# Patient Record
Sex: Male | Born: 1990
Health system: Southern US, Community
[De-identification: ages and names within clinical notes are randomized; demographics above are authoritative.]

## PROBLEM LIST (undated history)

## (undated) DIAGNOSIS — I2510 Atherosclerotic heart disease of native coronary artery without angina pectoris: Secondary | ICD-10-CM

## (undated) DIAGNOSIS — L400 Psoriasis vulgaris: Secondary | ICD-10-CM

## (undated) DIAGNOSIS — I10 Essential (primary) hypertension: Secondary | ICD-10-CM

## (undated) DIAGNOSIS — Z952 Presence of prosthetic heart valve: Secondary | ICD-10-CM

## (undated) DIAGNOSIS — E785 Hyperlipidemia, unspecified: Secondary | ICD-10-CM

## (undated) DIAGNOSIS — T7840XA Allergy, unspecified, initial encounter: Secondary | ICD-10-CM

## (undated) DIAGNOSIS — I3139 Other pericardial effusion (noninflammatory): Secondary | ICD-10-CM

## (undated) DIAGNOSIS — F419 Anxiety disorder, unspecified: Secondary | ICD-10-CM

## (undated) DIAGNOSIS — I719 Aortic aneurysm of unspecified site, without rupture: Secondary | ICD-10-CM

## (undated) DIAGNOSIS — I313 Pericardial effusion (noninflammatory): Secondary | ICD-10-CM

## (undated) HISTORY — DX: Hyperlipidemia, unspecified: E78.5

## (undated) HISTORY — DX: Other pericardial effusion (noninflammatory): I31.39

## (undated) HISTORY — PX: WISDOM TOOTH EXTRACTION: SHX21

## (undated) HISTORY — DX: Atherosclerotic heart disease of native coronary artery without angina pectoris: I25.10

## (undated) HISTORY — DX: Essential (primary) hypertension: I10

## (undated) HISTORY — PX: CLEFT LIP REPAIR: SHX5225

## (undated) HISTORY — DX: Anxiety disorder, unspecified: F41.9

## (undated) HISTORY — DX: Presence of prosthetic heart valve: Z95.2

## (undated) HISTORY — DX: Allergy, unspecified, initial encounter: T78.40XA

## (undated) HISTORY — DX: Pericardial effusion (noninflammatory): I31.3

## (undated) HISTORY — PX: CARDIAC SURGERY: SHX584

---
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
## 2012-07-26 ENCOUNTER — Emergency Department: Payer: Self-pay | Admitting: Emergency Medicine

## 2012-12-05 ENCOUNTER — Ambulatory Visit: Payer: Self-pay | Admitting: Gastroenterology

## 2012-12-07 LAB — PATHOLOGY REPORT

## 2013-03-29 ENCOUNTER — Ambulatory Visit: Payer: Self-pay | Admitting: Gastroenterology

## 2013-05-11 ENCOUNTER — Ambulatory Visit: Payer: Self-pay | Admitting: Gastroenterology

## 2013-06-30 ENCOUNTER — Emergency Department: Payer: Self-pay | Admitting: Emergency Medicine

## 2013-06-30 LAB — BASIC METABOLIC PANEL
Anion Gap: 11 (ref 7–16)
BUN: 8 mg/dL (ref 7–18)
Calcium, Total: 8.3 mg/dL — ABNORMAL LOW (ref 8.5–10.1)
Chloride: 105 mmol/L (ref 98–107)
Co2: 24 mmol/L (ref 21–32)
Creatinine: 0.96 mg/dL (ref 0.60–1.30)
EGFR (African American): >60
EGFR (Non-African Amer.): >60
Glucose: 156 mg/dL — ABNORMAL HIGH (ref 65–99)
Osmolality: 281 (ref 275–301)
Potassium: 3.4 mmol/L — ABNORMAL LOW (ref 3.5–5.1)
Sodium: 140 mmol/L (ref 136–145)

## 2013-06-30 LAB — CBC WITH DIFFERENTIAL/PLATELET
Basophil #: 0.1 10*3/uL (ref 0.0–0.1)
Basophil %: 0.5 %
Eosinophil #: 0.2 10*3/uL (ref 0.0–0.7)
Eosinophil %: 1.2 %
HCT: 50 % (ref 40.0–52.0)
HGB: 16.9 g/dL (ref 13.0–18.0)
Lymphocyte #: 4.8 10*3/uL — ABNORMAL HIGH (ref 1.0–3.6)
Lymphocyte %: 34.2 %
MCH: 31.2 pg (ref 26.0–34.0)
MCHC: 33.9 g/dL (ref 32.0–36.0)
MCV: 92 fL (ref 80–100)
Monocyte #: 1.1 x10 3/mm — ABNORMAL HIGH (ref 0.2–1.0)
Monocyte %: 7.8 %
Neutrophil #: 7.9 10*3/uL — ABNORMAL HIGH (ref 1.4–6.5)
Neutrophil %: 56.3 %
Platelet: 263 10*3/uL (ref 150–440)
RBC: 5.42 10*6/uL (ref 4.40–5.90)
RDW: 14 % (ref 11.5–14.5)
WBC: 14 10*3/uL — ABNORMAL HIGH (ref 3.8–10.6)

## 2014-11-12 ENCOUNTER — Ambulatory Visit (INDEPENDENT_AMBULATORY_CARE_PROVIDER_SITE_OTHER): Payer: 59 | Admitting: Unknown Physician Specialty

## 2014-11-12 ENCOUNTER — Encounter: Payer: Self-pay | Admitting: Unknown Physician Specialty

## 2014-11-12 VITALS — BP 123/87 | HR 112 | Temp 97.8°F | Ht 64.7 in | Wt 172.2 lb

## 2014-11-12 DIAGNOSIS — H6121 Impacted cerumen, right ear: Secondary | ICD-10-CM | POA: Diagnosis not present

## 2014-11-12 NOTE — Patient Instructions (Signed)
Cerumen Impaction °A cerumen impaction is when the wax in your ear forms a plug. This plug usually causes reduced hearing. Sometimes it also causes an earache or dizziness. Removing a cerumen impaction can be difficult and painful. The wax sticks to the ear canal. The canal is sensitive and bleeds easily. If you try to remove a heavy wax buildup with a cotton tipped swab, you may push it in further. °Irrigation with water, suction, and small ear curettes may be used to clear out the wax. If the impaction is fixed to the skin in the ear canal, ear drops may be needed for a few days to loosen the wax. People who build up a lot of wax frequently can use ear wax removal products available in your local drugstore. °SEEK MEDICAL CARE IF:  °You develop an earache, increased hearing loss, or marked dizziness. °Document Released: 06/18/2004 Document Revised: 08/03/2011 Document Reviewed: 08/08/2009 °ExitCare® Patient Information ©2015 [employer]. This information is not intended to replace advice given to you by your health care provider. Make sure you discuss any questions you have with your health care provider. ° °

## 2014-11-12 NOTE — Progress Notes (Signed)
   BP 123/87 mmHg  Pulse 112  Temp(Src) 97.8 F (36.6 C)  Ht 5' 4.7" (1.643 m)  Wt 172 lb 3.2 oz (78.109 kg)  BMI 28.94 kg/m2  SpO2 100%   Subjective:    Patient ID: Henry Duncan, male    DOB: 1991/03/23, 24 y.o.   MRN: [id number]  HPI: Henry Duncan is a 24 y.o. male  Chief Complaint  Patient presents with  . Ear Pain    pt states pain is in right ear  . Insomnia   Otalgia  There is pain in the right ear. Chronicity: for onemonth. The problem occurs constantly. The problem has been gradually worsening. There has been no fever. Associated symptoms include rhinorrhea. Pertinent negatives include no coughing or sore throat. Treatments tried: q tips.     Relevant past medical, surgical, family and social history reviewed and updated as indicated. Interim medical history since our last visit reviewed. Allergies and medications reviewed and updated.  Review of Systems  HENT: Positive for ear pain and rhinorrhea. Negative for sore throat.   Respiratory: Negative for cough.     Per HPI unless specifically indicated above     Objective:    BP 123/87 mmHg  Pulse 112  Temp(Src) 97.8 F (36.6 C)  Ht 5' 4.7" (1.643 m)  Wt 172 lb 3.2 oz (78.109 kg)  BMI 28.94 kg/m2  SpO2 100%  Wt Readings from Last 3 Encounters:  11/12/14 172 lb 3.2 oz (78.109 kg)  12/18/13 166 lb (75.297 kg)    Physical Exam  Constitutional: He is oriented to person, place, and time. He appears well-developed and well-nourished. No distress.  HENT:  Head: Normocephalic and atraumatic.  significant ceruman right ear.  Unable to irrigate without a lot of pain  Eyes: Conjunctivae and lids are normal. Right eye exhibits no discharge. Left eye exhibits no discharge. No scleral icterus.  Cardiovascular: Normal rate and regular rhythm.   Pulmonary/Chest: Effort normal. No respiratory distress.  Abdominal: Normal appearance and bowel sounds are normal. He exhibits no distension. There is no  splenomegaly or hepatomegaly. There is no tenderness.  Musculoskeletal: Normal range of motion.  Neurological: He is alert and oriented to person, place, and time.  Skin: Skin is intact. No rash noted. No pallor.  Psychiatric: He has a normal mood and affect. His behavior is normal. Judgment and thought content normal.     Assessment & Plan:   Problem List Items Addressed This Visit    None    Visit Diagnoses    Cerumen impaction, right    -  Primary        Follow up plan: Return in about 4 days (around 11/16/2014) for irrigation. after using debrox or baby oil

## 2014-11-16 ENCOUNTER — Encounter: Payer: Self-pay | Admitting: Unknown Physician Specialty

## 2014-11-16 ENCOUNTER — Ambulatory Visit (INDEPENDENT_AMBULATORY_CARE_PROVIDER_SITE_OTHER): Payer: 59 | Admitting: Unknown Physician Specialty

## 2014-11-16 VITALS — BP 135/92 | HR 102 | Temp 98.1°F | Wt 177.6 lb

## 2014-11-16 DIAGNOSIS — H6123 Impacted cerumen, bilateral: Secondary | ICD-10-CM

## 2014-11-16 NOTE — Progress Notes (Signed)
   BP 135/92 mmHg  Pulse 102  Temp(Src) 98.1 F (36.7 C) (Oral)  Wt 177 lb 9.6 oz (80.559 kg)  SpO2 96%   Subjective:    Patient ID: Henry Duncan, male    DOB: November 29, 1990, 24 y.o.   MRN: [id number]  HPI: Henry Duncan is a 24 y.o. male  Chief Complaint  Patient presents with  . Follow-up    Ear cleaning    Relevant past medical, surgical, family and social history reviewed and updated as indicated. Interim medical history since our last visit reviewed. Allergies and medications reviewed and updated.  Otalgia  There is pain in the right ear. This is a new (Seen last week and unable to clean out.  Used OTC debrox) problem. The current episode started more than 1 month ago. The problem occurs constantly. The problem has been unchanged.     Review of Systems  HENT: Positive for ear pain.     Per HPI unless specifically indicated above     Objective:    BP 135/92 mmHg  Pulse 102  Temp(Src) 98.1 F (36.7 C) (Oral)  Wt 177 lb 9.6 oz (80.559 kg)  SpO2 96%  Wt Readings from Last 3 Encounters:  11/16/14 177 lb 9.6 oz (80.559 kg)  11/12/14 172 lb 3.2 oz (78.109 kg)  12/18/13 166 lb (75.297 kg)    Physical Exam  Constitutional: He is oriented to person, place, and time. He appears well-developed and well-nourished. No distress.  HENT:  Head: Normocephalic and atraumatic.  Bilateral ceruman irrigated.   Eyes: Conjunctivae and lids are normal. Right eye exhibits no discharge. Left eye exhibits no discharge. No scleral icterus.  Cardiovascular: Normal rate and regular rhythm.   Pulmonary/Chest: Effort normal. No respiratory distress.  Abdominal: Normal appearance. There is no splenomegaly or hepatomegaly.  Musculoskeletal: Normal range of motion.  Neurological: He is alert and oriented to person, place, and time.  Skin: Skin is intact. No rash noted. No pallor.  Psychiatric: He has a normal mood and affect. His behavior is normal. Judgment and thought  content normal.      Assessment & Plan:   Problem List Items Addressed This Visit    None    Visit Diagnoses    Cerumen impaction, bilateral    -  Primary        Follow up plan: No Follow-up on file.

## 2014-12-12 ENCOUNTER — Encounter: Payer: 59 | Admitting: Unknown Physician Specialty

## 2015-02-01 ENCOUNTER — Ambulatory Visit: Payer: 59 | Admitting: Unknown Physician Specialty

## 2015-02-11 ENCOUNTER — Encounter: Payer: Self-pay | Admitting: Unknown Physician Specialty

## 2015-02-11 ENCOUNTER — Ambulatory Visit (INDEPENDENT_AMBULATORY_CARE_PROVIDER_SITE_OTHER): Payer: 59 | Admitting: Unknown Physician Specialty

## 2015-02-11 VITALS — BP 119/85 | HR 93 | Temp 98.6°F | Ht 66.5 in | Wt 176.2 lb

## 2015-02-11 DIAGNOSIS — F419 Anxiety disorder, unspecified: Secondary | ICD-10-CM | POA: Diagnosis not present

## 2015-02-11 DIAGNOSIS — K219 Gastro-esophageal reflux disease without esophagitis: Secondary | ICD-10-CM | POA: Diagnosis not present

## 2015-02-11 MED ORDER — CITALOPRAM HYDROBROMIDE 20 MG PO TABS: 20.0000 mg | ORAL_TABLET | Freq: Every day | ORAL | Status: DC

## 2015-02-11 MED ORDER — OMEPRAZOLE 20 MG PO CPDR: 20.0000 mg | DELAYED_RELEASE_CAPSULE | Freq: Every day | ORAL | Status: DC

## 2015-02-11 NOTE — Assessment & Plan Note (Signed)
Prescribed omeprazole 20 mg po daily H pylori screening ordered results pending CBC ordered results pending Instructed to avoid fatty foods and spicy foods

## 2015-02-11 NOTE — Assessment & Plan Note (Signed)
Prescribed Citalopram 20 mg daily PHQ-9 Depression Screening score today 6 Informed pt it may take up to 2 weeks for medication to improve symptoms

## 2015-02-11 NOTE — Progress Notes (Signed)
BP 119/85 mmHg  Pulse 93  Temp(Src) 98.6 F (37 C)  Ht 5' 6.5" (1.689 m)  Wt 176 lb 3.2 oz (79.924 kg)  BMI 28.02 kg/m2  SpO2 97%   Subjective:    Patient ID: Henry Duncan, male    DOB: 1991-01-26, 24 y.o.   MRN: [id number]  HPI: Henry Duncan is a 24 y.o. male  Chief Complaint  Patient presents with  . Abdominal Pain    pt states he used to have 4 stomach ulcers and has been throwing up for a while now  . Insomnia    pt states he can fall asleep but has trouble staying asleep. States sleep apnea runs in the family.   Abdominal Pain Pt presents to the office today with c/o intermittent 3-4 times a week of vomiting with occasional black/tarry particles in emesis and epigastric pain.  Quality of pain is aching onset 6 months ago. Aggravating factors include eating fatty foods and spicy food.  He is currently applying essential oils to stomach prn with relief of symptoms.  Pertinent negatives denies diarrhea, constipation, black/tarry stools, signs/symptoms of active bleeding.  He saw Gastrointestinal Nurse Practitioner 2 years ago, an endoscopy was performed he was told he had 4 stomach ulcers and was treated medically with omeprazole with minimal relief of symptoms.  He states his current symptoms are similar to the symptoms he had 2 years ago when he was diagnosed with 4 stomach ulcers.  He did not follow-up with his gastroenterologist due to his work schedule.  PHQ-9 score today 6  Insomnia Pt states he is able to fall asleep however has problems with staying asleep.  He wakes up several times during the night every night, and wakes up fatigued the next day.  He says he worries about everything and has been having increased anxiety. He has periods of decreased appetite secondary to anxiety.  He states his grandmother, dad, and brother have anxiety.  He has never taken anything for anxiety. Pertinent negatives denies depression, suicidal ideation, or  stressors  Relevant past medical, surgical, family and social history reviewed and updated as indicated. Interim medical history since our last visit reviewed. Allergies and medications reviewed and updated.   Review of Systems  Constitutional: Positive for appetite change and fatigue. Negative for fever, chills, diaphoresis and unexpected weight change.  Respiratory: Negative for cough, shortness of breath and wheezing.   Cardiovascular: Negative for chest pain and palpitations.  Gastrointestinal: Positive for abdominal pain. Negative for nausea, vomiting, diarrhea, constipation, blood in stool, anal bleeding and rectal pain.  Skin: Negative for color change, pallor and rash.  Neurological: Negative for dizziness, tremors, syncope, weakness, light-headedness, numbness and headaches.  Psychiatric/Behavioral: Positive for sleep disturbance. Negative for suicidal ideas, hallucinations, self-injury and decreased concentration. The patient is nervous/anxious. The patient is not hyperactive.     Per HPI unless specifically indicated above     Objective:    BP 119/85 mmHg  Pulse 93  Temp(Src) 98.6 F (37 C)  Ht 5' 6.5" (1.689 m)  Wt 176 lb 3.2 oz (79.924 kg)  BMI 28.02 kg/m2  SpO2 97%  Wt Readings from Last 3 Encounters:  02/11/15 176 lb 3.2 oz (79.924 kg)  11/16/14 177 lb 9.6 oz (80.559 kg)  11/12/14 172 lb 3.2 oz (78.109 kg)    Physical Exam  Constitutional: He is oriented to person, place, and time. He appears well-developed and well-nourished. No distress.  HENT:  Head: Normocephalic and atraumatic.  Right  Ear: External ear normal.  Left Ear: External ear normal.  Nose: Nose normal.  Neck: Normal range of motion. Neck supple.  Cardiovascular: Normal rate, regular rhythm and normal heart sounds.   Pulmonary/Chest: Effort normal and breath sounds normal. No respiratory distress. He has no wheezes. He exhibits no tenderness.  Abdominal: Soft. Bowel sounds are normal. He exhibits  no distension and no mass. There is no tenderness. There is no rebound and no guarding.  Musculoskeletal: Normal range of motion. He exhibits no edema or tenderness.  Lymphadenopathy:    He has no cervical adenopathy.  Neurological: He is alert and oriented to person, place, and time.  Skin: Skin is warm and dry. He is not diaphoretic.  Psychiatric: He has a normal mood and affect. His behavior is normal. Judgment and thought content normal.    Results for orders placed or performed in visit on 06/30/13  CBC with Differential/Platelet  Result Value Ref Range   WBC 14.0 (H) 3.8-10.6 x10 3/mm 3   RBC 5.42 4.40-5.90 x10 6/mm 3   HGB 16.9 13.0-18.0 g/dL   HCT 50.0 40.0-52.0 %   MCV 92 80-100 fL   MCH 31.2 26.0-34.0 pg   MCHC 33.9 32.0-36.0 g/dL   RDW 14.0 11.5-14.5 %   Platelet 263 150-440 x10 3/mm 3   Neutrophil % 56.3 %   Lymphocyte % 34.2 %   Monocyte % 7.8 %   Eosinophil % 1.2 %   Basophil % 0.5 %   Neutrophil # 7.9 (H) 1.4-6.5 x10 3/mm 3   Lymphocyte # 4.8 (H) 1.0-3.6 x10 3/mm 3   Monocyte # 1.1 (H) 0.2-1.0 x10 3/mm    Eosinophil # 0.2 0.0-0.7 x10 3/mm 3   Basophil # 0.1 0.0-0.1 x10 3/mm 3  Basic metabolic panel  Result Value Ref Range   Glucose 156 (H) 65-99 mg/dL   BUN 8 7-18 mg/dL   Creatinine 0.96 0.60-1.30 mg/dL   Sodium 140 136-145 mmol/L   Potassium 3.4 (L) 3.5-5.1 mmol/L   Chloride 105 98-107 mmol/L   Co2 24 21-32 mmol/L   Calcium, Total 8.3 (L) 8.5-10.1 mg/dL   Osmolality 281 275-301   Anion Gap 11 7-16   EGFR (African American) >60    EGFR (Non-African Amer.) >60       Assessment & Plan:   Problem List Items Addressed This Visit      Unprioritized   GERD (gastroesophageal reflux disease) - Primary    Prescribed omeprazole 20 mg po daily H pylori screening ordered results pending CBC ordered results pending Instructed to avoid fatty foods and spicy foods      Relevant Medications   omeprazole (PRILOSEC) 20 MG capsule   Other Relevant Orders    H.pylori screen, POC   CBC with Differential/Platelet   Helicobacter pylori abs-IgG+IgA, bld   HELICOBACTER PYLORI  ANTIBODY, IGM   Acute anxiety    Prescribed Citalopram 20 mg daily PHQ-9 Depression Screening score today 6 Informed pt it may take up to 2 weeks for medication to improve symptoms        Relevant Medications   citalopram (CELEXA) 20 MG tablet       Follow up plan: Return in about 4 weeks (around 03/11/2015).

## 2015-02-12 ENCOUNTER — Encounter: Payer: Self-pay | Admitting: Unknown Physician Specialty

## 2015-02-12 LAB — CBC WITH DIFFERENTIAL/PLATELET
Basophils Absolute: 0 10*3/uL (ref 0.0–0.2)
Basos: 0 %
EOS (ABSOLUTE): 0.2 10*3/uL (ref 0.0–0.4)
Eos: 3 %
Hematocrit: 46.7 % (ref 37.5–51.0)
Hemoglobin: 16.2 g/dL (ref 12.6–17.7)
Immature Grans (Abs): 0 10*3/uL (ref 0.0–0.1)
Immature Granulocytes: 0 %
Lymphocytes Absolute: 2 10*3/uL (ref 0.7–3.1)
Lymphs: 28 %
MCH: 31.9 pg (ref 26.6–33.0)
MCHC: 34.7 g/dL (ref 31.5–35.7)
MCV: 92 fL (ref 79–97)
Monocytes Absolute: 0.6 10*3/uL (ref 0.1–0.9)
Monocytes: 9 %
Neutrophils Absolute: 4.2 10*3/uL (ref 1.4–7.0)
Neutrophils: 60 %
Platelets: 230 10*3/uL (ref 150–379)
RBC: 5.08 x10E6/uL (ref 4.14–5.80)
RDW: 13.7 % (ref 12.3–15.4)
WBC: 7 10*3/uL (ref 3.4–10.8)

## 2015-02-12 LAB — HELICOBACTER PYLORI  ANTIBODY, IGM: H pylori, IgM Abs: <9 units (ref 0.0–8.9)

## 2015-02-13 LAB — HELICOBACTER PYLORI ABS-IGG+IGA, BLD: H. pylori, IgA Abs: <9 units (ref 0.0–8.9)

## 2015-03-13 ENCOUNTER — Encounter: Payer: Self-pay | Admitting: Unknown Physician Specialty

## 2015-03-13 ENCOUNTER — Ambulatory Visit (INDEPENDENT_AMBULATORY_CARE_PROVIDER_SITE_OTHER): Payer: 59 | Admitting: Unknown Physician Specialty

## 2015-03-13 ENCOUNTER — Ambulatory Visit: Payer: 59 | Admitting: Unknown Physician Specialty

## 2015-03-13 VITALS — BP 108/75 | HR 87 | Temp 98.3°F | Ht 64.8 in | Wt 175.2 lb

## 2015-03-13 DIAGNOSIS — R1110 Vomiting, unspecified: Secondary | ICD-10-CM

## 2015-03-13 DIAGNOSIS — K219 Gastro-esophageal reflux disease without esophagitis: Secondary | ICD-10-CM

## 2015-03-13 DIAGNOSIS — H9203 Otalgia, bilateral: Secondary | ICD-10-CM | POA: Diagnosis not present

## 2015-03-13 DIAGNOSIS — R112 Nausea with vomiting, unspecified: Secondary | ICD-10-CM | POA: Diagnosis not present

## 2015-03-13 DIAGNOSIS — F419 Anxiety disorder, unspecified: Secondary | ICD-10-CM | POA: Diagnosis not present

## 2015-03-13 NOTE — Assessment & Plan Note (Signed)
Will stop Citalopram instructed pt to continue Remeron Placed order for Ambulatory Referral to Psychiatry

## 2015-03-13 NOTE — Progress Notes (Signed)
BP 108/75 mmHg  Pulse 87  Temp(Src) 98.3 F (36.8 C)  Ht 5' 4.8" (1.646 m)  Wt 175 lb 3.2 oz (79.47 kg)  BMI 29.33 kg/m2  SpO2 97%   Subjective:    Patient ID: Henry Duncan, male    DOB: 16-Sep-1990, 24 y.o.   MRN: [id number]  HPI: Henry Duncan is a 24 y.o. male  Chief Complaint  Patient presents with  . Gastroesophageal Reflux  . Anxiety  . Ear Fullness    pt states his ears hurt and they fill full. States they started feeling this way a few days ago.   GERD The pt presents to the office today for follow-up of GERD he states his symptoms are about the same if not worse.  He is vomiting every other day at times every day with occasional black/tarry particles in the emesis, intermittent diarrhea and epigastric pain.  Quality of pain is aching.  Aggravating factors eating fatty foods and spicy foods he has been trying to avoid these foods.  He is still applying essential oils to his stomach with slight relief of symptoms.  Pertinent negatives denies black/tarry stools, constipation, or s/s of active bleeding.   Anxiety/Insominia The pt presents to the office today with c/o constant worry resulting in insomnia and increased irritability.  He states "I worry about small and big things I already have gray hairs and that's not good." This has worsened in the last year or two.  He does not think the medication is helping.  On average he gets 4 to 5 hrs of sleep at night and is fatigued during the day.  Alleviating factors include work it helps him focus on other things.  Generalized Anxiety Disorder 7-item scale 15 it is somewhat difficult to do his work or take care of things at home.  Pertinent negatives denies stressors, depression, suicidal ideation, or confusion   Ear Fullness The pt presents with c/o bilateral ear pain, ear fullness, muffled hearing and tinnitus onset 6 days ago.  He has attempted to clean his ears with water and washcloth without relief of symptoms.   He was sick last week with a cold.  Pertinent negatives syncope, nasal congestion, chest congestion, sinus pain or pressure, headaches, or dizziness/lightheadedness   Relevant past medical, surgical, family and social history reviewed and updated as indicated. Interim medical history since our last visit reviewed. Allergies and medications reviewed and updated.  Review of Systems  Constitutional: Positive for appetite change and fatigue. Negative for fever, chills, diaphoresis, activity change and unexpected weight change.  HENT: Positive for ear pain. Negative for congestion, dental problem, ear discharge, postnasal drip, rhinorrhea, sinus pressure, sneezing, sore throat, tinnitus and trouble swallowing.   Eyes: Negative.   Respiratory: Negative.   Cardiovascular: Negative.   Gastrointestinal: Positive for nausea, vomiting, abdominal pain and diarrhea. Negative for constipation, blood in stool, abdominal distention, anal bleeding and rectal pain.  Endocrine: Negative.   Genitourinary: Negative.   Musculoskeletal: Negative.   Skin: Negative.   Allergic/Immunologic: Negative.   Neurological: Negative.   Hematological: Negative.   Psychiatric/Behavioral: Positive for sleep disturbance. Negative for suicidal ideas, hallucinations, behavioral problems, confusion, self-injury, dysphoric mood, decreased concentration and agitation. The patient is nervous/anxious. The patient is not hyperactive.     Per HPI unless specifically indicated above     Objective:    BP 108/75 mmHg  Pulse 87  Temp(Src) 98.3 F (36.8 C)  Ht 5' 4.8" (1.646 m)  Wt 175 lb 3.2  oz (79.47 kg)  BMI 29.33 kg/m2  SpO2 97%  Wt Readings from Last 3 Encounters:  03/13/15 175 lb 3.2 oz (79.47 kg)  02/11/15 176 lb 3.2 oz (79.924 kg)  11/16/14 177 lb 9.6 oz (80.559 kg)    Physical Exam  Constitutional: He is oriented to person, place, and time. He appears well-developed and well-nourished. No distress.  HENT:  Head:  Normocephalic and atraumatic.  Right Ear: External ear and ear canal normal. No drainage, swelling or tenderness. No middle ear effusion.  Left Ear: Tympanic membrane, external ear and ear canal normal. No swelling or tenderness.  No middle ear effusion.  Nose: Nose normal.  Mouth/Throat: Oropharynx is clear and moist. No oropharyngeal exudate.  Neck: Normal range of motion. Neck supple.  Cardiovascular: Normal rate, regular rhythm, normal heart sounds and intact distal pulses.   Pulmonary/Chest: Effort normal and breath sounds normal.  Abdominal: Soft. Bowel sounds are normal. He exhibits no distension and no mass. There is no rebound and no guarding.  Left lower quadrant abdominal tenderness   Musculoskeletal: Normal range of motion. He exhibits no edema or tenderness.  Lymphadenopathy:    He has no cervical adenopathy.  Neurological: He is alert and oriented to person, place, and time.  Skin: Skin is warm and dry. No rash noted. He is not diaphoretic. No erythema. No pallor.  Psychiatric: He has a normal mood and affect. His behavior is normal. Judgment and thought content normal.        Assessment & Plan:   Problem List Items Addressed This Visit      Unprioritized   GERD (gastroesophageal reflux disease)    Omeprazole is not working placed order for Ambulatory Referral to Gastroenterology      Acute anxiety    Will stop Citalopram instructed pt to continue Remeron Placed order for Ambulatory Referral to Psychiatry       Other Visit Diagnoses    Chronic anxiety    -  Primary    Relevant Orders    Ambulatory referral to Psychiatry    Chronic vomiting        Relevant Orders    Ambulatory referral to Gastroenterology    Ear discomfort, bilateral        Had upper respiratory infection 1 week ago instructed pt to take otc Flonase 2 sprays each nostril         Follow up plan: Return if symptoms worsen or fail to improve.

## 2015-03-13 NOTE — Assessment & Plan Note (Signed)
Omeprazole is not working placed order for Ambulatory Referral to Gastroenterology

## 2015-04-11 ENCOUNTER — Ambulatory Visit: Payer: 59 | Admitting: Psychiatry

## 2015-05-03 ENCOUNTER — Other Ambulatory Visit: Payer: Self-pay

## 2015-05-06 ENCOUNTER — Encounter: Payer: Self-pay | Admitting: Gastroenterology

## 2015-05-06 ENCOUNTER — Encounter (INDEPENDENT_AMBULATORY_CARE_PROVIDER_SITE_OTHER): Payer: Self-pay

## 2015-05-06 ENCOUNTER — Ambulatory Visit (INDEPENDENT_AMBULATORY_CARE_PROVIDER_SITE_OTHER): Payer: 59 | Admitting: Gastroenterology

## 2015-05-06 ENCOUNTER — Encounter: Payer: Self-pay | Admitting: Psychiatry

## 2015-05-06 ENCOUNTER — Ambulatory Visit (INDEPENDENT_AMBULATORY_CARE_PROVIDER_SITE_OTHER): Payer: 59 | Admitting: Psychiatry

## 2015-05-06 VITALS — BP 112/71 | HR 100 | Temp 98.3°F | Ht 64.0 in | Wt 172.0 lb

## 2015-05-06 VITALS — BP 110/80 | HR 117 | Temp 97.8°F | Ht 66.0 in | Wt 174.8 lb

## 2015-05-06 DIAGNOSIS — F411 Generalized anxiety disorder: Secondary | ICD-10-CM | POA: Diagnosis not present

## 2015-05-06 DIAGNOSIS — R109 Unspecified abdominal pain: Secondary | ICD-10-CM | POA: Diagnosis not present

## 2015-05-06 DIAGNOSIS — R1115 Cyclical vomiting syndrome unrelated to migraine: Secondary | ICD-10-CM

## 2015-05-06 DIAGNOSIS — G43A0 Cyclical vomiting, not intractable: Secondary | ICD-10-CM

## 2015-05-06 MED ORDER — SERTRALINE HCL 50 MG PO TABS: ORAL_TABLET | ORAL | Status: DC

## 2015-05-06 MED ORDER — TRAZODONE HCL 50 MG PO TABS: ORAL_TABLET | ORAL | Status: DC

## 2015-05-06 NOTE — Progress Notes (Signed)
Psychiatric Initial Adult Assessment   Patient Identification: Henry Duncan MRN:  [id number] Date of Evaluation:  05/06/2015 Referral Source: Primary care physician Chief Complaint:   "I am a little nervous"" Visit Diagnosis:    ICD-9-CM ICD-10-CM   1. GAD (generalized anxiety disorder) 300.02 F41.1    Diagnosis:   Patient Active Problem List   Diagnosis Date Noted  . GERD (gastroesophageal reflux disease) [K21.9] 02/11/2015  . Acute anxiety [F41.9] 02/11/2015   History of Present Illness:  Patient indicates for the past 2 years he's been having issues over anxiety. He states "things get to me." He states that his anxiety causes him to have difficulty sleeping. He states that things run through his mind at night which keep him up. He states that if one thing happens he worries that it's going to cause something bad happened. For example he states that he is a minute late for work he is very worried about the consequences of it. He states that he has nausea secondary to his anxiety. He also has been diagnosed with ulcers but states that his nausea comes with his anxiety. He states he feels his heart beating and has an application on his phone where he checks his heart rate frequently. He states he'll wake up to his nausea. He states then he is sometimes badgered by his father about not being school and this makes him worried and nervous.  His primary care physician has prescribed him some mirtazapine at night which he states did not help his insomnia. Also in the records it appears his primary care prescribe citalopram however patient does not recall taking this medication.  Asian denies any symptoms consistent with a major depressive episode. He denies any symptoms consistent with mania. He denies any symptoms consistent with a psychotic illness. Elements:  Duration:  As discussed above. Associated Signs/Symptoms: Depression Symptoms:  anxiety, (Hypo) Manic Symptoms:  None Anxiety  Symptoms:  Excessive Worry, Psychotic Symptoms:  None PTSD Symptoms: Negative  Past Medical History: No past medical history on file.  Past Surgical History  Procedure Laterality Date  . Cleft lip repair     Family History:  Family History  Problem Relation Age of Onset  . Heart attack Mother    Social History:   Social History   Social History  . Marital Status: Single    Spouse Name: N/A  . Number of Children: N/A  . Years of Education: N/A   Social History Main Topics  . Smoking status: Never Smoker   . Smokeless tobacco: Never Used  . Alcohol Use: 0.0 oz/week    0 Standard drinks or equivalent per week     Comment: pt states on occasion  . Drug Use: No  . Sexual Activity: Yes    Birth Control/ Protection: Condom   Other Topics Concern  . Not on file   Social History Narrative   Additional Social History:   Patient states he had a very sheltered and good childhood. He states his mother and father within the home. He states he is the youngest and has a brother and a sister. He states he had a "great childhood." He states he was close to his sister and then when his sister left the home he became close to his brother. He states that he had a average between a B in [employer]. He states he never repeated any grades have any special education. He states he currently lives at home with his parents. He has been working  at a grocery store for the past 2 years. He states he does have plans to go to community college in January.    Musculoskeletal: Strength & Muscle Tone: within normal limits Gait & Station: normal Patient leans: N/A  Psychiatric Specialty Exam: HPI  Review of Systems  Psychiatric/Behavioral: Negative for depression, suicidal ideas, hallucinations, memory loss and substance abuse. The patient is nervous/anxious and has insomnia.   All other systems reviewed and are negative.   There were no vitals taken for this visit.There is no weight on file to calculate  BMI.  General Appearance: Well Groomed  Eye Contact:  Good  Speech:  Normal Rate  Volume:  Normal  Mood:  Nervous  Affect:  mood congruent, able to smile  Thought Process:  Negative  Orientation:  Full (Time, Place, and Person)  Thought Content:  Negative  Suicidal Thoughts:  No  Homicidal Thoughts:  No  Memory:  Immediate;   Good Recent;   Good Remote;   Good  Judgement:  Good  Insight:  Good  Psychomotor Activity:  Normal  Concentration:  Good  Recall:  Good  Fund of Knowledge:Good  Language: Good  Akathisia:  Negative  Handed:    AIMS (if indicated):    Assets:  Communication Skills Desire for Improvement Housing Social Support Vocational/Educational  ADL's:  Intact  Cognition: WNL  Sleep:  poor   Is the patient at risk to self?  No. Has the patient been a risk to self in the past 6 months?  No. Has the patient been a risk to self within the distant past?  No. Is the patient a risk to others?  No. Has the patient been a risk to others in the past 6 months?  No. Has the patient been a risk to others within the distant past?  No.  Allergies:   Allergies  Allergen Reactions  . Eggs Or Egg-Derived Products   . Flu Virus Vaccine    Current Medications: Current Outpatient Prescriptions  Medication Sig Dispense Refill  . cetirizine-pseudoephedrine (ZYRTEC-D) 5-120 MG per tablet Take 1 tablet by mouth 2 (two) times daily.    Marland Kitchen omeprazole (PRILOSEC) 20 MG capsule Take 1 capsule (20 mg total) by mouth daily. 30 capsule 3  . sertraline (ZOLOFT) 50 MG tablet Take one half a tablet in  The morning  For seven days then increase to one whole tablet in the morning. 30 tablet 1  . traZODone (DESYREL) 50 MG tablet Take one to two tablets as needed at bedtime for sleep. 60 tablet 1   No current facility-administered medications for this visit.    Previous Psychotropic Medications: Yes   Substance Abuse History in the last 12 months:  No. Patient states that from age 56 up  until last year he might drink twice a week. He states that at these times he might share a 24 pack. He relates over the past year he really has refrained from drinking because of his ulcers. He states occasional marijuana once or twice a month. Denies use of any other drugs. Consequences of Substance Abuse: Negative  Medical Decision Making:  New Problem, with no additional work-up planned (3), Review of Medication Regimen & Side Effects (2) and Review of New Medication or Change in Dosage (2)  Treatment Plan Summary: Medication management and Plan   Generalized anxiety disorder-we will start some sertraline 25 mg in the morning for 7 days and then will increase to 50 mg in the morning. Risk and benefits  of been discussed and patient is able to consent.   Insomnia-this appears to be secondary to his anxiety. We will start some trazodone 50-100 mg at bedtime as needed for insomnia. Risk and benefits including priapism have been discussed with patient.   Patient will follow up in 1 month.   He's been encouraged colony questions concerns prior to his next point. In regards to risk assessment the patient has risk factors of gender, race, anxiety and age. He has protective factors of no past suicide attempts, no depression, forward thinking, good social supports, no past suicide attempts and engaged in treatment. At this time low risk of imminent harm to himself or others.    Faith Rogue 12/12/20169:52 AM

## 2015-05-06 NOTE — Progress Notes (Signed)
Gastroenterology Consultation  Referring Provider:     Kathrine Haddock, NP Primary Care Physician:  Kathrine Haddock, NP Primary Gastroenterologist:  Dr. Allen Norris     Reason for Consultation:     Nausea and vomiting        HPI:   Henry Duncan is a 24 y.o. y/o male referred for consultation & management of  Nausea and vomiting by Dr. Kathrine Haddock, NP.  This patient comes today with a long history of nausea and vomiting. The patient reports that he has had this in the past and had an upper endoscopy that showed mild gastritis. The patient reports that his nausea and vomiting may come once a week or a few times a week. There is no report of any black stools or bloody stools. He also states that he is not helped by his omeprazole. When he does vomiting he reports that he has a burning of acid in his mouth after vomiting. There is no report of any weight loss, fevers or chills. The patient suffers from anxiety and takes medication for this. There is no food that he reports makes his symptoms any better or worse. He has not had his gallbladder removed in the past.  Past Medical History  Diagnosis Date  . Anxiety     Past Surgical History  Procedure Laterality Date  . Cleft lip repair      Prior to Admission medications   Medication Sig Start Date End Date Taking? Authorizing Provider  cetirizine-pseudoephedrine (ZYRTEC-D) 5-120 MG per tablet Take 1 tablet by mouth 2 (two) times daily.   Yes Historical Provider, MD  omeprazole (PRILOSEC) 20 MG capsule Take 1 capsule (20 mg total) by mouth daily. 02/11/15  Yes Kathrine Haddock, NP  sertraline (ZOLOFT) 50 MG tablet Take one half a tablet in  The morning  For seven days then increase to one whole tablet in the morning. 05/06/15  Yes Marjie Skiff, MD  traZODone (DESYREL) 50 MG tablet Take one to two tablets as needed at bedtime for sleep. 05/06/15  Yes Marjie Skiff, MD    Family History  Problem Relation Age of Onset  . Heart attack  Mother   . Anxiety disorder Father   . Diabetes Father   . Depression Brother   . Anxiety disorder Brother      Social History  Substance Use Topics  . Smoking status: Never Smoker   . Smokeless tobacco: Never Used  . Alcohol Use: 0.0 oz/week    0 Standard drinks or equivalent per week     Comment: pt states on occasion    Allergies as of 05/06/2015 - Review Complete 05/06/2015  Allergen Reaction Noted  . Eggs or egg-derived products  11/12/2014  . Flu virus vaccine  11/12/2014    Review of Systems:    All systems reviewed and negative except where noted in HPI.   Physical Exam:  BP 112/71 mmHg  Pulse 100  Temp(Src) 98.3 F (36.8 C) (Oral)  Ht 5\' 4"  (1.626 m)  Wt 172 lb (78.019 kg)  BMI 29.51 kg/m2 No LMP for male patient. Psych:  Alert and cooperative. Normal mood and affect. General:   Alert,  Well-developed, well-nourished, pleasant and cooperative in NAD Head:  Normocephalic and atraumatic. Eyes:  Sclera clear, no icterus.   Conjunctiva pink. Ears:  Normal auditory acuity. Nose:  No deformity, discharge, or lesions. Mouth:  No deformity or lesions,oropharynx pink & moist. Neck:  Supple; no masses or thyromegaly. Lungs:  Respirations even and unlabored.  Clear throughout to auscultation.   No wheezes, crackles, or rhonchi. No acute distress. Heart:  Regular rate and rhythm; no murmurs, clicks, rubs, or gallops. Abdomen:  Normal bowel sounds.  No bruits.  Soft, non-tender and non-distended without masses, hepatosplenomegaly or hernias noted.  No guarding or rebound tenderness.  Negative Carnett sign.   Rectal:  Deferred.  Msk:  Symmetrical without gross deformities.  Good, equal movement & strength bilaterally. Pulses:  Normal pulses noted. Extremities:  No clubbing or edema.  No cyanosis. Neurologic:  Alert and oriented x3;  grossly normal neurologically. Skin:  Intact without significant lesions or rashes.  No jaundice. Lymph Nodes:  No significant cervical  adenopathy. Psych:  Alert and cooperative. Normal mood and affect.  Imaging Studies: No results found.  Assessment and Plan:   Henry Duncan is a 24 y.o. y/o male Who has recovered nausea and vomiting.   The patient has been explained that there are many causes of nausea and vomiting that are not related to the G.I. tract. If it is from the G.I. Tract is likely from gallbladder disease versus reflux versus peptic ulcer disease.The patient will be started on a trial  of Dexilant. He will also be set up for a right upper quadrant ultrasound. The patient has been explained the plan and agrees with it.   Note: This dictation was prepared with Dragon dictation along with smaller phrase technology. Any transcriptional errors that result from this process are unintentional.

## 2015-06-04 ENCOUNTER — Ambulatory Visit: Payer: 59 | Admitting: Psychiatry

## 2015-06-10 ENCOUNTER — Ambulatory Visit (INDEPENDENT_AMBULATORY_CARE_PROVIDER_SITE_OTHER): Payer: 59 | Admitting: Psychiatry

## 2015-06-10 ENCOUNTER — Encounter: Payer: Self-pay | Admitting: Psychiatry

## 2015-06-10 VITALS — BP 118/78 | HR 71 | Temp 98.3°F | Ht 64.0 in | Wt 178.4 lb

## 2015-06-10 DIAGNOSIS — F411 Generalized anxiety disorder: Secondary | ICD-10-CM | POA: Diagnosis not present

## 2015-06-10 MED ORDER — CLONAZEPAM 0.5 MG PO TABS: 0.5000 mg | ORAL_TABLET | Freq: Two times a day (BID) | ORAL | Status: DC

## 2015-06-10 MED ORDER — VENLAFAXINE HCL ER 37.5 MG PO CP24: ORAL_CAPSULE | ORAL | Status: DC

## 2015-06-10 MED ORDER — ZOLPIDEM TARTRATE 5 MG PO TABS: 5.0000 mg | ORAL_TABLET | Freq: Every evening | ORAL | Status: DC | PRN

## 2015-06-10 NOTE — Progress Notes (Signed)
BH MD/PA/NP OP Progress Note  06/10/2015 3:53 PM Henry Duncan  MRN:  [id number]  Subjective:  Patient returns for follow-up of his generalized anxiety disorder. He states he's feeling worse than he was when he came to see me last month. He states the trazodone did not help him sleep. He states he's never had trouble falling asleep but that the issue was waking up anxious. He states even the 100 mg did not him. He indicates that he has not noticed any difference with the sertraline. I discussed that he is at a low dose of the sertraline and that the option would be to increase his dose however he was pretty frustrated with sertraline.  He has been given a previous trial of citalopram per the notes. He is also recently tried some sertraline with no benefit. Given his frustration with the medicine and being on 2 medicines in his class were going to try Effexor.  He indicates he's not having any uses with depression and is just his anxiety. He states that he can feel his heart racing. He states it can be prompted by being in a conversation with someone. He states that he wakes up worried about things such as did he put things in the dryer, did he set his alarm. He states it's largely with him all day. Chief Complaint: Worse Chief Complaint    Panic Attack; Anxiety; Follow-up; Medication Refill; Stress     Visit Diagnosis:     ICD-9-CM ICD-10-CM   1. GAD (generalized anxiety disorder) 300.02 F41.1     Past Medical History:  Past Medical History  Diagnosis Date  . Anxiety     Past Surgical History  Procedure Laterality Date  . Cleft lip repair     Family History:  Family History  Problem Relation Age of Onset  . Heart attack Mother   . Anxiety disorder Father   . Diabetes Father   . Depression Brother   . Anxiety disorder Brother    Social History:  Social History   Social History  . Marital Status: Single    Spouse Name: N/A  . Number of Children: N/A  . Years of  Education: N/A   Social History Main Topics  . Smoking status: Never Smoker   . Smokeless tobacco: Never Used  . Alcohol Use: No     Comment: pt states on occasion  . Drug Use: No  . Sexual Activity: Yes    Birth Control/ Protection: Condom   Other Topics Concern  . None   Social History Narrative   Additional History:   Assessment:   Musculoskeletal: Strength & Muscle Tone: within normal limits Gait & Station: normal Patient leans: N/A  Psychiatric Specialty Exam: HPI  Review of Systems  Psychiatric/Behavioral: Negative for depression, suicidal ideas, hallucinations, memory loss and substance abuse. The patient is nervous/anxious and has insomnia.   All other systems reviewed and are negative.   Blood pressure 118/78, pulse 71, temperature 98.3 F (36.8 C), temperature source Tympanic, height 5\' 4"  (1.626 m), weight 178 lb 6.4 oz (80.922 kg), SpO2 98 %.Body mass index is 30.61 kg/(m^2).  General Appearance: Well Groomed  Eye Contact:  Good  Speech:  Normal Rate  Volume:  Normal  Mood:  Worse  Affect:  anxious and tearful  Thought Process:  Linear and Logical  Orientation:  Full (Time, Place, and Person)  Thought Content:  Negative  Suicidal Thoughts:  No  Homicidal Thoughts:  No  Memory:  Immediate;  Good Recent;   Good Remote;   Good  Judgement:  Good  Insight:  Good  Psychomotor Activity:  Negative  Concentration:  Good  Recall:  Good  Fund of Knowledge: Good  Language: Good  Akathisia:  Negative  Handed:   AIMS (if indicated):    Assets:  Communication Skills Desire for Improvement Social Support  ADL's:  Intact  Cognition: WNL  Sleep:  Poor, early wakening wakes up in the middle of the night    Is the patient at risk to self?  No. Has the patient been a risk to self in the past 6 months?  No. Has the patient been a risk to self within the distant past?  No. Is the patient a risk to others?  No. Has the patient been a risk to others in the past  6 months?  No. Has the patient been a risk to others within the distant past?  No.  Current Medications: Current Outpatient Prescriptions  Medication Sig Dispense Refill  . cetirizine-pseudoephedrine (ZYRTEC-D) 5-120 MG per tablet Take 1 tablet by mouth 2 (two) times daily.    . clonazePAM (KLONOPIN) 0.5 MG tablet Take 1 tablet (0.5 mg total) by mouth 2 (two) times daily. 60 tablet 1  . omeprazole (PRILOSEC) 20 MG capsule Take 1 capsule (20 mg total) by mouth daily. (Patient not taking: Reported on 06/10/2015) 30 capsule 3  . venlafaxine XR (EFFEXOR-XR) 37.5 MG 24 hr capsule Take one tablet in the morning for seven days then increase to two tablets in the morning. 60 capsule 1  . zolpidem (AMBIEN) 5 MG tablet Take 1 tablet (5 mg total) by mouth at bedtime as needed for sleep. 30 tablet 1   No current facility-administered medications for this visit.    Medical Decision Making:  Established Problem, Worsening (2), Review of Medication Regimen & Side Effects (2) and Review of New Medication or Change in Dosage (2)  Treatment Plan Summary:Medication management and Plan Plan Generalized anxiety disorder-patient is on a low dose of sertraline which is not providing him any benefit, however he does not want to sit and wait if to see if increased dose will help. Given that he's had a trial of sertraline and per history been on some citalopram we are going to try Effexor XR 37.5 mg for 7 days and then go to 75 mg in the morning.  Insomnia-did not benefit from trazodone at all. We are going to try some Ambien 5 mg at bedtime as needed. Risk and benefits have been discussed and patient is able to consent.  Made patient aware of my departure in February and that he will be able to transition to another provider within this practice. He will see me again prior to my departure in February in follow-up.   Faith Rogue 06/10/2015, 3:53 PM

## 2015-06-21 ENCOUNTER — Ambulatory Visit: Payer: Self-pay | Admitting: Unknown Physician Specialty

## 2015-06-28 ENCOUNTER — Ambulatory Visit: Payer: Self-pay | Admitting: Unknown Physician Specialty

## 2015-07-01 ENCOUNTER — Ambulatory Visit: Payer: 59 | Admitting: Psychiatry

## 2015-10-25 ENCOUNTER — Encounter: Payer: Self-pay | Admitting: Sports Medicine

## 2015-10-25 ENCOUNTER — Ambulatory Visit (INDEPENDENT_AMBULATORY_CARE_PROVIDER_SITE_OTHER): Payer: 59 | Admitting: Sports Medicine

## 2015-10-25 DIAGNOSIS — M79675 Pain in left toe(s): Secondary | ICD-10-CM

## 2015-10-25 DIAGNOSIS — L600 Ingrowing nail: Secondary | ICD-10-CM | POA: Diagnosis not present

## 2015-10-25 NOTE — Progress Notes (Signed)
Patient ID: Henry Duncan, male   DOB: February 08, 1991, 25 y.o.   MRN: [id number] Subjective: Henry Duncan is a 25 y.o. male patient presents to office today complaining of a painful incurvated, red, hot, swollen medial nail border of the 1st toe on the left foot. This has been present for >2 weeks. Patient has treated this by trimming and soaking. Patient denies fever/chills/nausea/vomitting/any other related constitutional symptoms at this time.  Patient Active Problem List   Diagnosis Date Noted  . GERD (gastroesophageal reflux disease) 02/11/2015  . Acute anxiety 02/11/2015  . TENDINITIS TIBIALIS 10/24/2007    Current Outpatient Prescriptions on File Prior to Visit  Medication Sig Dispense Refill  . cetirizine-pseudoephedrine (ZYRTEC-D) 5-120 MG per tablet Take 1 tablet by mouth 2 (two) times daily.    . clonazePAM (KLONOPIN) 0.5 MG tablet Take 1 tablet (0.5 mg total) by mouth 2 (two) times daily. 60 tablet 1  . omeprazole (PRILOSEC) 20 MG capsule Take 1 capsule (20 mg total) by mouth daily. (Patient not taking: Reported on 06/10/2015) 30 capsule 3  . venlafaxine XR (EFFEXOR-XR) 37.5 MG 24 hr capsule Take one tablet in the morning for seven days then increase to two tablets in the morning. 60 capsule 1  . zolpidem (AMBIEN) 5 MG tablet Take 1 tablet (5 mg total) by mouth at bedtime as needed for sleep. 30 tablet 1   No current facility-administered medications on file prior to visit.    Allergies  Allergen Reactions  . Eggs Or Egg-Derived Products   . Flu Virus Vaccine     Objective:  There were no vitals filed for this visit.  General: Well developed, nourished, in no acute distress, alert and oriented x3   Dermatology: Skin is warm, dry and supple bilateral. Left hallux nail appears to be  severely incurvated with hyperkeratosis formation at the distal aspects of  the medial nail border. (+) Erythema. (+) Edema. (-) serosanguous  drainage present. The remaining nails  appear unremarkable at this time. There are no open sores, lesions or other signs of infection present.  Vascular: Dorsalis Pedis artery and Posterior Tibial artery pedal pulses are 2/4 bilateral with immedate capillary fill time. Pedal hair growth present. No lower extremity edema.   Neruologic: Grossly intact via light touch bilateral.  Musculoskeletal: Tenderness to palpation of the Left medial hallux nail fold. Muscular strength within normal limits in all groups bilateral.   Assesement and Plan: Problem List Items Addressed This Visit    None    Visit Diagnoses    Ingrown toenail    -  Primary    Left hallux medial margin     Pain in toe of left foot           -Discussed treatment alternatives and plan of care; Explained permanent/temporary nail avulsion and post procedure course to patient. Patient opt for PNA left hallux medial margin - After a verbal consent, injected 3 ml of a 50:50 mixture of 2% plain  lidocaine and 0.5% plain marcaine in a normal hallux block fashion. Next, a  betadine prep was performed. Anesthesia was tested and found to be appropriate.  The offending Left hallux medial nail border was then incised from the hyponychium to the epinychium. The offending nail border was removed and cleared from the field. The area was curretted for any remaining nail or spicules. Phenol application performed and the area was then flushed with alcohol and dressed with antibiotic cream and a dry sterile dressing. -Patient was instructed  to leave the dressing intact for today and begin soaking  in a weak solution of betadine and water tomorrow. Patient was instructed to  soak for 15 minutes each day and apply neosporin and a gauze or bandaid dressing each day. -Patient was instructed to monitor the toe for signs of infection and return to office if toe becomes red, hot or swollen. -Advised ice, elevation, and tylenol or motrin if needed for pain.  -Work excuse given for today   -Patient is to return in 1-2 weeks for follow up care or sooner if problems arise.  Landis Martins, DPM

## 2015-10-25 NOTE — Patient Instructions (Signed)

## 2015-10-29 ENCOUNTER — Encounter: Payer: Self-pay | Admitting: Unknown Physician Specialty

## 2015-10-29 ENCOUNTER — Encounter: Payer: Self-pay | Admitting: Sports Medicine

## 2015-10-29 NOTE — Telephone Encounter (Signed)
Pt left message in Pt Advice Request about having a return to work note after stomach virus.  I was unable to contact pt through My Chart, I left a message instructing pt to get work note from his PCP.

## 2015-11-08 ENCOUNTER — Ambulatory Visit: Payer: 59 | Admitting: Sports Medicine

## 2015-11-29 ENCOUNTER — Ambulatory Visit (INDEPENDENT_AMBULATORY_CARE_PROVIDER_SITE_OTHER): Payer: 59 | Admitting: Unknown Physician Specialty

## 2015-11-29 ENCOUNTER — Encounter: Payer: Self-pay | Admitting: Unknown Physician Specialty

## 2015-11-29 VITALS — BP 124/87 | HR 83 | Temp 98.4°F | Ht 65.2 in | Wt 174.6 lb

## 2015-11-29 DIAGNOSIS — G4700 Insomnia, unspecified: Secondary | ICD-10-CM | POA: Diagnosis not present

## 2015-11-29 DIAGNOSIS — G4730 Sleep apnea, unspecified: Secondary | ICD-10-CM

## 2015-11-29 DIAGNOSIS — G43009 Migraine without aura, not intractable, without status migrainosus: Secondary | ICD-10-CM

## 2015-11-29 DIAGNOSIS — G43909 Migraine, unspecified, not intractable, without status migrainosus: Secondary | ICD-10-CM | POA: Insufficient documentation

## 2015-11-29 MED ORDER — AMITRIPTYLINE HCL 10 MG PO TABS: 20.0000 mg | ORAL_TABLET | Freq: Every day | ORAL | Status: DC

## 2015-11-29 MED ORDER — SUMATRIPTAN SUCCINATE 100 MG PO TABS: 100.0000 mg | ORAL_TABLET | ORAL | Status: DC | PRN

## 2015-11-29 NOTE — Progress Notes (Signed)
BP 124/87 mmHg  Pulse 83  Temp(Src) 98.4 F (36.9 C)  Ht 5' 5.2" (1.656 m)  Wt 174 lb 9.6 oz (79.198 kg)  BMI 28.88 kg/m2  SpO2 98%   Subjective:    Patient ID: Henry Duncan, male    DOB: 05-25-1991, 25 y.o.   MRN: [id number]  HPI: Henry Duncan is a 25 y.o. male  Chief Complaint  Patient presents with  . Headache    pt states he has been getting heacahes on right side of his head for about 5 weeks now  . Insomnia    pt states he has been having trouble staying asleep, states he stopped [place] because he felt like it wasn't helping  I reviewed notes through psychiatry  Insomnia Initially was prescribed Ambien.  Prescribed Ambien through psychiatry.  States he stopped it as it wasn't helping.  He tosses and turns all the time and unable to stay asleep.  He states he snores at night and many members of his family have sleep apnea. Sleep hygiene seems OK but does have a TV in his room.    Anxiety is better and able to wean off Clonazepam  Headaches States 4-5 weeks his right side of his head, between ear and temple "locks up." and is painful.  Called out of work last time.  Ibuprofen and Tylenol don't help.   Describes it as a pulsating pain and makes him nauseated.  Lasts 1-3 days.  Mother's migraine medication helps    Relevant past medical, surgical, family and social history reviewed and updated as indicated. Interim medical history since our last visit reviewed. Allergies and medications reviewed and updated.  Review of Systems  Per HPI unless specifically indicated above     Objective:    BP 124/87 mmHg  Pulse 83  Temp(Src) 98.4 F (36.9 C)  Ht 5' 5.2" (1.656 m)  Wt 174 lb 9.6 oz (79.198 kg)  BMI 28.88 kg/m2  SpO2 98%  Wt Readings from Last 3 Encounters:  11/29/15 174 lb 9.6 oz (79.198 kg)  06/10/15 178 lb 6.4 oz (80.922 kg)  05/06/15 172 lb (78.019 kg)    Physical Exam  Results for orders placed or performed in visit on 02/11/15  CBC  with Differential/Platelet  Result Value Ref Range   WBC 7.0 3.4 - 10.8 x10E3/uL   RBC 5.08 4.14 - 5.80 x10E6/uL   Hemoglobin 16.2 12.6 - 17.7 g/dL   Hematocrit 46.7 37.5 - 51.0 %   MCV 92 79 - 97 fL   MCH 31.9 26.6 - 33.0 pg   MCHC 34.7 31.5 - 35.7 g/dL   RDW 13.7 12.3 - 15.4 %   Platelets 230 150 - 379 x10E3/uL   Neutrophils 60 %   Lymphs 28 %   Monocytes 9 %   Eos 3 %   Basos 0 %   Neutrophils Absolute 4.2 1.4 - 7.0 x10E3/uL   Lymphocytes Absolute 2.0 0.7 - 3.1 x10E3/uL   Monocytes Absolute 0.6 0.1 - 0.9 x10E3/uL   EOS (ABSOLUTE) 0.2 0.0 - 0.4 x10E3/uL   Basophils Absolute 0.0 0.0 - 0.2 x10E3/uL   Immature Granulocytes 0 %   Immature Grans (Abs) 0.0 0.0 - 0.1 [id number]  Helicobacter pylori abs-IgG+IgA, bld  Result Value Ref Range   H. pylori, IgA Abs <9.0 0.0 - 8.9 units  HELICOBACTER PYLORI  ANTIBODY, IGM  Result Value Ref Range   H pylori, IgM Abs <9.0 0.0 - 8.9 units  Assessment & Plan:   Problem List Items Addressed This Visit      Unprioritized   Insomnia - Primary    Rx for Amitrptyline for both sleep and migraine prevention.  Send for sleep study      Migraine    Amitriptyline for prevention   Rx for Imitrex to take prn      Relevant Medications   amitriptyline (ELAVIL) 10 MG tablet   SUMAtriptan (IMITREX) 100 MG tablet       Follow up plan: Return in about 4 weeks (around 12/27/2015).

## 2015-11-29 NOTE — Assessment & Plan Note (Signed)
Rx for Amitrptyline for both sleep and migraine prevention.  Send for sleep study

## 2015-11-29 NOTE — Assessment & Plan Note (Signed)
Amitriptyline for prevention   Rx for Imitrex to take prn

## 2015-12-13 ENCOUNTER — Ambulatory Visit: Payer: Self-pay | Admitting: Unknown Physician Specialty

## 2015-12-25 ENCOUNTER — Encounter: Payer: Self-pay | Admitting: Unknown Physician Specialty

## 2015-12-27 ENCOUNTER — Ambulatory Visit: Payer: 59 | Admitting: Unknown Physician Specialty

## 2016-03-16 NOTE — Progress Notes (Signed)
Message left on doctors line that pt has stopped medication and to remove any remaining refills from list.

## 2016-10-28 ENCOUNTER — Encounter: Payer: Self-pay | Admitting: Unknown Physician Specialty

## 2016-10-28 ENCOUNTER — Ambulatory Visit (INDEPENDENT_AMBULATORY_CARE_PROVIDER_SITE_OTHER): Payer: 59 | Admitting: Unknown Physician Specialty

## 2016-10-28 VITALS — BP 131/89 | HR 84 | Temp 98.6°F | Wt 192.6 lb

## 2016-10-28 DIAGNOSIS — R160 Hepatomegaly, not elsewhere classified: Secondary | ICD-10-CM | POA: Diagnosis not present

## 2016-10-28 DIAGNOSIS — R1011 Right upper quadrant pain: Secondary | ICD-10-CM | POA: Diagnosis not present

## 2016-10-28 DIAGNOSIS — R0789 Other chest pain: Secondary | ICD-10-CM

## 2016-10-28 LAB — UA/M W/RFLX CULTURE, ROUTINE
Bilirubin, UA: NEGATIVE
Glucose, UA: NEGATIVE
Ketones, UA: NEGATIVE
Leukocytes, UA: NEGATIVE
Nitrite, UA: NEGATIVE
RBC, UA: NEGATIVE
Specific Gravity, UA: 1.02 (ref 1.005–1.030)
Urobilinogen, Ur: 1 mg/dL (ref 0.2–1.0)
pH, UA: 7 (ref 5.0–7.5)

## 2016-10-28 LAB — MICROSCOPIC EXAMINATION
Bacteria, UA: NONE SEEN
WBC, UA: NONE SEEN /hpf (ref 0–?)

## 2016-10-28 NOTE — Progress Notes (Signed)
BP 131/89   Pulse 84   Temp 98.6 F (37 C)   Wt 192 lb 9.6 oz (87.4 kg) Comment: pt had one steel toe boots  SpO2 97%   BMI 31.85 kg/m    Subjective:    Patient ID: Henry Duncan, male    DOB: [birth year], 26 y.o.   MRN: [id number]  HPI: Henry Duncan is a 26 y.o. male  Chief Complaint  Patient presents with  . Pain    pt states he has been having pain on his right side for a week now. Patient states he cannot tell if the pain is coming from his back or ribs and states the pain come and goes    Pt states he is having pain in his right lower rib cage for about a week that is both in the front and in the back while pointing to RUQ of abdomen and chest wall.  Pain is a pressure type pain that comes and goes.   No nausea or fever.  Does not remember lifting anything unusual but lifts a lot at work.  Does not remember hitting it.  No SOB besides rib cage pain.  Sitting makes the pain worse at times.  Pt drinks occasional alcohol.  No recent illnesses or feelings of fatigue.  No sore throat  Past Medical History:  Diagnosis Date  . Anxiety    Past Surgical History:  Procedure Laterality Date  . CLEFT LIP REPAIR     Family History  Problem Relation Age of Onset  . Heart attack Mother   . Anxiety disorder Father   . Diabetes Father   . Depression Brother   . Anxiety disorder Brother       Relevant past medical, surgical, family and social history reviewed and updated as indicated. Interim medical history since our last visit reviewed. Allergies and medications reviewed and updated.  Review of Systems  Per HPI unless specifically indicated above     Objective:    BP 131/89   Pulse 84   Temp 98.6 F (37 C)   Wt 192 lb 9.6 oz (87.4 kg) Comment: pt had one steel toe boots  SpO2 97%   BMI 31.85 kg/m   Wt Readings from Last 3 Encounters:  10/28/16 192 lb 9.6 oz (87.4 kg)  11/29/15 174 lb 9.6 oz (79.2 kg)  05/06/15 172 lb (78 kg)    Physical Exam    Constitutional: He is oriented to person, place, and time. He appears well-developed and well-nourished. No distress.  HENT:  Head: Normocephalic and atraumatic.  Eyes: Conjunctivae and lids are normal. Right eye exhibits no discharge. Left eye exhibits no discharge. No scleral icterus.  Neck: Normal range of motion. Neck supple. No JVD present. Carotid bruit is not present.  Cardiovascular: Normal rate, regular rhythm and normal heart sounds.   Pulmonary/Chest: Effort normal and breath sounds normal. No respiratory distress.  Abdominal: Normal appearance. There is no splenomegaly. There is tenderness in the right upper quadrant. There is no rebound, no guarding and no CVA tenderness.  Liver palpated 2 cm below ICS  Musculoskeletal: Normal range of motion.  Neurological: He is alert and oriented to person, place, and time.  Skin: Skin is warm, dry and intact. No rash noted. No pallor.  Psychiatric: He has a normal mood and affect. His behavior is normal. Judgment and thought content normal.     Assessment & Plan:   Problem List Items Addressed This Visit  None    Visit Diagnoses    RUQ pain    -  Primary   Relevant Orders   CBC with Differential/Platelet   Comprehensive metabolic panel   Amylase   Lipase   UA/M w/rflx Culture, Routine   Chest wall pain       Relevant Orders   DG Chest 2 View   Hepatomegaly       Relevant Orders   Mononucleosis screen      Pt with RUQ and possible chest wall pain.  Check lass as above.  X-ray.  Consider [place] after evaluating labs.     Follow up plan: Return f/u with results.

## 2016-10-29 ENCOUNTER — Telehealth: Payer: Self-pay | Admitting: Unknown Physician Specialty

## 2016-10-29 ENCOUNTER — Other Ambulatory Visit: Payer: Self-pay | Admitting: Unknown Physician Specialty

## 2016-10-29 DIAGNOSIS — R740 Nonspecific elevation of levels of transaminase and lactic acid dehydrogenase [LDH]: Principal | ICD-10-CM

## 2016-10-29 DIAGNOSIS — R7401 Elevation of levels of liver transaminase levels: Secondary | ICD-10-CM

## 2016-10-29 LAB — COMPREHENSIVE METABOLIC PANEL
ALT: 73 IU/L — ABNORMAL HIGH (ref 0–44)
AST: 35 IU/L (ref 0–40)
Albumin/Globulin Ratio: 1.7 (ref 1.2–2.2)
Albumin: 4.5 g/dL (ref 3.5–5.5)
Alkaline Phosphatase: 103 IU/L (ref 39–117)
BUN/Creatinine Ratio: 22 — ABNORMAL HIGH (ref 9–20)
BUN: 15 mg/dL (ref 6–20)
Bilirubin Total: 0.3 mg/dL (ref 0.0–1.2)
CO2: 21 mmol/L (ref 18–29)
Calcium: 9.5 mg/dL (ref 8.7–10.2)
Chloride: 100 mmol/L (ref 96–106)
Creatinine, Ser: 0.69 mg/dL — ABNORMAL LOW (ref 0.76–1.27)
GFR calc Af Amer: 153 mL/min/{1.73_m2} (ref >59)
GFR calc non Af Amer: 132 mL/min/{1.73_m2} (ref >59)
Globulin, Total: 2.6 g/dL (ref 1.5–4.5)
Glucose: 124 mg/dL — ABNORMAL HIGH (ref 65–99)
Potassium: 4 mmol/L (ref 3.5–5.2)
Sodium: 142 mmol/L (ref 134–144)
Total Protein: 7.1 g/dL (ref 6.0–8.5)

## 2016-10-29 LAB — CBC WITH DIFFERENTIAL/PLATELET
Basophils Absolute: 0 10*3/uL (ref 0.0–0.2)
Basos: 0 %
EOS (ABSOLUTE): 0.2 10*3/uL (ref 0.0–0.4)
Eos: 2 %
Hematocrit: 44.6 % (ref 37.5–51.0)
Hemoglobin: 15.1 g/dL (ref 13.0–17.7)
Immature Grans (Abs): 0.1 10*3/uL (ref 0.0–0.1)
Immature Granulocytes: 1 %
Lymphocytes Absolute: 2.4 10*3/uL (ref 0.7–3.1)
Lymphs: 23 %
MCH: 32 pg (ref 26.6–33.0)
MCHC: 33.9 g/dL (ref 31.5–35.7)
MCV: 95 fL (ref 79–97)
Monocytes Absolute: 0.6 10*3/uL (ref 0.1–0.9)
Monocytes: 6 %
Neutrophils Absolute: 7.1 10*3/uL — ABNORMAL HIGH (ref 1.4–7.0)
Neutrophils: 68 %
Platelets: 226 10*3/uL (ref 150–379)
RBC: 4.72 x10E6/uL (ref 4.14–5.80)
RDW: 14.1 % (ref 12.3–15.4)
WBC: 10.4 10*3/uL (ref 3.4–10.8)

## 2016-10-29 LAB — AMYLASE: Amylase: 89 U/L (ref 31–124)

## 2016-10-29 LAB — MONONUCLEOSIS SCREEN: Mono Screen: NEGATIVE

## 2016-10-29 LAB — LIPASE: Lipase: 41 U/L (ref 13–78)

## 2016-10-29 NOTE — Telephone Encounter (Signed)
Looked in patient's chart and see where Malachy Mood sent patient a mychart message about patient's lab work. I called and read the message to the patient and I let him know that the hepatitis panel has been added on. I told the patient that we would let him know that result as soon as we get it back. Patient verbalized understanding.

## 2016-10-29 NOTE — Progress Notes (Signed)
Normal labs.  Pt notified through mychart

## 2016-10-29 NOTE — Progress Notes (Signed)
Elevated AST.  Add hepatitis panel

## 2016-10-29 NOTE — Progress Notes (Signed)
Hepatitis panel added on with lab.

## 2016-10-29 NOTE — Telephone Encounter (Signed)
Patient calling to see if results are in from last visit (10/28/2016).  Please Advise.  Thank you

## 2016-10-30 ENCOUNTER — Other Ambulatory Visit: Payer: Self-pay | Admitting: Unknown Physician Specialty

## 2016-10-30 DIAGNOSIS — R7401 Elevation of levels of liver transaminase levels: Secondary | ICD-10-CM

## 2016-10-30 DIAGNOSIS — K760 Fatty (change of) liver, not elsewhere classified: Secondary | ICD-10-CM | POA: Insufficient documentation

## 2016-10-30 DIAGNOSIS — R1011 Right upper quadrant pain: Secondary | ICD-10-CM

## 2016-10-30 DIAGNOSIS — R740 Nonspecific elevation of levels of transaminase and lactic acid dehydrogenase [LDH]: Secondary | ICD-10-CM

## 2016-10-30 DIAGNOSIS — R109 Unspecified abdominal pain: Secondary | ICD-10-CM | POA: Insufficient documentation

## 2016-10-30 NOTE — Progress Notes (Signed)
Appointment scheduled for Friday 11/06/2016 at 8:00 (arrive at 7:45) at the outpatient imaging center. No food or drink after midnight.  LVM for patient to return my call.

## 2016-11-02 LAB — HEPATITIS PANEL, ACUTE
Hep A IgM: NEGATIVE
Hep B C IgM: NEGATIVE
Hep C Virus Ab: <0.1 s/co ratio (ref 0.0–0.9)
Hepatitis B Surface Ag: NEGATIVE

## 2016-11-02 LAB — SPECIMEN STATUS REPORT

## 2016-11-02 NOTE — Progress Notes (Signed)
Tried calling patient to remind him of his U/S appointment.  No answer. LVM for patient to return my call.

## 2016-11-03 NOTE — Progress Notes (Signed)
Called and left patient a VM asking for him to please return my call.  

## 2016-11-03 NOTE — Progress Notes (Signed)
I believe he also does mychart

## 2016-11-03 NOTE — Progress Notes (Signed)
FYI. I have tried contacting this patient multiple times and left voicemails to notify of him U/S appointment. No call back and unable to contact again 11/03/2016.

## 2016-11-03 NOTE — Progress Notes (Signed)
Tried calling patient again. No answer and I did not leave another VM. Will try to call again this afternoon.

## 2016-11-06 ENCOUNTER — Ambulatory Visit
Admission: RE | Admit: 2016-11-06 | Discharge: 2016-11-06 | Disposition: A | Discharge status: Home / Self Care | Payer: 59 | Source: Ambulatory Visit | Attending: Unknown Physician Specialty | Admitting: Unknown Physician Specialty

## 2016-11-06 DIAGNOSIS — R7401 Elevation of levels of liver transaminase levels: Secondary | ICD-10-CM

## 2016-11-06 DIAGNOSIS — R740 Nonspecific elevation of levels of transaminase and lactic acid dehydrogenase [LDH]: Secondary | ICD-10-CM | POA: Diagnosis not present

## 2016-11-06 DIAGNOSIS — R932 Abnormal findings on diagnostic imaging of liver and biliary tract: Secondary | ICD-10-CM | POA: Diagnosis not present

## 2016-11-06 DIAGNOSIS — R1011 Right upper quadrant pain: Secondary | ICD-10-CM | POA: Insufficient documentation

## 2017-11-03 NOTE — Progress Notes (Signed)
Subjective:    Patient ID: Henry Duncan, male    DOB: 1991/04/05, 27 y.o.   MRN: [id number]  TRAY KLAYMAN is a 27 y.o. male presenting on 11/04/2017 for Migraine   HPI  Henry Duncan is a 27 y/o man presenting today with history of headaches. Has had some certain headaches in the past, but not this severe. Describes one sided headache, worsened by light and noise. Lasts several hours. Nauseated but not vomiting. No aura. No weakness or vision changes. Previously on amitriptyline for headaches, not sure why he discontinued the medications. No prior head injuries or surgeries. Family history of migraines - positive in dad and brother. Previously prescribed Elavil, but hasn't taken in years. He didn't think he was taking it every day at the time. He is not sure why he stopped it. Imitrex not working very well as abortive therapy.   Social History   Tobacco Use  . Smoking status: Never Smoker  . Smokeless tobacco: Never Used  Substance Use Topics  . Alcohol use: No    Alcohol/week: 0.0 oz    Comment: pt states on occasion  . Drug use: No    Review of Systems Per HPI unless specifically indicated above     Objective:    BP (!) 137/93 (BP Location: Right Arm, Patient Position: Sitting, Cuff Size: Normal)   Pulse (!) 111   Temp 98.8 F (37.1 C) (Oral)   Ht 5' 6.5" (1.689 m)   Wt 190 lb (86.2 kg)   SpO2 97%   BMI 30.21 kg/m   Wt Readings from Last 3 Encounters:  11/04/17 190 lb (86.2 kg)  10/28/16 192 lb 9.6 oz (87.4 kg)  11/29/15 174 lb 9.6 oz (79.2 kg)    Physical Exam  Constitutional: He is oriented to person, place, and time. He appears well-developed.  Eyes: Pupils are equal, round, and reactive to light.  Cardiovascular: Normal rate and regular rhythm.  Pulmonary/Chest: Effort normal and breath sounds normal.  Neurological: He is alert and oriented to person, place, and time.  Skin: Skin is warm and dry.  Psychiatric: He has a normal mood  and affect. His behavior is normal.   Results for orders placed or performed in visit on 10/28/16  Microscopic Examination  Result Value Ref Range   WBC, UA None seen 0 - 5 /hpf   RBC, UA 0-2 0 - 2 /hpf   Epithelial Cells (non renal) CANCELED    Mucus, UA Present (A) Not Estab.   Bacteria, UA None seen None seen/Few  CBC with Differential/Platelet  Result Value Ref Range   WBC 10.4 3.4 - 10.8 x10E3/uL   RBC 4.72 4.14 - 5.80 x10E6/uL   Hemoglobin 15.1 13.0 - 17.7 g/dL   Hematocrit 44.6 37.5 - 51.0 %   MCV 95 79 - 97 fL   MCH 32.0 26.6 - 33.0 pg   MCHC 33.9 31.5 - 35.7 g/dL   RDW 14.1 12.3 - 15.4 %   Platelets 226 150 - 379 x10E3/uL   Neutrophils 68 Not Estab. %   Lymphs 23 Not Estab. %   Monocytes 6 Not Estab. %   Eos 2 Not Estab. %   Basos 0 Not Estab. %   Neutrophils Absolute 7.1 (H) 1.4 - 7.0 x10E3/uL   Lymphocytes Absolute 2.4 0.7 - 3.1 x10E3/uL   Monocytes Absolute 0.6 0.1 - 0.9 x10E3/uL   EOS (ABSOLUTE) 0.2 0.0 - 0.4 x10E3/uL   Basophils Absolute 0.0 0.0 - 0.2  x10E3/uL   Immature Granulocytes 1 Not Estab. %   Immature Grans (Abs) 0.1 0.0 - 0.1 x10E3/uL  Comprehensive metabolic panel  Result Value Ref Range   Glucose 124 (H) 65 - 99 mg/dL   BUN 15 6 - 20 mg/dL   Creatinine, Ser 0.69 (L) 0.76 - 1.27 mg/dL   GFR calc non Af Amer 132 >59 mL/min/1.73   GFR calc Af Amer 153 >59 mL/min/1.73   BUN/Creatinine Ratio 22 (H) 9 - 20   Sodium 142 134 - 144 mmol/L   Potassium 4.0 3.5 - 5.2 mmol/L   Chloride 100 96 - 106 mmol/L   CO2 21 18 - 29 mmol/L   Calcium 9.5 8.7 - 10.2 mg/dL   Total Protein 7.1 6.0 - 8.5 g/dL   Albumin 4.5 3.5 - 5.5 g/dL   Globulin, Total 2.6 1.5 - 4.5 g/dL   Albumin/Globulin Ratio 1.7 1.2 - 2.2   Bilirubin Total 0.3 0.0 - 1.2 mg/dL   Alkaline Phosphatase 103 39 - 117 IU/L   AST 35 0 - 40 IU/L   ALT 73 (H) 0 - 44 IU/L  Amylase  Result Value Ref Range   Amylase 89 31 - 124 U/L  Lipase  Result Value Ref Range   Lipase 41 13 - 78 U/L  UA/M w/rflx  Culture, Routine  Result Value Ref Range   Specific Gravity, UA 1.020 1.005 - 1.030   pH, UA 7.0 5.0 - 7.5   Color, UA Yellow Yellow   Appearance Ur Cloudy (A) Clear   Leukocytes, UA Negative Negative   Protein, UA Trace (A) Negative/Trace   Glucose, UA Negative Negative   Ketones, UA Negative Negative   RBC, UA Negative Negative   Bilirubin, UA Negative Negative   Urobilinogen, Ur 1.0 0.2 - 1.0 mg/dL   Nitrite, UA Negative Negative   Microscopic Examination See below:   Mononucleosis screen  Result Value Ref Range   Mono Screen Negative Negative  Hepatitis panel, acute  Result Value Ref Range   Hep A IgM Negative Negative   Hepatitis B Surface Ag Negative Negative   Hep B C IgM Negative Negative   Hep C Virus Ab <0.1 0.0 - 0.9 s/co ratio  Specimen status report  Result Value Ref Range   specimen status report Comment       Assessment & Plan:   1. Other migraine without status migrainosus, not intractable  Longstanding history of migraines with recent worsening and increase in frequency to three times per week. Needs prophylaxis medication, will restart Elavil as below. Can also take b2 200 mg daily and magnesium oxide 400 mg daily. May continue to use imitrex as abortive therapy. Please limit any caffeine, decongestant use to avoid rebound headaches.  - amitriptyline (ELAVIL) 10 MG tablet; Take one tablet before bed x 2 weeks. May increase to two tablets before bed x 2 weeks.  Dispense: 90 tablet; Refill: 0    Follow up plan: Return in about 1 month (around 12/04/2017) for migraines.  Carles Collet, PA-C [facility] 11/04/2017, 11:05 AM

## 2017-11-04 ENCOUNTER — Ambulatory Visit (INDEPENDENT_AMBULATORY_CARE_PROVIDER_SITE_OTHER): Payer: Managed Care, Other (non HMO) | Admitting: Physician Assistant

## 2017-11-04 ENCOUNTER — Encounter: Payer: Self-pay | Admitting: Physician Assistant

## 2017-11-04 VITALS — BP 137/93 | HR 111 | Temp 98.8°F | Ht 66.5 in | Wt 190.0 lb

## 2017-11-04 DIAGNOSIS — G43809 Other migraine, not intractable, without status migrainosus: Secondary | ICD-10-CM

## 2017-11-04 MED ORDER — AMITRIPTYLINE HCL 10 MG PO TABS
ORAL_TABLET | ORAL | 0 refills | Status: DC
Start: 2017-11-04 — End: 2018-09-08

## 2017-11-04 NOTE — Patient Instructions (Addendum)
B2 200 mg daily Magnesium oxide 400 mg daily (these can cause diarrhea)    Migraine Headache A migraine headache is a very strong throbbing pain on one side or both sides of your head. Migraines can also cause other symptoms. Talk with your doctor about what things may bring on (trigger) your migraine headaches. Follow these instructions at home: Medicines  Take over-the-counter and prescription medicines only as told by your doctor.  Do not drive or use heavy machinery while taking prescription pain medicine.  To prevent or treat constipation while you are taking prescription pain medicine, your doctor may recommend that you: ? Drink enough fluid to keep your pee (urine) clear or pale yellow. ? Take over-the-counter or prescription medicines. ? Eat foods that are high in fiber. These include fresh fruits and vegetables, whole grains, and beans. ? Limit foods that are high in fat and processed sugars. These include fried and sweet foods. Lifestyle  Avoid alcohol.  Do not use any products that contain nicotine or tobacco, such as cigarettes and e-cigarettes. If you need help quitting, ask your doctor.  Get at least 8 hours of sleep every night.  Limit your stress. General instructions   Keep a journal to find out what may bring on your migraines. For example, write down: ? What you eat and drink. ? How much sleep you get. ? Any change in what you eat or drink. ? Any change in your medicines.  If you have a migraine: ? Avoid things that make your symptoms worse, such as bright lights. ? It may help to lie down in a dark, quiet room. ? Do not drive or use heavy machinery. ? Ask your doctor what activities are safe for you.  Keep all follow-up visits as told by your doctor. This is important. Contact a doctor if:  You get a migraine that is different or worse than your usual migraines. Get help right away if:  Your migraine gets very bad.  You have a fever.  You have  a stiff neck.  You have trouble seeing.  Your muscles feel weak or like you cannot control them.  You start to lose your balance a lot.  You start to have trouble walking.  You pass out (faint). This information is not intended to replace advice given to you by your health care provider. Make sure you discuss any questions you have with your health care provider. Document Released: 02/18/2008 Document Revised: 11/29/2015 Document Reviewed: 10/28/2015 Elsevier Interactive Patient Education  2018 [employer].

## 2017-12-03 ENCOUNTER — Ambulatory Visit: Payer: Managed Care, Other (non HMO) | Admitting: Unknown Physician Specialty

## 2018-09-05 ENCOUNTER — Encounter: Payer: Self-pay | Admitting: Unknown Physician Specialty

## 2018-09-06 ENCOUNTER — Encounter: Payer: Self-pay | Admitting: Family Medicine

## 2018-09-06 ENCOUNTER — Ambulatory Visit (INDEPENDENT_AMBULATORY_CARE_PROVIDER_SITE_OTHER): Payer: BLUE CROSS/BLUE SHIELD | Admitting: Family Medicine

## 2018-09-06 ENCOUNTER — Other Ambulatory Visit: Payer: Self-pay

## 2018-09-06 VITALS — Ht 66.4 in | Wt 191.0 lb

## 2018-09-06 DIAGNOSIS — F419 Anxiety disorder, unspecified: Secondary | ICD-10-CM

## 2018-09-06 DIAGNOSIS — J3089 Other allergic rhinitis: Secondary | ICD-10-CM | POA: Diagnosis not present

## 2018-09-06 DIAGNOSIS — R0683 Snoring: Secondary | ICD-10-CM | POA: Diagnosis not present

## 2018-09-06 DIAGNOSIS — J309 Allergic rhinitis, unspecified: Secondary | ICD-10-CM | POA: Insufficient documentation

## 2018-09-06 NOTE — Progress Notes (Signed)
Ht 5' 6.4" (1.687 m)   Wt 191 lb (86.6 kg)   BMI 30.46 kg/m    Subjective:    Patient ID: Henry Duncan, male    DOB: 14-Oct-1990, 28 y.o.   MRN: [id number]  HPI: Henry Duncan is a 28 y.o. male  Chief Complaint  Patient presents with  . Sleep Apnea    Problems x 10 years. worsening. Never had sleep study.     . This visit was completed via WebEx due to the restrictions of the COVID-19 pandemic. All issues as above were discussed and addressed. Physical exam was done as above through visual confirmation on WebEx. If it was felt that the patient should be evaluated in the office, they were directed there. The patient verbally consented to this visit. . Location of the patient: home . Location of the provider: home . Those involved with this call:  . Provider: Merrie Roof, PA-C . CMA: Yvonna Alanis, [place] . Front Desk/Registration: Jill Side  . Time spent on call: 25 minutes with patient face to face via video conference. More than 50% of this time was spent in counseling and coordination of care. 5 minutes total spent in review of patient's record and preparation of their chart.  Pt following up today for several years of difficulty sleeping. States he typically can fall asleep fairly well but wakes up 5-6 times nightly and has trouble falling back asleep. Significant other recently recorded him sleeping and noted several times where he seemed to be gasping for air in the night, and he does snore loudly. States his dad and brother both have OSA, concerned he may as well. Was tried on elavil last year which he states didn't improve his anxiety or sleep habits and seemed to make him groggy into the morning. Unisom helped for a time but seems to grow a tolerance to it. Melatonin and other OTC meds don't help him. Of note, does have a hx of anxiety and allergies, for which he takes zyrtec daily but still has congestion during allergy seasons.   Relevant past medical,  surgical, family and social history reviewed and updated as indicated. Interim medical history since our last visit reviewed. Allergies and medications reviewed and updated.  Review of Systems  Per HPI unless specifically indicated above     Objective:    Ht 5' 6.4" (1.687 m)   Wt 191 lb (86.6 kg)   BMI 30.46 kg/m   Wt Readings from Last 3 Encounters:  09/06/18 191 lb (86.6 kg)  11/04/17 190 lb (86.2 kg)  10/28/16 192 lb 9.6 oz (87.4 kg)    Physical Exam Vitals signs and nursing note reviewed.  Constitutional:      General: He is not in acute distress.    Appearance: Normal appearance.     Comments: overweight  HENT:     Head: Atraumatic.     Right Ear: External ear normal.     Left Ear: External ear normal.     Nose:     Comments: Nasal mucosa erythematous and edematous    Mouth/Throat:     Mouth: Mucous membranes are moist.     Pharynx: Oropharynx is clear. Posterior oropharyngeal erythema present.  Eyes:     Extraocular Movements: Extraocular movements intact.     Conjunctiva/sclera: Conjunctivae normal.  Neck:     Musculoskeletal: Normal range of motion.  Pulmonary:     Effort: Pulmonary effort is normal. No respiratory distress.  Musculoskeletal: Normal range of motion.  Skin:    General: Skin is dry.     Findings: No erythema or rash.  Neurological:     Mental Status: He is oriented to person, place, and time.  Psychiatric:        Mood and Affect: Mood normal.        Thought Content: Thought content normal.        Judgment: Judgment normal.     Results for orders placed or performed in visit on 10/28/16  Microscopic Examination  Result Value Ref Range   WBC, UA None seen 0 - 5 /hpf   RBC, UA 0-2 0 - 2 /hpf   Epithelial Cells (non renal) CANCELED    Mucus, UA Present (A) Not Estab.   Bacteria, UA None seen None seen/Few  CBC with Differential/Platelet  Result Value Ref Range   WBC 10.4 3.4 - 10.8 x10E3/uL   RBC 4.72 4.14 - 5.80 x10E6/uL    Hemoglobin 15.1 13.0 - 17.7 g/dL   Hematocrit 44.6 37.5 - 51.0 %   MCV 95 79 - 97 fL   MCH 32.0 26.6 - 33.0 pg   MCHC 33.9 31.5 - 35.7 g/dL   RDW 14.1 12.3 - 15.4 %   Platelets 226 150 - 379 x10E3/uL   Neutrophils 68 Not Estab. %   Lymphs 23 Not Estab. %   Monocytes 6 Not Estab. %   Eos 2 Not Estab. %   Basos 0 Not Estab. %   Neutrophils Absolute 7.1 (H) 1.4 - 7.0 x10E3/uL   Lymphocytes Absolute 2.4 0.7 - 3.1 x10E3/uL   Monocytes Absolute 0.6 0.1 - 0.9 x10E3/uL   EOS (ABSOLUTE) 0.2 0.0 - 0.4 x10E3/uL   Basophils Absolute 0.0 0.0 - 0.2 x10E3/uL   Immature Granulocytes 1 Not Estab. %   Immature Grans (Abs) 0.1 0.0 - 0.1 x10E3/uL  Comprehensive metabolic panel  Result Value Ref Range   Glucose 124 (H) 65 - 99 mg/dL   BUN 15 6 - 20 mg/dL   Creatinine, Ser 0.69 (L) 0.76 - 1.27 mg/dL   GFR calc non Af Amer 132 >59 mL/min/1.73   GFR calc Af Amer 153 >59 mL/min/1.73   BUN/Creatinine Ratio 22 (H) 9 - 20   Sodium 142 134 - 144 mmol/L   Potassium 4.0 3.5 - 5.2 mmol/L   Chloride 100 96 - 106 mmol/L   CO2 21 18 - 29 mmol/L   Calcium 9.5 8.7 - 10.2 mg/dL   Total Protein 7.1 6.0 - 8.5 g/dL   Albumin 4.5 3.5 - 5.5 g/dL   Globulin, Total 2.6 1.5 - 4.5 g/dL   Albumin/Globulin Ratio 1.7 1.2 - 2.2   Bilirubin Total 0.3 0.0 - 1.2 mg/dL   Alkaline Phosphatase 103 39 - 117 IU/L   AST 35 0 - 40 IU/L   ALT 73 (H) 0 - 44 IU/L  Amylase  Result Value Ref Range   Amylase 89 31 - 124 U/L  Lipase  Result Value Ref Range   Lipase 41 13 - 78 U/L  UA/M w/rflx Culture, Routine  Result Value Ref Range   Specific Gravity, UA 1.020 1.005 - 1.030   pH, UA 7.0 5.0 - 7.5   Color, UA Yellow Yellow   Appearance Ur Cloudy (A) Clear   Leukocytes, UA Negative Negative   Protein, UA Trace (A) Negative/Trace   Glucose, UA Negative Negative   Ketones, UA Negative Negative   RBC, UA Negative Negative   Bilirubin, UA Negative Negative   Urobilinogen, Ur 1.0  0.2 - 1.0 mg/dL   Nitrite, UA Negative Negative    Microscopic Examination See below:   Mononucleosis screen  Result Value Ref Range   Mono Screen Negative Negative  Hepatitis panel, acute  Result Value Ref Range   Hep A IgM Negative Negative   Hepatitis B Surface Ag Negative Negative   Hep B C IgM Negative Negative   Hep C Virus Ab <0.1 0.0 - 0.9 s/co ratio  Specimen status report  Result Value Ref Range   specimen status report Comment       Assessment & Plan:   Problem List Items Addressed This Visit      Respiratory   Allergic rhinitis    Add flonase BID to zyrtec regimen in case this will help with his nighttime breathing quality in any way        Other   Acute anxiety    No improvement on elavil, and feels things are stable without medicines right now. Continue supportive care for control with breathing techniques and thought redirection       Other Visit Diagnoses    Snoring    -  Primary   Referral placed to sleep med for sleep study for likely OSA dx. Discussed weight loss, allergy control, sleep hygiene, etc. Declines new meds at this time   Relevant Orders   Ambulatory referral to Sleep Studies       Follow up plan: Return for CPE.

## 2018-09-08 NOTE — Assessment & Plan Note (Signed)
Add flonase BID to zyrtec regimen in case this will help with his nighttime breathing quality in any way

## 2018-09-08 NOTE — Assessment & Plan Note (Signed)
No improvement on elavil, and feels things are stable without medicines right now. Continue supportive care for control with breathing techniques and thought redirection

## 2018-09-13 ENCOUNTER — Emergency Department (HOSPITAL_COMMUNITY): Payer: BLUE CROSS/BLUE SHIELD

## 2018-09-13 ENCOUNTER — Emergency Department (HOSPITAL_COMMUNITY)
Admission: EM | Admit: 2018-09-13 | Discharge: 2018-09-13 | Disposition: A | Discharge status: Home / Self Care | Payer: BLUE CROSS/BLUE SHIELD | Attending: Emergency Medicine | Admitting: Emergency Medicine

## 2018-09-13 ENCOUNTER — Other Ambulatory Visit: Payer: Self-pay

## 2018-09-13 ENCOUNTER — Encounter (HOSPITAL_COMMUNITY): Payer: Self-pay

## 2018-09-13 DIAGNOSIS — F419 Anxiety disorder, unspecified: Secondary | ICD-10-CM | POA: Diagnosis not present

## 2018-09-13 DIAGNOSIS — R000 Tachycardia, unspecified: Secondary | ICD-10-CM | POA: Diagnosis not present

## 2018-09-13 DIAGNOSIS — I712 Thoracic aortic aneurysm, without rupture, unspecified: Secondary | ICD-10-CM

## 2018-09-13 DIAGNOSIS — Z79899 Other long term (current) drug therapy: Secondary | ICD-10-CM | POA: Insufficient documentation

## 2018-09-13 DIAGNOSIS — R0789 Other chest pain: Secondary | ICD-10-CM | POA: Diagnosis present

## 2018-09-13 DIAGNOSIS — R0602 Shortness of breath: Secondary | ICD-10-CM | POA: Diagnosis not present

## 2018-09-13 DIAGNOSIS — R7989 Other specified abnormal findings of blood chemistry: Secondary | ICD-10-CM

## 2018-09-13 DIAGNOSIS — I10 Essential (primary) hypertension: Secondary | ICD-10-CM | POA: Diagnosis not present

## 2018-09-13 DIAGNOSIS — I160 Hypertensive urgency: Secondary | ICD-10-CM

## 2018-09-13 DIAGNOSIS — R079 Chest pain, unspecified: Secondary | ICD-10-CM | POA: Diagnosis not present

## 2018-09-13 LAB — CBC WITH DIFFERENTIAL/PLATELET
Abs Immature Granulocytes: 0.14 10*3/uL — ABNORMAL HIGH (ref 0.00–0.07)
Basophils Absolute: 0.1 10*3/uL (ref 0.0–0.1)
Basophils Relative: 1 %
Eosinophils Absolute: 0.2 10*3/uL (ref 0.0–0.5)
Eosinophils Relative: 2 %
HCT: 50.4 % (ref 39.0–52.0)
Hemoglobin: 17 g/dL (ref 13.0–17.0)
Immature Granulocytes: 1 %
Lymphocytes Relative: 36 %
Lymphs Abs: 4.3 10*3/uL — ABNORMAL HIGH (ref 0.7–4.0)
MCH: 33.7 pg (ref 26.0–34.0)
MCHC: 33.7 g/dL (ref 30.0–36.0)
MCV: 99.8 fL (ref 80.0–100.0)
Monocytes Absolute: 1.1 10*3/uL — ABNORMAL HIGH (ref 0.1–1.0)
Monocytes Relative: 9 %
Neutro Abs: 6.1 10*3/uL (ref 1.7–7.7)
Neutrophils Relative %: 51 %
Platelets: 293 10*3/uL (ref 150–400)
RBC: 5.05 MIL/uL (ref 4.22–5.81)
RDW: 13.2 % (ref 11.5–15.5)
WBC: 11.9 10*3/uL — ABNORMAL HIGH (ref 4.0–10.5)
nRBC: 0 % (ref 0.0–0.2)

## 2018-09-13 LAB — BASIC METABOLIC PANEL
Anion gap: 19 — ABNORMAL HIGH (ref 5–15)
BUN: 10 mg/dL (ref 6–20)
CO2: 16 mmol/L — ABNORMAL LOW (ref 22–32)
Calcium: 8.8 mg/dL — ABNORMAL LOW (ref 8.9–10.3)
Chloride: 102 mmol/L (ref 98–111)
Creatinine, Ser: 0.7 mg/dL (ref 0.61–1.24)
GFR calc Af Amer: >60 mL/min (ref >60)
GFR calc non Af Amer: >60 mL/min (ref >60)
Glucose, Bld: 166 mg/dL — ABNORMAL HIGH (ref 70–99)
Potassium: 3.2 mmol/L — ABNORMAL LOW (ref 3.5–5.1)
Sodium: 137 mmol/L (ref 135–145)

## 2018-09-13 LAB — TROPONIN I
Troponin I: <0.03 ng/mL (ref <0.03)
Troponin I: 0.06 ng/mL (ref <0.03)

## 2018-09-13 LAB — D-DIMER, QUANTITATIVE: D-Dimer, Quant: <0.27 ug/mL-FEU (ref 0.00–0.50)

## 2018-09-13 MED ORDER — ASPIRIN 81 MG PO CHEW
324.0000 mg | CHEWABLE_TABLET | Freq: Once | ORAL | Status: AC
  Administered 2018-09-13: 08:00:00 324 mg via ORAL
  Filled 2018-09-13: qty 4

## 2018-09-13 MED ORDER — LABETALOL HCL 5 MG/ML IV SOLN
10.0000 mg | Freq: Once | INTRAVENOUS | Status: AC
  Administered 2018-09-13: 12:00:00 10 mg via INTRAVENOUS
  Filled 2018-09-13: qty 4

## 2018-09-13 MED ORDER — SODIUM CHLORIDE 0.9 % IV BOLUS
1000.0000 mL | Freq: Once | INTRAVENOUS | Status: AC
  Administered 2018-09-13: 1000 mL via INTRAVENOUS

## 2018-09-13 MED ORDER — IOHEXOL 350 MG/ML SOLN
100.0000 mL | Freq: Once | INTRAVENOUS | Status: AC | PRN
  Administered 2018-09-13: 100 mL via INTRAVENOUS

## 2018-09-13 MED ORDER — KETOROLAC TROMETHAMINE 30 MG/ML IJ SOLN
30.0000 mg | Freq: Once | INTRAMUSCULAR | Status: AC
  Administered 2018-09-13: 09:00:00 30 mg via INTRAVENOUS
  Filled 2018-09-13: qty 1

## 2018-09-13 MED ORDER — LABETALOL HCL 100 MG PO TABS: 100.0000 mg | ORAL_TABLET | Freq: Two times a day (BID) | ORAL | 1 refills | Status: DC

## 2018-09-13 MED ORDER — LABETALOL HCL 5 MG/ML IV SOLN
10.0000 mg | Freq: Once | INTRAVENOUS | Status: AC
  Administered 2018-09-13: 12:00:00 10 mg via INTRAVENOUS

## 2018-09-13 MED ORDER — LORAZEPAM 2 MG/ML IJ SOLN
0.5000 mg | Freq: Once | INTRAMUSCULAR | Status: AC
  Administered 2018-09-13: 08:00:00 0.5 mg via INTRAVENOUS
  Filled 2018-09-13: qty 1

## 2018-09-13 NOTE — ED Notes (Signed)
Patient transported to CT 

## 2018-09-13 NOTE — ED Notes (Signed)
Pt lsighlty pale. Pt has been on phone. Nad. Pt diaphoretic. Pt states has been intermittent

## 2018-09-13 NOTE — ED Notes (Signed)
Pt has been seen by Dr Bronson Ing. Pt resting.

## 2018-09-13 NOTE — ED Provider Notes (Signed)
[facility] EMERGENCY DEPARTMENT Provider Note   CSN: [id number] Arrival date & time: 09/13/18  0710    History   Chief Complaint Chief Complaint  Patient presents with   Chest Pain    HPI Henry Duncan is a 28 y.o. male.  He has a history of anxiety.  He was awoken with left-sided chest pain about an hour prior to arrival.  There is no radiation.  He says it 7 out of 10.  Associated with diaphoresis.He is not sure if he it is making him feel short of breath.  No lightheadedness.  Is never had this before.  Non-smoker no drugs.  No recent change in activities or trauma.     The history is provided by the patient.  Chest Pain  Pain location:  Substernal area and L chest Pain quality: pressure   Pain radiates to:  Does not radiate Pain severity:  Moderate Onset quality:  Sudden Duration:  1 hour Timing:  Constant Progression:  Unchanged Chronicity:  New Context: at rest   Relieved by:  None tried Worsened by:  Nothing Ineffective treatments:  None tried Associated symptoms: anxiety and diaphoresis   Associated symptoms: no abdominal pain, no back pain, no cough, no fever, no headache, no heartburn, no lower extremity edema, no nausea, no numbness, no shortness of breath, no syncope and no vomiting   Risk factors: male sex     Past Medical History:  Diagnosis Date   Anxiety     Patient Active Problem List   Diagnosis Date Noted   Allergic rhinitis 09/06/2018   Abdominal pain 10/30/2016   Elevated ALT measurement 10/30/2016   Insomnia 11/29/2015   Migraine 11/29/2015   GERD (gastroesophageal reflux disease) 02/11/2015   Acute anxiety 02/11/2015   TENDINITIS TIBIALIS 10/24/2007    Past Surgical History:  Procedure Laterality Date   CLEFT LIP REPAIR          Home Medications    Prior to Admission medications   Medication Sig Start Date End Date Taking? Authorizing Provider  cetirizine-pseudoephedrine (ZYRTEC-D) 5-120 MG per tablet Take  1 tablet by mouth 2 (two) times daily.    [provider]    Family History Family History  Problem Relation Age of Onset   Heart attack Mother    Anxiety disorder Father    Diabetes Father    Depression Brother    Anxiety disorder Brother     Social History Social History   Tobacco Use   Smoking status: Never Smoker   Smokeless tobacco: Never Used  Substance Use Topics   Alcohol use: No    Alcohol/week: 0.0 standard drinks    Comment: pt states on occasion   Drug use: No     Allergies   Patient has no known allergies.   Review of Systems Review of Systems  Constitutional: Positive for diaphoresis. Negative for fever.  HENT: Negative for sore throat.   Eyes: Negative for visual disturbance.  Respiratory: Negative for cough and shortness of breath.   Cardiovascular: Positive for chest pain. Negative for syncope.  Gastrointestinal: Negative for abdominal pain, heartburn, nausea and vomiting.  Genitourinary: Negative for dysuria.  Musculoskeletal: Negative for back pain.  Skin: Negative for rash.  Neurological: Negative for numbness and headaches.     Physical Exam Updated Vital Signs BP (!) 155/114 (BP Location: Left Arm)    Pulse (!) 128    Temp 97.9 F (36.6 C) (Oral)    Resp (!) 30  SpO2 98%   Physical Exam Vitals signs and nursing note reviewed.  Constitutional:      Appearance: He is well-developed.  HENT:     Head: Normocephalic and atraumatic.  Eyes:     Conjunctiva/sclera: Conjunctivae normal.  Neck:     Musculoskeletal: Neck supple.  Cardiovascular:     Rate and Rhythm: Regular rhythm. Tachycardia present.     Heart sounds: Normal heart sounds. No murmur.  Pulmonary:     Effort: Pulmonary effort is normal. Tachypnea present. No respiratory distress.     Breath sounds: Normal breath sounds.  Abdominal:     Palpations: Abdomen is soft.     Tenderness: There is no abdominal tenderness.  Musculoskeletal:     Right lower  leg: He exhibits no tenderness. No edema.     Left lower leg: He exhibits no tenderness. No edema.  Skin:    General: Skin is warm and dry.     Capillary Refill: Capillary refill takes less than 2 seconds.  Neurological:     General: No focal deficit present.     Mental Status: He is alert and oriented to person, place, and time.      ED Treatments / Results  Labs (all labs ordered are listed, but only abnormal results are displayed) Labs Reviewed  BASIC METABOLIC PANEL - Abnormal; Notable for the following components:      Result Value   Potassium 3.2 (*)    CO2 16 (*)    Glucose, Bld 166 (*)    Calcium 8.8 (*)    Anion gap 19 (*)    All other components within normal limits  CBC WITH DIFFERENTIAL/PLATELET - Abnormal; Notable for the following components:   WBC 11.9 (*)    Lymphs Abs 4.3 (*)    Monocytes Absolute 1.1 (*)    Abs Immature Granulocytes 0.14 (*)    All other components within normal limits  TROPONIN I - Abnormal; Notable for the following components:   Troponin I 0.06 (*)    All other components within normal limits  TROPONIN I  D-DIMER, QUANTITATIVE (NOT AT [facility])    EKG None  Radiology Ct Angio Chest Pe W/cm &/or Wo Cm  Result Date: 09/13/2018 CLINICAL DATA:  Shortness of breath and chest pain EXAM: CT ANGIOGRAPHY CHEST WITH CONTRAST TECHNIQUE: Multidetector CT imaging of the chest was performed using the standard protocol during bolus administration of intravenous contrast. Multiplanar CT image reconstructions and MIPs were obtained to evaluate the vascular anatomy. CONTRAST:  184mL OMNIPAQUE IOHEXOL 350 MG/ML SOLN COMPARISON:  Chest radiograph September 13, 2018 FINDINGS: Cardiovascular: There is no demonstrable pulmonary embolus. There is dilatation of the ascending thoracic aorta. The maximum transverse diameter of the ascending aorta measures 5.2 x 5.1 cm. The aorta at the sinotubular junction measures 4.7 cm in diameter. There is no dilatation of the  descending thoracic aorta. There is no evident dissection of the thoracic aorta. The visualized great vessels appear unremarkable. There are foci of coronary artery calcification. There is a small pericardial effusion. No pericardial thickening evident. No evident contrast extravasation in the mediastinum or pericardial region. Mediastinum/Nodes: Thyroid appears unremarkable. There is no appreciable thoracic adenopathy. There is a fairly small hiatal hernia. Lungs/Pleura: No blastic or lytic bone lesions are identified. There is slight left base atelectasis. No pleural effusion or pleural thickening evident. Upper Abdomen: There is hepatic steatosis. Visualized upper abdominal structures otherwise appear unremarkable. Musculoskeletal: No blastic or lytic bone lesions are evident. There are no  appreciable chest wall lesions. Review of the MIP images confirms the above findings. IMPRESSION: 1. Ascending thoracic aortic aneurysm with a measured transverse diameter of the ascending aorta of 5.2 x 5.1 cm. Dilatation at the sinotubular junction with a measured diameter of 4.7 cm. Arch and descending aortic regions appear unremarkable. No dissection. No demonstrable pulmonary embolus. Ascending thoracic aortic aneurysm. Recommend semi-annual imaging followup by CTA or MRA and referral to cardiothoracic surgery if not already obtained. This recommendation follows 2010 ACCF/AHA/AATS/ACR/ASA/SCA/SCAI/SIR/STS/SVM Guidelines for the Diagnosis and Management of Patients With Thoracic Aortic Disease. Circulation. 2010; 121: J884-Z660. Aortic aneurysm NOS (ICD10-I71.9). 2.  Small pericardial effusion. 3.   Foci of coronary artery calcification noted. 4. No edema or consolidation. Slight left base atelectasis. No pleural effusion. 5.  No adenopathy evident. 6.  Hepatic steatosis. Electronically Signed   By: Lowella Grip III M.D.   On: 09/13/2018 11:21   Dg Chest Port 1 View  Result Date: 09/13/2018 CLINICAL DATA:  Chest  pain EXAM: PORTABLE CHEST 1 VIEW COMPARISON:  September 21, 2008 FINDINGS: The lungs are clear. Heart is upper normal in size with pulmonary vascularity normal. No adenopathy. No pneumothorax. No bone lesions. IMPRESSION: No edema or consolidation. Electronically Signed   By: Lowella Grip III M.D.   On: 09/13/2018 07:49    Procedures .Critical Care Performed by: Hayden Rasmussen, MD Authorized by: Hayden Rasmussen, MD   Critical care provider statement:    Critical care time (minutes):  45   Critical care time was exclusive of:  Separately billable procedures and treating other patients   Critical care was necessary to treat or prevent imminent or life-threatening deterioration of the following conditions:  Circulatory failure   Critical care was time spent personally by me on the following activities:  Discussions with consultants, evaluation of patient's response to treatment, examination of patient, ordering and performing treatments and interventions, ordering and review of laboratory studies, ordering and review of radiographic studies, pulse oximetry, re-evaluation of patient's condition, obtaining history from patient or surrogate, review of old charts and development of treatment plan with patient or surrogate   I assumed direction of critical care for this patient from another provider in my specialty: no     (including critical care time)  Medications Ordered in ED Medications  LORazepam (ATIVAN) injection 0.5 mg (has no administration in time range)  aspirin chewable tablet 324 mg (has no administration in time range)     Initial Impression / Assessment and Plan / ED Course  I have reviewed the triage vital signs and the nursing notes.  Pertinent labs & imaging results that were available during my care of the patient were reviewed by me and considered in my medical decision making (see chart for details).    Clinical Course as of Sep 12 1408  Tue Sep 13, 2018  6301  Differential diagnosis includes ACS, pneumothorax, pneumonia, GERD, musculoskeletal, PE.  EKG is sinus tachycardia with a rate of 119 normal intervals nonspecific ST-T changes.   [MB]  [id number] Patient's initial troponin and d-dimer are negative.  The pain hass only been going on for an hour or so he will at least need a delta Troponin.  He still is tachycardic and hypertensive although it seems to be trending down.  His mediastinum looked a little generous on the chest x-ray although radiology read it is negative.  Bilateral blood pressure is symmetric and d-dimer negative making dissection less likely.   [MB]  6010 Patient states  the pain is a little bit better after the Toradol.  He still remains tachycardic and tachypneic and looks uncomfortable.  Have put him in for CT PE although low likelihood with a negative d-dimer.  Delta troponin also ordered   [MB]  1135 Patient CT PE shows a 5 cm ascending aortic aneurysm with no evidence of dissection or leak.  Placed a call into's cardiothoracic surgery at [facility].  Waiting for callback.  Remains tachycardic and hypertensive and so ordered IV labetalol.   [MB]  1211 Discussed with the PA on Dr. Roger Shelter service.  She reviewed the case with him in the operating room and it was not felt that he needed acute intervention.  She is going to touch base with cardiology regarding further recommendations.   [MB]  6546 Patient states his pain is down about a 5 out of 10.  His heart rate and blood pressure improved.  I received a call back from the cardiology master who will have somebody from cardiology team evaluate the patient in the ED.   [MB]  1323 Discussed with Dr. Bronson Ing from cardiology who will evaluate the patient in the ED.   [MB]  5035 Patient was seen by Dr. Bronson Ing.  He felt the patient was able to be discharged if we can get him close follow-up tomorrow with cardiothoracic surgery.  He is recommending the patient go on labetalol 100 mg twice daily.   He does not feel that the rising troponin is significant.   [MB]  4656 Discussed with Thurmond Butts from Dr. Kathrin Greathouse office who will arrange to get him an appointment tomorrow afternoon at 2:45 PM.  I will review this with the patient   [MB]    Clinical Course User Index [MB] Hayden Rasmussen, MD   [name] was evaluated in Emergency Department on 09/13/2018 for the symptoms described in the history of present illness. He was evaluated in the context of the global COVID-19 pandemic, which necessitated consideration that the patient might be at risk for infection with the SARS-CoV-2 virus that causes COVID-19. Institutional protocols and algorithms that pertain to the evaluation of patients at risk for COVID-19 are in a state of rapid change based on information released by regulatory bodies including the CDC and federal and state organizations. These policies and algorithms were followed during the patient's care in the ED.      Final Clinical Impressions(s) / ED Diagnoses   Final diagnoses:  Nonspecific chest pain  Hypertension, unspecified type  Thoracic aortic aneurysm without rupture [facility])    ED Discharge Orders         Ordered    labetalol (NORMODYNE) 100 MG tablet  2 times daily     09/13/18 1403           Hayden Rasmussen, MD 09/13/18 1510

## 2018-09-13 NOTE — ED Notes (Signed)
CRITICAL VALUE ALERT  Critical Value:  Troponin 0.06  Date & Time Notied:  09/13/2018, 1159  Provider Notified: Dr. Melina Copa  Orders Received/Actions taken: see chart

## 2018-09-13 NOTE — Discharge Instructions (Addendum)
You were seen in the emergency department for chest pain.  You had blood work EKG chest x-ray and a CAT scan of your chest.  You were found to have a thoracic aneurysm and elevated blood pressure.  These will need very close follow-up and you have an appointment with cardiothoracic surgery Dr. Caffie Pinto tomorrow.  You were seen by cardiology here and they recommend that you start on some blood pressure medicine which we have sent to the pharmacy.  Please return if you have any worsening symptoms.

## 2018-09-13 NOTE — Consult Note (Signed)
Cardiology Consultation:   Patient ID: [name] MRN: [id number]; DOB: November 12, 1990  Admit date: 09/13/2018 Date of Consult: 09/13/2018  Primary Care Provider: Volney American, PA-C Primary Cardiologist: No primary care provider on file.  Primary Electrophysiologist:  None    Patient Profile:   Henry Duncan is a 28 y.o. male with a history of anxiety who is being seen today for the evaluation of chest pain, hypertension, and ascending thoracic aortic aneurysm at the request of Dr. Melina Copa.  History of Present Illness:   [name] is a 28 year old male with a history of anxiety who was woken by chest pain earlier this morning.  It was located on the left side of his chest and accompanied by diaphoresis.  BP was 155/114 upon arrival with a heart rate of 128 bpm.  He has no history of tobacco, alcohol, or drug use. Chest x-ray showed no edema or consolidation.  CT angiography of the chest showed an ascending thoracic aortic aneurysm with a transverse diameter of 5.2 x 5.1 cm.  There was dilatation at the sinotubular junction with a measured diameter of 4.7 cm.  There was no evidence of dissection.  There was no evidence of pulmonary embolism.  There was a small pericardial effusion and foci of coronary artery calcification.  There was no adenopathy.  There was hepatic steatosis.  Troponins were less than 0.03 and 0.06.  D-dimer was normal.  White blood cells were mildly elevated at 11.9.  Potassium mildly low at 3.2.  He was given aspirin, Toradol, Ativan, and to 10 mg doses of IV labetalol.  Most recent blood pressure is 146/116 with a heart rate of 94.  Upon speaking with him further, he told me he was awoken at approximately 5:45 AM.  Several years ago he had had intermittent chest pains and underwent an unremarkable evaluation as per the patient.  When he woke this morning he felt lightheaded and dizzy but denies syncope.  He said he is feeling much  better and now rates the chest pain 3/10.  He denies orthopnea and paroxysmal nocturnal dyspnea.  He denies exertional fatigue.  Social history: He works in a [employer], [employer] in [place].  He drinks alcohol on occasion.  He does not smoke cigarettes.  He does not use drugs.  Family history: Both his 26 year old brother and his father both have hypertension.  There is no known history of connective tissue disease or sudden cardiac death.  His father does have some form of heart disease.    Past Medical History:  Diagnosis Date   Anxiety     Past Surgical History:  Procedure Laterality Date   CLEFT LIP REPAIR       Home Medications:  Prior to Admission medications   Medication Sig Start Date End Date Taking? Authorizing Provider  aspirin EC 81 MG tablet Take 81 mg by mouth daily.   Yes [provider]  cetirizine-pseudoephedrine (ZYRTEC-D) 5-120 MG per tablet Take 1 tablet by mouth 2 (two) times daily.   Yes [provider]    Inpatient Medications: Scheduled Meds:  Continuous Infusions:  PRN Meds:   Allergies:   No Known Allergies  Social History:   Social History   Socioeconomic History   Marital status: Single    Spouse name: Not on file   Number of children: Not on file   Years of education: Not on file   Highest education level: Not on file  Occupational History   Not on file  Social Designer, fashion/clothing strain: Not on file   Food insecurity:    Worry: Not on file    Inability: Not on file   Transportation needs:    Medical: Not on file    Non-medical: Not on file  Tobacco Use   Smoking status: Never Smoker   Smokeless tobacco: Never Used  Substance and Sexual Activity   Alcohol use: No    Alcohol/week: 0.0 standard drinks    Comment: pt states on occasion   Drug use: No   Sexual activity: Yes    Birth control/protection: Condom  Lifestyle   Physical activity:    Days per week: Not on file    Minutes per session:  Not on file   Stress: Not on file  Relationships   Social connections:    Talks on phone: Not on file    Gets together: Not on file    Attends religious service: Not on file    Active member of club or organization: Not on file    Attends meetings of clubs or organizations: Not on file    Relationship status: Not on file   Intimate partner violence:    Fear of current or ex partner: Not on file    Emotionally abused: Not on file    Physically abused: Not on file    Forced sexual activity: Not on file  Other Topics Concern   Not on file  Social History Narrative   Not on file    Family History:    Family History  Problem Relation Age of Onset   Heart attack Mother    Anxiety disorder Father    Diabetes Father    Depression Brother    Anxiety disorder Brother      ROS:  Please see the history of present illness.   All other ROS reviewed and negative.     Physical Exam/Data:   Vitals:   09/13/18 1155 09/13/18 1200 09/13/18 1205 09/13/18 1220  BP: (!) 148/113 (!) 140/116 (!) 140/114 (!) 146/116  Pulse: 96 94 98 94  Resp: 18 13 (!) 25   Temp:    97.9 F (36.6 C)  TempSrc:    Oral  SpO2: 95% 96% 96%     Intake/Output Summary (Last 24 hours) at 09/13/2018 1309 Last data filed at 09/13/2018 1022 Gross per 24 hour  Intake 1000 ml  Output --  Net 1000 ml   Last 3 Weights 09/06/2018 11/04/2017 10/28/2016  Weight (lbs) 191 lb 190 lb 192 lb 9.6 oz  Weight (kg) 86.637 kg 86.183 kg 87.363 kg  Some encounter information is confidential and restricted. Go to Review Flowsheets activity to see all data.     There is no height or weight on file to calculate BMI.  General:  Well nourished, well developed, in no acute distress HEENT: normal Lymph: no adenopathy Neck: no JVD Endocrine:  No thryomegaly Cardiac:  normal S1, S2; RRR; no murmur  Lungs:  clear to auscultation bilaterally, no wheezing, rhonchi or rales  Abd: soft, nontender, no hepatomegaly  Ext: no  edema Musculoskeletal:  No deformities, BUE and BLE strength normal and equal Skin: warm and dry  Neuro:  CNs 2-12 intact, no focal abnormalities noted Psych:  Normal affect   EKG:  The EKG was personally reviewed and demonstrates: Sinus tachycardia, 119 bpm, nonspecific ST segment abnormalities Telemetry:  Telemetry was personally reviewed and demonstrates: Sinus rhythm  Relevant CV Studies: CT chest angiogram reviewed below  Laboratory  Data:  Chemistry Recent Labs  Lab 09/13/18 0724  NA 137  K 3.2*  CL 102  CO2 16*  GLUCOSE 166*  BUN 10  CREATININE 0.70  CALCIUM 8.8*  GFRNONAA >60  GFRAA >60  ANIONGAP 19*    No results for input(s): PROT, ALBUMIN, AST, ALT, ALKPHOS, BILITOT in the last 168 hours. Hematology Recent Labs  Lab 09/13/18 0724  WBC 11.9*  RBC 5.05  HGB 17.0  HCT 50.4  MCV 99.8  MCH 33.7  MCHC 33.7  RDW 13.2  PLT 293   Cardiac Enzymes Recent Labs  Lab 09/13/18 0724 09/13/18 1119  TROPONINI <0.03 0.06*   No results for input(s): TROPIPOC in the last 168 hours.  BNPNo results for input(s): BNP, PROBNP in the last 168 hours.  DDimer  Recent Labs  Lab 09/13/18 0724  DDIMER <0.27    Radiology/Studies:  Ct Angio Chest Pe W/cm &/or Wo Cm  Result Date: 09/13/2018 CLINICAL DATA:  Shortness of breath and chest pain EXAM: CT ANGIOGRAPHY CHEST WITH CONTRAST TECHNIQUE: Multidetector CT imaging of the chest was performed using the standard protocol during bolus administration of intravenous contrast. Multiplanar CT image reconstructions and MIPs were obtained to evaluate the vascular anatomy. CONTRAST:  197mL OMNIPAQUE IOHEXOL 350 MG/ML SOLN COMPARISON:  Chest radiograph September 13, 2018 FINDINGS: Cardiovascular: There is no demonstrable pulmonary embolus. There is dilatation of the ascending thoracic aorta. The maximum transverse diameter of the ascending aorta measures 5.2 x 5.1 cm. The aorta at the sinotubular junction measures 4.7 cm in diameter. There  is no dilatation of the descending thoracic aorta. There is no evident dissection of the thoracic aorta. The visualized great vessels appear unremarkable. There are foci of coronary artery calcification. There is a small pericardial effusion. No pericardial thickening evident. No evident contrast extravasation in the mediastinum or pericardial region. Mediastinum/Nodes: Thyroid appears unremarkable. There is no appreciable thoracic adenopathy. There is a fairly small hiatal hernia. Lungs/Pleura: No blastic or lytic bone lesions are identified. There is slight left base atelectasis. No pleural effusion or pleural thickening evident. Upper Abdomen: There is hepatic steatosis. Visualized upper abdominal structures otherwise appear unremarkable. Musculoskeletal: No blastic or lytic bone lesions are evident. There are no appreciable chest wall lesions. Review of the MIP images confirms the above findings. IMPRESSION: 1. Ascending thoracic aortic aneurysm with a measured transverse diameter of the ascending aorta of 5.2 x 5.1 cm. Dilatation at the sinotubular junction with a measured diameter of 4.7 cm. Arch and descending aortic regions appear unremarkable. No dissection. No demonstrable pulmonary embolus. Ascending thoracic aortic aneurysm. Recommend semi-annual imaging followup by CTA or MRA and referral to cardiothoracic surgery if not already obtained. This recommendation follows 2010 ACCF/AHA/AATS/ACR/ASA/SCA/SCAI/SIR/STS/SVM Guidelines for the Diagnosis and Management of Patients With Thoracic Aortic Disease. Circulation. 2010; 121: Z366-Y403. Aortic aneurysm NOS (ICD10-I71.9). 2.  Small pericardial effusion. 3.   Foci of coronary artery calcification noted. 4. No edema or consolidation. Slight left base atelectasis. No pleural effusion. 5.  No adenopathy evident. 6.  Hepatic steatosis. Electronically Signed   By: Lowella Grip III M.D.   On: 09/13/2018 11:21   Dg Chest Port 1 View  Result Date:  09/13/2018 CLINICAL DATA:  Chest pain EXAM: PORTABLE CHEST 1 VIEW COMPARISON:  September 21, 2008 FINDINGS: The lungs are clear. Heart is upper normal in size with pulmonary vascularity normal. No adenopathy. No pneumothorax. No bone lesions. IMPRESSION: No edema or consolidation. Electronically Signed   By: Lowella Grip III M.D.  On: 09/13/2018 07:49    Assessment and Plan:   1.  Ascending thoracic aortic aneurysm: Given evidence of concomitant dilatation of the sinotubular junction, I suspect he has an underlying connective tissue disease. There is no cardiac murmur on exam to suggest aortic stenosis or regurgitation or bicuspid aortic valve. For blood pressure control, I recommend he be given a prescription for labetalol 100 mg twice daily.  I discussed this with Dr. Melina Copa. He will follow-up with CT surgery in the outpatient setting tomorrow.  2.  Hypertensive urgency: For blood pressure control, I recommend he be given a prescription for labetalol 100 mg twice daily.  I discussed this with Dr. Melina Copa.  3.  Troponin elevation: This is likely due to demand ischemia in the setting of hypertensive urgency.  No further troponins need to be checked.  I discussed this with Dr. Melina Copa.   For questions or updates, please contact [place] Please consult [email or web address] for contact info under     Signed, Kate Sable, MD  09/13/2018 1:09 PM

## 2018-09-13 NOTE — ED Triage Notes (Signed)
Pt reports woke up 1 hour ago with left sided chest pain.  Denies cough, fever or sob.  Pt diaphoretic.  Reports pain is worse with movement.

## 2018-09-13 NOTE — ED Notes (Signed)
ED Provider at bedside. 

## 2018-09-13 NOTE — ED Notes (Signed)
Pt returned from ct. Nad. C/o tightness to mid chest rating 7. Nad. Aware awaiting results. Of ct

## 2018-09-14 ENCOUNTER — Institutional Professional Consult (permissible substitution) (INDEPENDENT_AMBULATORY_CARE_PROVIDER_SITE_OTHER): Payer: BLUE CROSS/BLUE SHIELD | Admitting: Surgery

## 2018-09-14 ENCOUNTER — Encounter: Payer: Self-pay | Admitting: Surgery

## 2018-09-14 ENCOUNTER — Other Ambulatory Visit: Payer: Self-pay | Admitting: *Deleted

## 2018-09-14 VITALS — BP 111/85 | HR 108 | Temp 98.4°F | Resp 16 | Ht 67.0 in | Wt 191.0 lb

## 2018-09-14 DIAGNOSIS — I712 Thoracic aortic aneurysm, without rupture, unspecified: Secondary | ICD-10-CM

## 2018-09-14 DIAGNOSIS — R079 Chest pain, unspecified: Secondary | ICD-10-CM

## 2018-09-14 NOTE — Progress Notes (Signed)
Cardiothoracic Surgery Consultation   PCP is Crissman, Jeannette How, MD Referring Provider is Harriet Butte, MD  Chief Complaint  Patient presents with  . Thoracic Aortic Aneurysm    Surgical eval, CTA Chest 09/13/18    HPI:  The patient is a 28 year old gentleman with a history of anxiety who was woken up by chest pain yesterday morning.  He described this as left-sided, fairly severe, and accompanied by diaphoresis.  He had had occasional episodes of chest discomfort in the past.  He presented to the [facility] emergency room where his blood pressure was 155/114 and his heart rate was 128.  Chest x-ray was unremarkable.  He had a CTA of the chest which showed an ascending aortic aneurysm with a diameter of 4.7 cm at the sinotubular junction and 5.2 x 5.1 cm in the mid a sending aorta.  There is no evidence of aortic dissection.  There is a small pericardial effusion.  There is no pulmonary embolism.  His troponin was less than 0.03.  His hypertension was treated in the emergency room with IV labetalol.  He was seen by Dr. Bronson Ing in consultation and he was started on labetalol 100 mg twice daily.  He said that he has taken 2 doses of this so far and has had no further chest discomfort.   Past Medical History:  Diagnosis Date  . Anxiety     Past Surgical History:  Procedure Laterality Date  . CLEFT LIP REPAIR at age 42      Family History  Problem Relation Age of Onset  . Heart attack Mother   . Anxiety disorder Father   . Diabetes Father   . Depression Brother   . Anxiety disorder Brother   There is no family history of connective tissue disorder or aortic aneurysm.  There is no family history of valvular heart disease.  He said that his father does have some form of heart disease.  His 83 year old brother and his father both have hypertension.  Social History Social History   Tobacco Use  . Smoking status: Never Smoker  . Smokeless tobacco: Never Used  Substance Use  Topics  . Alcohol use: No    Alcohol/week: 0.0 standard drinks    Comment: pt states on occasion  . Drug use: No  He works at [employer] in [place].  He was recently laid off due to the coronavirus pandemic.   Current Outpatient Medications  Medication Sig Dispense Refill  . aspirin EC 81 MG tablet Take 81 mg by mouth daily.    . cetirizine-pseudoephedrine (ZYRTEC-D) 5-120 MG per tablet Take 1 tablet by mouth 2 (two) times daily.    Marland Kitchen labetalol (NORMODYNE) 100 MG tablet Take 1 tablet (100 mg total) by mouth 2 (two) times daily. 60 tablet 1   No current facility-administered medications for this visit.     No Known Allergies  Review of Systems  Constitutional: Negative for activity change, appetite change, diaphoresis, fatigue, fever and unexpected weight change.  HENT: Negative.   Eyes: Negative.   Respiratory: Negative.  Negative for shortness of breath.   Cardiovascular: Positive for chest pain and palpitations. Negative for leg swelling.  Gastrointestinal: Negative.   Endocrine: Negative.   Genitourinary: Negative.   Musculoskeletal: Negative.   Allergic/Immunologic: Negative.   Neurological: Positive for dizziness. Negative for syncope, weakness and numbness.  Hematological: Negative.   Psychiatric/Behavioral: Negative.     BP 111/85 (BP Location: Left Arm, Patient Position: Sitting, Cuff Size: Large)  Pulse (!) 108   Temp 98.4 F (36.9 C) (Temporal)   Resp 16   Ht 5\' 7"  (1.702 m)   Wt 191 lb (86.6 kg)   SpO2 97% Comment: ON RA  BMI 29.91 kg/m  Physical Exam Constitutional:      Appearance: Normal appearance. He is normal weight.  HENT:     Head: Normocephalic and atraumatic.     Mouth/Throat:     Mouth: Mucous membranes are moist.     Pharynx: Oropharynx is clear.     Comments: Teeth in good condition Eyes:     Extraocular Movements: Extraocular movements intact.     Conjunctiva/sclera: Conjunctivae normal.     Pupils: Pupils are equal, round, and  reactive to light.  Neck:     Musculoskeletal: Normal range of motion and neck supple.  Cardiovascular:     Rate and Rhythm: Normal rate and regular rhythm.     Pulses: Normal pulses.     Heart sounds: Normal heart sounds. No murmur.  Pulmonary:     Breath sounds: Normal breath sounds.  Abdominal:     General: Abdomen is flat.     Palpations: Abdomen is soft.  Musculoskeletal: Normal range of motion.        General: No swelling.  Skin:    General: Skin is warm.     Coloration: Skin is not jaundiced.  Neurological:     General: No focal deficit present.     Mental Status: He is alert and oriented to person, place, and time.  Psychiatric:        Mood and Affect: Mood normal.        Behavior: Behavior normal.        Thought Content: Thought content normal.        Judgment: Judgment normal.      Diagnostic Tests:  CLINICAL DATA:  Shortness of breath and chest pain  EXAM: CT ANGIOGRAPHY CHEST WITH CONTRAST  TECHNIQUE: Multidetector CT imaging of the chest was performed using the standard protocol during bolus administration of intravenous contrast. Multiplanar CT image reconstructions and MIPs were obtained to evaluate the vascular anatomy.  CONTRAST:  116mL OMNIPAQUE IOHEXOL 350 MG/ML SOLN  COMPARISON:  Chest radiograph September 13, 2018  FINDINGS: Cardiovascular: There is no demonstrable pulmonary embolus. There is dilatation of the ascending thoracic aorta. The maximum transverse diameter of the ascending aorta measures 5.2 x 5.1 cm. The aorta at the sinotubular junction measures 4.7 cm in diameter. There is no dilatation of the descending thoracic aorta. There is no evident dissection of the thoracic aorta. The visualized great vessels appear unremarkable. There are foci of coronary artery calcification. There is a small pericardial effusion. No pericardial thickening evident. No evident contrast extravasation in the mediastinum or pericardial region.   Mediastinum/Nodes: Thyroid appears unremarkable. There is no appreciable thoracic adenopathy. There is a fairly small hiatal hernia.  Lungs/Pleura: No blastic or lytic bone lesions are identified. There is slight left base atelectasis. No pleural effusion or pleural thickening evident.  Upper Abdomen: There is hepatic steatosis. Visualized upper abdominal structures otherwise appear unremarkable.  Musculoskeletal: No blastic or lytic bone lesions are evident. There are no appreciable chest wall lesions.  Review of the MIP images confirms the above findings.  IMPRESSION: 1. Ascending thoracic aortic aneurysm with a measured transverse diameter of the ascending aorta of 5.2 x 5.1 cm. Dilatation at the sinotubular junction with a measured diameter of 4.7 cm. Arch and descending aortic regions appear unremarkable. No  dissection. No demonstrable pulmonary embolus. Ascending thoracic aortic aneurysm. Recommend semi-annual imaging followup by CTA or MRA and referral to cardiothoracic surgery if not already obtained. This recommendation follows 2010 ACCF/AHA/AATS/ACR/ASA/SCA/SCAI/SIR/STS/SVM Guidelines for the Diagnosis and Management of Patients With Thoracic Aortic Disease. Circulation. 2010; 121: M638-T771. Aortic aneurysm NOS (ICD10-I71.9).  2.  Small pericardial effusion.  3.   Foci of coronary artery calcification noted.  4. No edema or consolidation. Slight left base atelectasis. No pleural effusion.  5.  No adenopathy evident.  6.  Hepatic steatosis.   Electronically Signed   By: Lowella Grip III M.D.   On: 09/13/2018 11:21   Impression:  This 28 year old gentleman presents with hypertensive emergency and substernal chest discomfort and has an ascending aortic aneurysm.  I have personally reviewed his CT scan and by my measurement the maximum diameter is about 5.5 cm in the mid ascending aorta.  There is no evidence of aortic dissection.  The aorta is  4.7 cm at the sinotubular junction.  He has no murmur on exam to suggest aortic insufficiency and there is no calcification in the aortic valve.  The diameter of the distal descending aorta is 2.3 cm.  I suspect that he may have some connective tissue disorder although there is no family history of connective tissue disorder or aortic aneurysm.  He did have a cleft lip and palate which has been associated with connective tissue disorder and aortic aneurysm disease at a young age in some cases.  This is certainly something that should be repaired in the near future given his young age and presentation with hypertensive emergency.  I reviewed the CTA images with him and answered his questions.  I stressed the importance of taking his labetalol and maintaining good blood pressure control to prevent aortic dissection.  He will require a 2D echocardiogram to evaluate the aortic valve and LV function.  He will also require a gated cardiac CTA to evaluate his coronary arteries.  I suspect that this will be amenable to a valve sparing aortic root and ascending aortic replacement.   Plan:  We will schedule a 2D echocardiogram and gated cardiac CTA.  I will see him back as soon as those are completed to review the results with him and plan surgery as soon as possible.   I spent 60 minutes performing this consultation and > 50% of this time was spent face to face counseling and coordinating the care of this patient's ascending aortic aneurysm.   Gaye Pollack, MD [facility] [phone]

## 2018-09-15 ENCOUNTER — Telehealth (HOSPITAL_COMMUNITY): Payer: Self-pay | Admitting: Emergency Medicine

## 2018-09-15 NOTE — Telephone Encounter (Signed)
Instructions sent via mychart message.

## 2018-09-16 ENCOUNTER — Ambulatory Visit (HOSPITAL_COMMUNITY)
Admission: RE | Admit: 2018-09-16 | Discharge: 2018-09-16 | Disposition: A | Discharge status: Home / Self Care | Payer: BLUE CROSS/BLUE SHIELD | Source: Ambulatory Visit | Attending: Surgery | Admitting: Surgery

## 2018-09-16 ENCOUNTER — Encounter (HOSPITAL_COMMUNITY): Payer: Self-pay | Admitting: Physician Assistant

## 2018-09-16 ENCOUNTER — Other Ambulatory Visit: Payer: Self-pay | Admitting: Physician Assistant

## 2018-09-16 ENCOUNTER — Other Ambulatory Visit: Payer: Self-pay

## 2018-09-16 ENCOUNTER — Inpatient Hospital Stay (HOSPITAL_COMMUNITY)
Admission: AD | Admit: 2018-09-16 | Discharge: 2018-09-27 | DRG: 219 | Disposition: A | Discharge status: Home / Self Care | Payer: BLUE CROSS/BLUE SHIELD | Source: Ambulatory Visit | Attending: Surgery | Admitting: Surgery

## 2018-09-16 ENCOUNTER — Ambulatory Visit (HOSPITAL_BASED_OUTPATIENT_CLINIC_OR_DEPARTMENT_OTHER)
Admission: RE | Admit: 2018-09-16 | Discharge: 2018-09-16 | Disposition: A | Discharge status: Home / Self Care | Payer: BLUE CROSS/BLUE SHIELD | Source: Ambulatory Visit | Attending: Surgery | Admitting: Surgery

## 2018-09-16 DIAGNOSIS — I313 Pericardial effusion (noninflammatory): Secondary | ICD-10-CM | POA: Diagnosis not present

## 2018-09-16 DIAGNOSIS — R079 Chest pain, unspecified: Secondary | ICD-10-CM

## 2018-09-16 DIAGNOSIS — Z1159 Encounter for screening for other viral diseases: Secondary | ICD-10-CM | POA: Diagnosis not present

## 2018-09-16 DIAGNOSIS — Q231 Congenital insufficiency of aortic valve: Principal | ICD-10-CM

## 2018-09-16 DIAGNOSIS — I358 Other nonrheumatic aortic valve disorders: Secondary | ICD-10-CM | POA: Diagnosis not present

## 2018-09-16 DIAGNOSIS — J181 Lobar pneumonia, unspecified organism: Secondary | ICD-10-CM

## 2018-09-16 DIAGNOSIS — Z952 Presence of prosthetic heart valve: Secondary | ICD-10-CM

## 2018-09-16 DIAGNOSIS — R0602 Shortness of breath: Secondary | ICD-10-CM | POA: Diagnosis not present

## 2018-09-16 DIAGNOSIS — G4700 Insomnia, unspecified: Secondary | ICD-10-CM | POA: Diagnosis present

## 2018-09-16 DIAGNOSIS — I7101 Dissection of thoracic aorta: Secondary | ICD-10-CM | POA: Diagnosis not present

## 2018-09-16 DIAGNOSIS — I712 Thoracic aortic aneurysm, without rupture, unspecified: Secondary | ICD-10-CM

## 2018-09-16 DIAGNOSIS — Z48812 Encounter for surgical aftercare following surgery on the circulatory system: Secondary | ICD-10-CM | POA: Diagnosis not present

## 2018-09-16 DIAGNOSIS — I719 Aortic aneurysm of unspecified site, without rupture: Secondary | ICD-10-CM | POA: Diagnosis present

## 2018-09-16 DIAGNOSIS — J309 Allergic rhinitis, unspecified: Secondary | ICD-10-CM | POA: Diagnosis not present

## 2018-09-16 DIAGNOSIS — Z8249 Family history of ischemic heart disease and other diseases of the circulatory system: Secondary | ICD-10-CM

## 2018-09-16 DIAGNOSIS — D62 Acute posthemorrhagic anemia: Secondary | ICD-10-CM | POA: Diagnosis not present

## 2018-09-16 DIAGNOSIS — Z9889 Other specified postprocedural states: Secondary | ICD-10-CM

## 2018-09-16 DIAGNOSIS — J984 Other disorders of lung: Secondary | ICD-10-CM | POA: Diagnosis not present

## 2018-09-16 DIAGNOSIS — Z01810 Encounter for preprocedural cardiovascular examination: Secondary | ICD-10-CM

## 2018-09-16 DIAGNOSIS — Z8679 Personal history of other diseases of the circulatory system: Secondary | ICD-10-CM

## 2018-09-16 DIAGNOSIS — R9389 Abnormal findings on diagnostic imaging of other specified body structures: Secondary | ICD-10-CM

## 2018-09-16 DIAGNOSIS — F419 Anxiety disorder, unspecified: Secondary | ICD-10-CM | POA: Diagnosis not present

## 2018-09-16 DIAGNOSIS — I10 Essential (primary) hypertension: Secondary | ICD-10-CM | POA: Diagnosis present

## 2018-09-16 DIAGNOSIS — I711 Thoracic aortic aneurysm, ruptured: Secondary | ICD-10-CM | POA: Diagnosis not present

## 2018-09-16 DIAGNOSIS — Z09 Encounter for follow-up examination after completed treatment for conditions other than malignant neoplasm: Secondary | ICD-10-CM

## 2018-09-16 DIAGNOSIS — I517 Cardiomegaly: Secondary | ICD-10-CM | POA: Diagnosis not present

## 2018-09-16 DIAGNOSIS — R918 Other nonspecific abnormal finding of lung field: Secondary | ICD-10-CM | POA: Diagnosis not present

## 2018-09-16 DIAGNOSIS — Z833 Family history of diabetes mellitus: Secondary | ICD-10-CM

## 2018-09-16 DIAGNOSIS — E876 Hypokalemia: Secondary | ICD-10-CM | POA: Diagnosis present

## 2018-09-16 DIAGNOSIS — G43909 Migraine, unspecified, not intractable, without status migrainosus: Secondary | ICD-10-CM | POA: Diagnosis not present

## 2018-09-16 DIAGNOSIS — Z95828 Presence of other vascular implants and grafts: Secondary | ICD-10-CM

## 2018-09-16 HISTORY — DX: Aortic aneurysm of unspecified site, without rupture: I71.9

## 2018-09-16 LAB — COMPREHENSIVE METABOLIC PANEL
ALT: 69 U/L — ABNORMAL HIGH (ref 0–44)
AST: 51 U/L — ABNORMAL HIGH (ref 15–41)
Albumin: 3.7 g/dL (ref 3.5–5.0)
Alkaline Phosphatase: 83 U/L (ref 38–126)
Anion gap: 12 (ref 5–15)
BUN: 8 mg/dL (ref 6–20)
CO2: 21 mmol/L — ABNORMAL LOW (ref 22–32)
Calcium: 9.2 mg/dL (ref 8.9–10.3)
Chloride: 104 mmol/L (ref 98–111)
Creatinine, Ser: 0.57 mg/dL — ABNORMAL LOW (ref 0.61–1.24)
GFR calc Af Amer: >60 mL/min (ref >60)
GFR calc non Af Amer: >60 mL/min (ref >60)
Glucose, Bld: 103 mg/dL — ABNORMAL HIGH (ref 70–99)
Potassium: 3.2 mmol/L — ABNORMAL LOW (ref 3.5–5.1)
Sodium: 137 mmol/L (ref 135–145)
Total Bilirubin: 0.8 mg/dL (ref 0.3–1.2)
Total Protein: 7.1 g/dL (ref 6.5–8.1)

## 2018-09-16 LAB — TYPE AND SCREEN
ABO/RH(D): O NEG
Antibody Screen: NEGATIVE

## 2018-09-16 LAB — CBC
HCT: 42 % (ref 39.0–52.0)
Hemoglobin: 14.7 g/dL (ref 13.0–17.0)
MCH: 34.4 pg — ABNORMAL HIGH (ref 26.0–34.0)
MCHC: 35 g/dL (ref 30.0–36.0)
MCV: 98.4 fL (ref 80.0–100.0)
Platelets: 214 10*3/uL (ref 150–400)
RBC: 4.27 MIL/uL (ref 4.22–5.81)
RDW: 12.9 % (ref 11.5–15.5)
WBC: 10.1 10*3/uL (ref 4.0–10.5)
nRBC: 0 % (ref 0.0–0.2)

## 2018-09-16 LAB — PROTIME-INR
INR: 1 (ref 0.8–1.2)
Prothrombin Time: 13.3 seconds (ref 11.4–15.2)

## 2018-09-16 LAB — HEMOGLOBIN A1C
Hgb A1c MFr Bld: 5.2 % (ref 4.8–5.6)
Mean Plasma Glucose: 102.54 mg/dL

## 2018-09-16 LAB — SURGICAL PCR SCREEN
MRSA, PCR: NEGATIVE
Staphylococcus aureus: POSITIVE — AB

## 2018-09-16 LAB — ABO/RH: ABO/RH(D): O NEG

## 2018-09-16 IMAGING — CT CT HEAR MORPH WITH CTA COR WITH SCORE WITH CA WITH CONTRAST AND
4 of 7 series · 8 of 20 positions shown, 9 images · IV contrast (APPLIED)
Comparison: None.
COMPARISON: None.
COMPARISON: None.

Addendum:
EXAM:
OVER-READ INTERPRETATION  CT CHEST

The following report is an over-read performed by radiologist Dr.
Darinel Vm [REDACTED] on 09/16/2018. This
over-read does not include interpretation of cardiac or coronary
anatomy or pathology. The coronary CTA interpretation by the
cardiologist is attached.
CLINICAL DATA: Chest pain
Cardiac CTA
MEDICATIONS:
Sub lingual nitro. 4mg x 2
TECHNIQUE: The patient was scanned on a Siemens [REDACTED]ice scanner. Gantry
rotation speed was 250 msecs. Collimation was 0.6 mm. A 100 kV
prospective scan was triggered in the ascending thoracic aorta at
35-75% of the R-R interval. Average HR during the scan was 60 bpm.
The 3D data set was interpreted on a dedicated work station using
MPR, MIP and VRT modes. A total of 80cc of contrast was used.

[Series 6: best diast 76 % · axial · 0.42mm/px · z∈[+1115,+1183]mm · 2 of 511 slices shown]
[im 171/511  vessel]
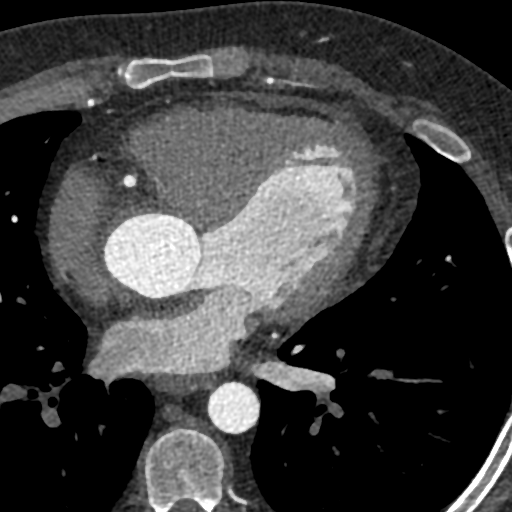
[im 341/511  vessel]
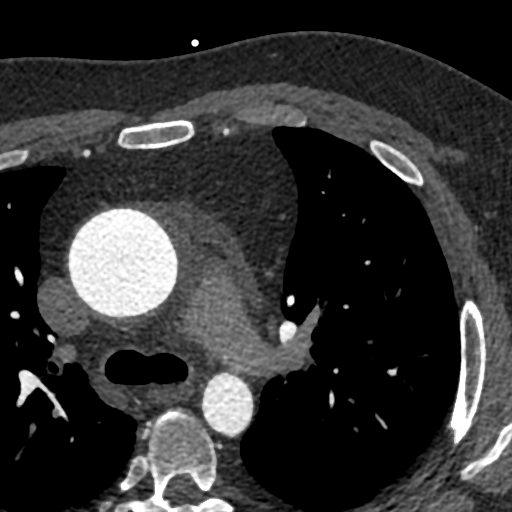

[Series 7: best syst · axial · 0.42mm/px · z∈[+1115,+1183]mm · 2 of 511 slices shown, 3 images]
[im 171/511  vessel]
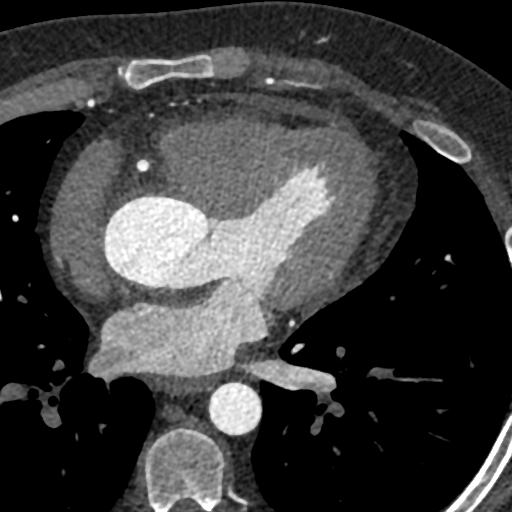
[im 171/511  lung]
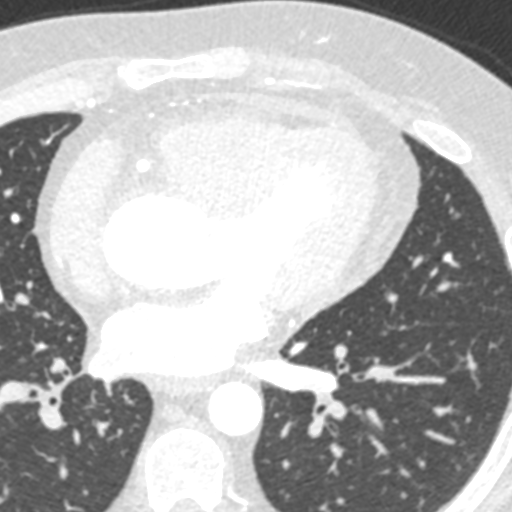
[im 341/511  vessel]
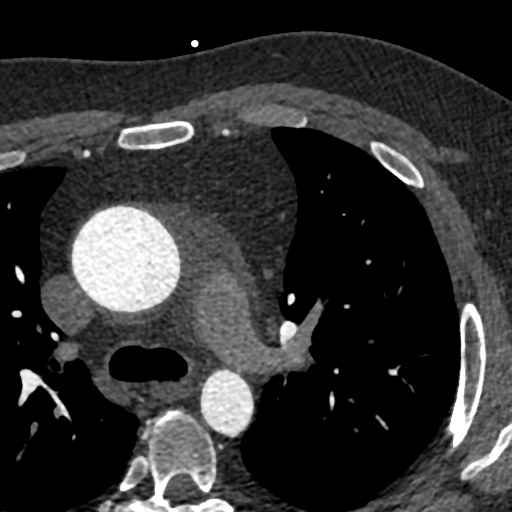

[Series 8: ts diast sharp 76 % · axial · 0.42mm/px · z∈[+1115,+1183]mm · 2 of 511 slices shown]
[im 171/511  lung]
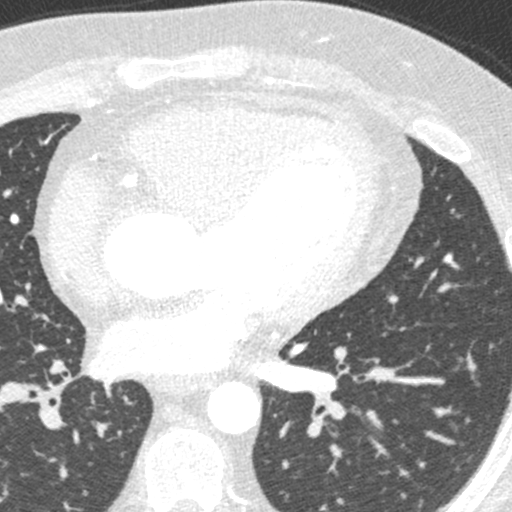
[im 341/511  lung]
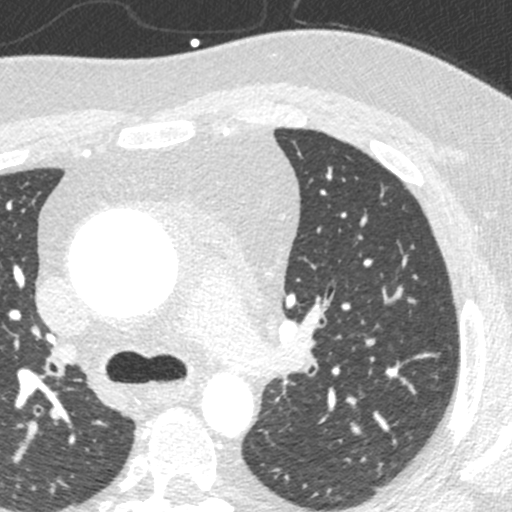

[Series 9: ts syst sharp · axial · 0.42mm/px · z∈[+1115,+1183]mm · 2 of 511 slices shown]
[im 171/511  lung]
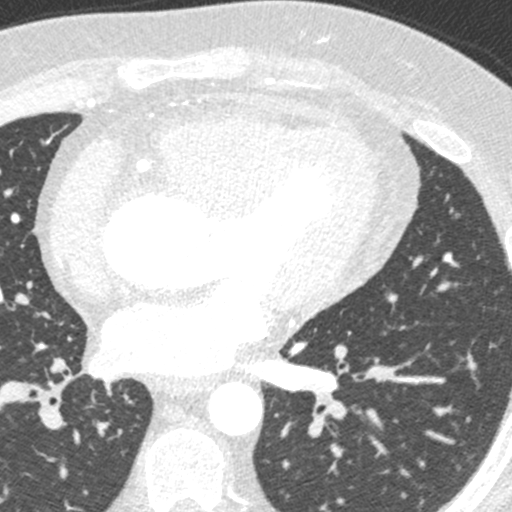
[im 341/511  lung]
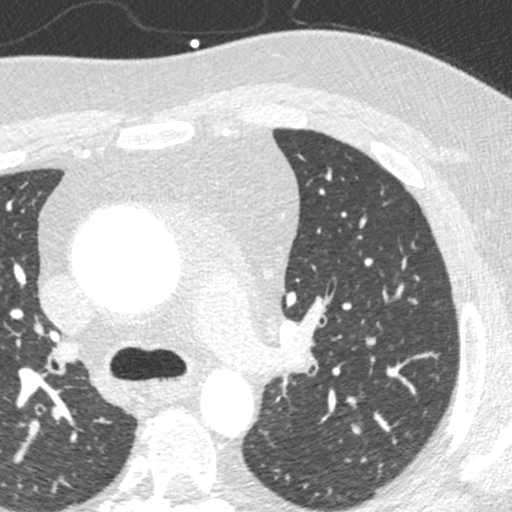

[8 of 20 positions shown; findings below may reference images not displayed]

FINDINGS: Vascular: Significant ascending thoracic aortic aneurysm again noted
measuring up to 5.7-5.8 cm and associated with partial dissection
flap along the anteromedial aspect of the ascending thoracic aorta
distal to the aortic root. The aortic root measures approximately 5
cm. There remains some stranding around the ascending thoracic aorta
which appears fairly stable compared to the CTA on 09/13/2018. Some
contrast lateral to the ascending aorta may be some residual
contrast in the right atrial appendage.

Mediastinum/Nodes: Visualized mediastinum and hilar regions
demonstrate some tiny subcarinal lymph nodes and no enlarged lymph
nodes.

Lungs/Pleura: Visualized lungs show no evidence of pulmonary edema,
consolidation, pneumothorax, nodule or pleural fluid.

Upper Abdomen: Small hiatal hernia.

Musculoskeletal: No chest wall mass or suspicious bone lesions
identified.
IMPRESSION: Aneurysmal disease of the aortic root and ascending thoracic aorta
with the ascending thoracic aorta measuring up to 5.7-5.8 cm.
Localized partial dissection of the anteromedial ascending thoracic
aorta noted. Findings were discussed with Dr. Melesio with Cardiology
and Jhimmi Payano with Cardiothoracic Surgery.
FINDINGS: Non-cardiac: See separate report from [REDACTED].

Pulmonary veins drain normally to the left atrium. The aortic valve
appears bicuspid. Aneurysmal aortic root/ascending aorta with
dissection flap anteromedial ascending aorta as described in
radiology over-read. There appeared to be residual contrast in the
right atrial appendage tip.

Calcium Score: 476 Agatston units.

Coronary Arteries: Right dominant with no anomalies. No involvement
of the coronaries by the dissection.

LM: Short, no plaque or stenosis.

LAD system: Mixed plaque proximal LAD, mild (<50%) stenosis. Heavy
calcification in the mid LAD with an area just proximal to the
take-off of D1 that is concerning for moderate-severe stenosis.
However, cannot rule out artifact due to shadowing from adjacent
calcification. D1 is moderate and branching with calcified plaque
proximally, suspect mild (<50%) stenosis.

Circumflex system: Mixed plaque ostial LCx, mild (<50%) stenosis.

RCA system: Calcified plaque distal RCA/ostial PDA and mid PDA with
mild (<50%) stenosis.
IMPRESSION: 1. Ascending aorta/root aneurysm with dissection flap as described
in radiology report.

2.  Suspect bicuspid aortic valve.

3. Coronary artery calcium score 476 Agatston units, placing the
patient in the 99th percentile for age and gender, suggesting high
risk for future cardiac events. Age advanced coronary disease.

4.  Nonobstructive coronary disease in RCA, LCx, and left main.

5. Extensive calcified plaque in the proximal to mid LAD. There is
concern for moderate-severe mid LAD stenosis but may be artifact
from shadowing from adjacent calcification. Will send for FFR to
confirm.

Angi Billiot

ADDENDUM:
CT FFR was done.

FFR 0.83 mid LAD

FFR 0.84 mid D1

This would suggest that the LAD and D1 disease is not
hemodynamically significant.

*** End of Addendum ***
Addendum:
EXAM:
OVER-READ INTERPRETATION  CT CHEST

The following report is an over-read performed by radiologist Dr.
Darinel Vm [REDACTED] on 09/16/2018. This
over-read does not include interpretation of cardiac or coronary
anatomy or pathology. The coronary CTA interpretation by the
cardiologist is attached.
FINDINGS: Vascular: Significant ascending thoracic aortic aneurysm again noted
measuring up to 5.7-5.8 cm and associated with partial dissection
flap along the anteromedial aspect of the ascending thoracic aorta
distal to the aortic root. The aortic root measures approximately 5
cm. There remains some stranding around the ascending thoracic aorta
which appears fairly stable compared to the CTA on 09/13/2018. Some
contrast lateral to the ascending aorta may be some residual
contrast in the right atrial appendage.

Mediastinum/Nodes: Visualized mediastinum and hilar regions
demonstrate some tiny subcarinal lymph nodes and no enlarged lymph
nodes.

Lungs/Pleura: Visualized lungs show no evidence of pulmonary edema,
consolidation, pneumothorax, nodule or pleural fluid.

Upper Abdomen: Small hiatal hernia.

Musculoskeletal: No chest wall mass or suspicious bone lesions
identified.
IMPRESSION: Aneurysmal disease of the aortic root and ascending thoracic aorta
with the ascending thoracic aorta measuring up to 5.7-5.8 cm.
Localized partial dissection of the anteromedial ascending thoracic
aorta noted. Findings were discussed with Dr. Melesio with Cardiology
and Jhimmi Payano with Cardiothoracic Surgery.
FINDINGS: Non-cardiac: See separate report from [REDACTED].

Pulmonary veins drain normally to the left atrium. The aortic valve
appears bicuspid. Aneurysmal aortic root/ascending aorta with
dissection flap anteromedial ascending aorta as described in
radiology over-read. There appeared to be residual contrast in the
right atrial appendage tip.

Calcium Score: 476 Agatston units.

Coronary Arteries: Right dominant with no anomalies. No involvement
of the coronaries by the dissection.

LM: Short, no plaque or stenosis.

LAD system: Mixed plaque proximal LAD, mild (<50%) stenosis. Heavy
calcification in the mid LAD with an area just proximal to the
take-off of D1 that is concerning for moderate-severe stenosis.
However, cannot rule out artifact due to shadowing from adjacent
calcification. D1 is moderate and branching with calcified plaque
proximally, suspect mild (<50%) stenosis.

Circumflex system: Mixed plaque ostial LCx, mild (<50%) stenosis.

RCA system: Calcified plaque distal RCA/ostial PDA and mid PDA with
mild (<50%) stenosis.
IMPRESSION: 1. Ascending aorta/root aneurysm with dissection flap as described
in radiology report.

2.  Suspect bicuspid aortic valve.

3. Coronary artery calcium score 476 Agatston units, placing the
patient in the 99th percentile for age and gender, suggesting high
risk for future cardiac events. Age advanced coronary disease.

4.  Nonobstructive coronary disease in RCA, LCx, and left main.

5. Extensive calcified plaque in the proximal to mid LAD. There is
concern for moderate-severe mid LAD stenosis but may be artifact
from shadowing from adjacent calcification. Will send for FFR to
confirm.

Angi Billiot

*** End of Addendum ***
EXAM:
OVER-READ INTERPRETATION  CT CHEST

The following report is an over-read performed by radiologist Dr.
Darinel Vm [REDACTED] on 09/16/2018. This
over-read does not include interpretation of cardiac or coronary
anatomy or pathology. The coronary CTA interpretation by the
cardiologist is attached.
FINDINGS: Vascular: Significant ascending thoracic aortic aneurysm again noted
measuring up to 5.7-5.8 cm and associated with partial dissection
flap along the anteromedial aspect of the ascending thoracic aorta
distal to the aortic root. The aortic root measures approximately 5
cm. There remains some stranding around the ascending thoracic aorta
which appears fairly stable compared to the CTA on 09/13/2018. Some
contrast lateral to the ascending aorta may be some residual
contrast in the right atrial appendage.

Mediastinum/Nodes: Visualized mediastinum and hilar regions
demonstrate some tiny subcarinal lymph nodes and no enlarged lymph
nodes.

Lungs/Pleura: Visualized lungs show no evidence of pulmonary edema,
consolidation, pneumothorax, nodule or pleural fluid.

Upper Abdomen: Small hiatal hernia.

Musculoskeletal: No chest wall mass or suspicious bone lesions
identified.
IMPRESSION: Aneurysmal disease of the aortic root and ascending thoracic aorta
with the ascending thoracic aorta measuring up to 5.7-5.8 cm.
Localized partial dissection of the anteromedial ascending thoracic
aorta noted. Findings were discussed with Dr. Melesio with Cardiology
and Jhimmi Payano with Cardiothoracic Surgery.

## 2018-09-16 MED ORDER — MUPIROCIN 2 % EX OINT
1.0000 "application " | TOPICAL_OINTMENT | Freq: Two times a day (BID) | CUTANEOUS | Status: AC
  Administered 2018-09-16 – 2018-09-20 (×8): 1 via NASAL
  Filled 2018-09-16 (×2): qty 22

## 2018-09-16 MED ORDER — ACETAMINOPHEN 325 MG PO TABS: 650.0000 mg | ORAL_TABLET | Freq: Four times a day (QID) | ORAL | Status: DC | PRN

## 2018-09-16 MED ORDER — METOPROLOL TARTRATE 5 MG/5ML IV SOLN
5.0000 mg | INTRAVENOUS | Status: DC | PRN
  Administered 2018-09-16 (×4): 5 mg via INTRAVENOUS

## 2018-09-16 MED ORDER — DIPHENHYDRAMINE HCL 25 MG PO CAPS: 25.0000 mg | ORAL_CAPSULE | Freq: Every evening | ORAL | Status: DC | PRN

## 2018-09-16 MED ORDER — LABETALOL HCL 100 MG PO TABS
100.0000 mg | ORAL_TABLET | Freq: Two times a day (BID) | ORAL | Status: DC
  Administered 2018-09-16 – 2018-09-18 (×5): 100 mg via ORAL
  Filled 2018-09-16 (×5): qty 1

## 2018-09-16 MED ORDER — ENOXAPARIN SODIUM 30 MG/0.3ML ~~LOC~~ SOLN
30.0000 mg | SUBCUTANEOUS | Status: DC
  Administered 2018-09-16 – 2018-09-18 (×3): 30 mg via SUBCUTANEOUS
  Filled 2018-09-16 (×3): qty 0.3

## 2018-09-16 MED ORDER — METOPROLOL TARTRATE 5 MG/5ML IV SOLN
INTRAVENOUS | Status: AC
  Filled 2018-09-16: qty 5

## 2018-09-16 MED ORDER — DILTIAZEM HCL 25 MG/5ML IV SOLN
INTRAVENOUS | Status: AC
  Filled 2018-09-16: qty 5

## 2018-09-16 MED ORDER — NITROGLYCERIN 0.4 MG SL SUBL
SUBLINGUAL_TABLET | SUBLINGUAL | Status: AC
  Filled 2018-09-16: qty 1

## 2018-09-16 MED ORDER — POTASSIUM CHLORIDE CRYS ER 20 MEQ PO TBCR
40.0000 meq | EXTENDED_RELEASE_TABLET | ORAL | Status: AC
  Administered 2018-09-16 (×2): 40 meq via ORAL
  Filled 2018-09-16 (×2): qty 2

## 2018-09-16 MED ORDER — LORATADINE 10 MG PO TABS
10.0000 mg | ORAL_TABLET | Freq: Every day | ORAL | Status: DC
  Administered 2018-09-17 – 2018-09-18 (×2): 10 mg via ORAL
  Filled 2018-09-16 (×2): qty 1

## 2018-09-16 MED ORDER — TEMAZEPAM 15 MG PO CAPS
15.0000 mg | ORAL_CAPSULE | Freq: Every evening | ORAL | Status: DC | PRN
  Administered 2018-09-16 – 2018-09-18 (×3): 15 mg via ORAL
  Filled 2018-09-16 (×3): qty 1

## 2018-09-16 MED ORDER — NITROGLYCERIN 0.4 MG SL SUBL
0.8000 mg | SUBLINGUAL_TABLET | Freq: Once | SUBLINGUAL | Status: AC
  Administered 2018-09-16: 0.4 mg via SUBLINGUAL

## 2018-09-16 MED ORDER — METOPROLOL TARTRATE 5 MG/5ML IV SOLN
5.0000 mg | INTRAVENOUS | Status: DC | PRN
  Administered 2018-09-16: 5 mg via INTRAVENOUS

## 2018-09-16 MED ORDER — TRAMADOL HCL 50 MG PO TABS: 50.0000 mg | ORAL_TABLET | Freq: Four times a day (QID) | ORAL | Status: DC | PRN

## 2018-09-16 MED ORDER — METOPROLOL TARTRATE 5 MG/5ML IV SOLN
INTRAVENOUS | Status: AC
  Filled 2018-09-16: qty 20

## 2018-09-16 MED ORDER — IOHEXOL 350 MG/ML SOLN
80.0000 mL | Freq: Once | INTRAVENOUS | Status: AC | PRN
  Administered 2018-09-16: 80 mL via INTRAVENOUS

## 2018-09-16 MED ORDER — DILTIAZEM HCL 25 MG/5ML IV SOLN
10.0000 mg | Freq: Once | INTRAVENOUS | Status: AC
  Administered 2018-09-16: 10 mg via INTRAVENOUS
  Filled 2018-09-16: qty 5

## 2018-09-16 MED ORDER — ALPRAZOLAM 0.25 MG PO TABS
0.2500 mg | ORAL_TABLET | Freq: Three times a day (TID) | ORAL | Status: DC | PRN
  Administered 2018-09-16 – 2018-09-18 (×5): 0.25 mg via ORAL
  Filled 2018-09-16 (×5): qty 1

## 2018-09-16 NOTE — Progress Notes (Signed)
  Refer to H and P

## 2018-09-16 NOTE — H&P (Signed)
[address]       [place] [address]             [phone]        Fred R Frieden Delavan Medical Record #[id number] Date of Birth: 03-21-1991  Referring: No ref. provider found Primary Care: Guadalupe Maple, MD Primary Cardiologist:No primary care provider on file.  Chief Complaint:   Chest pain   History of Present Illness:      The patient is a 28 year old gentleman with a history of anxiety who was woken up by chest pain Tuesday morning.  He described this as left-sided, fairly severe, and accompanied by diaphoresis.  He had had occasional episodes of chest discomfort in the past.  He presented to the [facility] emergency room where his blood pressure was 155/114 and his heart rate was 128.  Chest x-ray was unremarkable.  He had a CTA of the chest which showed an ascending aortic aneurysm with a diameter of 4.7 cm at the sinotubular junction and 5.2 x 5.1 cm in the mid a sending aorta.  There is no evidence of aortic dissection.  There is a small pericardial effusion.  There is no pulmonary embolism.  His troponin was less than 0.03.  His hypertension was treated in the emergency room with IV labetalol.  He was seen by Dr. Bronson Ing in consultation and he was started on labetalol 100 mg twice daily.  He said that he has taken 2 doses of this so far and has had no further chest discomfort. He was seen in Dr. Vivi Martens office on Wed 4/22 and he ordered a 2D echocardiogram and gated cardiac CTA. He underwent a CTA on 4/22 which showed that his pulmonary veins drain normally to the left atrium. The aortic valve appears bicuspid. Aneurysmal aortic root/ascending aorta with dissection flap anteromedial ascending aorta as described in radiology over-read. There appeared to be residual contrast in the right atrial appendage tip. We are admitting the patient for tight blood pressure control. He has been scheduled for a Aortic root replacement, +/- aortic valve  replacement, +/- LIMA to LAD bypass.  The patient works at [employer] in [place] preserving [place] for dissecting. He has been working there 5 years and has temporarily been laid off.  He does have a family history of "heart disease" in his great grandfather and grandfather. His father has valvular disease and has been to [place] for treatment.    Current Activity/ Functional Status: Patient was independent with mobility/ambulation, transfers, ADL's, IADL's.   Zubrod Score: At the time of surgery this patients most appropriate activity status/level should be described as: [x]     0    Normal activity, no symptoms []     1    Restricted in physical strenuous activity but ambulatory, able to do out light work []     2    Ambulatory and capable of self care, unable to do work activities, up and about                 more than 50%  Of the time                            []     3    Only limited self care, in bed greater than 50% of waking hours []     4    Completely disabled, no self care, confined to bed  or chair []     5    Moribund  Past Medical History:  Diagnosis Date   Anxiety     Past Surgical History:  Procedure Laterality Date   CLEFT LIP REPAIR      Social History   Tobacco Use  Smoking Status Never Smoker  Smokeless Tobacco Never Used    Social History   Substance and Sexual Activity  Alcohol Use No   Alcohol/week: 0.0 standard drinks   Comment: pt states on occasion     No Known Allergies  Current Facility-Administered Medications  Medication Dose Route Frequency Provider Last Rate Last Dose   enoxaparin (LOVENOX) injection 30 mg  30 mg Subcutaneous Q24H Kapena Hamme N, PA-C       Facility-Administered Medications Ordered in Other Encounters  Medication Dose Route Frequency Provider Last Rate Last Dose   diltiazem (CARDIZEM) 25 MG/5ML injection            metoprolol tartrate (LOPRESSOR) 5 MG/5ML injection            metoprolol  tartrate (LOPRESSOR) 5 MG/5ML injection            metoprolol tartrate (LOPRESSOR) injection 5 mg  5 mg Intravenous Q5 min PRN Dorothy Spark, MD   5 mg at 09/16/18 1029   metoprolol tartrate (LOPRESSOR) injection 5 mg  5 mg Intravenous Q5 min PRN Dorothy Spark, MD   5 mg at 09/16/18 1029   nitroGLYCERIN (NITROSTAT) 0.4 MG SL tablet             Medications Prior to Admission  Medication Sig Dispense Refill Last Dose   acetaminophen (TYLENOL) 325 MG tablet Take 650 mg by mouth every 6 (six) hours as needed (for pain.).      cetirizine (ZYRTEC) 10 MG tablet Take 10 mg by mouth daily as needed for allergies.      labetalol (NORMODYNE) 100 MG tablet Take 1 tablet (100 mg total) by mouth 2 (two) times daily. 60 tablet 1 Taking    Family History  Problem Relation Age of Onset   Heart attack Mother    Anxiety disorder Father    Diabetes Father    Depression Brother    Anxiety disorder Brother      Review of Systems:   Review of Systems  Constitutional: Negative for chills, fever and malaise/fatigue.  HENT: Positive for hearing loss (when he initally had his chest pain).   Respiratory: Positive for cough. Negative for sputum production, shortness of breath and wheezing.   Cardiovascular: Positive for chest pain. Negative for orthopnea, leg swelling and PND.  Gastrointestinal: Negative for heartburn, nausea and vomiting.  Musculoskeletal: Negative for myalgias.  Psychiatric/Behavioral: Negative for depression. The patient is nervous/anxious.    Pertinent items are noted in HPI.       Physical Exam: BP (!) 128/96 (BP Location: Left Arm)    Pulse 93    Resp (!) 25    SpO2 97%    General appearance: alert, cooperative and no distress Resp: clear to auscultation bilaterally Cardio: regular rate and rhythm, S1, S2 normal, no murmur, click, rub or gallop GI: soft, non-tender; bowel sounds normal; no masses,  no organomegaly Extremities: extremities normal,  atraumatic, no cyanosis or edema Neurologic: Grossly normal  Diagnostic Studies & Laboratory data:     Recent Radiology Findings:   Ct Coronary Morph W/cta Cor W/score W/ca W/cm &/or Wo/cm  Addendum Date: 09/16/2018   ADDENDUM REPORT: 09/16/2018 12:22 CLINICAL DATA:  Chest pain  EXAM: Cardiac CTA MEDICATIONS: Sub lingual nitro. 4mg  x 2 TECHNIQUE: The patient was scanned on a Siemens 628 slice scanner. Gantry rotation speed was 250 msecs. Collimation was 0.6 mm. A 100 kV prospective scan was triggered in the ascending thoracic aorta at 35-75% of the R-R interval. Average HR during the scan was 60 bpm. The 3D data set was interpreted on a dedicated work station using MPR, MIP and VRT modes. A total of 80cc of contrast was used. FINDINGS: Non-cardiac: See separate report from [facility]. Pulmonary veins drain normally to the left atrium. The aortic valve appears bicuspid. Aneurysmal aortic root/ascending aorta with dissection flap anteromedial ascending aorta as described in radiology over-read. There appeared to be residual contrast in the right atrial appendage tip. Calcium Score: 476 Agatston units. Coronary Arteries: Right dominant with no anomalies. No involvement of the coronaries by the dissection. LM: Short, no plaque or stenosis. LAD system: Mixed plaque proximal LAD, mild (<50%) stenosis. Heavy calcification in the mid LAD with an area just proximal to the take-off of D1 that is concerning for moderate-severe stenosis. However, cannot rule out artifact due to shadowing from adjacent calcification. D1 is moderate and branching with calcified plaque proximally, suspect mild (<50%) stenosis. Circumflex system: Mixed plaque ostial LCx, mild (<50%) stenosis. RCA system: Calcified plaque distal RCA/ostial PDA and mid PDA with mild (<50%) stenosis. IMPRESSION: 1. Ascending aorta/root aneurysm with dissection flap as described in radiology report. 2.  Suspect bicuspid aortic valve. 3. Coronary artery  calcium score 476 Agatston units, placing the patient in the 99th percentile for age and gender, suggesting high risk for future cardiac events. Age advanced coronary disease. 4.  Nonobstructive coronary disease in RCA, LCx, and left main. 5. Extensive calcified plaque in the proximal to mid LAD. There is concern for moderate-severe mid LAD stenosis but may be artifact from shadowing from adjacent calcification. Will send for FFR to confirm. Dalton Mclean Electronically Signed   By: Loralie Champagne M.D.   On: 09/16/2018 12:22   Result Date: 09/16/2018 EXAM: OVER-READ INTERPRETATION  CT CHEST The following report is an over-read performed by radiologist Dr. Aletta Edouard of [facility], [place] on 09/16/2018. This over-read does not include interpretation of cardiac or coronary anatomy or pathology. The coronary CTA interpretation by the cardiologist is attached. COMPARISON:  None. FINDINGS: Vascular: Significant ascending thoracic aortic aneurysm again noted measuring up to 5.7-5.8 cm and associated with partial dissection flap along the anteromedial aspect of the ascending thoracic aorta distal to the aortic root. The aortic root measures approximately 5 cm. There remains some stranding around the ascending thoracic aorta which appears fairly stable compared to the CTA on 09/13/2018. Some contrast lateral to the ascending aorta may be some residual contrast in the right atrial appendage. Mediastinum/Nodes: Visualized mediastinum and hilar regions demonstrate some tiny subcarinal lymph nodes and no enlarged lymph nodes. Lungs/Pleura: Visualized lungs show no evidence of pulmonary edema, consolidation, pneumothorax, nodule or pleural fluid. Upper Abdomen: Small hiatal hernia. Musculoskeletal: No chest wall mass or suspicious bone lesions identified. IMPRESSION: Aneurysmal disease of the aortic root and ascending thoracic aorta with the ascending thoracic aorta measuring up to 5.7-5.8 cm. Localized partial  dissection of the anteromedial ascending thoracic aorta noted. Findings were discussed with Dr. Meda Coffee with Cardiology and Nicholes Rough PA-C with Cardiothoracic Surgery. Electronically Signed: By: Aletta Edouard M.D. On: 09/16/2018 11:37     I have independently reviewed the above radiologic studies and discussed with the patient   Recent Lab  Findings: Lab Results  Component Value Date   WBC 11.9 (H) 09/13/2018   HGB 17.0 09/13/2018   HCT 50.4 09/13/2018   PLT 293 09/13/2018   GLUCOSE 166 (H) 09/13/2018   ALT 73 (H) 10/28/2016   AST 35 10/28/2016   NA 137 09/13/2018   K 3.2 (L) 09/13/2018   CL 102 09/13/2018   CREATININE 0.70 09/13/2018   BUN 10 09/13/2018   CO2 16 (L) 09/13/2018      Assessment / Plan:      1. Ascending Aortic Aneurysm with dissection flap near the anteromedial ascending aorta- stable, no current chest pain. BP currently well controlled 108/84, 115/90, and 128/96 were the last three readings. Continue Labetalol 100mg  BID.  2. Anxiety- Not on any current medication 3. Insomnia-PO benadryl ordered for tonight PRN.  4. Possible LAD disease-further workup is indicated. FFR being sent by Dr. Aundra Dubin.   Plan: Discussed Aortic root replacement, +/- Aortic valve replacement, +/- LIMA to LAD bypass graft with the patient. All questions were answered to the patient's satisfaction. Plan for surgery with Dr. Cyndia Bent Monday, April 27th.   I  spent 40 minutes counseling the patient face to face.    Nicholes Rough, PA-C  09/16/2018 2:13 PM

## 2018-09-16 NOTE — Progress Notes (Signed)
CT scan completed. Tolerated well. D/C home walking, awake and alert. In no distress. 

## 2018-09-16 NOTE — Progress Notes (Signed)
  Echocardiogram 2D Echocardiogram has been performed.  Henry Duncan 09/16/2018, 9:07 AM

## 2018-09-16 NOTE — Progress Notes (Signed)
Pt's blood pressure throughout current shift has ranged from 762-263 systolic to 335-456 diastolic. Pt resting comfortably with no complaints of pain. MD notified. No new orders at this time. No changes to current blood pressure parameters.  RN will continue to monitor pt closely.

## 2018-09-17 ENCOUNTER — Inpatient Hospital Stay (HOSPITAL_COMMUNITY): Payer: BLUE CROSS/BLUE SHIELD

## 2018-09-17 DIAGNOSIS — Z01810 Encounter for preprocedural cardiovascular examination: Secondary | ICD-10-CM

## 2018-09-17 LAB — CBC
HCT: 43.4 % (ref 39.0–52.0)
Hemoglobin: 15.2 g/dL (ref 13.0–17.0)
MCH: 35 pg — ABNORMAL HIGH (ref 26.0–34.0)
MCHC: 35 g/dL (ref 30.0–36.0)
MCV: 100 fL (ref 80.0–100.0)
Platelets: 202 10*3/uL (ref 150–400)
RBC: 4.34 MIL/uL (ref 4.22–5.81)
RDW: 12.8 % (ref 11.5–15.5)
WBC: 10.6 10*3/uL — ABNORMAL HIGH (ref 4.0–10.5)
nRBC: 0 % (ref 0.0–0.2)

## 2018-09-17 LAB — BASIC METABOLIC PANEL
Anion gap: 13 (ref 5–15)
BUN: 8 mg/dL (ref 6–20)
CO2: 23 mmol/L (ref 22–32)
Calcium: 9.2 mg/dL (ref 8.9–10.3)
Chloride: 100 mmol/L (ref 98–111)
Creatinine, Ser: 0.74 mg/dL (ref 0.61–1.24)
GFR calc Af Amer: >60 mL/min (ref >60)
GFR calc non Af Amer: >60 mL/min (ref >60)
Glucose, Bld: 94 mg/dL (ref 70–99)
Potassium: 3.8 mmol/L (ref 3.5–5.1)
Sodium: 136 mmol/L (ref 135–145)

## 2018-09-17 LAB — SARS CORONAVIRUS 2 BY RT PCR (HOSPITAL ORDER, PERFORMED IN ~~LOC~~ HOSPITAL LAB): SARS Coronavirus 2: NEGATIVE

## 2018-09-17 MED ORDER — LABETALOL HCL 5 MG/ML IV SOLN
10.0000 mg | INTRAVENOUS | Status: DC | PRN
  Administered 2018-09-17 – 2018-09-18 (×2): 10 mg via INTRAVENOUS
  Filled 2018-09-17 (×2): qty 4

## 2018-09-17 NOTE — Progress Notes (Signed)
VASCULAR LAB PRELIMINARY  PRELIMINARY  PRELIMINARY  PRELIMINARY  Pre CABG Doppler completed.    Preliminary report:  See CV Proc for preliminary results.   Vayla Wilhelmi, RVT 09/17/2018, 4:15 PM

## 2018-09-17 NOTE — Progress Notes (Signed)
Cardiac Rehab Advisory Cardiac Rehab Phase I is not seeing pts face to face at this time due to Covid 19 restrictions. Ambulation is occurring through nursing, PT, and mobility teams. We will help facilitate that process as needed. We are calling pts in their rooms and discussing education. We will then deliver education materials to pts RN for delivery to pt.   Spoke with pt by phone. Discussed sternal precautions, IS, mobility post op, and d/c planning. Good reception. Delivered his OHS book and careguide and IS to RN who will give it to him later. Also gave him preop video to watch. He lives with his family and will have care at d/c.  [id number] [name] CES, ACSM 10:40 AM 09/17/2018

## 2018-09-17 NOTE — Progress Notes (Signed)
      [address]       [place] [address]             [phone]      No complaints  BP (!) 118/101 (BP Location: Left Arm)   Pulse 95   Temp 98.2 F (36.8 C) (Oral)   Resp (!) 23   Ht 5\' 7"  (1.702 m)   Wt 82.9 kg   SpO2 95%   BMI 28.62 kg/m   Carotids Ok  Henry Duncan C. Roxan Hockey, MD [facility] [phone]

## 2018-09-17 NOTE — Progress Notes (Signed)
Procedure(s) (LRB): BENTALL PROCEDURE OR VALVE SPARING ROOT REPLACEMENT (N/A) TRANSESOPHAGEAL ECHOCARDIOGRAM (TEE) (N/A) Subjective: No complaints this AM  Objective: Vital signs in last 24 hours: Temp:  [97.8 F (36.6 C)-98.6 F (37 C)] 98.2 F (36.8 C) (04/25 0700) Pulse Rate:  [73-95] 95 (04/24 1700) Cardiac Rhythm: Normal sinus rhythm (04/25 0400) Resp:  [16-28] 23 (04/25 0700) BP: (107-143)/(84-111) 125/101 (04/25 0700) SpO2:  [88 %-99 %] 95 % (04/25 0700) Weight:  [82.5 kg-82.9 kg] 82.9 kg (04/25 0500)  Hemodynamic parameters for last 24 hours:    Intake/Output from previous day: 04/24 0701 - 04/25 0700 In: 720 [P.O.:720] Out: 550 [Urine:550] Intake/Output this shift: No intake/output data recorded.  General appearance: alert, cooperative and no distress Neurologic: intact Heart: regular rate and rhythm Lungs: clear to auscultation bilaterally  Lab Results: Recent Labs    09/16/18 1526 09/17/18 0224  WBC 10.1 10.6*  HGB 14.7 15.2  HCT 42.0 43.4  PLT 214 202   BMET:  Recent Labs    09/16/18 1526 09/17/18 0224  NA 137 136  K 3.2* 3.8  CL 104 100  CO2 21* 23  GLUCOSE 103* 94  BUN 8 8  CREATININE 0.57* 0.74  CALCIUM 9.2 9.2    PT/INR:  Recent Labs    09/16/18 1526  LABPROT 13.3  INR 1.0   ABG No results found for: PHART, HCO3, TCO2, ACIDBASEDEF, O2SAT CBG (last 3)  No results for input(s): GLUCAP in the last 72 hours.  Assessment/Plan: S/P Procedure(s) (LRB): BENTALL PROCEDURE OR VALVE SPARING ROOT REPLACEMENT (N/A) TRANSESOPHAGEAL ECHOCARDIOGRAM (TEE) (N/A) -Overall doing well BP mildly elevated with narrow pulses pressure Will continue labetalol PO with PRN IV labetalol to keep SBP< 140 Small infiltrate LUL on CXR, cough for last 2 days, no fever  Will repeat CXR tomorrow  Check COVID Plan for repair on Monday   LOS: 1 day    Melrose Nakayama 09/17/2018

## 2018-09-17 NOTE — Progress Notes (Signed)
Paged Infection Prevention nurse.  Spoke with Barbera Setters and told her that pt's covid test was negative.  Sheri confirmed that it is ok to d/c airborne precautions at this time.

## 2018-09-18 ENCOUNTER — Inpatient Hospital Stay (HOSPITAL_COMMUNITY): Payer: BLUE CROSS/BLUE SHIELD

## 2018-09-18 MED ORDER — CHLORHEXIDINE GLUCONATE CLOTH 2 % EX PADS
6.0000 | MEDICATED_PAD | Freq: Once | CUTANEOUS | Status: AC
  Administered 2018-09-18: 21:00:00 6 via TOPICAL

## 2018-09-18 MED ORDER — METOPROLOL TARTRATE 12.5 MG HALF TABLET
12.5000 mg | ORAL_TABLET | Freq: Once | ORAL | Status: AC
  Administered 2018-09-19: 12.5 mg via ORAL
  Filled 2018-09-18: qty 1

## 2018-09-18 MED ORDER — DIAZEPAM 5 MG PO TABS
5.0000 mg | ORAL_TABLET | Freq: Once | ORAL | Status: AC
  Administered 2018-09-19: 5 mg via ORAL
  Filled 2018-09-18: qty 1

## 2018-09-18 MED ORDER — CHLORHEXIDINE GLUCONATE 0.12 % MT SOLN
15.0000 mL | Freq: Once | OROMUCOSAL | Status: AC
  Administered 2018-09-19: 15 mL via OROMUCOSAL
  Filled 2018-09-18: qty 15

## 2018-09-18 MED ORDER — TEMAZEPAM 15 MG PO CAPS: 15.0000 mg | ORAL_CAPSULE | Freq: Once | ORAL | Status: DC | PRN

## 2018-09-18 MED ORDER — BISACODYL 5 MG PO TBEC
5.0000 mg | DELAYED_RELEASE_TABLET | Freq: Once | ORAL | Status: AC
  Administered 2018-09-18: 5 mg via ORAL
  Filled 2018-09-18: qty 1

## 2018-09-18 MED ORDER — CHLORHEXIDINE GLUCONATE CLOTH 2 % EX PADS
6.0000 | MEDICATED_PAD | Freq: Once | CUTANEOUS | Status: AC
  Administered 2018-09-19: 06:00:00 6 via TOPICAL

## 2018-09-18 NOTE — Progress Notes (Signed)
Procedure(s) (LRB): BENTALL PROCEDURE OR VALVE SPARING ROOT REPLACEMENT (N/A) TRANSESOPHAGEAL ECHOCARDIOGRAM (TEE) (N/A) Subjective: No complaints, slept well  Objective: Vital signs in last 24 hours: Temp:  [97.8 F (36.6 C)-98.4 F (36.9 C)] 97.8 F (36.6 C) (04/26 0733) Pulse Rate:  [110] 110 (04/26 0833) Cardiac Rhythm: Normal sinus rhythm;Sinus tachycardia (04/26 0400) Resp:  [15-33] 20 (04/26 0800) BP: (103-146)/(90-115) 141/110 (04/26 0833) SpO2:  [92 %-99 %] 94 % (04/26 0800) Weight:  [82.1 kg] 82.1 kg (04/26 0500)  Hemodynamic parameters for last 24 hours:    Intake/Output from previous day: 04/25 0701 - 04/26 0700 In: 840 [P.O.:840] Out: 601 [Urine:600; Stool:1] Intake/Output this shift: No intake/output data recorded.  General appearance: alert, cooperative and no distress Neurologic: intact Heart: tachy, regular Lungs: clear to auscultation bilaterally  Lab Results: Recent Labs    09/16/18 1526 09/17/18 0224  WBC 10.1 10.6*  HGB 14.7 15.2  HCT 42.0 43.4  PLT 214 202   BMET:  Recent Labs    09/16/18 1526 09/17/18 0224  NA 137 136  K 3.2* 3.8  CL 104 100  CO2 21* 23  GLUCOSE 103* 94  BUN 8 8  CREATININE 0.57* 0.74  CALCIUM 9.2 9.2    PT/INR:  Recent Labs    09/16/18 1526  LABPROT 13.3  INR 1.0   ABG No results found for: PHART, HCO3, TCO2, ACIDBASEDEF, O2SAT CBG (last 3)  No results for input(s): GLUCAP in the last 72 hours.  Assessment/Plan: S/P Procedure(s) (LRB): BENTALL PROCEDURE OR VALVE SPARING ROOT REPLACEMENT (N/A) TRANSESOPHAGEAL ECHOCARDIOGRAM (TEE) (N/A) -Ascending aneurysm with focal dissection COVID test negative CXR left upper lobe opacity less apparent on today's film No fevers For repair ascending aneurysm tomorrow AM   LOS: 2 days    Melrose Nakayama 09/18/2018

## 2018-09-19 ENCOUNTER — Encounter: Payer: Self-pay | Admitting: Surgery

## 2018-09-19 ENCOUNTER — Inpatient Hospital Stay (HOSPITAL_COMMUNITY)
Admission: AD | Disposition: A | Discharge status: Home / Self Care | Payer: Self-pay | Source: Ambulatory Visit | Attending: Surgery

## 2018-09-19 ENCOUNTER — Inpatient Hospital Stay (HOSPITAL_COMMUNITY): Payer: BLUE CROSS/BLUE SHIELD | Admitting: Certified Registered Nurse Anesthetist

## 2018-09-19 ENCOUNTER — Inpatient Hospital Stay (HOSPITAL_COMMUNITY): Payer: BLUE CROSS/BLUE SHIELD

## 2018-09-19 DIAGNOSIS — Z9889 Other specified postprocedural states: Secondary | ICD-10-CM

## 2018-09-19 DIAGNOSIS — I711 Thoracic aortic aneurysm, ruptured: Secondary | ICD-10-CM

## 2018-09-19 DIAGNOSIS — Z8679 Personal history of other diseases of the circulatory system: Secondary | ICD-10-CM

## 2018-09-19 HISTORY — PX: TEE WITHOUT CARDIOVERSION: SHX5443

## 2018-09-19 HISTORY — PX: BENTALL PROCEDURE: SHX5058

## 2018-09-19 LAB — POCT I-STAT 4, (NA,K, GLUC, HGB,HCT)
Glucose, Bld: 113 mg/dL — ABNORMAL HIGH (ref 70–99)
Glucose, Bld: 97 mg/dL (ref 70–99)
Glucose, Bld: 90 mg/dL (ref 70–99)
Glucose, Bld: 121 mg/dL — ABNORMAL HIGH (ref 70–99)
Glucose, Bld: 155 mg/dL — ABNORMAL HIGH (ref 70–99)
Glucose, Bld: 171 mg/dL — ABNORMAL HIGH (ref 70–99)
Glucose, Bld: 161 mg/dL — ABNORMAL HIGH (ref 70–99)
HCT: 43 % (ref 39.0–52.0)
HCT: 37 % — ABNORMAL LOW (ref 39.0–52.0)
HCT: 28 % — ABNORMAL LOW (ref 39.0–52.0)
HCT: 26 % — ABNORMAL LOW (ref 39.0–52.0)
HCT: 28 % — ABNORMAL LOW (ref 39.0–52.0)
HCT: 29 % — ABNORMAL LOW (ref 39.0–52.0)
HCT: 28 % — ABNORMAL LOW (ref 39.0–52.0)
Hemoglobin: 14.6 g/dL (ref 13.0–17.0)
Hemoglobin: 12.6 g/dL — ABNORMAL LOW (ref 13.0–17.0)
Hemoglobin: 9.5 g/dL — ABNORMAL LOW (ref 13.0–17.0)
Hemoglobin: 8.8 g/dL — ABNORMAL LOW (ref 13.0–17.0)
Hemoglobin: 9.5 g/dL — ABNORMAL LOW (ref 13.0–17.0)
Hemoglobin: 9.9 g/dL — ABNORMAL LOW (ref 13.0–17.0)
Hemoglobin: 9.5 g/dL — ABNORMAL LOW (ref 13.0–17.0)
Potassium: 3.9 mmol/L (ref 3.5–5.1)
Potassium: 3.7 mmol/L (ref 3.5–5.1)
Potassium: 4 mmol/L (ref 3.5–5.1)
Potassium: 5.6 mmol/L — ABNORMAL HIGH (ref 3.5–5.1)
Potassium: 4.9 mmol/L (ref 3.5–5.1)
Potassium: 6.3 mmol/L (ref 3.5–5.1)
Potassium: 4.6 mmol/L (ref 3.5–5.1)
Sodium: 138 mmol/L (ref 135–145)
Sodium: 138 mmol/L (ref 135–145)
Sodium: 141 mmol/L (ref 135–145)
Sodium: 135 mmol/L (ref 135–145)
Sodium: 135 mmol/L (ref 135–145)
Sodium: 136 mmol/L (ref 135–145)
Sodium: 138 mmol/L (ref 135–145)

## 2018-09-19 LAB — POCT I-STAT 7, (LYTES, BLD GAS, ICA,H+H)
Acid-Base Excess: 2 mmol/L (ref 0.0–2.0)
Acid-base deficit: 1 mmol/L (ref 0.0–2.0)
Acid-base deficit: 4 mmol/L — ABNORMAL HIGH (ref 0.0–2.0)
Acid-base deficit: 6 mmol/L — ABNORMAL HIGH (ref 0.0–2.0)
Acid-base deficit: 4 mmol/L — ABNORMAL HIGH (ref 0.0–2.0)
Bicarbonate: 25.9 mmol/L (ref 20.0–28.0)
Bicarbonate: 26.2 mmol/L (ref 20.0–28.0)
Bicarbonate: 22.3 mmol/L (ref 20.0–28.0)
Bicarbonate: 18.7 mmol/L — ABNORMAL LOW (ref 20.0–28.0)
Bicarbonate: 20.9 mmol/L (ref 20.0–28.0)
Calcium, Ion: 0.85 mmol/L — CL (ref 1.15–1.40)
Calcium, Ion: 1.07 mmol/L — ABNORMAL LOW (ref 1.15–1.40)
Calcium, Ion: 1.1 mmol/L — ABNORMAL LOW (ref 1.15–1.40)
Calcium, Ion: 1.11 mmol/L — ABNORMAL LOW (ref 1.15–1.40)
Calcium, Ion: 1.09 mmol/L — ABNORMAL LOW (ref 1.15–1.40)
HCT: 27 % — ABNORMAL LOW (ref 39.0–52.0)
HCT: 28 % — ABNORMAL LOW (ref 39.0–52.0)
HCT: 27 % — ABNORMAL LOW (ref 39.0–52.0)
HCT: 23 % — ABNORMAL LOW (ref 39.0–52.0)
HCT: 25 % — ABNORMAL LOW (ref 39.0–52.0)
Hemoglobin: 9.2 g/dL — ABNORMAL LOW (ref 13.0–17.0)
Hemoglobin: 9.5 g/dL — ABNORMAL LOW (ref 13.0–17.0)
Hemoglobin: 9.2 g/dL — ABNORMAL LOW (ref 13.0–17.0)
Hemoglobin: 7.8 g/dL — ABNORMAL LOW (ref 13.0–17.0)
Hemoglobin: 8.5 g/dL — ABNORMAL LOW (ref 13.0–17.0)
O2 Saturation: 100 %
O2 Saturation: 100 %
O2 Saturation: 92 %
O2 Saturation: 96 %
O2 Saturation: 99 %
Patient temperature: 96.9
Patient temperature: 36.9
Patient temperature: 36.7
Potassium: 4 mmol/L (ref 3.5–5.1)
Potassium: 5.4 mmol/L — ABNORMAL HIGH (ref 3.5–5.1)
Potassium: 4.1 mmol/L (ref 3.5–5.1)
Potassium: 4.8 mmol/L (ref 3.5–5.1)
Potassium: 5.1 mmol/L (ref 3.5–5.1)
Sodium: 140 mmol/L (ref 135–145)
Sodium: 136 mmol/L (ref 135–145)
Sodium: 141 mmol/L (ref 135–145)
Sodium: 139 mmol/L (ref 135–145)
Sodium: 138 mmol/L (ref 135–145)
TCO2: 27 mmol/L (ref 22–32)
TCO2: 28 mmol/L (ref 22–32)
TCO2: 24 mmol/L (ref 22–32)
TCO2: 20 mmol/L — ABNORMAL LOW (ref 22–32)
TCO2: 22 mmol/L (ref 22–32)
pCO2 arterial: 34.9 mmHg (ref 32.0–48.0)
pCO2 arterial: 56.5 mmHg — ABNORMAL HIGH (ref 32.0–48.0)
pCO2 arterial: 42.3 mmHg (ref 32.0–48.0)
pCO2 arterial: 31.2 mmHg — ABNORMAL LOW (ref 32.0–48.0)
pCO2 arterial: 36 mmHg (ref 32.0–48.0)
pH, Arterial: 7.479 — ABNORMAL HIGH (ref 7.350–7.450)
pH, Arterial: 7.273 — ABNORMAL LOW (ref 7.350–7.450)
pH, Arterial: 7.325 — ABNORMAL LOW (ref 7.350–7.450)
pH, Arterial: 7.385 (ref 7.350–7.450)
pH, Arterial: 7.369 (ref 7.350–7.450)
pO2, Arterial: 345 mmHg — ABNORMAL HIGH (ref 83.0–108.0)
pO2, Arterial: 221 mmHg — ABNORMAL HIGH (ref 83.0–108.0)
pO2, Arterial: 65 mmHg — ABNORMAL LOW (ref 83.0–108.0)
pO2, Arterial: 82 mmHg — ABNORMAL LOW (ref 83.0–108.0)
pO2, Arterial: 119 mmHg — ABNORMAL HIGH (ref 83.0–108.0)

## 2018-09-19 LAB — BASIC METABOLIC PANEL
Anion gap: 14 (ref 5–15)
Anion gap: 9 (ref 5–15)
BUN: 9 mg/dL (ref 6–20)
BUN: 8 mg/dL (ref 6–20)
CO2: 26 mmol/L (ref 22–32)
CO2: 20 mmol/L — ABNORMAL LOW (ref 22–32)
Calcium: 9.6 mg/dL (ref 8.9–10.3)
Calcium: 7.9 mg/dL — ABNORMAL LOW (ref 8.9–10.3)
Chloride: 100 mmol/L (ref 98–111)
Chloride: 110 mmol/L (ref 98–111)
Creatinine, Ser: 0.73 mg/dL (ref 0.61–1.24)
Creatinine, Ser: 0.74 mg/dL (ref 0.61–1.24)
GFR calc Af Amer: >60 mL/min (ref >60)
GFR calc Af Amer: >60 mL/min (ref >60)
GFR calc non Af Amer: >60 mL/min (ref >60)
GFR calc non Af Amer: >60 mL/min (ref >60)
Glucose, Bld: 92 mg/dL (ref 70–99)
Glucose, Bld: 137 mg/dL — ABNORMAL HIGH (ref 70–99)
Potassium: 3.5 mmol/L (ref 3.5–5.1)
Potassium: 4.8 mmol/L (ref 3.5–5.1)
Sodium: 140 mmol/L (ref 135–145)
Sodium: 139 mmol/L (ref 135–145)

## 2018-09-19 LAB — CBC
HCT: 45.1 % (ref 39.0–52.0)
HCT: 28.5 % — ABNORMAL LOW (ref 39.0–52.0)
HCT: 28.3 % — ABNORMAL LOW (ref 39.0–52.0)
Hemoglobin: 15.6 g/dL (ref 13.0–17.0)
Hemoglobin: 9.6 g/dL — ABNORMAL LOW (ref 13.0–17.0)
Hemoglobin: 9.7 g/dL — ABNORMAL LOW (ref 13.0–17.0)
MCH: 34.7 pg — ABNORMAL HIGH (ref 26.0–34.0)
MCH: 34.4 pg — ABNORMAL HIGH (ref 26.0–34.0)
MCH: 34.8 pg — ABNORMAL HIGH (ref 26.0–34.0)
MCHC: 34.6 g/dL (ref 30.0–36.0)
MCHC: 33.7 g/dL (ref 30.0–36.0)
MCHC: 34.3 g/dL (ref 30.0–36.0)
MCV: 100.4 fL — ABNORMAL HIGH (ref 80.0–100.0)
MCV: 102.2 fL — ABNORMAL HIGH (ref 80.0–100.0)
MCV: 101.4 fL — ABNORMAL HIGH (ref 80.0–100.0)
Platelets: 236 10*3/uL (ref 150–400)
Platelets: 146 10*3/uL — ABNORMAL LOW (ref 150–400)
Platelets: 168 10*3/uL (ref 150–400)
RBC: 4.49 MIL/uL (ref 4.22–5.81)
RBC: 2.79 MIL/uL — ABNORMAL LOW (ref 4.22–5.81)
RBC: 2.79 MIL/uL — ABNORMAL LOW (ref 4.22–5.81)
RDW: 12.9 % (ref 11.5–15.5)
RDW: 12.8 % (ref 11.5–15.5)
RDW: 12.8 % (ref 11.5–15.5)
WBC: 12 10*3/uL — ABNORMAL HIGH (ref 4.0–10.5)
WBC: 14.5 10*3/uL — ABNORMAL HIGH (ref 4.0–10.5)
WBC: 16.8 10*3/uL — ABNORMAL HIGH (ref 4.0–10.5)
nRBC: 0 % (ref 0.0–0.2)
nRBC: 0 % (ref 0.0–0.2)
nRBC: 0 % (ref 0.0–0.2)

## 2018-09-19 LAB — POCT I-STAT EG7
Acid-base deficit: 1 mmol/L (ref 0.0–2.0)
Bicarbonate: 23.4 mmol/L (ref 20.0–28.0)
Calcium, Ion: 0.99 mmol/L — ABNORMAL LOW (ref 1.15–1.40)
HCT: 27 % — ABNORMAL LOW (ref 39.0–52.0)
Hemoglobin: 9.2 g/dL — ABNORMAL LOW (ref 13.0–17.0)
O2 Saturation: 72 %
Potassium: 6 mmol/L — ABNORMAL HIGH (ref 3.5–5.1)
Sodium: 136 mmol/L (ref 135–145)
TCO2: 25 mmol/L (ref 22–32)
pCO2, Ven: 37.6 mmHg — ABNORMAL LOW (ref 44.0–60.0)
pH, Ven: 7.401 (ref 7.250–7.430)
pO2, Ven: 38 mmHg (ref 32.0–45.0)

## 2018-09-19 LAB — GLUCOSE, CAPILLARY
Glucose-Capillary: 115 mg/dL — ABNORMAL HIGH (ref 70–99)
Glucose-Capillary: 70 mg/dL (ref 70–99)
Glucose-Capillary: 110 mg/dL — ABNORMAL HIGH (ref 70–99)
Glucose-Capillary: 107 mg/dL — ABNORMAL HIGH (ref 70–99)
Glucose-Capillary: 98 mg/dL (ref 70–99)
Glucose-Capillary: 103 mg/dL — ABNORMAL HIGH (ref 70–99)
Glucose-Capillary: 105 mg/dL — ABNORMAL HIGH (ref 70–99)

## 2018-09-19 LAB — MAGNESIUM: Magnesium: 3.5 mg/dL — ABNORMAL HIGH (ref 1.7–2.4)

## 2018-09-19 LAB — ECHO INTRAOPERATIVE TEE
Height: 67 in
Weight: 2868.8 oz

## 2018-09-19 LAB — PROTIME-INR
INR: 1.5 — ABNORMAL HIGH (ref 0.8–1.2)
Prothrombin Time: 17.5 seconds — ABNORMAL HIGH (ref 11.4–15.2)

## 2018-09-19 LAB — HEMOGLOBIN AND HEMATOCRIT, BLOOD
HCT: 30.2 % — ABNORMAL LOW (ref 39.0–52.0)
Hemoglobin: 10.2 g/dL — ABNORMAL LOW (ref 13.0–17.0)

## 2018-09-19 LAB — PREPARE RBC (CROSSMATCH)

## 2018-09-19 LAB — APTT: aPTT: 31 seconds (ref 24–36)

## 2018-09-19 LAB — FIBRINOGEN: Fibrinogen: 369 mg/dL (ref 210–475)

## 2018-09-19 LAB — PLATELET COUNT: Platelets: 190 10*3/uL (ref 150–400)

## 2018-09-19 SURGERY — BENTALL PROCEDURE
Anesthesia: General | Site: Chest

## 2018-09-19 MED ORDER — SODIUM CHLORIDE 0.9% FLUSH: 3.0000 mL | INTRAVENOUS | Status: DC | PRN

## 2018-09-19 MED ORDER — METOPROLOL TARTRATE 5 MG/5ML IV SOLN
2.5000 mg | INTRAVENOUS | Status: DC | PRN
  Administered 2018-09-21: 5 mg via INTRAVENOUS
  Administered 2018-09-21: 2.5 mg via INTRAVENOUS
  Filled 2018-09-19 (×2): qty 5

## 2018-09-19 MED ORDER — INSULIN REGULAR(HUMAN) IN NACL 100-0.9 UT/100ML-% IV SOLN
INTRAVENOUS | Status: AC
  Administered 2018-09-19: 1.1 [IU]/h via INTRAVENOUS
  Filled 2018-09-19 (×2): qty 100

## 2018-09-19 MED ORDER — PROTAMINE SULFATE 10 MG/ML IV SOLN
INTRAVENOUS | Status: DC | PRN
  Administered 2018-09-19: 280 mg via INTRAVENOUS

## 2018-09-19 MED ORDER — SODIUM CHLORIDE 0.9 % IV SOLN
750.0000 mg | INTRAVENOUS | Status: DC
  Filled 2018-09-19: qty 750

## 2018-09-19 MED ORDER — THROMBIN (RECOMBINANT) 20000 UNITS EX SOLR
CUTANEOUS | Status: AC
  Filled 2018-09-19: qty 20000

## 2018-09-19 MED ORDER — FENTANYL CITRATE (PF) 250 MCG/5ML IJ SOLN
INTRAMUSCULAR | Status: AC
  Filled 2018-09-19: qty 25

## 2018-09-19 MED ORDER — SODIUM CHLORIDE 0.9 % IV SOLN
INTRAVENOUS | Status: DC | PRN
  Administered 2018-09-19: 750 mg via INTRAVENOUS

## 2018-09-19 MED ORDER — POTASSIUM CHLORIDE 10 MEQ/50ML IV SOLN: 10.0000 meq | INTRAVENOUS | Status: AC

## 2018-09-19 MED ORDER — MIDAZOLAM HCL 5 MG/5ML IJ SOLN
INTRAMUSCULAR | Status: DC | PRN
  Administered 2018-09-19 (×4): 2 mg via INTRAVENOUS
  Administered 2018-09-19 (×2): 1 mg via INTRAVENOUS

## 2018-09-19 MED ORDER — FENTANYL CITRATE (PF) 250 MCG/5ML IJ SOLN
INTRAMUSCULAR | Status: DC | PRN
  Administered 2018-09-19: 100 ug via INTRAVENOUS
  Administered 2018-09-19: 50 ug via INTRAVENOUS
  Administered 2018-09-19: 100 ug via INTRAVENOUS
  Administered 2018-09-19: 150 ug via INTRAVENOUS
  Administered 2018-09-19 (×2): 100 ug via INTRAVENOUS
  Administered 2018-09-19: 200 ug via INTRAVENOUS
  Administered 2018-09-19: 250 ug via INTRAVENOUS

## 2018-09-19 MED ORDER — ALBUMIN HUMAN 5 % IV SOLN
250.0000 mL | INTRAVENOUS | Status: DC | PRN
  Administered 2018-09-19 (×2): 12.5 g via INTRAVENOUS

## 2018-09-19 MED ORDER — MILRINONE LACTATE IN DEXTROSE 20-5 MG/100ML-% IV SOLN
0.3000 ug/kg/min | INTRAVENOUS | Status: DC
  Filled 2018-09-19: qty 100

## 2018-09-19 MED ORDER — SODIUM CHLORIDE 0.9 % IV SOLN: 250.0000 mL | INTRAVENOUS | Status: DC

## 2018-09-19 MED ORDER — MIDAZOLAM HCL (PF) 10 MG/2ML IJ SOLN
INTRAMUSCULAR | Status: AC
  Filled 2018-09-19: qty 2

## 2018-09-19 MED ORDER — HEPARIN SODIUM (PORCINE) 1000 UNIT/ML IJ SOLN
INTRAMUSCULAR | Status: AC
  Filled 2018-09-19: qty 1

## 2018-09-19 MED ORDER — HEPARIN SODIUM (PORCINE) 1000 UNIT/ML IJ SOLN
INTRAMUSCULAR | Status: DC | PRN
  Administered 2018-09-19: 28000 [IU] via INTRAVENOUS

## 2018-09-19 MED ORDER — LACTATED RINGERS IV SOLN
INTRAVENOUS | Status: DC
  Administered 2018-09-19: via INTRAVENOUS

## 2018-09-19 MED ORDER — PROTAMINE SULFATE 10 MG/ML IV SOLN
INTRAVENOUS | Status: AC
  Filled 2018-09-19: qty 25

## 2018-09-19 MED ORDER — IPRATROPIUM-ALBUTEROL 0.5-2.5 (3) MG/3ML IN SOLN
3.0000 mL | RESPIRATORY_TRACT | Status: DC | PRN
  Administered 2018-09-19: 3 mL via RESPIRATORY_TRACT
  Filled 2018-09-19: qty 3

## 2018-09-19 MED ORDER — INSULIN ASPART 100 UNIT/ML ~~LOC~~ SOLN
0.0000 [IU] | SUBCUTANEOUS | Status: DC
  Administered 2018-09-20: 4 [IU] via SUBCUTANEOUS
  Administered 2018-09-20: 05:00:00 2 [IU] via SUBCUTANEOUS

## 2018-09-19 MED ORDER — PROPOFOL 10 MG/ML IV BOLUS
INTRAVENOUS | Status: DC | PRN
  Administered 2018-09-19: 80 mg via INTRAVENOUS

## 2018-09-19 MED ORDER — PHENYLEPHRINE HCL-NACL 20-0.9 MG/250ML-% IV SOLN
30.0000 ug/min | INTRAVENOUS | Status: AC
  Administered 2018-09-19: 10 ug/min via INTRAVENOUS
  Filled 2018-09-19: qty 250

## 2018-09-19 MED ORDER — EPHEDRINE 5 MG/ML INJ
INTRAVENOUS | Status: AC
  Filled 2018-09-19: qty 10

## 2018-09-19 MED ORDER — METOPROLOL TARTRATE 12.5 MG HALF TABLET
12.5000 mg | ORAL_TABLET | Freq: Two times a day (BID) | ORAL | Status: DC
  Administered 2018-09-20 (×2): 12.5 mg via ORAL
  Filled 2018-09-19 (×2): qty 1

## 2018-09-19 MED ORDER — ROCURONIUM BROMIDE 50 MG/5ML IV SOSY
PREFILLED_SYRINGE | INTRAVENOUS | Status: AC
  Filled 2018-09-19: qty 25

## 2018-09-19 MED ORDER — ETOMIDATE 2 MG/ML IV SOLN
INTRAVENOUS | Status: DC | PRN
  Administered 2018-09-19: 14 mg via INTRAVENOUS

## 2018-09-19 MED ORDER — SUCCINYLCHOLINE CHLORIDE 20 MG/ML IJ SOLN
INTRAMUSCULAR | Status: DC | PRN
  Administered 2018-09-19: 150 mg via INTRAVENOUS

## 2018-09-19 MED ORDER — ONDANSETRON HCL 4 MG/2ML IJ SOLN
4.0000 mg | Freq: Four times a day (QID) | INTRAMUSCULAR | Status: DC | PRN
  Administered 2018-09-20 – 2018-09-21 (×2): 4 mg via INTRAVENOUS
  Filled 2018-09-19 (×2): qty 2

## 2018-09-19 MED ORDER — ALBUMIN HUMAN 5 % IV SOLN
INTRAVENOUS | Status: DC | PRN
  Administered 2018-09-19: 15:00:00 via INTRAVENOUS

## 2018-09-19 MED ORDER — HEMOSTATIC AGENTS (NO CHARGE) OPTIME
TOPICAL | Status: DC | PRN
  Administered 2018-09-19: 1 via TOPICAL

## 2018-09-19 MED ORDER — CHLORHEXIDINE GLUCONATE CLOTH 2 % EX PADS
6.0000 | MEDICATED_PAD | Freq: Every day | CUTANEOUS | Status: DC
  Administered 2018-09-19 – 2018-09-22 (×4): 6 via TOPICAL

## 2018-09-19 MED ORDER — OXYCODONE HCL 5 MG PO TABS
5.0000 mg | ORAL_TABLET | ORAL | Status: DC | PRN
  Administered 2018-09-20 (×4): 10 mg via ORAL
  Administered 2018-09-20: 5 mg via ORAL
  Administered 2018-09-21 (×3): 10 mg via ORAL
  Administered 2018-09-21: 5 mg via ORAL
  Administered 2018-09-22: 10 mg via ORAL
  Administered 2018-09-22: 5 mg via ORAL
  Administered 2018-09-22 (×2): 10 mg via ORAL
  Filled 2018-09-19 (×2): qty 2
  Filled 2018-09-19: qty 1
  Filled 2018-09-19 (×3): qty 2
  Filled 2018-09-19: qty 1
  Filled 2018-09-19 (×5): qty 2

## 2018-09-19 MED ORDER — SODIUM CHLORIDE 0.9% FLUSH: 10.0000 mL | INTRAVENOUS | Status: DC | PRN

## 2018-09-19 MED ORDER — BISACODYL 10 MG RE SUPP: 10.0000 mg | Freq: Every day | RECTAL | Status: DC

## 2018-09-19 MED ORDER — ACETAMINOPHEN 160 MG/5ML PO SOLN
650.0000 mg | Freq: Once | ORAL | Status: AC
  Administered 2018-09-19: 650 mg
  Filled 2018-09-19: qty 20.3

## 2018-09-19 MED ORDER — ORAL CARE MOUTH RINSE: 15.0000 mL | OROMUCOSAL | Status: DC

## 2018-09-19 MED ORDER — TRANEXAMIC ACID (OHS) BOLUS VIA INFUSION
15.0000 mg/kg | INTRAVENOUS | Status: AC
  Administered 2018-09-19: 1219.5 mg via INTRAVENOUS
  Filled 2018-09-19: qty 1220

## 2018-09-19 MED ORDER — ASPIRIN EC 325 MG PO TBEC
325.0000 mg | DELAYED_RELEASE_TABLET | Freq: Every day | ORAL | Status: DC
  Administered 2018-09-20: 325 mg via ORAL
  Filled 2018-09-19: qty 1

## 2018-09-19 MED ORDER — BISACODYL 5 MG PO TBEC
10.0000 mg | DELAYED_RELEASE_TABLET | Freq: Every day | ORAL | Status: DC
  Administered 2018-09-20 – 2018-09-22 (×3): 10 mg via ORAL
  Filled 2018-09-19 (×3): qty 2

## 2018-09-19 MED ORDER — NOREPINEPHRINE 4 MG/250ML-% IV SOLN
0.0000 ug/min | INTRAVENOUS | Status: DC
  Filled 2018-09-19: qty 250

## 2018-09-19 MED ORDER — ACETAMINOPHEN 160 MG/5ML PO SOLN: 1000.0000 mg | Freq: Four times a day (QID) | ORAL | Status: DC

## 2018-09-19 MED ORDER — DOCUSATE SODIUM 100 MG PO CAPS
200.0000 mg | ORAL_CAPSULE | Freq: Every day | ORAL | Status: DC
  Administered 2018-09-20 – 2018-09-22 (×3): 200 mg via ORAL
  Filled 2018-09-19 (×3): qty 2

## 2018-09-19 MED ORDER — TRAMADOL HCL 50 MG PO TABS
50.0000 mg | ORAL_TABLET | ORAL | Status: DC | PRN
  Administered 2018-09-21 – 2018-09-22 (×2): 100 mg via ORAL
  Filled 2018-09-19 (×2): qty 2
  Filled 2018-09-19: qty 1

## 2018-09-19 MED ORDER — VANCOMYCIN HCL IN DEXTROSE 1-5 GM/200ML-% IV SOLN
1000.0000 mg | Freq: Once | INTRAVENOUS | Status: AC
  Administered 2018-09-19: 1000 mg via INTRAVENOUS
  Filled 2018-09-19: qty 200

## 2018-09-19 MED ORDER — ACETAMINOPHEN 500 MG PO TABS
1000.0000 mg | ORAL_TABLET | Freq: Four times a day (QID) | ORAL | Status: DC
  Administered 2018-09-20 – 2018-09-22 (×11): 1000 mg via ORAL
  Filled 2018-09-19 (×11): qty 2

## 2018-09-19 MED ORDER — MIDAZOLAM HCL 2 MG/2ML IJ SOLN: 2.0000 mg | INTRAMUSCULAR | Status: DC | PRN

## 2018-09-19 MED ORDER — PLASMA-LYTE 148 IV SOLN
INTRAVENOUS | Status: DC
  Filled 2018-09-19: qty 2.5

## 2018-09-19 MED ORDER — SODIUM CHLORIDE 0.9 % IV SOLN
1.5000 g | Freq: Two times a day (BID) | INTRAVENOUS | Status: AC
  Administered 2018-09-19 – 2018-09-21 (×4): 1.5 g via INTRAVENOUS
  Filled 2018-09-19 (×4): qty 1.5

## 2018-09-19 MED ORDER — DEXMEDETOMIDINE HCL IN NACL 200 MCG/50ML IV SOLN
0.0000 ug/kg/h | INTRAVENOUS | Status: DC
  Administered 2018-09-19: 0.1 ug/kg/h via INTRAVENOUS
  Filled 2018-09-19: qty 50

## 2018-09-19 MED ORDER — PROPOFOL 10 MG/ML IV BOLUS
INTRAVENOUS | Status: AC
  Filled 2018-09-19: qty 20

## 2018-09-19 MED ORDER — 0.9 % SODIUM CHLORIDE (POUR BTL) OPTIME
TOPICAL | Status: DC | PRN
  Administered 2018-09-19: 6000 mL

## 2018-09-19 MED ORDER — TRANEXAMIC ACID (OHS) PUMP PRIME SOLUTION
2.0000 mg/kg | INTRAVENOUS | Status: DC
  Filled 2018-09-19: qty 1.63

## 2018-09-19 MED ORDER — CHLORHEXIDINE GLUCONATE 0.12% ORAL RINSE (MEDLINE KIT)
15.0000 mL | Freq: Two times a day (BID) | OROMUCOSAL | Status: DC
  Administered 2018-09-19: 15 mL via OROMUCOSAL

## 2018-09-19 MED ORDER — PHENYLEPHRINE 40 MCG/ML (10ML) SYRINGE FOR IV PUSH (FOR BLOOD PRESSURE SUPPORT)
PREFILLED_SYRINGE | INTRAVENOUS | Status: AC
  Filled 2018-09-19: qty 10

## 2018-09-19 MED ORDER — INSULIN REGULAR(HUMAN) IN NACL 100-0.9 UT/100ML-% IV SOLN: INTRAVENOUS | Status: DC

## 2018-09-19 MED ORDER — ACETAMINOPHEN 650 MG RE SUPP: 650.0000 mg | Freq: Once | RECTAL | Status: AC

## 2018-09-19 MED ORDER — NITROGLYCERIN IN D5W 200-5 MCG/ML-% IV SOLN: 0.0000 ug/min | INTRAVENOUS | Status: DC

## 2018-09-19 MED ORDER — METOPROLOL TARTRATE 25 MG/10 ML ORAL SUSPENSION: 12.5000 mg | Freq: Two times a day (BID) | ORAL | Status: DC

## 2018-09-19 MED ORDER — PHENYLEPHRINE HCL-NACL 20-0.9 MG/250ML-% IV SOLN
0.0000 ug/min | INTRAVENOUS | Status: DC
  Administered 2018-09-19: 55 ug/min via INTRAVENOUS
  Filled 2018-09-19 (×2): qty 250

## 2018-09-19 MED ORDER — METHYLPREDNISOLONE SODIUM SUCC 125 MG IJ SOLR
INTRAMUSCULAR | Status: DC | PRN
  Administered 2018-09-19: 125 mg via INTRAVENOUS

## 2018-09-19 MED ORDER — LACTATED RINGERS IV SOLN
INTRAVENOUS | Status: DC | PRN
  Administered 2018-09-19: 07:00:00 via INTRAVENOUS

## 2018-09-19 MED ORDER — SODIUM CHLORIDE 0.9% FLUSH
10.0000 mL | Freq: Two times a day (BID) | INTRAVENOUS | Status: DC
  Administered 2018-09-19 – 2018-09-21 (×5): 10 mL
  Administered 2018-09-22: 20 mL
  Administered 2018-09-23: 10 mL

## 2018-09-19 MED ORDER — INSULIN REGULAR BOLUS VIA INFUSION
0.0000 [IU] | Freq: Three times a day (TID) | INTRAVENOUS | Status: DC
  Filled 2018-09-19: qty 10

## 2018-09-19 MED ORDER — ROCURONIUM BROMIDE 10 MG/ML (PF) SYRINGE
PREFILLED_SYRINGE | INTRAVENOUS | Status: DC | PRN
  Administered 2018-09-19 (×5): 50 mg via INTRAVENOUS

## 2018-09-19 MED ORDER — MORPHINE SULFATE (PF) 2 MG/ML IV SOLN
1.0000 mg | INTRAVENOUS | Status: DC | PRN
  Administered 2018-09-19: 1 mg via INTRAVENOUS
  Administered 2018-09-20: 4 mg via INTRAVENOUS
  Administered 2018-09-20 (×7): 2 mg via INTRAVENOUS
  Administered 2018-09-21: 4 mg via INTRAVENOUS
  Administered 2018-09-21 (×6): 2 mg via INTRAVENOUS
  Administered 2018-09-21: 4 mg via INTRAVENOUS
  Administered 2018-09-22: 2 mg via INTRAVENOUS
  Administered 2018-09-22 (×3): 4 mg via INTRAVENOUS
  Filled 2018-09-19 (×2): qty 2
  Filled 2018-09-19: qty 1
  Filled 2018-09-19: qty 2
  Filled 2018-09-19 (×5): qty 1
  Filled 2018-09-19: qty 2
  Filled 2018-09-19 (×4): qty 1
  Filled 2018-09-19: qty 2
  Filled 2018-09-19 (×5): qty 1
  Filled 2018-09-19: qty 2

## 2018-09-19 MED ORDER — THROMBIN 20000 UNITS EX SOLR
CUTANEOUS | Status: DC | PRN
  Administered 2018-09-19: 20000 [IU] via TOPICAL

## 2018-09-19 MED ORDER — VANCOMYCIN HCL 10 G IV SOLR
1250.0000 mg | INTRAVENOUS | Status: AC
  Administered 2018-09-19: 1250 mg via INTRAVENOUS
  Filled 2018-09-19: qty 1250

## 2018-09-19 MED ORDER — DOPAMINE-DEXTROSE 3.2-5 MG/ML-% IV SOLN
0.0000 ug/kg/min | INTRAVENOUS | Status: DC
  Filled 2018-09-19: qty 250

## 2018-09-19 MED ORDER — SODIUM CHLORIDE 0.9 % IV SOLN
INTRAVENOUS | Status: DC | PRN
  Administered 2018-09-19: 50 ug/min via INTRAVENOUS

## 2018-09-19 MED ORDER — FAMOTIDINE IN NACL 20-0.9 MG/50ML-% IV SOLN
20.0000 mg | Freq: Two times a day (BID) | INTRAVENOUS | Status: DC
  Administered 2018-09-19: 20 mg via INTRAVENOUS
  Filled 2018-09-19 (×2): qty 50

## 2018-09-19 MED ORDER — ASPIRIN 81 MG PO CHEW: 324.0000 mg | CHEWABLE_TABLET | Freq: Every day | ORAL | Status: DC

## 2018-09-19 MED ORDER — SODIUM CHLORIDE 0.9 % IV SOLN
INTRAVENOUS | Status: DC
  Filled 2018-09-19: qty 30

## 2018-09-19 MED ORDER — THROMBIN 20000 UNITS EX SOLR
CUTANEOUS | Status: DC | PRN
  Administered 2018-09-19 (×3): via TOPICAL

## 2018-09-19 MED ORDER — DEXMEDETOMIDINE HCL IN NACL 400 MCG/100ML IV SOLN
0.1000 ug/kg/h | INTRAVENOUS | Status: AC
  Administered 2018-09-19: 0.7 ug/kg/h via INTRAVENOUS
  Filled 2018-09-19: qty 100

## 2018-09-19 MED ORDER — TRANEXAMIC ACID 1000 MG/10ML IV SOLN
1.5000 mg/kg/h | INTRAVENOUS | Status: AC
  Administered 2018-09-19: 1.5 mg/kg/h via INTRAVENOUS
  Filled 2018-09-19: qty 25

## 2018-09-19 MED ORDER — SODIUM CHLORIDE 0.9% FLUSH
3.0000 mL | Freq: Two times a day (BID) | INTRAVENOUS | Status: DC
  Administered 2018-09-20 – 2018-09-21 (×4): 3 mL via INTRAVENOUS

## 2018-09-19 MED ORDER — NITROGLYCERIN IN D5W 200-5 MCG/ML-% IV SOLN
2.0000 ug/min | INTRAVENOUS | Status: DC
  Filled 2018-09-19: qty 250

## 2018-09-19 MED ORDER — LACTATED RINGERS IV SOLN: 500.0000 mL | Freq: Once | INTRAVENOUS | Status: DC | PRN

## 2018-09-19 MED ORDER — LACTATED RINGERS IV SOLN: INTRAVENOUS | Status: DC

## 2018-09-19 MED ORDER — MAGNESIUM SULFATE 4 GM/100ML IV SOLN
4.0000 g | Freq: Once | INTRAVENOUS | Status: AC
  Administered 2018-09-19: 4 g via INTRAVENOUS
  Filled 2018-09-19: qty 100

## 2018-09-19 MED ORDER — SODIUM CHLORIDE 0.45 % IV SOLN: INTRAVENOUS | Status: DC | PRN

## 2018-09-19 MED ORDER — POTASSIUM CHLORIDE 2 MEQ/ML IV SOLN
80.0000 meq | INTRAVENOUS | Status: DC
  Filled 2018-09-19: qty 40

## 2018-09-19 MED ORDER — SODIUM CHLORIDE 0.9 % IV SOLN
1.5000 g | INTRAVENOUS | Status: AC
  Administered 2018-09-19: 1.5 g via INTRAVENOUS
  Filled 2018-09-19: qty 1.5

## 2018-09-19 MED ORDER — MAGNESIUM SULFATE 50 % IJ SOLN
40.0000 meq | INTRAMUSCULAR | Status: DC
  Filled 2018-09-19: qty 9.85

## 2018-09-19 MED ORDER — EPINEPHRINE PF 1 MG/ML IJ SOLN
0.0000 ug/min | INTRAVENOUS | Status: DC
  Filled 2018-09-19: qty 4

## 2018-09-19 MED ORDER — LACTATED RINGERS IV SOLN
INTRAVENOUS | Status: DC | PRN
  Administered 2018-09-19 (×2): via INTRAVENOUS

## 2018-09-19 MED ORDER — ROCURONIUM BROMIDE 100 MG/10ML IV SOLN
INTRAVENOUS | Status: DC | PRN
  Administered 2018-09-19: 80 mg via INTRAVENOUS

## 2018-09-19 MED ORDER — CHLORHEXIDINE GLUCONATE 0.12 % MT SOLN
15.0000 mL | OROMUCOSAL | Status: AC
  Administered 2018-09-19: 15 mL via OROMUCOSAL

## 2018-09-19 MED ORDER — SODIUM CHLORIDE 0.9 % IV SOLN
INTRAVENOUS | Status: DC
  Administered 2018-09-20: 17:00:00 via INTRAVENOUS

## 2018-09-19 MED ORDER — PANTOPRAZOLE SODIUM 40 MG PO TBEC
40.0000 mg | DELAYED_RELEASE_TABLET | Freq: Every day | ORAL | Status: DC
  Administered 2018-09-21 – 2018-09-22 (×2): 40 mg via ORAL
  Filled 2018-09-19 (×2): qty 1

## 2018-09-19 MED ORDER — PROTAMINE SULFATE 10 MG/ML IV SOLN
INTRAVENOUS | Status: AC
  Filled 2018-09-19: qty 5

## 2018-09-19 MED ORDER — PHENYLEPHRINE 40 MCG/ML (10ML) SYRINGE FOR IV PUSH (FOR BLOOD PRESSURE SUPPORT)
PREFILLED_SYRINGE | INTRAVENOUS | Status: AC
  Filled 2018-09-19: qty 30

## 2018-09-19 SURGICAL SUPPLY — 102 items
ADAPTER CARDIO PERF ANTE/RETRO (ADAPTER) ×3 IMPLANT
ADAPTER DLP PERFUSION .25INX2I (MISCELLANEOUS) ×3 IMPLANT
ADPR CRDPLG .25X.64 STRL (MISCELLANEOUS) ×2
ADPR PRFSN 84XANTGRD RTRGD (ADAPTER) ×2
APL SRG 7X2 LUM MLBL SLNT (VASCULAR PRODUCTS) ×6
APL SWBSTK 6 STRL LF DISP (MISCELLANEOUS) ×2
APPLICATOR COTTON TIP 6 STRL (MISCELLANEOUS) IMPLANT
APPLICATOR COTTON TIP 6IN STRL (MISCELLANEOUS) ×3
APPLICATOR TIP COSEAL (VASCULAR PRODUCTS) ×3 IMPLANT
ATTRACTOMAT 16X20 MAGNETIC DRP (DRAPES) ×2 IMPLANT
BAG DECANTER FOR FLEXI CONT (MISCELLANEOUS) IMPLANT
BLADE STERNUM SYSTEM 6 (BLADE) ×3 IMPLANT
BLADE SURG 15 STRL LF DISP TIS (BLADE) ×2 IMPLANT
BLADE SURG 15 STRL SS (BLADE) ×3
CANISTER SUCT 3000ML PPV (MISCELLANEOUS) ×3 IMPLANT
CANNULA GUNDRY RCSP 15FR (MISCELLANEOUS) ×3 IMPLANT
CANNULA SUMP PERICARDIAL (CANNULA) ×1 IMPLANT
CATH HEART VENT LEFT (CATHETERS) ×2 IMPLANT
CATH ROBINSON RED A/P 18FR (CATHETERS) ×9 IMPLANT
CATH THORACIC 36FR (CATHETERS) ×3 IMPLANT
CATH THORACIC 36FR RT ANG (CATHETERS) ×3 IMPLANT
CAUTERY HIGH TEMP VAS (MISCELLANEOUS) ×3 IMPLANT
CLIP VESOCCLUDE MED 24/CT (CLIP) ×1 IMPLANT
CLIP VESOCCLUDE SM WIDE 24/CT (CLIP) ×1 IMPLANT
CONT SPEC 4OZ CLIKSEAL STRL BL (MISCELLANEOUS) ×6 IMPLANT
COVER SURGICAL LIGHT HANDLE (MISCELLANEOUS) ×4 IMPLANT
COVER WAND RF STERILE (DRAPES) ×2 IMPLANT
CRADLE DONUT ADULT HEAD (MISCELLANEOUS) ×3 IMPLANT
DRAPE SLUSH/WARMER DISC (DRAPES) ×2 IMPLANT
DRAPE SURG 17X23 STRL (DRAPES) ×2 IMPLANT
DRSG COVADERM 4X14 (GAUZE/BANDAGES/DRESSINGS) ×3 IMPLANT
ELECT CAUTERY BLADE 6.4 (BLADE) ×3 IMPLANT
ELECT REM PT RETURN 9FT ADLT (ELECTROSURGICAL) ×6
ELECTRODE REM PT RTRN 9FT ADLT (ELECTROSURGICAL) ×4 IMPLANT
FELT TEFLON 1X6 (MISCELLANEOUS) ×4 IMPLANT
GAUZE SPONGE 4X4 12PLY STRL (GAUZE/BANDAGES/DRESSINGS) ×3 IMPLANT
GAUZE SPONGE 4X4 12PLY STRL LF (GAUZE/BANDAGES/DRESSINGS) ×1 IMPLANT
GLOVE BIO SURGEON STRL SZ 6 (GLOVE) ×2 IMPLANT
GLOVE BIO SURGEON STRL SZ 6.5 (GLOVE) ×6 IMPLANT
GLOVE BIO SURGEON STRL SZ7 (GLOVE) IMPLANT
GLOVE BIO SURGEON STRL SZ7.5 (GLOVE) ×1 IMPLANT
GLOVE EUDERMIC 7 POWDERFREE (GLOVE) ×6 IMPLANT
GOWN STRL REUS W/ TWL LRG LVL3 (GOWN DISPOSABLE) ×8 IMPLANT
GOWN STRL REUS W/ TWL XL LVL3 (GOWN DISPOSABLE) ×2 IMPLANT
GOWN STRL REUS W/TWL LRG LVL3 (GOWN DISPOSABLE) ×15
GOWN STRL REUS W/TWL XL LVL3 (GOWN DISPOSABLE) ×3
GRAFT CV 30X8WVN NDL (Graft) IMPLANT
GRAFT HEMASHIELD 8MM (Graft) ×3 IMPLANT
GRAFT WOVEN D/V 26DX30L (Vascular Products) ×1 IMPLANT
HEMOSTAT POWDER SURGIFOAM 1G (HEMOSTASIS) ×9 IMPLANT
HEMOSTAT SURGICEL 2X14 (HEMOSTASIS) ×3 IMPLANT
KIT BASIN OR (CUSTOM PROCEDURE TRAY) ×3 IMPLANT
KIT CATH CPB BARTLE (MISCELLANEOUS) ×3 IMPLANT
KIT SUCTION CATH 14FR (SUCTIONS) ×3 IMPLANT
KIT TURNOVER KIT B (KITS) ×3 IMPLANT
LINE VENT (MISCELLANEOUS) ×1 IMPLANT
LOOP VESSEL SUPERMAXI WHITE (MISCELLANEOUS) ×2 IMPLANT
NS IRRIG 1000ML POUR BTL (IV SOLUTION) ×16 IMPLANT
PACK E OPEN HEART (SUTURE) ×3 IMPLANT
PACK OPEN HEART (CUSTOM PROCEDURE TRAY) ×3 IMPLANT
PAD ARMBOARD 7.5X6 YLW CONV (MISCELLANEOUS) ×6 IMPLANT
SEALANT SURG COSEAL 8ML (VASCULAR PRODUCTS) ×1 IMPLANT
SET CARDIOPLEGIA MPS 5001102 (MISCELLANEOUS) ×2 IMPLANT
SET VEIN GRAFT PERF (SET/KITS/TRAYS/PACK) ×2 IMPLANT
SPONGE LAP 4X18 RFD (DISPOSABLE) ×2 IMPLANT
SUT BONE WAX W31G (SUTURE) ×3 IMPLANT
SUT ETHIBON 2 0 V 52N 30 (SUTURE) ×6 IMPLANT
SUT ETHIBON EXCEL 2-0 V-5 (SUTURE) ×2 IMPLANT
SUT ETHIBOND V-5 VALVE (SUTURE) IMPLANT
SUT PROLENE 3 0 SH 1 (SUTURE) ×3 IMPLANT
SUT PROLENE 3 0 SH 48 (SUTURE) ×6 IMPLANT
SUT PROLENE 3 0 SH DA (SUTURE) ×2 IMPLANT
SUT PROLENE 3 0 SH1 36 (SUTURE) ×1 IMPLANT
SUT PROLENE 4 0 RB 1 (SUTURE) ×9
SUT PROLENE 4-0 RB1 .5 CRCL 36 (SUTURE) ×8 IMPLANT
SUT PROLENE 5 0 C 1 36 (SUTURE) ×3 IMPLANT
SUT PROLENE 5 0 RB 2 (SUTURE) ×4 IMPLANT
SUT SILK 2 0 SH CR/8 (SUTURE) ×1 IMPLANT
SUT SILK 3 0 (SUTURE) ×3
SUT SILK 3-0 18XBRD TIE 12 (SUTURE) IMPLANT
SUT STEEL 6MS V (SUTURE) IMPLANT
SUT STEEL STERNAL CCS#1 18IN (SUTURE) IMPLANT
SUT STEEL SZ 6 DBL 3X14 BALL (SUTURE) ×3 IMPLANT
SUT VIC AB 1 CTX 36 (SUTURE) ×12
SUT VIC AB 1 CTX36XBRD ANBCTR (SUTURE) ×4 IMPLANT
SUT VIC AB 2-0 CT1 27 (SUTURE) ×3
SUT VIC AB 2-0 CT1 TAPERPNT 27 (SUTURE) IMPLANT
SUT VIC AB 3-0 SH 27 (SUTURE) ×6
SUT VIC AB 3-0 SH 27X BRD (SUTURE) ×4 IMPLANT
SUT VIC AB 3-0 X1 27 (SUTURE) ×1 IMPLANT
SYSTEM SAHARA CHEST DRAIN ATS (WOUND CARE) ×3 IMPLANT
TAPE CLOTH SURG 4X10 WHT LF (GAUZE/BANDAGES/DRESSINGS) ×2 IMPLANT
TAPE PAPER 2X10 WHT MICROPORE (GAUZE/BANDAGES/DRESSINGS) ×2 IMPLANT
TOWEL GREEN STERILE (TOWEL DISPOSABLE) ×3 IMPLANT
TOWEL GREEN STERILE FF (TOWEL DISPOSABLE) ×3 IMPLANT
TRAY FOLEY SLVR 14FR TEMP STAT (SET/KITS/TRAYS/PACK) ×2 IMPLANT
TRAY FOLEY SLVR 16FR TEMP STAT (SET/KITS/TRAYS/PACK) ×3 IMPLANT
UNDERPAD 30X30 (UNDERPADS AND DIAPERS) ×3 IMPLANT
VALVE AOR 12X23MECH LO POR (Prosthesis & Implant Heart) IMPLANT
VALVE AORTIC COND (Prosthesis & Implant Heart) ×3 IMPLANT
VENT LEFT HEART 12002 (CATHETERS) ×3
WATER STERILE IRR 1000ML POUR (IV SOLUTION) ×6 IMPLANT

## 2018-09-19 NOTE — Transfer of Care (Signed)
Immediate Anesthesia Transfer of Care Note  Patient: Henry Duncan  Procedure(s) Performed: BENTALL PROCEDURE WITH 23 [place]. (N/A Chest) TRANSESOPHAGEAL ECHOCARDIOGRAM (TEE) (N/A )  Patient Location: ICU  Anesthesia Type:General  Level of Consciousness: sedated and Patient remains intubated per anesthesia plan  Airway & Oxygen Therapy: Patient remains intubated per anesthesia plan and Patient placed on Ventilator (see vital sign flow sheet for setting)  Post-op Assessment: Report given to RN  Post vital signs: Reviewed and stable  - VSS during transport.  On arrival to ICU pt placed on vent 50%fio2 spo2 90%.  fio2 increased to 70%.  Pt coughing and HR 110 SBP 130.  38mcg fentanyl given. BP then dropped to 65/40; dr Linna Caprice at bs.  Neo gtt up to 50; dr Linna Caprice given 233ml albumin.  When anesthesia left at 1555 BP 88/65. Spo2 97% Last Vitals:  Vitals Value Taken Time  BP 61/45 09/19/2018  3:43 PM  Temp    Pulse    Resp 18 09/19/2018  3:56 PM  SpO2 94 % 09/19/2018  3:56 PM  Vitals shown include unvalidated device data.  Last Pain:  Vitals:   09/19/18 0400  TempSrc: Oral  PainSc: 0-No pain      Patients Stated Pain Goal: 0 (86/76/19 5093)  Complications: No apparent anesthesia complications

## 2018-09-19 NOTE — Anesthesia Procedure Notes (Signed)
Arterial Line Insertion Start/End4/27/2020 6:45 AM, 09/19/2018 7:00 AM Performed by: Shirlyn Goltz, CRNA, CRNA  Patient location: Pre-op. Preanesthetic checklist: patient identified, IV checked, site marked, risks and benefits discussed, surgical consent, monitors and equipment checked, pre-op evaluation and anesthesia consent Patient sedated Right, radial was placed Catheter size: 20 G Hand hygiene performed , maximum sterile barriers used  and Seldinger technique used Allen's test indicative of satisfactory collateral circulation Attempts: 1 Procedure performed without using ultrasound guided technique. Following insertion, dressing applied and Biopatch. Post procedure assessment: normal

## 2018-09-19 NOTE — Anesthesia Procedure Notes (Signed)
Procedure Name: Intubation Date/Time: 09/19/2018 7:46 AM Performed by: Shirlyn Goltz, CRNA Pre-anesthesia Checklist: Patient identified, Emergency Drugs available, Suction available and Patient being monitored Patient Re-evaluated:Patient Re-evaluated prior to induction Oxygen Delivery Method: Circle system utilized Preoxygenation: Pre-oxygenation with 100% oxygen Induction Type: Rapid sequence Laryngoscope Size: Mac and 4 Grade View: Grade I Tube type: Oral Tube size: 7.5 mm Number of attempts: 1 Airway Equipment and Method: Stylet Placement Confirmation: ETT inserted through vocal cords under direct vision,  positive ETCO2 and breath sounds checked- equal and bilateral Secured at: 21 cm Tube secured with: Tape Dental Injury: Teeth and Oropharynx as per pre-operative assessment

## 2018-09-19 NOTE — Anesthesia Procedure Notes (Signed)
Central Venous Catheter Insertion Performed by: Roberts Gaudy, MD, anesthesiologist Start/End4/27/2020 7:40 AM, 09/19/2018 8:00 AM Patient location: Pre-op. Preanesthetic checklist: patient identified, IV checked, site marked, risks and benefits discussed, surgical consent, monitors and equipment checked, pre-op evaluation, timeout performed and anesthesia consent Position: Trendelenburg Hand hygiene performed  and maximum sterile barriers used  PA cath was placed.Swan type:thermodilution Procedure performed without using ultrasound guided technique. Attempts: 1 Following insertion, line sutured, dressing applied and Biopatch. Post procedure assessment: blood return through all ports, free fluid flow and no air  Patient tolerated the procedure well with no immediate complications.

## 2018-09-19 NOTE — Op Note (Signed)
CARDIOVASCULAR SURGERY OPERATIVE NOTE  09/19/2018  Surgeon:  Henry Pollack, MD  First Assistant: Henry Pierini,  PA-C   Preoperative Diagnosis:  Bicuspid aortic valve with 5.7 cm ascending aortic aneurysm and acute focal type A aortic dissection   Postoperative Diagnosis:  Same   Procedure:  1. Median Sternotomy 2. Right axillary artery cannulation 3. Extracorporeal circulation via the right axillary artery and right atrium. 3.   Replacement of ascending aortic aneurysm using a 26 mm Hemashield graft ( Hemi-arch) under deep hypothermic circulatory arrest with antegrade cerebral perfusion. 4.   Bentall procedure using a 23 mm St. Jude mechanical valved conduit with reimplantation of both coronary arteries.  Anesthesia:  General Endotracheal   Clinical History/Surgical Indication:  This 28 year old gentleman presented to [facility] emergency room on Tuesday with severe left-sided anterior chest pain that woke him from sleep accompanied by diaphoresis.  He was diagnosed with hypertensive emergency.  He had a CTA of the chest at that time which showed a fusiform aortic root and a sending aortic aneurysm with a maximum diameter in the mid ascending aorta 5.2 x 5.1 cm.  There is no evidence of pulmonary embolism and no aortic dissection.  His blood pressure was treated acutely with resolution of his chest pain.  He was sent home from the emergency room on labetalol 100 mg twice daily.  I saw him in the office on Wednesday and his blood pressure at that time was under good control with a systolic blood pressure in the 120s.  He had no further chest pain.  I reviewed the CTA of the chest and felt that he would require replacement of his aortic root and ascending aorta in the near future given its size and his age.  There is no family history of aortic dissection or aortic aneurysm or connective tissue disorder but there was a strong family history of coronary artery disease.  He was scheduled for a  gated cardiac CTA today to evaluate his coronary arteries and a 2D echocardiogram to evaluate the aortic valve and left ventricular function in anticipation of proceeding with aortic aneurysm repair in the next 1 to 2 weeks.  His gated cardiac CTA today showed a 5.7 to 5.8 cm fusiform ascending aortic aneurysm with a localized focal dissection along the anterior medial ascending aorta just above the sinotubular junction.  Evaluation of his coronary arteries showed heavy calcification in the mid LAD concerning for possible significant stenosis.  There is no significant disease in the left main, left circumflex, or right coronary systems although there was some plaque present.  His coronary artery calcium score was in the 99th percentile for his age and gender.  It was felt that he probably has a bicuspid aortic valve.  FFR in the mid LAD was 0.83 and in the mid D1 FFR was 0.84 indicating no hemodynamically significant stenosis. The patient was sent home from the radiology department and after we were notified we called him and had him admitted to the cardiovascular ICU at [facility] for observation and preparation for surgical repair.  He has had no further chest pain since his initial presenting episode on Tuesday.  His blood pressure remains under good control.  He has a focal aortic dissection and as long as he remained stable without chest pain I think it is reasonable to proceed with surgical repair on Monday morning.  I reviewed the studies with him.  It is not completely clear whether this will be suitable for a  valve sparing aortic root and ascending aortic replacement or will require a Bentall procedure with a mechanical valve conduit.  I discussed these possibilities with him.  He has no absolute contraindication to anticoagulation and if I have to replace his aortic valve given his young age I would recommend a mechanical valve.  He is in agreement with that. I discussed the operative procedure with the  patient including alternatives, benefits and risks; including but not limited to bleeding, blood transfusion, infection, stroke, myocardial infarction, graft failure, heart block requiring a permanent pacemaker, organ dysfunction, and death.  Henry Duncan understands and agrees to proceed.   Preparation:  The patient was seen in the preoperative holding area and the correct patient, correct operation were confirmed with the patient after reviewing the medical record and catheterization. The consent was signed by me. Preoperative antibiotics were given. A pulmonary arterial line and radial arterial line were placed by the anesthesia team. The patient was taken back to the operating room and positioned supine on the operating room table. After being placed under general endotracheal anesthesia by the anesthesia team a foley catheter was placed. The neck, chest, abdomen, and both legs were prepped with betadine soap and solution and draped in the usual sterile manner. A surgical time-out was taken and the correct patient and operative procedure were confirmed with the nursing and anesthesia staff.  TEE: performed by Dr. Roberts Duncan  This showed a bicuspid aortic valve with no aortic stenosis or insufficiency.  There is no significant mitral regurgitation.  Left ventricular systolic function was normal.  There was a focal aortic dissection along the anterior medial aortic wall just above the sinotubular junction.  Cardiopulmonary Bypass:  A median sternotomy was performed. The pericardium was opened in the midline. There was a moderate sized bloody pericardial effusion. Right ventricular function appeared normal. The ascending aorta was aneurysmal and ecchymotic from the dissection.  Venous cannulation was performed via the right atrial appendage using a two-staged venous cannula. A temperature probe was inserted into the interventricular septum and an insulating pad was placed in the pericardium.  CO2 was insufflated into the pericardium throughout the case to minimize intracardiac air.   Right axillary artery exposure and cannulation:  A transverse incision was made below the right clavicle. The pectoralis major muscle was split along its fibers and the pectoralis minor muscle was retracted laterally. The brachial plexus was identified and gently retracted laterally to expose the axillary artery. It was a small vessel but felt to be adequate for CPB. The artery was controlled proximally and distally with vessel loops. The patient was fully heparinized and ACT maintained greater than 400. The axillary artery was clamped proximally and distally with peripheral Debakey clamps. It was opened longitudinally. There was no sign of dissection here. An 8 mm Hemashield dacron graft was anastomosed in an end to side manner using continuous 5-0 prolene suture. CoSeal was applied for hemostasis and the clamp removed. The graft was connected to the arterial end of the bypass circuit.   Resection and grafting of ascending aortic aneurysm:  The patient was placed on cardiopulmonary bypass and a left ventricular vent was placed via the right superior pulmonary vein. Systemic cooling was begun with a goal temperature of 18 degrees centigrade by bladder and rectal temperature probes. A retrograde cardioplegia cannula was placed through the right atrium into the coronary sinus without difficulty. After 20 minutes of cooling the target temperature of 18 degrees centigrade was reached. Cerebral oximetry was  70% bilaterally. BIS was 30. The patient was given Etomidate and 125 mg of Solumedrol. BIS dropped to zero. The head was packed in ice. The bed was placed in steep trendelenburg. Circulatory arrest was begun and the blood volume emptied into the venous reservoir. The innominate artery was clamped and antegrade cerebral perfusion was begun.  Cold blood retrograde cardioplegia was given and myocardial temperature  dropped to 10 degrees centigrade. Additional doses were given at approximately 20 minute intervals throughout the period of circulatory arrest and cross-clamping. Complete diastolic arrest was maintained. The aortic cannula was removed. The aorta was transected just proximal to the innominate artery beveling the resection out along the undersurface of the aortic arch (Hemiarch replacement). The aortic diameter was measured at 26 mm here. A 26 mm Hemasheild Platinum vascular graft was prepared. ( Catalog # [id number], Lot # [id number], SN [id number]). It was anastomosed to the aortic arch in an end to end manner using 3-0 prolene continuous suture with a felt strip to reinforce the anastomisis. A light coating of CoSeal was applied to seal needle holes.  Then antegrade cerebral perfusion was stopped.  The clamp was removed from the innominate artery.  Cardiopulmonary bypass was then slowly started and gradually increased. The aortic graft was cross-clamped and full CPB support was resumed. Circulatory arrest time was 41min.  Antegrade cerebral perfusion time was 26 minutes.   Bentall Procedure:   The ascending aorta was mobilized from the right pulmonary artery and main PA.  There was hematoma over the anterior medial aspect of the aorta extending down between the aorta and the PA and extending distally to the aortic arch.  There is also hematoma proximally around the aortic root.  The aorta was opened longitudinally and there was a 3 cm tear anterior medially just above the sinotubular junction.  The aortic valve was examined.  It was a true bicuspid valve with 2 leaflets.  There were a few small calcifications along the annulus.  The dissection had extended down behind the anterior medial commissure of the valve and the tissue in this area was friable.  There is also significant hematoma extending down between the aortic root and the right ventricular outflow tract as well as medially between the aortic root  and pulmonary artery.  I think there is probably a contained rupture of the aorta anterior medially over the area of the dissection tear.  I was concerned about the stability of the anterior medial commissure of the aortic valve and the extensive amount of hematoma around the aortic root along the tissue planes that would have to be developed to allow a valve sparing root replacement.  Since the conditions were questionable I felt it would be best to not try a valve sparing aortic root replacement and perform a Bentall procedure.  The right and left coronary arteries were removed from the aortic root with a button of aortic wall around the ostia. They were retracted carefully out of the way with stay sutures to prevent rotation. The native valve was excised taking care to remove all particulate debri. The annulus was sized and a 23 mm St. Jude Mechanical Valved Graft was chosen. ( Ref # [id number], Serial # [id number]) A series of pledgetted 2-0 Ethibond horizontal mattress sutures were placed around the annulus with the pledgets in a sub-annular position. The sutures were placed through a strip of autologous pericardium to reinforce the annulus and then the valve sewing ring. The valve was lowered into place and the sutures  at the hinge posts tied first followed by the remaining sutures. The valve seated nicely. The discs moved normally. Small openings were made in the graft for the coronary anastomoses using a thermal cautery. Then the left and right coronary buttons were anastomosed to the graft in an end to side manner using continuous 5-0 prolene suture. A light coating of CoSeal was applied to each anastomosis for hemostasis. The two grafts were then cut to the appropriate length and anastomosed end to end using continuous 3-0 prolene suture. CoSeal was applied to seal the needle holes in the grafts. A vent cannula was placed into the graft to remove any air. Deairing maneuvers were performed and the bed placed  in trendelenburg position.   Completion:  The patient was rewarmed to 37 degrees Centigrade.  The crossclamp was removed with a time of 180 minutes. There was spontaneous return of sinus rhythm. The position of the grafts was satisfactory. The vascular anastomoses all appeared hemostatic. Two temporary epicardial pacing wires were placed on the right atrium and two on the right ventricle. The patient was weaned from CPB without difficulty on no inotropes. CPB time was 233 minutes. Cardiac output was 5 LPM.  TEE showed a normal functioning aortic valve prosthesis with tiny washing jets around the pivot points of the leaflets.  LV function was normal.  RV function appeared normal.  There was no MR.  Heparin was fully reversed with protamine and the venous cannula removed.  The right axillary artery graft was ligated with a #1 silk tie leaving a short stump.  It was transected and then the stump was suture ligated with 3-0 Prolene pledgeted horizontal mattress suture.  Hemostasis was achieved. Mediastinal drainage tubes were placed. The sternum was closed with double #6 stainless steel wires. The fascia was closed with continuous # 1 vicryl suture. The subcutaneous tissue was closed with 2-0 vicryl continuous suture. The skin was closed with 3-0 vicryl subcuticular suture. All sponge, needle, and instrument counts were reported correct at the end of the case. Dry sterile dressings were placed over the incisions and around the chest tubes which were connected to pleurevac suction. The patient was then transported to the surgical intensive care unit in stable condition.

## 2018-09-19 NOTE — Anesthesia Postprocedure Evaluation (Signed)
Anesthesia Post Note  Patient: Henry Duncan  Procedure(s) Performed: BENTALL PROCEDURE WITH 23 [place]. (N/A Chest) TRANSESOPHAGEAL ECHOCARDIOGRAM (TEE) (N/A )     Patient location during evaluation: SICU Anesthesia Type: General Level of consciousness: sedated and patient remains intubated per anesthesia plan Pain management: pain level controlled Vital Signs Assessment: post-procedure vital signs reviewed and stable Respiratory status: patient remains intubated per anesthesia plan and patient on ventilator - see flowsheet for VS Cardiovascular status: stable Postop Assessment: no apparent nausea or vomiting Anesthetic complications: no    Last Vitals:  Vitals:   09/19/18 2110 09/19/18 2113  BP:    Pulse:    Resp:  (!) 24  Temp:  36.8 C  SpO2: 99% 99%    Last Pain:  Vitals:   09/19/18 1800  TempSrc:   PainSc: 0-No pain                 Johann Santone COKER

## 2018-09-19 NOTE — Progress Notes (Signed)
  Echocardiogram Echocardiogram Transesophageal has been performed.  Henry Duncan 09/19/2018, 8:52 AM

## 2018-09-19 NOTE — Procedures (Signed)
Extubation Procedure Note  Patient Details:   Name: Henry Duncan DOB: [birth year] MRN: [id number]   Airway Documentation:    Vent end date: 09/19/18 Vent end time: 2153   Evaluation  O2 sats: stable throughout Complications: No apparent complications Patient did tolerate procedure well. Bilateral Breath Sounds: Diminished   Yes  Graciella Freer 09/19/2018, 9:57 PM  Pt extubated per rapid wean protocol. Pt performed NIF of -30 and VC of 630 mL. Pt had postiive cuff leak. Pt placed on 5L [place] at this time. RT will continue to monitor as needed.

## 2018-09-19 NOTE — Anesthesia Procedure Notes (Signed)
Central Venous Catheter Insertion Performed by: Roberts Gaudy, MD, anesthesiologist Start/End4/27/2020 6:40 AM, 09/19/2018 7:00 AM Patient location: Pre-op. Preanesthetic checklist: patient identified, IV checked, site marked, risks and benefits discussed, surgical consent, monitors and equipment checked, pre-op evaluation, timeout performed and anesthesia consent Lidocaine 1% used for infiltration and patient sedated Hand hygiene performed  and maximum sterile barriers used  Catheter size: 8 Fr Total catheter length 16. Central line was placed.Double lumen Procedure performed using ultrasound guided technique. Ultrasound Notes:image(s) printed for medical record Attempts: 1 Following insertion, dressing applied and line sutured. Post procedure assessment: blood return through all ports  Patient tolerated the procedure well with no immediate complications.

## 2018-09-19 NOTE — Brief Op Note (Signed)
09/16/2018 - 09/19/2018  12:38 PM  PATIENT:  Henry Duncan  28 y.o. male  PRE-OPERATIVE DIAGNOSIS:  TAA  POST-OPERATIVE DIAGNOSIS:  TAA  PROCEDURE:  Procedure(s): BENTALL PROCEDURE WITH 23 ST. JUDE GRAFT CONDUIT. (N/A) TRANSESOPHAGEAL ECHOCARDIOGRAM (TEE) (N/A)  SURGEON:  Surgeon(s) and Role:    * Bartle, Fernande Boyden, MD - Primary  PHYSICIAN ASSISTANT: Teauna Dubach PA-C  ANESTHESIA:   general  EBL:  800 mL   BLOOD ADMINISTERED:see anesthesia and perfusion records for details  DRAINS: PLEURAL AND PERICARDIAL CHEST DRAINS   LOCAL MEDICATIONS USED:  NONE  SPECIMEN:  Source of Specimen:  AORTIC ANEYRYSM AND AORTIC VALVE LEAFLETS  DISPOSITION OF SPECIMEN:  PATHOLOGY  COUNTS:  YES  TOURNIQUET:  * No tourniquets in log *  DICTATION: .Dragon Dictation  PLAN OF CARE: Admit to inpatient   PATIENT DISPOSITION:  ICU - intubated and hemodynamically stable.   Delay start of Pharmacological VTE agent (>24hrs) due to surgical blood loss or risk of bleeding: yes  COMPLICATIONS: NO KNOWN

## 2018-09-19 NOTE — Anesthesia Preprocedure Evaluation (Signed)
Anesthesia Evaluation  Patient identified by MRN, date of birth, ID band Patient awake    Reviewed: Allergy & Precautions, NPO status , Patient's Chart, lab work & pertinent test results  Airway Mallampati: III  TM Distance: >3 FB Neck ROM: Full    Dental  (+) Teeth Intact, Dental Advisory Given   Pulmonary    breath sounds clear to auscultation       Cardiovascular  Rhythm:Regular Rate:Normal     Neuro/Psych    GI/Hepatic   Endo/Other    Renal/GU      Musculoskeletal   Abdominal   Peds  Hematology   Anesthesia Other Findings   Reproductive/Obstetrics                             Anesthesia Physical Anesthesia Plan  ASA: IV  Anesthesia Plan: General   Post-op Pain Management:    Induction: Intravenous  PONV Risk Score and Plan: Ondansetron and Dexamethasone  Airway Management Planned: Oral ETT and Natural Airway  Additional Equipment: Arterial line, CVP, PA Cath, 3D TEE and Ultrasound Guidance Line Placement  Intra-op Plan:   Post-operative Plan: Post-operative intubation/ventilation  Informed Consent: I have reviewed the patients History and Physical, chart, labs and discussed the procedure including the risks, benefits and alternatives for the proposed anesthesia with the patient or authorized representative who has indicated his/her understanding and acceptance.     Dental advisory given  Plan Discussed with: CRNA and Anesthesiologist  Anesthesia Plan Comments:         Anesthesia Quick Evaluation

## 2018-09-19 NOTE — Progress Notes (Signed)
  Echocardiogram Echocardiogram Transesophageal has been performed.  Henry Duncan 09/19/2018, 8:59 AM

## 2018-09-19 NOTE — Progress Notes (Signed)
Rapid wean in progress at this time.

## 2018-09-19 NOTE — Anesthesia Procedure Notes (Signed)
Central Venous Catheter Insertion Performed by: Roberts Gaudy, MD, anesthesiologist Start/End4/27/2020 7:40 AM, 09/19/2018 8:00 AM Patient location: Pre-op. Preanesthetic checklist: patient identified, IV checked, site marked, risks and benefits discussed, surgical consent, monitors and equipment checked, pre-op evaluation, timeout performed and anesthesia consent Lidocaine 1% used for infiltration and patient sedated Hand hygiene performed  and maximum sterile barriers used  Catheter size: 8.5 Fr Sheath introducer Procedure performed using ultrasound guided technique. Ultrasound Notes:anatomy identified, needle tip was noted to be adjacent to the nerve/plexus identified, no ultrasound evidence of intravascular and/or intraneural injection and image(s) printed for medical record Attempts: 1 Following insertion, line sutured and dressing applied. Post procedure assessment: blood return through all ports, free fluid flow and no air  Patient tolerated the procedure well with no immediate complications.

## 2018-09-19 NOTE — Progress Notes (Signed)
TCTS:  He has been stable over the weekend with no chest pain. Plan valve sparing aortic replacement vs Bentall depending on what the valve looks like. He has no further questions.

## 2018-09-20 ENCOUNTER — Inpatient Hospital Stay (HOSPITAL_COMMUNITY): Payer: BLUE CROSS/BLUE SHIELD

## 2018-09-20 LAB — BASIC METABOLIC PANEL
Anion gap: 8 (ref 5–15)
Anion gap: 10 (ref 5–15)
BUN: 8 mg/dL (ref 6–20)
BUN: 7 mg/dL (ref 6–20)
CO2: 21 mmol/L — ABNORMAL LOW (ref 22–32)
CO2: 24 mmol/L (ref 22–32)
Calcium: 7.9 mg/dL — ABNORMAL LOW (ref 8.9–10.3)
Calcium: 8.5 mg/dL — ABNORMAL LOW (ref 8.9–10.3)
Chloride: 105 mmol/L (ref 98–111)
Chloride: 98 mmol/L (ref 98–111)
Creatinine, Ser: 0.61 mg/dL (ref 0.61–1.24)
Creatinine, Ser: 0.59 mg/dL — ABNORMAL LOW (ref 0.61–1.24)
GFR calc Af Amer: >60 mL/min (ref >60)
GFR calc Af Amer: >60 mL/min (ref >60)
GFR calc non Af Amer: >60 mL/min (ref >60)
GFR calc non Af Amer: >60 mL/min (ref >60)
Glucose, Bld: 138 mg/dL — ABNORMAL HIGH (ref 70–99)
Glucose, Bld: 126 mg/dL — ABNORMAL HIGH (ref 70–99)
Potassium: 4.4 mmol/L (ref 3.5–5.1)
Potassium: 3.6 mmol/L (ref 3.5–5.1)
Sodium: 134 mmol/L — ABNORMAL LOW (ref 135–145)
Sodium: 132 mmol/L — ABNORMAL LOW (ref 135–145)

## 2018-09-20 LAB — GLUCOSE, CAPILLARY
Glucose-Capillary: 104 mg/dL — ABNORMAL HIGH (ref 70–99)
Glucose-Capillary: 107 mg/dL — ABNORMAL HIGH (ref 70–99)
Glucose-Capillary: 115 mg/dL — ABNORMAL HIGH (ref 70–99)
Glucose-Capillary: 121 mg/dL — ABNORMAL HIGH (ref 70–99)
Glucose-Capillary: 124 mg/dL — ABNORMAL HIGH (ref 70–99)
Glucose-Capillary: 125 mg/dL — ABNORMAL HIGH (ref 70–99)
Glucose-Capillary: 136 mg/dL — ABNORMAL HIGH (ref 70–99)
Glucose-Capillary: 174 mg/dL — ABNORMAL HIGH (ref 70–99)

## 2018-09-20 LAB — CBC
HCT: 25.5 % — ABNORMAL LOW (ref 39.0–52.0)
HCT: 25.7 % — ABNORMAL LOW (ref 39.0–52.0)
Hemoglobin: 8.7 g/dL — ABNORMAL LOW (ref 13.0–17.0)
Hemoglobin: 8.7 g/dL — ABNORMAL LOW (ref 13.0–17.0)
MCH: 34.5 pg — ABNORMAL HIGH (ref 26.0–34.0)
MCH: 34.3 pg — ABNORMAL HIGH (ref 26.0–34.0)
MCHC: 34.1 g/dL (ref 30.0–36.0)
MCHC: 33.9 g/dL (ref 30.0–36.0)
MCV: 101.2 fL — ABNORMAL HIGH (ref 80.0–100.0)
MCV: 101.2 fL — ABNORMAL HIGH (ref 80.0–100.0)
Platelets: 144 10*3/uL — ABNORMAL LOW (ref 150–400)
Platelets: 172 10*3/uL (ref 150–400)
RBC: 2.52 MIL/uL — ABNORMAL LOW (ref 4.22–5.81)
RBC: 2.54 MIL/uL — ABNORMAL LOW (ref 4.22–5.81)
RDW: 13.1 % (ref 11.5–15.5)
RDW: 13.1 % (ref 11.5–15.5)
WBC: 14 10*3/uL — ABNORMAL HIGH (ref 4.0–10.5)
WBC: 19.2 10*3/uL — ABNORMAL HIGH (ref 4.0–10.5)
nRBC: 0 % (ref 0.0–0.2)
nRBC: 0 % (ref 0.0–0.2)

## 2018-09-20 LAB — MAGNESIUM
Magnesium: 2.7 mg/dL — ABNORMAL HIGH (ref 1.7–2.4)
Magnesium: 2.3 mg/dL (ref 1.7–2.4)

## 2018-09-20 MED ORDER — KETOROLAC TROMETHAMINE 15 MG/ML IJ SOLN
15.0000 mg | Freq: Four times a day (QID) | INTRAMUSCULAR | Status: DC | PRN
  Administered 2018-09-20 – 2018-09-21 (×5): 15 mg via INTRAVENOUS
  Filled 2018-09-20 (×6): qty 1

## 2018-09-20 MED ORDER — INSULIN ASPART 100 UNIT/ML ~~LOC~~ SOLN
0.0000 [IU] | SUBCUTANEOUS | Status: DC
  Administered 2018-09-20 (×2): 2 [IU] via SUBCUTANEOUS

## 2018-09-20 MED ORDER — FUROSEMIDE 10 MG/ML IJ SOLN
40.0000 mg | Freq: Once | INTRAMUSCULAR | Status: AC
  Administered 2018-09-20: 40 mg via INTRAVENOUS
  Filled 2018-09-20: qty 4

## 2018-09-20 MED ORDER — ENOXAPARIN SODIUM 40 MG/0.4ML ~~LOC~~ SOLN
40.0000 mg | Freq: Every day | SUBCUTANEOUS | Status: DC
  Administered 2018-09-20 – 2018-09-21 (×2): 40 mg via SUBCUTANEOUS
  Filled 2018-09-20 (×2): qty 0.4

## 2018-09-20 MED ORDER — KETOROLAC TROMETHAMINE 15 MG/ML IJ SOLN
15.0000 mg | Freq: Once | INTRAMUSCULAR | Status: AC
  Administered 2018-09-20: 15 mg via INTRAVENOUS
  Filled 2018-09-20: qty 1

## 2018-09-20 MED ORDER — WARFARIN - PHYSICIAN DOSING INPATIENT
Freq: Every day | Status: DC
  Administered 2018-09-20 – 2018-09-25 (×3)

## 2018-09-20 MED ORDER — COUMADIN BOOK
Freq: Once | Status: AC
  Administered 2018-09-20: 17:00:00

## 2018-09-20 MED ORDER — WARFARIN SODIUM 2.5 MG PO TABS
2.5000 mg | ORAL_TABLET | Freq: Every day | ORAL | Status: DC
  Administered 2018-09-20 – 2018-09-21 (×2): 2.5 mg via ORAL
  Filled 2018-09-20 (×2): qty 1

## 2018-09-20 MED ORDER — POTASSIUM CHLORIDE 10 MEQ/50ML IV SOLN
10.0000 meq | INTRAVENOUS | Status: AC
  Administered 2018-09-20 (×3): 10 meq via INTRAVENOUS
  Filled 2018-09-20 (×3): qty 50

## 2018-09-20 MED FILL — Thrombin (Recombinant) For Soln 20000 Unit: CUTANEOUS | Qty: 1 | Status: AC

## 2018-09-20 NOTE — Progress Notes (Signed)
>  Assisted tele visit to patient with multiple family members including parents. Corene Cornea, RN

## 2018-09-20 NOTE — Addendum Note (Signed)
Addendum  created 09/20/18 1547 by Roberts Gaudy, MD   Clinical Note Signed

## 2018-09-20 NOTE — Progress Notes (Signed)
EVENING ROUNDS NOTE :     [address]       [place] [address]             [phone]                 1 Day Post-Op Procedure(s) (LRB): BENTALL PROCEDURE WITH 23 ST. JUDE GRAFT CONDUIT. (N/A) TRANSESOPHAGEAL ECHOCARDIOGRAM (TEE) (N/A)  Total Length of Stay:  LOS: 4 days  BP 106/65   Pulse (!) 114   Temp 98.2 F (36.8 C) (Oral)   Resp (!) 24   Ht 5\' 7"  (1.702 m)   Wt 88 kg   SpO2 94%   BMI 30.39 kg/m   .Intake/Output      04/27 0701 - 04/28 0700 04/28 0701 - 04/29 0700   P.O. 544    I.V. (mL/kg) 3177.6 (36.1) 650.6 (7.4)   Blood 300    IV Piggyback 1297.8 163.8   Total Intake(mL/kg) 5319.4 (60.4) 814.5 (9.3)   Urine (mL/kg/hr) 2305 (1.1) 1585 (1.6)   Stool     Blood 800    Chest Tube 930 200   Total Output 4035 1785   Net +1284.4 -970.5          . sodium chloride    . sodium chloride    . sodium chloride 20 mL/hr at 09/20/18 1800  . cefUROXime (ZINACEF)  IV Stopped (09/20/18 1744)  . lactated ringers    . lactated ringers Stopped (09/20/18 1743)  . nitroGLYCERIN Stopped (09/19/18 1530)  . phenylephrine (NEO-SYNEPHRINE) Adult infusion Stopped (09/20/18 0834)  . potassium chloride 10 mEq (09/20/18 1743)     Lab Results  Component Value Date   WBC 19.2 (H) 09/20/2018   HGB 8.7 (L) 09/20/2018   HCT 25.7 (L) 09/20/2018   PLT 172 09/20/2018   GLUCOSE 126 (H) 09/20/2018   ALT 69 (H) 09/16/2018   AST 51 (H) 09/16/2018   NA 132 (L) 09/20/2018   K 3.6 09/20/2018   CL 98 09/20/2018   CREATININE 0.59 (L) 09/20/2018   BUN 7 09/20/2018   CO2 24 09/20/2018   INR 1.5 (H) 09/19/2018   HGBA1C 5.2 09/16/2018   Awake and alert  Stable    Grace Isaac MD  Beeper [phone] Office [phone] 09/20/2018 6:23 PM

## 2018-09-20 NOTE — Progress Notes (Addendum)
TCTS DAILY ICU PROGRESS NOTE                   [address]            [place] [address]          [phone]   1 Day Post-Op Procedure(s) (LRB): BENTALL PROCEDURE WITH 23 ST. JUDE GRAFT CONDUIT. (N/A) TRANSESOPHAGEAL ECHOCARDIOGRAM (TEE) (N/A)  Total Length of Stay:  LOS: 4 days   Subjective:  Patient doing better this morning.  States his pain was pretty bad overnight and had difficulty getting relief.  He denies N/V  Objective: Vital signs in last 24 hours: Temp:  [96.6 F (35.9 C)-98.4 F (36.9 C)] 97.7 F (36.5 C) (04/28 0700) Pulse Rate:  [80-120] 108 (04/28 0700) Cardiac Rhythm: Normal sinus rhythm;Sinus tachycardia (04/28 0400) Resp:  [12-39] 26 (04/28 0700) BP: (61-120)/(45-83) 93/62 (04/28 0700) SpO2:  [89 %-100 %] 99 % (04/28 0700) Arterial Line BP: (59-114)/(39-78) 110/67 (04/27 2300) FiO2 (%):  [40 %-70 %] 40 % (04/27 2133) Weight:  [88 kg] 88 kg (04/28 0500)  Filed Weights   09/18/18 0500 09/19/18 0500 09/20/18 0500  Weight: 82.1 kg 81.3 kg 88 kg    Weight change: 6.67 kg   Hemodynamic parameters for last 24 hours: PAP: (16-37)/(2-19) 23/8 CO:  [2.8 L/min-5.5 L/min] 5.5 L/min CI:  [1.4 L/min/m2-2.9 L/min/m2] 2.9 L/min/m2  Intake/Output from previous day: 04/27 0701 - 04/28 0700 In: 5319.4 [P.O.:544; I.V.:3177.6; Blood:300; IV Piggyback:1297.8] Out: 4035 [Urine:2305; Blood:800; Chest Tube:930]   Current Meds: Scheduled Meds:  acetaminophen  1,000 mg Oral Q6H   Or   acetaminophen (TYLENOL) oral liquid 160 mg/5 mL  1,000 mg Per Tube Q6H   aspirin EC  325 mg Oral Daily   Or   aspirin  324 mg Per Tube Daily   bisacodyl  10 mg Oral Daily   Or   bisacodyl  10 mg Rectal Daily   Chlorhexidine Gluconate Cloth  6 each Topical Daily   docusate sodium  200 mg Oral Daily   enoxaparin (LOVENOX) injection  40 mg Subcutaneous QHS   furosemide  40 mg Intravenous Once   insulin aspart  0-24 Units Subcutaneous Q4H   insulin  aspart  0-24 Units Subcutaneous Q4H   metoprolol tartrate  12.5 mg Oral BID   Or   metoprolol tartrate  12.5 mg Per Tube BID   mupirocin ointment  1 application Nasal BID   [START ON 09/21/2018] pantoprazole  40 mg Oral Daily   sodium chloride flush  10-40 mL Intracatheter Q12H   sodium chloride flush  3 mL Intravenous Q12H   warfarin  2.5 mg Oral q1800   Continuous Infusions:  sodium chloride     sodium chloride     sodium chloride 20 mL/hr at 09/19/18 1600   albumin human 12.5 g (09/19/18 1847)   cefUROXime (ZINACEF)  IV Stopped (09/20/18 0603)   dexmedetomidine (PRECEDEX) IV infusion Stopped (09/20/18 1540)   lactated ringers     lactated ringers     lactated ringers 20 mL/hr at 09/20/18 0700   nitroGLYCERIN Stopped (09/19/18 1530)   phenylephrine (NEO-SYNEPHRINE) Adult infusion 60 mcg/min (09/20/18 0700)   PRN Meds:.sodium chloride, albumin human, ipratropium-albuterol, ketorolac, lactated ringers, metoprolol tartrate, midazolam, morphine injection, ondansetron (ZOFRAN) IV, oxyCODONE, sodium chloride flush, sodium chloride flush, traMADol  General appearance: alert, cooperative and visible edema along face/eyes Heart: regular rate and rhythm and tachy Lungs: clear to auscultation bilaterally Abdomen: soft, non-tender; bowel  sounds normal; no masses,  no organomegaly Extremities: edema trace BLE, visibly edematous in bilateral hands Wound: clean and dry  Lab Results: CBC: Recent Labs    09/19/18 2130  09/19/18 2312 09/20/18 0415  WBC 16.8*  --   --  14.0*  HGB 9.7*   < > 8.5* 8.7*  HCT 28.3*   < > 25.0* 25.5*  PLT 168  --   --  144*   < > = values in this interval not displayed.   BMET:  Recent Labs    09/19/18 2130  09/19/18 2312 09/20/18 0415  NA 139   < > 138 134*  K 4.8   < > 5.1 4.4  CL 110  --   --  105  CO2 20*  --   --  21*  GLUCOSE 137*  --   --  138*  BUN 8  --   --  8  CREATININE 0.74  --   --  0.61  CALCIUM 7.9*  --   --  7.9*     < > = values in this interval not displayed.    CMET: Lab Results  Component Value Date   WBC 14.0 (H) 09/20/2018   HGB 8.7 (L) 09/20/2018   HCT 25.5 (L) 09/20/2018   PLT 144 (L) 09/20/2018   GLUCOSE 138 (H) 09/20/2018   ALT 69 (H) 09/16/2018   AST 51 (H) 09/16/2018   NA 134 (L) 09/20/2018   K 4.4 09/20/2018   CL 105 09/20/2018   CREATININE 0.61 09/20/2018   BUN 8 09/20/2018   CO2 21 (L) 09/20/2018   INR 1.5 (H) 09/19/2018   HGBA1C 5.2 09/16/2018      PT/INR:  Recent Labs    09/19/18 1526  LABPROT 17.5*  INR 1.5*   Radiology: Dg Chest Port 1 View  Result Date: 09/20/2018 CLINICAL DATA:  Status post ascending aortic aneurysm repair. EXAM: PORTABLE CHEST 1 VIEW COMPARISON:  Radiograph of September 19, 2018. FINDINGS: Stable cardiomediastinal silhouette. Endotracheal and nasogastric tubes have been removed. Right internal jugular Swan-Ganz catheter is noted with tip directed into right main pulmonary artery. Right internal jugular catheter is noted with distal tip in expected position cavoatrial junction. No pneumothorax is noted. Right lung is clear. Left basilar atelectasis or infiltrate is noted with associated pleural effusion. Bony thorax is unremarkable. IMPRESSION: Endotracheal and nasogastric tubes have been removed. Stable left basilar opacity concerning for atelectasis or infiltrate with associated pleural effusion. Electronically Signed   By: Marijo Conception M.D.   On: 09/20/2018 07:09   Dg Chest Port 1 View  Result Date: 09/19/2018 CLINICAL DATA:  28 year old male postoperative day zero Bentall procedure 4 bicuspid aortic valve, ascending aortic aneurysm and focal type a dissection. EXAM: PORTABLE CHEST 1 VIEW COMPARISON:  1614 hours today and earlier. FINDINGS: Portable AP view at 2053 hours. Endotracheal tube tip in good position between the clavicles and carina. Enteric tube tip is at the level of the gastric body. Right IJ central line and right IJ approach Swan-Ganz  catheter are stable. Mediastinal tubes are stable. Stable lung volumes with interval improved left upper lobe ventilation, resolving confluent left perihilar opacity. No pneumothorax. Possible layering left pleural fluid. The right lung is clear allowing for portable technique. Stable enlarged cardiac and mediastinal contours. Negative visible bowel gas pattern. IMPRESSION: 1. Stable lines and tubes. 2. Improved left upper lobe ventilation. Suspect layering left pleural fluid. 3. No pneumothorax or pulmonary edema. Electronically Signed   By:  Genevie Ann M.D.   On: 09/19/2018 21:16   Dg Chest Port 1 View  Result Date: 09/19/2018 CLINICAL DATA:  Post thoracic aortic aneurysm repair. EXAM: PORTABLE CHEST 1 VIEW COMPARISON:  09/18/2018 FINDINGS: Endotracheal tube with tip 5.6 cm above the carina. Enteric tube has tip over the stomach in the left upper quadrant. Mediastinal drain is present. Right IJ central venous catheter has tip over the SVC. Right IJ Swan-Ganz catheter has tip over the right main pulmonary artery. Possible medial left basilar chest tube. Right lung is clear. Opacification throughout the left lung worse over the upper lobe. Suggestion of mild pneumomediastinum with air along the left heart border. Mild stable cardiomegaly. Remainder the exam is unchanged. IMPRESSION: Opacification of the left lung worse over the upper lung. Suggestion of mild pneumomediastinum. Findings likely related to patient's recent thoracic aneurysm repair. Multiple tubes and lines as described. Stable cardiomegaly. Electronically Signed   By: Marin Olp M.D.   On: 09/19/2018 16:41     Assessment/Plan: S/P Procedure(s) (LRB): BENTALL PROCEDURE WITH 23 ST. JUDE GRAFT CONDUIT. (N/A) TRANSESOPHAGEAL ECHOCARDIOGRAM (TEE) (N/A)  1. CV- mild Sinus Tachycardia, remains on Neo for BP, weaning as tolerated 2. Pulm- bilateral atelectasis, no pneumothorax present, wean oxygen as tolerated, aggressive IS 3. Renal- creatinine  WNL, weight is up about 15 lbs since admission, will give IV lasix today, K is at 4.4 4. Expected post operative blood loss anemia, Hgb at 8.7, watch 5. CBGs controlled, transition to SSIP coverage 6. Pain control- will give a few doses of 15 mg Toradol prn for breakthrough pain 7. Dispo- patient stable, visibly edematous on exam, will give IV lasix today, wean Neo as BP allows, start low dose coumadin for mechanical valve, POD #1 progression orders     Ellwood Handler 09/20/2018 7:48 AM    Chart reviewed, patient examined, agree with above. Extubated last night. CXR this am looks better with some atelectasis in the left base, mild interstitial edema. Will diurese some.  Rhythm is sinus tachy. Continue Lopressor.  He says pain control much better after Toradol so will continue.   CT output low but will keep in for now.  DC swan, arterial line, OOB, IS.

## 2018-09-20 NOTE — Progress Notes (Signed)
Anesthesiology Follow-up:  Awake and alert, sitting in chair, ambulating with assistance, mild-moderate incisional pain. He remains tachycardic, but otherwise hemodynamically stable.  VS: T- 37.1 BP- 112/64 HR- 123 (SR) RR- 24 O2 Sat 96% on RA PA 31/10 CO/CI- 6.1/3.1  K-4.4 BUN/Cr.- 8/0.61 glucose- 102 H/H- 8.7/25.7 platelets- 172,000  CXR: L.lung atelectasis vs infiltrate, improved from yesterday.  Extubated 6 hours post-op.  28 year old male one day S/P Bentall Procedure with aortic valve replacement for aneurysm with dissection. Remains tachycardic, but otherwise stable, has L lung opacity which is improving and respiratory status appears stable.   Roberts Gaudy, MD

## 2018-09-21 ENCOUNTER — Encounter (HOSPITAL_COMMUNITY): Payer: Self-pay | Admitting: Surgery

## 2018-09-21 ENCOUNTER — Inpatient Hospital Stay (HOSPITAL_COMMUNITY): Payer: BLUE CROSS/BLUE SHIELD

## 2018-09-21 ENCOUNTER — Encounter: Payer: Self-pay | Admitting: Surgery

## 2018-09-21 LAB — BASIC METABOLIC PANEL
Anion gap: 8 (ref 5–15)
BUN: 8 mg/dL (ref 6–20)
CO2: 25 mmol/L (ref 22–32)
Calcium: 8.3 mg/dL — ABNORMAL LOW (ref 8.9–10.3)
Chloride: 101 mmol/L (ref 98–111)
Creatinine, Ser: 0.59 mg/dL — ABNORMAL LOW (ref 0.61–1.24)
GFR calc Af Amer: >60 mL/min (ref >60)
GFR calc non Af Amer: >60 mL/min (ref >60)
Glucose, Bld: 112 mg/dL — ABNORMAL HIGH (ref 70–99)
Potassium: 4 mmol/L (ref 3.5–5.1)
Sodium: 134 mmol/L — ABNORMAL LOW (ref 135–145)

## 2018-09-21 LAB — CBC
HCT: 25.6 % — ABNORMAL LOW (ref 39.0–52.0)
Hemoglobin: 8.7 g/dL — ABNORMAL LOW (ref 13.0–17.0)
MCH: 34.5 pg — ABNORMAL HIGH (ref 26.0–34.0)
MCHC: 34 g/dL (ref 30.0–36.0)
MCV: 101.6 fL — ABNORMAL HIGH (ref 80.0–100.0)
Platelets: 142 10*3/uL — ABNORMAL LOW (ref 150–400)
RBC: 2.52 MIL/uL — ABNORMAL LOW (ref 4.22–5.81)
RDW: 13.2 % (ref 11.5–15.5)
WBC: 14.3 10*3/uL — ABNORMAL HIGH (ref 4.0–10.5)
nRBC: 0 % (ref 0.0–0.2)

## 2018-09-21 LAB — PROTIME-INR
INR: 1.2 (ref 0.8–1.2)
Prothrombin Time: 14.7 seconds (ref 11.4–15.2)

## 2018-09-21 LAB — GLUCOSE, CAPILLARY: Glucose-Capillary: 98 mg/dL (ref 70–99)

## 2018-09-21 MED ORDER — POTASSIUM CHLORIDE CRYS ER 20 MEQ PO TBCR
20.0000 meq | EXTENDED_RELEASE_TABLET | Freq: Two times a day (BID) | ORAL | Status: AC
  Administered 2018-09-21 (×2): 20 meq via ORAL
  Filled 2018-09-21 (×2): qty 1

## 2018-09-21 MED ORDER — FUROSEMIDE 10 MG/ML IJ SOLN
40.0000 mg | Freq: Once | INTRAMUSCULAR | Status: AC
  Administered 2018-09-21: 40 mg via INTRAVENOUS
  Filled 2018-09-21: qty 4

## 2018-09-21 MED ORDER — METOPROLOL TARTRATE 25 MG PO TABS
25.0000 mg | ORAL_TABLET | Freq: Two times a day (BID) | ORAL | Status: DC
  Administered 2018-09-21 – 2018-09-22 (×3): 25 mg via ORAL
  Filled 2018-09-21 (×3): qty 1

## 2018-09-21 MED ORDER — FE FUMARATE-B12-VIT C-FA-IFC PO CAPS
1.0000 | ORAL_CAPSULE | Freq: Two times a day (BID) | ORAL | Status: DC
  Administered 2018-09-21 – 2018-09-27 (×13): 1 via ORAL
  Filled 2018-09-21 (×13): qty 1

## 2018-09-21 MED ORDER — METOPROLOL TARTRATE 25 MG/10 ML ORAL SUSPENSION: 25.0000 mg | Freq: Two times a day (BID) | ORAL | Status: DC

## 2018-09-21 MED FILL — Lidocaine HCl(Cardiac) IV PF Soln Pref Syr 100 MG/5ML (2%): INTRAVENOUS | Qty: 5 | Status: AC

## 2018-09-21 MED FILL — Potassium Chloride Inj 2 mEq/ML: INTRAVENOUS | Qty: 40 | Status: AC

## 2018-09-21 MED FILL — Mannitol IV Soln 20%: INTRAVENOUS | Qty: 500 | Status: AC

## 2018-09-21 MED FILL — Electrolyte-R (PH 7.4) Solution: INTRAVENOUS | Qty: 6000 | Status: AC

## 2018-09-21 MED FILL — Sodium Chloride IV Soln 0.9%: INTRAVENOUS | Qty: 2000 | Status: AC

## 2018-09-21 MED FILL — Magnesium Sulfate Inj 50%: INTRAMUSCULAR | Qty: 10 | Status: AC

## 2018-09-21 MED FILL — Heparin Sodium (Porcine) Inj 1000 Unit/ML: INTRAMUSCULAR | Qty: 10 | Status: AC

## 2018-09-21 MED FILL — Heparin Sodium (Porcine) Inj 1000 Unit/ML: INTRAMUSCULAR | Qty: 30 | Status: AC

## 2018-09-21 MED FILL — Albumin, Human Inj 5%: INTRAVENOUS | Qty: 500 | Status: AC

## 2018-09-21 MED FILL — Sodium Bicarbonate IV Soln 8.4%: INTRAVENOUS | Qty: 50 | Status: AC

## 2018-09-21 NOTE — Progress Notes (Signed)
TCTS BRIEF SICU PROGRESS NOTE  2 Days Post-Op  S/P Procedure(s) (LRB): BENTALL PROCEDURE WITH 23 ST. JUDE GRAFT CONDUIT. (N/A) TRANSESOPHAGEAL ECHOCARDIOGRAM (TEE) (N/A)   Stable day  Plan: Continue current plan  Rexene Alberts, MD 09/21/2018 6:27 PM

## 2018-09-21 NOTE — Progress Notes (Signed)
2 Days Post-Op Procedure(s) (LRB): BENTALL PROCEDURE WITH 23 ST. JUDE GRAFT CONDUIT. (N/A) TRANSESOPHAGEAL ECHOCARDIOGRAM (TEE) (N/A) Subjective:  No complaints. Pain control better and able to sleep. Walked this am. Working on IS.  Objective: Vital signs in last 24 hours: Temp:  [97.7 F (36.5 C)-98.8 F (37.1 C)] 98 F (36.7 C) (04/29 0000) Pulse Rate:  [109-135] 132 (04/29 0700) Cardiac Rhythm: Sinus tachycardia (04/29 0400) Resp:  [18-35] 24 (04/29 0700) BP: (86-116)/(46-79) 102/65 (04/29 0700) SpO2:  [89 %-100 %] 95 % (04/29 0700) Weight:  [87.2 kg] 87.2 kg (04/29 0500)  Hemodynamic parameters for last 24 hours: PAP: (17-31)/(2-12) 31/6 CO:  [6.1 L/min] 6.1 L/min CI:  [3.1 L/min/m2] 3.1 L/min/m2  Intake/Output from previous day: 04/28 0701 - 04/29 0700 In: 1949.8 [P.O.:600; I.V.:1096.1; IV Piggyback:253.8] Out: 2400 [Urine:1990; Chest Tube:410] Intake/Output this shift: No intake/output data recorded.  General appearance: alert and cooperative Neurologic: intact Heart: regular rate and rhythm and crisp mechanical valve click Lungs: clear to auscultation bilaterally Extremities: edema mild Wound: dressings dry  Lab Results: Recent Labs    09/20/18 1513 09/21/18 0419  WBC 19.2* 14.3*  HGB 8.7* 8.7*  HCT 25.7* 25.6*  PLT 172 142*   BMET:  Recent Labs    09/20/18 1513 09/21/18 0419  NA 132* 134*  K 3.6 4.0  CL 98 101  CO2 24 25  GLUCOSE 126* 112*  BUN 7 8  CREATININE 0.59* 0.59*  CALCIUM 8.5* 8.3*    PT/INR:  Recent Labs    09/21/18 0419  LABPROT 14.7  INR 1.2   ABG    Component Value Date/Time   PHART 7.369 09/19/2018 2312   HCO3 20.9 09/19/2018 2312   TCO2 22 09/19/2018 2312   ACIDBASEDEF 4.0 (H) 09/19/2018 2312   O2SAT 99.0 09/19/2018 2312   CBG (last 3)  Recent Labs    09/20/18 2020 09/20/18 2357 09/21/18 0335  GLUCAP 115* 107* 98   CXR: improved aeration of left lung  Assessment/Plan: S/P Procedure(s) (LRB): BENTALL  PROCEDURE WITH 23 ST. JUDE GRAFT CONDUIT. (N/A) TRANSESOPHAGEAL ECHOCARDIOGRAM (TEE) (N/A)  POD 2 Hemodynamically stable with sinus tachy 120's. Will increase Lopressor to 25 bid.  Volume excess: wt down 2 lbs but still 13 lbs over preop. Continue diuresis and Kdur.  Expected postop blood loss anemia: stable. Start iron.  Continue Coumadin 2.5 daily.  Chest tube output 150 since MN and still bloody so will keep tubes in for now.  DC foley.  CXR improved. Continue IS, ambulation.   LOS: 5 days    Henry Duncan 09/21/2018

## 2018-09-21 NOTE — Plan of Care (Signed)

## 2018-09-22 ENCOUNTER — Inpatient Hospital Stay (HOSPITAL_COMMUNITY): Payer: BLUE CROSS/BLUE SHIELD

## 2018-09-22 LAB — CBC
HCT: 20.7 % — ABNORMAL LOW (ref 39.0–52.0)
Hemoglobin: 7 g/dL — ABNORMAL LOW (ref 13.0–17.0)
MCH: 35 pg — ABNORMAL HIGH (ref 26.0–34.0)
MCHC: 33.8 g/dL (ref 30.0–36.0)
MCV: 103.5 fL — ABNORMAL HIGH (ref 80.0–100.0)
Platelets: 156 10*3/uL (ref 150–400)
RBC: 2 MIL/uL — ABNORMAL LOW (ref 4.22–5.81)
RDW: 13.2 % (ref 11.5–15.5)
WBC: 14.1 10*3/uL — ABNORMAL HIGH (ref 4.0–10.5)
nRBC: 0 % (ref 0.0–0.2)

## 2018-09-22 LAB — BASIC METABOLIC PANEL
Anion gap: 8 (ref 5–15)
BUN: 11 mg/dL (ref 6–20)
CO2: 25 mmol/L (ref 22–32)
Calcium: 8.2 mg/dL — ABNORMAL LOW (ref 8.9–10.3)
Chloride: 100 mmol/L (ref 98–111)
Creatinine, Ser: 0.64 mg/dL (ref 0.61–1.24)
GFR calc Af Amer: >60 mL/min (ref >60)
GFR calc non Af Amer: >60 mL/min (ref >60)
Glucose, Bld: 108 mg/dL — ABNORMAL HIGH (ref 70–99)
Potassium: 3.9 mmol/L (ref 3.5–5.1)
Sodium: 133 mmol/L — ABNORMAL LOW (ref 135–145)

## 2018-09-22 LAB — GLUCOSE, CAPILLARY
Glucose-Capillary: 100 mg/dL — ABNORMAL HIGH (ref 70–99)
Glucose-Capillary: 122 mg/dL — ABNORMAL HIGH (ref 70–99)

## 2018-09-22 LAB — PREPARE RBC (CROSSMATCH)

## 2018-09-22 LAB — PROTIME-INR
INR: 1.1 (ref 0.8–1.2)
Prothrombin Time: 13.8 seconds (ref 11.4–15.2)

## 2018-09-22 MED ORDER — SODIUM CHLORIDE 0.9% FLUSH: 3.0000 mL | INTRAVENOUS | Status: DC | PRN

## 2018-09-22 MED ORDER — OXYCODONE HCL 5 MG PO TABS
5.0000 mg | ORAL_TABLET | ORAL | Status: DC | PRN
  Administered 2018-09-22 – 2018-09-27 (×23): 10 mg via ORAL
  Filled 2018-09-22 (×23): qty 2

## 2018-09-22 MED ORDER — ONDANSETRON HCL 4 MG PO TABS: 4.0000 mg | ORAL_TABLET | Freq: Four times a day (QID) | ORAL | Status: DC | PRN

## 2018-09-22 MED ORDER — POTASSIUM CHLORIDE CRYS ER 20 MEQ PO TBCR
40.0000 meq | EXTENDED_RELEASE_TABLET | Freq: Two times a day (BID) | ORAL | Status: AC
  Administered 2018-09-22 (×2): 40 meq via ORAL
  Filled 2018-09-22 (×2): qty 2

## 2018-09-22 MED ORDER — TRAMADOL HCL 50 MG PO TABS
50.0000 mg | ORAL_TABLET | ORAL | Status: DC | PRN
  Administered 2018-09-22 – 2018-09-27 (×14): 100 mg via ORAL
  Filled 2018-09-22 (×14): qty 2

## 2018-09-22 MED ORDER — FUROSEMIDE 10 MG/ML IJ SOLN
40.0000 mg | Freq: Two times a day (BID) | INTRAMUSCULAR | Status: AC
  Administered 2018-09-22 (×2): 40 mg via INTRAVENOUS
  Filled 2018-09-22 (×2): qty 4

## 2018-09-22 MED ORDER — WARFARIN SODIUM 5 MG PO TABS
5.0000 mg | ORAL_TABLET | Freq: Every day | ORAL | Status: DC
  Administered 2018-09-22: 5 mg via ORAL
  Filled 2018-09-22: qty 1

## 2018-09-22 MED ORDER — SODIUM CHLORIDE 0.9 % IV SOLN: 250.0000 mL | INTRAVENOUS | Status: DC | PRN

## 2018-09-22 MED ORDER — SODIUM CHLORIDE 0.9% FLUSH
3.0000 mL | Freq: Two times a day (BID) | INTRAVENOUS | Status: DC
  Administered 2018-09-22 – 2018-09-26 (×9): 3 mL via INTRAVENOUS

## 2018-09-22 MED ORDER — ACETAMINOPHEN 325 MG PO TABS: 650.0000 mg | ORAL_TABLET | Freq: Four times a day (QID) | ORAL | Status: DC | PRN

## 2018-09-22 MED ORDER — PANTOPRAZOLE SODIUM 40 MG PO TBEC
40.0000 mg | DELAYED_RELEASE_TABLET | Freq: Every day | ORAL | Status: DC
  Administered 2018-09-23 – 2018-09-27 (×5): 40 mg via ORAL
  Filled 2018-09-22 (×5): qty 1

## 2018-09-22 MED ORDER — SODIUM CHLORIDE 0.9% IV SOLUTION: Freq: Once | INTRAVENOUS | Status: DC

## 2018-09-22 MED ORDER — KETOROLAC TROMETHAMINE 15 MG/ML IJ SOLN
15.0000 mg | Freq: Four times a day (QID) | INTRAMUSCULAR | Status: DC | PRN
  Administered 2018-09-22 – 2018-09-24 (×6): 15 mg via INTRAVENOUS
  Filled 2018-09-22 (×7): qty 1

## 2018-09-22 MED ORDER — MOVING RIGHT ALONG BOOK
Freq: Once | Status: AC
  Administered 2018-09-22: 15:00:00

## 2018-09-22 MED ORDER — METOPROLOL TARTRATE 50 MG PO TABS
50.0000 mg | ORAL_TABLET | Freq: Two times a day (BID) | ORAL | Status: DC
  Administered 2018-09-22 – 2018-09-23 (×3): 50 mg via ORAL
  Filled 2018-09-22 (×3): qty 1

## 2018-09-22 MED ORDER — ONDANSETRON HCL 4 MG/2ML IJ SOLN: 4.0000 mg | Freq: Four times a day (QID) | INTRAMUSCULAR | Status: DC | PRN

## 2018-09-22 NOTE — Progress Notes (Signed)
Report to 4e RN

## 2018-09-22 NOTE — Progress Notes (Signed)
Pt arrived to 4e from 2h. Pt oriented to room and staff. Vitals obtained. Telemetry box applied and CCMD notified. CHG bath completed. Pt denies needs at this time. Will continue current plan of care.   Ara Kussmaul BSN, RN

## 2018-09-22 NOTE — Discharge Summary (Addendum)
Physician Discharge Summary  Patient ID: Henry Duncan MRN: [id number] DOB/AGE: 07/22/1990 28 y.o.  Admit date: 09/16/2018 Discharge date: 09/27/2018  Admission Diagnoses: Type a thoracic aneurysm with acute dissection  Discharge Diagnoses:  Active Problems:   Aortic aneurysm (HCC)   S/P ascending aortic aneurysm repair  Patient Active Problem List   Diagnosis Date Noted  . S/P ascending aortic aneurysm repair 09/19/2018  . Aortic aneurysm ([place]) 09/16/2018  . Allergic rhinitis 09/06/2018  . Abdominal pain 10/30/2016  . Elevated ALT measurement 10/30/2016  . Insomnia 11/29/2015  . Migraine 11/29/2015  . GERD (gastroesophageal reflux disease) 02/11/2015  . Acute anxiety 02/11/2015  . TENDINITIS TIBIALIS 10/24/2007   History of the present illness:  The patient is a 28 year old male who presented to the [facility] emergency room on 09/13/2018 with severe left-sided anterior chest pain that woke him from sleep and was accompanied by diaphoresis.  He was diagnosed with hypertensive emergency.  He had a CTA of the chest at that time which showed a fusiform aortic root and an ascending aortic aneurysm with maximum diameter in the mid a sending aorta 5.2 x 5.1 cm.  There was no evidence of pulmonary embolism or aortic dissection.  His blood pressure was treated acutely and he had resolution of his chest pain.  He was sent home from the emergency department with labetalol 100 mg twice daily.  He was seen in the office the next day by Dr. Cyndia Bent and his blood pressure was in much better control with systolic in the 315V.  He had not had any further chest pain.  A review of the CTA of the chest showed that he would require replacement of his aortic root and ascending aorta in the near future given its its size and his age.  The patient has no family history of aortic dissection or aortic aneurysm or connective tissue disorder but there was noted a strong family history of coronary artery  disease.  He was then scheduled for a gated cardiac CTA to evaluate his coronary arteries and a 2D echocardiogram to evaluate his aortic valve and left ventricular function in anticipation of proceeding with aneurysm repair.  The gated CTA showed a 5.7 to 5.8 cm fusiform ascending aneurysm with a localized focal dissection along the anterior medial a sending aorta just above the sinotubular junction.  Evaluation of his coronary arteries showed heavy calcification in the mid LAD concerning for possible significant stenosis.  There is no significant disease in the left main, left circumflex, or right coronary systems although there was some plaque present.  His coronary artery calcium score was in the 99th percentile for his age and gender.  It was felt he probably had a bicuspid aortic valve.  The patient was sent home from the radiology department and after we were notified it was felt that he should be admitted to the cardiovascular ICU for observation in preparation for surgical repair.  He had no further chest pain since his initial presentation on the Tuesday previous.  His blood pressure remained under good control.  He had a focal aortic dissection and as long as he remained stable without chest pain it was felt that surgery could proceed the following Monday morning.  All the risks benefits and potential complications were discussed with the patient who agreed to proceed. Discharged Condition: good  Hospital Course: The patient remained hemodynamically stable and asymptomatic over the weekend and on 09/19/2018 he was taken to the operating room where he underwent  the below described procedure.  He tolerated it well and was taken to the surgical intensive care unit in stable condition.  Post operative hospital course:  The patient has progressed well.  He was extubated using standard protocols the evening of surgery.  He has remained hemodynamically stable with some sinus tachycardia which is  multifactorial.  Beta-blocker has been titrated.  He has not had any significant cardiac dysrhythmias.  All routine lines, monitors and drainage devices have been discontinued in the standard fashion.  He did have moderate pain and received Toradol early for breakthrough pain.  This is improving over time.  He does have some postoperative volume excess which is improving with diuresis.  He does have an expected acute blood loss anemia.  Did require transfusion and has been started on iron.  He has been started on Coumadin therapy with daily INR.  His INR at time of discharge is 1.9.  He will be discharged on 5 mg of Coumadin.  He was transferred out of the ICU on 09/22/2018.  He is hypokalemic at 2.7 and has received supplementation.   He continues to make adequate improvement with his overall rehabilitation including increasing ambulation and ongoing pulmonary toilet for atelectasis.  His incisions are healing well without evidence of infection.  Oxygen has been weaned and he maintains good saturations on room air.  The patient was felt to be stable for possible discharge on 09/26/2018 however when he got out of bed he had a large amount of drainage from his incision.  An echocardiogram was obtained which revealed some pericardial fluid and it was felt that he should return to the OR for a exploration and drainage of this fluid.  This was done on 09/26/2018 by Dr. Cyndia Bent.  He tolerated it well.  All the fluid was drained.  He was kept overnight for further observation but remain quite stable and is felt at this time that he can be discharged home.    Consults: None  Significant Diagnostic Studies: angiography: CTA/echocaardiogram  Treatments: surgery:  CARDIOVASCULAR SURGERY OPERATIVE NOTE  09/19/2018  Surgeon:  Gaye Pollack, MD  First Assistant: Jadene Pierini,  PA-C   Preoperative Diagnosis:  Bicuspid aortic valve with 5.7 cm ascending aortic aneurysm and acute focal type A aortic  dissection   Postoperative Diagnosis:  Same   Procedure:  1. Median Sternotomy 2. Right axillary artery cannulation 3. Extracorporeal circulation via the right axillary artery and right atrium. 3.   Replacement of ascending aortic aneurysm using a 26 mm Hemashield graft ( Hemi-arch) under deep hypothermic circulatory arrest with antegrade cerebral perfusion. 4.   Bentall procedure using a 23 mm St. Jude mechanical valved conduit with reimplantation of both coronary arteries.  Anesthesia:  General Endotracheal Discharge Exam: Blood pressure 123/78, pulse (!) 115, temperature 98.7 F (37.1 C), temperature source Oral, resp. rate 18, height 5\' 7"  (1.702 m), weight 78.5 kg, SpO2 98 %.   General appearance: alert, cooperative and no distress Heart: regular rate and rhythm Lungs: clear to auscultation bilaterally Abdomen: soft, non-tender; bowel sounds normal; no masses,  no organomegaly Extremities: edema none appreciated Wound: clean and dry   Discharge disposition: 01-Home or Self Care  Discharge Instructions    Amb Referral to Cardiac Rehabilitation   Complete by:  As directed    Diagnosis:  Valve Replacement   Valve:  Aortic Comment - Bentall   Discharge patient   Complete by:  As directed    Discharge disposition:  01-Home or Self  Care   Discharge patient date:  09/27/2018     Allergies as of 09/27/2018   No Known Allergies     Medication List    STOP taking these medications   labetalol 100 MG tablet Commonly known as:  NORMODYNE     TAKE these medications   acetaminophen 325 MG tablet Commonly known as:  TYLENOL Take 650 mg by mouth every 6 (six) hours as needed (for pain.).   cetirizine 10 MG tablet Commonly known as:  ZYRTEC Take 10 mg by mouth daily as needed for allergies.   Metoprolol Tartrate 75 MG Tabs Take 75 mg by mouth 2 (two) times daily.   traMADol 50 MG tablet Commonly known as:  ULTRAM Take 1-2 tablets (50-100 mg total) by mouth every  6 (six) hours as needed for moderate pain.   warfarin 5 MG tablet Commonly known as:  COUMADIN Take 1 tablet (5 mg total) by mouth one time only at 6 PM. As directed by the coumadin clinic      Follow-up Information    Gaye Pollack, MD. Go on 10/26/2018.   Specialty:  Cardiothoracic Surgery Why:  You have been scheduled for a follow-up appointment to see Dr. Cyndia Bent on Wednesday June 3,2020 at [place] a chest x-ray at [facility] imaging 1/2-hour prior to this appointment at [facility] located in the same office complex. Contact information: [address] [place] [address] [place] [facility] Follow up on 09/29/2018.   Specialty:  Cardiology Why:  Appointment is at 10:30 for PT/INR check Contact information: [address] [place], [place], PA-C Follow up on 10/05/2018.   Specialties:  Physician Assistant, Cardiology Why:  Appointment is a virtual visit at [employer] information: [place] [id number] [phone]        [facility] [place] Follow up on 10/07/2018.   Specialty:  Cardiothoracic Surgery Why:  Appointment is at 1:00 pm to see Dr Cyndia Bent and have a wound check/suture removal Contact information: [place], [place] [phone]         The patient has been discharged on:   1.Beta Blocker:  Yes [ y  ]                              No   [   ]                              If No, reason:  2.Ace Inhibitor/ARB: Yes [   ]                                     No  [x    ]                                     If No, reason:normotensive  3.Statin:   Yes [   ]                  No  [ n  ]  If No, reason:not indicated for this patient at this time  4.S[place]:  Yes  [   ]                  No   [ n  ]                   If No, reason:on full dose coumadin for mechanical valve  Signed:  Erin Barrett PA-C 09/27/2018, 10:05 AM

## 2018-09-22 NOTE — Progress Notes (Signed)
3 Days Post-Op Procedure(s) (LRB): BENTALL PROCEDURE WITH 23 ST. JUDE GRAFT CONDUIT. (N/A) TRANSESOPHAGEAL ECHOCARDIOGRAM (TEE) (N/A) Subjective: Pain from chest tubes. Otherwise feels well. Ambulating well.  Objective: Vital signs in last 24 hours: Temp:  [98.2 F (36.8 C)-98.7 F (37.1 C)] 98.6 F (37 C) (04/30 0400) Pulse Rate:  [101-145] 136 (04/30 0700) Cardiac Rhythm: Sinus tachycardia (04/30 0400) Resp:  [15-28] 21 (04/30 0700) BP: (77-118)/(55-94) 106/76 (04/30 0700) SpO2:  [87 %-100 %] 100 % (04/30 0700) Weight:  [86 kg] 86 kg (04/30 0500)  Hemodynamic parameters for last 24 hours:    Intake/Output from previous day: 04/29 0701 - 04/30 0700 In: 524.4 [I.V.:424.4; IV Piggyback:100] Out: 1590 [Urine:1450; Chest Tube:140] Intake/Output this shift: No intake/output data recorded.  General appearance: alert and cooperative Neurologic: intact Heart: regular rate and rhythm and crisp mechanical valve click Lungs: clear to auscultation bilaterally Extremities: edema mild Wound: dressings dry  Lab Results: Recent Labs    09/21/18 0419 09/22/18 0436  WBC 14.3* 14.1*  HGB 8.7* 7.0*  HCT 25.6* 20.7*  PLT 142* 156   BMET:  Recent Labs    09/21/18 0419 09/22/18 0436  NA 134* 133*  K 4.0 3.9  CL 101 100  CO2 25 25  GLUCOSE 112* 108*  BUN 8 11  CREATININE 0.59* 0.64  CALCIUM 8.3* 8.2*    PT/INR:  Recent Labs    09/22/18 0436  LABPROT 13.8  INR 1.1   ABG    Component Value Date/Time   PHART 7.369 09/19/2018 2312   HCO3 20.9 09/19/2018 2312   TCO2 22 09/19/2018 2312   ACIDBASEDEF 4.0 (H) 09/19/2018 2312   O2SAT 99.0 09/19/2018 2312   CBG (last 3)  Recent Labs    09/20/18 2020 09/20/18 2357 09/21/18 0335  GLUCAP 115* 107* 98    Assessment/Plan: S/P Procedure(s) (LRB): BENTALL PROCEDURE WITH 23 ST. JUDE GRAFT CONDUIT. (N/A) TRANSESOPHAGEAL ECHOCARDIOGRAM (TEE) (N/A)  POD 3 Hemodynamically stable with sinus tachy 135 at rest. Likely due  to young age, pain, anemia. Continue Lopressor and increase dose as tolerated. BP is low normal so can't increase further yet.  Acute postop blood loss anemia: Hgb has trended down to 7.0 so will transfuse a unit of PRBC's. Continue iron.  Volume excess: Continue diuresis.  INR 1.1. Increase Coumadin to 5 mg.  DC MT's, sleeve. Keep double lumen I today.  CXR today  Ambulate, IS.   LOS: 6 days    Gaye Pollack 09/22/2018

## 2018-09-23 LAB — TYPE AND SCREEN
ABO/RH(D): O NEG
Antibody Screen: NEGATIVE
Unit division: 0
Unit division: 0
Unit division: 0
Unit division: 0

## 2018-09-23 LAB — BASIC METABOLIC PANEL
Anion gap: 10 (ref 5–15)
BUN: 8 mg/dL (ref 6–20)
CO2: 25 mmol/L (ref 22–32)
Calcium: 8.5 mg/dL — ABNORMAL LOW (ref 8.9–10.3)
Chloride: 95 mmol/L — ABNORMAL LOW (ref 98–111)
Creatinine, Ser: 0.57 mg/dL — ABNORMAL LOW (ref 0.61–1.24)
GFR calc Af Amer: >60 mL/min (ref >60)
GFR calc non Af Amer: >60 mL/min (ref >60)
Glucose, Bld: 111 mg/dL — ABNORMAL HIGH (ref 70–99)
Potassium: 3.6 mmol/L (ref 3.5–5.1)
Sodium: 130 mmol/L — ABNORMAL LOW (ref 135–145)

## 2018-09-23 LAB — BPAM RBC
Blood Product Expiration Date: 202005092359
Blood Product Expiration Date: 202005092359
Blood Product Expiration Date: 202005102359
Blood Product Expiration Date: 202005102359
ISSUE DATE / TIME: 202004270932
ISSUE DATE / TIME: 202004300833
Unit Type and Rh: 9500
Unit Type and Rh: 9500
Unit Type and Rh: 9500
Unit Type and Rh: 9500

## 2018-09-23 LAB — PROTIME-INR
INR: 1.1 (ref 0.8–1.2)
Prothrombin Time: 14.4 seconds (ref 11.4–15.2)

## 2018-09-23 MED ORDER — WARFARIN SODIUM 7.5 MG PO TABS
7.5000 mg | ORAL_TABLET | Freq: Every day | ORAL | Status: DC
  Administered 2018-09-23 – 2018-09-24 (×2): 7.5 mg via ORAL
  Filled 2018-09-23 (×2): qty 1

## 2018-09-23 MED ORDER — POTASSIUM CHLORIDE CRYS ER 20 MEQ PO TBCR
40.0000 meq | EXTENDED_RELEASE_TABLET | Freq: Two times a day (BID) | ORAL | Status: AC
  Administered 2018-09-23 – 2018-09-24 (×4): 40 meq via ORAL
  Filled 2018-09-23 (×4): qty 2

## 2018-09-23 MED ORDER — FUROSEMIDE 40 MG PO TABS
40.0000 mg | ORAL_TABLET | Freq: Every day | ORAL | Status: AC
  Administered 2018-09-23 – 2018-09-25 (×3): 40 mg via ORAL
  Filled 2018-09-23 (×3): qty 1

## 2018-09-23 MED ORDER — ASPIRIN EC 81 MG PO TBEC
81.0000 mg | DELAYED_RELEASE_TABLET | Freq: Every day | ORAL | Status: DC
  Administered 2018-09-23 – 2018-09-25 (×3): 81 mg via ORAL
  Filled 2018-09-23 (×3): qty 1

## 2018-09-23 MED ORDER — METOLAZONE 5 MG PO TABS
2.5000 mg | ORAL_TABLET | Freq: Every day | ORAL | Status: AC
  Administered 2018-09-23 – 2018-09-24 (×2): 2.5 mg via ORAL
  Filled 2018-09-23 (×2): qty 1

## 2018-09-23 MED ORDER — METOPROLOL TARTRATE 50 MG PO TABS
75.0000 mg | ORAL_TABLET | Freq: Two times a day (BID) | ORAL | Status: DC
  Administered 2018-09-23 – 2018-09-26 (×6): 75 mg via ORAL
  Filled 2018-09-23 (×6): qty 1

## 2018-09-23 MED ORDER — DM-GUAIFENESIN ER 30-600 MG PO TB12
1.0000 | ORAL_TABLET | Freq: Two times a day (BID) | ORAL | Status: DC
  Administered 2018-09-23 – 2018-09-27 (×9): 1 via ORAL
  Filled 2018-09-23 (×10): qty 1

## 2018-09-23 NOTE — Progress Notes (Signed)
Patient standing to void and be weigh. H.R. up 140's S.T. Patient wanting to set in chair H.R. down 128 S.T.

## 2018-09-23 NOTE — Progress Notes (Signed)
Cardiac Rehab Advisory Cardiac Rehab Phase I is not seeing pts face to face at this time due to Covid 19 restrictions. Ambulation is occurring through nursing, PT, and mobility teams. We will help facilitate that process as needed. We are calling pts in their rooms and discussing education. We will then deliver education materials to pts RN for delivery to pt.   Spoke with pt by phone. He sts he has not ambulated in hall today due to med schedule but he will soon. He will try to go without RW but knows that it is there if he needs it. We discussed how he can watch his HR on telemetry. He has been moving around the room independently. Yesterday he walked 5-6 laps numerous times on 2H. He had not questions regarding moving "in the tube". He is performing 2000 mL on IS and sitting in recliner for meals. No further questions. We will call again for d/c education when closer to d/c. [id number] [name] CES, ACSM 12:31 PM 09/23/2018

## 2018-09-23 NOTE — Progress Notes (Addendum)
Patient has been setting in chair talking on phone and playing video games. H.R> staying up 130-140' S.T. assist patietn back to bed now and enc. Him again to relax H.R. coming down to 123-126.

## 2018-09-23 NOTE — Progress Notes (Signed)
Epicardial pacing wires removed, per order. Wire ends intact. Sites clean and dry. Vitals stable and being monitored every 15 minutes, per protocol. Pt on bedrest x1 hour. Call light within reach. Will continue to monitor.

## 2018-09-23 NOTE — Progress Notes (Signed)
4 Days Post-Op Procedure(s) (LRB): BENTALL PROCEDURE WITH 23 ST. JUDE GRAFT CONDUIT. (N/A) TRANSESOPHAGEAL ECHOCARDIOGRAM (TEE) (N/A) Subjective: Some cough but overall feels well. Wants to take a shower.  Objective: Vital signs in last 24 hours: Temp:  [98.2 F (36.8 C)-99.4 F (37.4 C)] 99.2 F (37.3 C) (05/01 0807) Pulse Rate:  [87-148] 123 (05/01 0807) Cardiac Rhythm: Sinus tachycardia (04/30 1901) Resp:  [10-29] 20 (05/01 0807) BP: (99-124)/(52-80) 118/71 (05/01 0807) SpO2:  [90 %-100 %] 99 % (05/01 0807) Weight:  [85.5 kg] 85.5 kg (05/01 0512)  Hemodynamic parameters for last 24 hours:    Intake/Output from previous day: 04/30 0701 - 05/01 0700 In: 1371 [P.O.:480; I.V.:576; Blood:315] Out: 2025 [Urine:2025] Intake/Output this shift: Total I/O In: 13 [I.V.:13] Out: -   General appearance: alert and cooperative Neurologic: intact Heart: regular rate and rhythm and crisp mechanical valve click Lungs: clear to auscultation bilaterally Abdomen: soft, non-tender; bowel sounds normal; no masses,  no organomegaly Extremities: edema mild Wound: incisions ok  Lab Results: Recent Labs    09/22/18 0436 09/23/18 0251  WBC 14.1* 15.3*  HGB 7.0* 8.0*  HCT 20.7* 23.4*  PLT 156 216   BMET:  Recent Labs    09/22/18 0436 09/23/18 0251  NA 133* 130*  K 3.9 3.6  CL 100 95*  CO2 25 25  GLUCOSE 108* 111*  BUN 11 8  CREATININE 0.64 0.57*  CALCIUM 8.2* 8.5*    PT/INR:  Recent Labs    09/23/18 0251  LABPROT 14.4  INR 1.1   ABG    Component Value Date/Time   PHART 7.369 09/19/2018 2312   HCO3 20.9 09/19/2018 2312   TCO2 22 09/19/2018 2312   ACIDBASEDEF 4.0 (H) 09/19/2018 2312   O2SAT 99.0 09/19/2018 2312   CBG (last 3)  Recent Labs    09/21/18 0335 09/22/18 0750 09/22/18 1157  GLUCAP 98 100* 122*    Assessment/Plan: S/P Procedure(s) (LRB): BENTALL PROCEDURE WITH 23 ST. JUDE GRAFT CONDUIT. (N/A) TRANSESOPHAGEAL ECHOCARDIOGRAM (TEE) (N/A)  POD  4 Hemodynamically stable. Still has resting sinus tachycardia on Lopressor 50 bid but probably due to young age, anemia, pain. BP is ok so will increase to 75 bid.  Anemia: Hgb improved to 8 after transfusion yesterday. Continue iron.  Volume excess: wt is still 9-10 lbs over preop. Will give him lasix on Metolazone for 2 days to diurese further. Would like to get him back to preop wt.  INR has not bumped yet. Will give 7.5 Coumadin today.  DC pacing wires and central line.  He can shower tomorrow.  Home once INR 2.     LOS: 7 days    Gaye Pollack 09/23/2018

## 2018-09-24 LAB — CBC
HCT: 23.4 % — ABNORMAL LOW (ref 39.0–52.0)
Hemoglobin: 8 g/dL — ABNORMAL LOW (ref 13.0–17.0)
MCH: 33.1 pg (ref 26.0–34.0)
MCHC: 34.2 g/dL (ref 30.0–36.0)
MCV: 96.7 fL (ref 80.0–100.0)
Platelets: 216 10*3/uL (ref 150–400)
RBC: 2.42 MIL/uL — ABNORMAL LOW (ref 4.22–5.81)
RDW: 16.2 % — ABNORMAL HIGH (ref 11.5–15.5)
WBC: 15.3 10*3/uL — ABNORMAL HIGH (ref 4.0–10.5)
nRBC: 0.1 % (ref 0.0–0.2)

## 2018-09-24 LAB — PROTIME-INR
INR: 1.4 — ABNORMAL HIGH (ref 0.8–1.2)
Prothrombin Time: 17.3 seconds — ABNORMAL HIGH (ref 11.4–15.2)

## 2018-09-24 NOTE — Progress Notes (Signed)
Pt ambulated x 4 laps around the unit tolerated well except HR  110's- 120"s while laying in bed, then 140's while ambulating pt states he cant tell  Gave his night dose of lopressor and 2 hours later pt was 100's to 110's laying in bed

## 2018-09-24 NOTE — Progress Notes (Addendum)
      [address]       ,[place] [address]             [phone]      5 Days Post-Op Procedure(s) (LRB): BENTALL PROCEDURE WITH 23 ST. JUDE GRAFT CONDUIT. (N/A) TRANSESOPHAGEAL ECHOCARDIOGRAM (TEE) (N/A)   Subjective:  No new complaints.  Doing pretty good.  Objective: Vital signs in last 24 hours: Temp:  [98.1 F (36.7 C)-99.5 F (37.5 C)] 99.5 F (37.5 C) (05/02 0435) Pulse Rate:  [107-132] 113 (05/02 0025) Cardiac Rhythm: Sinus tachycardia (05/01 1901) Resp:  [15-20] 20 (05/02 0435) BP: (98-120)/(57-88) 98/57 (05/02 0435) SpO2:  [95 %-100 %] 100 % (05/02 0435) Weight:  [83.4 kg] 83.4 kg (05/02 0435)  Intake/Output from previous day: 05/01 0701 - 05/02 0700 In: 873 [P.O.:860; I.V.:13] Out: 1650 [Urine:1650]  General appearance: alert, cooperative and no distress Heart: regular rate and rhythm and + valve click, tachycardia Lungs: clear to auscultation bilaterally Abdomen: soft, non-tender; bowel sounds normal; no masses,  no organomegaly Extremities: edema trace Wound: clean and dry  Lab Results: Recent Labs    09/22/18 0436 09/23/18 0251  WBC 14.1* 15.3*  HGB 7.0* 8.0*  HCT 20.7* 23.4*  PLT 156 216   BMET:  Recent Labs    09/22/18 0436 09/23/18 0251  NA 133* 130*  K 3.9 3.6  CL 100 95*  CO2 25 25  GLUCOSE 108* 111*  BUN 11 8  CREATININE 0.64 0.57*  CALCIUM 8.2* 8.5*    PT/INR:  Recent Labs    09/24/18 0208  LABPROT 17.3*  INR 1.4*   ABG    Component Value Date/Time   PHART 7.369 09/19/2018 2312   HCO3 20.9 09/19/2018 2312   TCO2 22 09/19/2018 2312   ACIDBASEDEF 4.0 (H) 09/19/2018 2312   O2SAT 99.0 09/19/2018 2312   CBG (last 3)  Recent Labs    09/22/18 0750 09/22/18 1157  GLUCAP 100* 122*    Assessment/Plan: S/P Procedure(s) (LRB): BENTALL PROCEDURE WITH 23 ST. JUDE GRAFT CONDUIT. (N/A) TRANSESOPHAGEAL ECHOCARDIOGRAM (TEE) (N/A)  1. CV- Sinus Tachycardia, BP labile in the 90s- continue Lopressor at 75  mg BID 2. INR 1.4, repeat coumadin at 7.5 mg daily 3. Pulm- no acute issues, continue IS 4. Renal- creatinine WNL, K is borderline at 3.6, weight is improved, will supplement K with Lasix and Zaroxolyn use 5. Dispo- patient stable, INR at 1.4 continue coumadin, treat K, patient is ready for d/c once INR is 2   LOS: 8 days    Ellwood Handler 09/24/2018   Lab Results  Component Value Date   INR 1.4 (H) 09/24/2018   INR 1.1 09/23/2018   INR 1.1 09/22/2018   Ambulating in hall , hr 106-110   I have seen and examined Henry Duncan and agree with the above assessment  and plan.  Grace Isaac MD [place] [phone] Office [phone] 09/24/2018 11:19 AM

## 2018-09-25 LAB — PROTIME-INR
INR: 1.9 — ABNORMAL HIGH (ref 0.8–1.2)
Prothrombin Time: 21.4 seconds — ABNORMAL HIGH (ref 11.4–15.2)

## 2018-09-25 MED ORDER — WARFARIN SODIUM 2.5 MG PO TABS
2.5000 mg | ORAL_TABLET | Freq: Once | ORAL | Status: AC
  Administered 2018-09-25: 2.5 mg via ORAL
  Filled 2018-09-25: qty 1

## 2018-09-25 NOTE — Progress Notes (Signed)
Patient ID: Henry Duncan, male   DOB: 02/11/1991, 28 y.o.   MRN: [id number]      [name]       [place] [address]             [phone]                 6 Days Post-Op Procedure(s) (LRB): BENTALL PROCEDURE WITH 23 ST. JUDE GRAFT CONDUIT. (N/A) TRANSESOPHAGEAL ECHOCARDIOGRAM (TEE) (N/A)  LOS: 9 days   Subjective: Feels ok today, walked in hall,   Objective: Vital signs in last 24 hours: Patient Vitals for the past 24 hrs:  BP Temp Temp src Pulse Resp SpO2 Weight  09/25/18 0836 (!) 127/93 98.9 F (37.2 C) Oral (!) 127 20 96 % -  09/25/18 0626 - - - - (!) 28 - 80.9 kg  09/25/18 0600 - - - - (!) 24 - -  09/25/18 0500 - - - - (!) 22 - -  09/25/18 0400 - - - - (!) 27 - -  09/25/18 0300 - - - - 18 - -  09/25/18 0240 121/82 98.4 F (36.9 C) Oral (!) 118 (!) 22 95 % -  09/25/18 0100 - - - - (!) 23 - -  09/25/18 0000 - - - - 19 - -  09/24/18 2300 - - - - (!) 21 - -  09/24/18 2250 113/74 97.7 F (36.5 C) Oral (!) 112 18 97 % -  09/24/18 1925 119/82 98.1 F (36.7 C) Oral (!) 117 (!) 30 98 % -  09/24/18 1647 - 99.2 F (37.3 C) Oral - - - -  09/24/18 1645 109/77 - - - (!) 25 - -    Filed Weights   09/23/18 0512 09/24/18 0435 09/25/18 0626  Weight: 85.5 kg 83.4 kg 80.9 kg    Hemodynamic parameters for last 24 hours:    Intake/Output from previous day: 05/02 0701 - 05/03 0700 In: 240 [P.O.:240] Out: 1700 [Urine:1700] Intake/Output this shift: Total I/O In: -  Out: 300 [Urine:300]  Scheduled Meds: . aspirin EC  81 mg Oral Daily  . dextromethorphan-guaiFENesin  1 tablet Oral BID  . ferrous [id number] C-folic acid  1 capsule Oral BID PC  . metoprolol tartrate  75 mg Oral BID  . pantoprazole  40 mg Oral QAC breakfast  . sodium chloride flush  3 mL Intravenous Q12H  . warfarin  7.5 mg Oral q1800  . Warfarin - Physician Dosing Inpatient   Does not apply q1800   Continuous Infusions: . sodium chloride     PRN Meds:.sodium  chloride, acetaminophen, ketorolac, ondansetron **OR** ondansetron (ZOFRAN) IV, oxyCODONE, sodium chloride flush, traMADol  General appearance: alert, cooperative and no distress Neurologic: intact Heart: regular rate and rhythm, S1, S2 normal, no murmur, click, rub or gallop Lungs: clear to auscultation bilaterally Abdomen: soft, non-tender; bowel sounds normal; no masses,  no organomegaly Extremities: extremities normal, atraumatic, no cyanosis or edema and Homans sign is negative, no sign of DVT  Lab Results: CBC: Recent Labs    09/23/18 0251  WBC 15.3*  HGB 8.0*  HCT 23.4*  PLT 216   BMET:  Recent Labs    09/23/18 0251  NA 130*  K 3.6  CL 95*  CO2 25  GLUCOSE 111*  BUN 8  CREATININE 0.57*  CALCIUM 8.5*    PT/INR:  Recent Labs    09/25/18 0257  LABPROT 21.4*  INR 1.9*   Lab Results  Component Value Date  INR 1.9 (H) 09/25/2018   INR 1.4 (H) 09/24/2018   INR 1.1 09/23/2018  coumadin dose 2.5, 2.5,5.0,7.5,7.5   Radiology No results found.   Assessment/Plan: S/P Procedure(s) (LRB): BENTALL PROCEDURE WITH 23 ST. JUDE GRAFT CONDUIT. (N/A) TRANSESOPHAGEAL ECHOCARDIOGRAM (TEE) (N/A) Sinus tachycardia improved , continue same dose of beta blocker  Poss home tomorrow depending on inr   Grace Isaac MD 09/25/2018 11:53 AM

## 2018-09-25 NOTE — Progress Notes (Signed)
Patient ambulated in hallway twice once with walker and once without. Patient made 3 laps around unit each time. First ambulation heart remained 100's and second ambulation briefly increased to 120's. Patient states he does not feel like heart rate is beating any faster. Pt resting with call bell within reach.  Will continue to monitor. Payton Emerald, RN

## 2018-09-26 ENCOUNTER — Inpatient Hospital Stay (HOSPITAL_COMMUNITY): Payer: BLUE CROSS/BLUE SHIELD | Admitting: Anesthesiology

## 2018-09-26 ENCOUNTER — Encounter (HOSPITAL_COMMUNITY)
Admission: AD | Disposition: A | Discharge status: Home / Self Care | Payer: Self-pay | Source: Ambulatory Visit | Attending: Surgery

## 2018-09-26 DIAGNOSIS — I313 Pericardial effusion (noninflammatory): Secondary | ICD-10-CM

## 2018-09-26 HISTORY — PX: SUBXYPHOID PERICARDIAL WINDOW: SHX5075

## 2018-09-26 LAB — CBC
HCT: 29.1 % — ABNORMAL LOW (ref 39.0–52.0)
Hemoglobin: 9.8 g/dL — ABNORMAL LOW (ref 13.0–17.0)
MCH: 32.3 pg (ref 26.0–34.0)
MCHC: 33.7 g/dL (ref 30.0–36.0)
MCV: 96 fL (ref 80.0–100.0)
Platelets: 487 10*3/uL — ABNORMAL HIGH (ref 150–400)
RBC: 3.03 MIL/uL — ABNORMAL LOW (ref 4.22–5.81)
RDW: 14.3 % (ref 11.5–15.5)
WBC: 13.9 10*3/uL — ABNORMAL HIGH (ref 4.0–10.5)
nRBC: 0.1 % (ref 0.0–0.2)

## 2018-09-26 LAB — BASIC METABOLIC PANEL
Anion gap: 16 — ABNORMAL HIGH (ref 5–15)
BUN: 10 mg/dL (ref 6–20)
CO2: 34 mmol/L — ABNORMAL HIGH (ref 22–32)
Calcium: 9.4 mg/dL (ref 8.9–10.3)
Chloride: 83 mmol/L — ABNORMAL LOW (ref 98–111)
Creatinine, Ser: 0.82 mg/dL (ref 0.61–1.24)
GFR calc Af Amer: >60 mL/min (ref >60)
GFR calc non Af Amer: >60 mL/min (ref >60)
Glucose, Bld: 110 mg/dL — ABNORMAL HIGH (ref 70–99)
Potassium: 2.7 mmol/L — CL (ref 3.5–5.1)
Sodium: 133 mmol/L — ABNORMAL LOW (ref 135–145)

## 2018-09-26 LAB — PROTIME-INR
INR: 1.9 — ABNORMAL HIGH (ref 0.8–1.2)
Prothrombin Time: 21.4 seconds — ABNORMAL HIGH (ref 11.4–15.2)

## 2018-09-26 SURGERY — CREATION, PERICARDIAL WINDOW, SUBXIPHOID APPROACH
Anesthesia: General | Site: Chest

## 2018-09-26 MED ORDER — METOPROLOL TARTRATE 75 MG PO TABS: 75.0000 mg | ORAL_TABLET | Freq: Two times a day (BID) | ORAL | 3 refills | Status: DC

## 2018-09-26 MED ORDER — LACTATED RINGERS IV SOLN
INTRAVENOUS | Status: DC | PRN
  Administered 2018-09-26: 14:00:00 via INTRAVENOUS

## 2018-09-26 MED ORDER — HYDROMORPHONE HCL 1 MG/ML IJ SOLN
INTRAMUSCULAR | Status: AC
  Filled 2018-09-26: qty 1

## 2018-09-26 MED ORDER — TRAMADOL HCL 50 MG PO TABS: 50.0000 mg | ORAL_TABLET | ORAL | 0 refills | Status: DC | PRN

## 2018-09-26 MED ORDER — HYDROMORPHONE HCL 1 MG/ML IJ SOLN
0.2500 mg | INTRAMUSCULAR | Status: DC | PRN
  Administered 2018-09-26 (×2): 0.25 mg via INTRAVENOUS

## 2018-09-26 MED ORDER — SUGAMMADEX SODIUM 200 MG/2ML IV SOLN
INTRAVENOUS | Status: DC | PRN
  Administered 2018-09-26: 180 mg via INTRAVENOUS

## 2018-09-26 MED ORDER — LACTATED RINGERS IV SOLN
INTRAVENOUS | Status: DC
  Administered 2018-09-26: 14:00:00 via INTRAVENOUS

## 2018-09-26 MED ORDER — PHENYLEPHRINE 40 MCG/ML (10ML) SYRINGE FOR IV PUSH (FOR BLOOD PRESSURE SUPPORT)
PREFILLED_SYRINGE | INTRAVENOUS | Status: DC | PRN
  Administered 2018-09-26 (×2): 80 ug via INTRAVENOUS

## 2018-09-26 MED ORDER — WARFARIN SODIUM 5 MG PO TABS
5.0000 mg | ORAL_TABLET | Freq: Once | ORAL | Status: AC
  Administered 2018-09-26: 5 mg via ORAL
  Filled 2018-09-26: qty 1

## 2018-09-26 MED ORDER — ALBUTEROL SULFATE HFA 108 (90 BASE) MCG/ACT IN AERS
INHALATION_SPRAY | RESPIRATORY_TRACT | Status: DC | PRN
  Administered 2018-09-26: 2 via RESPIRATORY_TRACT
  Administered 2018-09-26: 8 via RESPIRATORY_TRACT

## 2018-09-26 MED ORDER — SODIUM CHLORIDE 0.9 % IV SOLN
INTRAVENOUS | Status: DC | PRN
  Administered 2018-09-26: 30 ug/min via INTRAVENOUS

## 2018-09-26 MED ORDER — METOPROLOL TARTRATE 50 MG PO TABS
50.0000 mg | ORAL_TABLET | Freq: Two times a day (BID) | ORAL | Status: DC
  Administered 2018-09-26 – 2018-09-27 (×2): 50 mg via ORAL
  Filled 2018-09-26 (×2): qty 1

## 2018-09-26 MED ORDER — ONDANSETRON HCL 4 MG/2ML IJ SOLN
INTRAMUSCULAR | Status: AC
  Filled 2018-09-26: qty 2

## 2018-09-26 MED ORDER — 0.9 % SODIUM CHLORIDE (POUR BTL) OPTIME
TOPICAL | Status: DC | PRN
  Administered 2018-09-26: 15:00:00 2000 mL

## 2018-09-26 MED ORDER — ROCURONIUM BROMIDE 10 MG/ML (PF) SYRINGE
PREFILLED_SYRINGE | INTRAVENOUS | Status: DC | PRN
  Administered 2018-09-26: 50 mg via INTRAVENOUS

## 2018-09-26 MED ORDER — DEXAMETHASONE SODIUM PHOSPHATE 10 MG/ML IJ SOLN
INTRAMUSCULAR | Status: AC
  Filled 2018-09-26: qty 1

## 2018-09-26 MED ORDER — LIDOCAINE 2% (20 MG/ML) 5 ML SYRINGE
INTRAMUSCULAR | Status: AC
  Filled 2018-09-26: qty 5

## 2018-09-26 MED ORDER — PROPOFOL 10 MG/ML IV BOLUS
INTRAVENOUS | Status: AC
  Filled 2018-09-26: qty 20

## 2018-09-26 MED ORDER — PROPOFOL 10 MG/ML IV BOLUS
INTRAVENOUS | Status: DC | PRN
  Administered 2018-09-26: 140 mg via INTRAVENOUS

## 2018-09-26 MED ORDER — POTASSIUM CHLORIDE CRYS ER 20 MEQ PO TBCR
40.0000 meq | EXTENDED_RELEASE_TABLET | Freq: Two times a day (BID) | ORAL | Status: AC
  Administered 2018-09-26 (×2): 40 meq via ORAL
  Filled 2018-09-26 (×2): qty 2

## 2018-09-26 MED ORDER — ONDANSETRON HCL 4 MG/2ML IJ SOLN
INTRAMUSCULAR | Status: DC | PRN
  Administered 2018-09-26: 4 mg via INTRAVENOUS

## 2018-09-26 MED ORDER — CEFAZOLIN SODIUM-DEXTROSE 2-3 GM-%(50ML) IV SOLR
INTRAVENOUS | Status: DC | PRN
  Administered 2018-09-26: 2 g via INTRAVENOUS

## 2018-09-26 MED ORDER — PROMETHAZINE HCL 25 MG/ML IJ SOLN: 6.2500 mg | INTRAMUSCULAR | Status: DC | PRN

## 2018-09-26 MED ORDER — MIDAZOLAM HCL 2 MG/2ML IJ SOLN
INTRAMUSCULAR | Status: AC
  Filled 2018-09-26: qty 2

## 2018-09-26 MED ORDER — CEFAZOLIN SODIUM-DEXTROSE 1-4 GM/50ML-% IV SOLN
1.0000 g | Freq: Three times a day (TID) | INTRAVENOUS | Status: AC
  Administered 2018-09-26 – 2018-09-27 (×2): 1 g via INTRAVENOUS
  Filled 2018-09-26 (×2): qty 50

## 2018-09-26 MED ORDER — FENTANYL CITRATE (PF) 250 MCG/5ML IJ SOLN
INTRAMUSCULAR | Status: DC | PRN
  Administered 2018-09-26: 50 ug via INTRAVENOUS
  Administered 2018-09-26: 100 ug via INTRAVENOUS

## 2018-09-26 MED ORDER — SUCCINYLCHOLINE CHLORIDE 20 MG/ML IJ SOLN
INTRAMUSCULAR | Status: DC | PRN
  Administered 2018-09-26: 120 mg via INTRAVENOUS

## 2018-09-26 MED ORDER — FENTANYL CITRATE (PF) 250 MCG/5ML IJ SOLN
INTRAMUSCULAR | Status: AC
  Filled 2018-09-26: qty 5

## 2018-09-26 MED ORDER — POTASSIUM CHLORIDE CRYS ER 20 MEQ PO TBCR: 40.0000 meq | EXTENDED_RELEASE_TABLET | Freq: Every day | ORAL | 0 refills | Status: DC

## 2018-09-26 MED ORDER — DEXAMETHASONE SODIUM PHOSPHATE 10 MG/ML IJ SOLN
INTRAMUSCULAR | Status: DC | PRN
  Administered 2018-09-26: 10 mg via INTRAVENOUS

## 2018-09-26 MED ORDER — LIDOCAINE 2% (20 MG/ML) 5 ML SYRINGE
INTRAMUSCULAR | Status: DC | PRN
  Administered 2018-09-26: 100 mg via INTRAVENOUS

## 2018-09-26 MED ORDER — MIDAZOLAM HCL 5 MG/5ML IJ SOLN
INTRAMUSCULAR | Status: DC | PRN
  Administered 2018-09-26: 2 mg via INTRAVENOUS

## 2018-09-26 MED ORDER — WARFARIN SODIUM 5 MG PO TABS: 5.0000 mg | ORAL_TABLET | Freq: Once | ORAL | 3 refills | Status: DC

## 2018-09-26 SURGICAL SUPPLY — 54 items
ADH SKN CLS APL DERMABOND .7 (GAUZE/BANDAGES/DRESSINGS)
BLADE CLIPPER SURG (BLADE) ×1 IMPLANT
CANISTER SUCT 3000ML PPV (MISCELLANEOUS) ×2 IMPLANT
CATH THORACIC 28FR (CATHETERS) IMPLANT
CATH THORACIC 28FR RT ANG (CATHETERS) IMPLANT
CATH THORACIC 36FR (CATHETERS) IMPLANT
CATH THORACIC 36FR RT ANG (CATHETERS) IMPLANT
CONT SPEC 4OZ CLIKSEAL STRL BL (MISCELLANEOUS) ×2 IMPLANT
COVER WAND RF STERILE (DRAPES) ×1 IMPLANT
DERMABOND ADVANCED (GAUZE/BANDAGES/DRESSINGS)
DERMABOND ADVANCED .7 DNX12 (GAUZE/BANDAGES/DRESSINGS) IMPLANT
DRAIN CHANNEL 28F RND 3/8 FF (WOUND CARE) IMPLANT
DRAPE CARDIOVASCULAR INCISE (DRAPES) ×2
DRAPE INCISE IOBAN 66X45 STRL (DRAPES) ×2 IMPLANT
DRAPE LAPAROSCOPIC ABDOMINAL (DRAPES) ×2 IMPLANT
DRAPE SLUSH/WARMER DISC (DRAPES) ×2 IMPLANT
DRAPE SRG 135X102X78XABS (DRAPES) IMPLANT
DRAPE SURG 17X23 STRL (DRAPES) ×1 IMPLANT
DRSG COVADERM 4X6 (GAUZE/BANDAGES/DRESSINGS) ×1 IMPLANT
ELECT BLADE 4.0 EZ CLEAN MEGAD (MISCELLANEOUS) ×2
ELECT CAUTERY BLADE 6.4 (BLADE) ×1 IMPLANT
ELECT REM PT RETURN 9FT ADLT (ELECTROSURGICAL) ×2
ELECTRODE BLDE 4.0 EZ CLN MEGD (MISCELLANEOUS) ×1 IMPLANT
ELECTRODE REM PT RTRN 9FT ADLT (ELECTROSURGICAL) ×1 IMPLANT
GAUZE SPONGE 4X4 12PLY STRL (GAUZE/BANDAGES/DRESSINGS) ×2 IMPLANT
GLOVE EUDERMIC 7 POWDERFREE (GLOVE) ×2 IMPLANT
GOWN STRL REUS W/ TWL LRG LVL3 (GOWN DISPOSABLE) ×1 IMPLANT
GOWN STRL REUS W/ TWL XL LVL3 (GOWN DISPOSABLE) ×1 IMPLANT
GOWN STRL REUS W/TWL LRG LVL3 (GOWN DISPOSABLE) ×2
GOWN STRL REUS W/TWL XL LVL3 (GOWN DISPOSABLE) ×2
KIT BASIN OR (CUSTOM PROCEDURE TRAY) ×2 IMPLANT
KIT TURNOVER KIT B (KITS) ×2 IMPLANT
NS IRRIG 1000ML POUR BTL (IV SOLUTION) ×3 IMPLANT
PACK CHEST (CUSTOM PROCEDURE TRAY) ×2 IMPLANT
PAD ARMBOARD 7.5X6 YLW CONV (MISCELLANEOUS) ×4 IMPLANT
PAD ELECT DEFIB RADIOL ZOLL (MISCELLANEOUS) ×2 IMPLANT
SLEEVE SCD COMPRESS KNEE MED (MISCELLANEOUS) ×2 IMPLANT
SUT ETHILON 3 0 FSL (SUTURE) ×2 IMPLANT
SUT VIC AB 0 CT1 18XCR BRD 8 (SUTURE) ×1 IMPLANT
SUT VIC AB 0 CT1 8-18 (SUTURE) ×2
SUT VIC AB 1 CTX 18 (SUTURE) IMPLANT
SUT VIC AB 2-0 CT1 27 (SUTURE) ×2
SUT VIC AB 2-0 CT1 TAPERPNT 27 (SUTURE) ×1 IMPLANT
SUT VIC AB 3-0 X1 27 (SUTURE) ×2 IMPLANT
SWAB COLLECTION DEVICE MRSA (MISCELLANEOUS) IMPLANT
SWAB CULTURE ESWAB REG 1ML (MISCELLANEOUS) IMPLANT
SYR 10ML LL (SYRINGE) IMPLANT
SYR 50ML SLIP (SYRINGE) IMPLANT
SYSTEM SAHARA CHEST DRAIN ATS (WOUND CARE) IMPLANT
TOWEL GREEN STERILE (TOWEL DISPOSABLE) ×2 IMPLANT
TOWEL GREEN STERILE FF (TOWEL DISPOSABLE) ×2 IMPLANT
TRAP SPECIMEN MUCOUS 40CC (MISCELLANEOUS) ×3 IMPLANT
TRAY FOLEY SLVR 16FR TEMP STAT (SET/KITS/TRAYS/PACK) ×2 IMPLANT
WATER STERILE IRR 1000ML POUR (IV SOLUTION) ×4 IMPLANT

## 2018-09-26 NOTE — Discharge Instructions (Signed)
Please remove chest tube sutures in 1 week.  Lift knot and cut suture on either side of the knot.   Discharge Instructions:  1. You may shower, please wash incisions daily with soap and water and keep dry.  If you wish to cover wounds with dressing you may do so but please keep clean and change daily.  No tub baths or swimming until incisions have completely healed.  If your incisions become red or develop any drainage please call our office at [phone]  2. No Driving until cleared by Dr. Vivi Martens office and you are no longer using narcotic pain medications  3. Monitor your weight daily.. Please use the same scale and weigh at same time... If you gain 5-10 lbs in 48 hours with associated lower extremity swelling, please contact our office at [phone]  4. Fever of 101.5 for at least 24 hours with no source, please contact our office at [phone]  5. Activity- up as tolerated, please walk at least 3 times per day.  Avoid strenuous activity, no lifting, pushing, or pulling with your arms over 8-10 lbs for a minimum of 6 weeks  6. If any questions or concerns arise, please do not hesitate to contact our office at [phone]

## 2018-09-26 NOTE — Progress Notes (Addendum)
      [address]       [place] [id number]             [phone]      7 Days Post-Op Procedure(s) (LRB): BENTALL PROCEDURE WITH 23 ST. JUDE GRAFT CONDUIT. (N/A) TRANSESOPHAGEAL ECHOCARDIOGRAM (TEE) (N/A)   Subjective:  No new complaints.  Feels good is hoping to go home.  + ambulation  + BM  Objective: Vital signs in last 24 hours: Temp:  [98.2 F (36.8 C)-99.1 F (37.3 C)] 98.2 F (36.8 C) (05/04 0340) Pulse Rate:  [99-127] 99 (05/04 0340) Cardiac Rhythm: Normal sinus rhythm;Sinus tachycardia (05/04 0340) Resp:  [20-33] 23 (05/04 0340) BP: (103-127)/(70-93) 106/80 (05/04 0340) SpO2:  [92 %-99 %] 92 % (05/04 0340) Weight:  [78.8 kg] 78.8 kg (05/04 0340)  Intake/Output from previous day: 05/03 0701 - 05/04 0700 In: 240 [P.O.:240] Out: 1975 [Urine:1975]  General appearance: alert, cooperative and no distress Heart: regular rate and rhythm Lungs: clear to auscultation bilaterally Abdomen: soft, non-tender; bowel sounds normal; no masses,  no organomegaly Extremities: edema none appreciated Wound: clean and dry  Lab Results: Recent Labs    09/26/18 0408  WBC 13.9*  HGB 9.8*  HCT 29.1*  PLT 487*   BMET:  Recent Labs    09/26/18 0408  NA 133*  K 2.7*  CL 83*  CO2 34*  GLUCOSE 110*  BUN 10  CREATININE 0.82  CALCIUM 9.4    PT/INR:  Recent Labs    09/26/18 0408  LABPROT 21.4*  INR 1.9*   ABG    Component Value Date/Time   PHART 7.369 09/19/2018 2312   HCO3 20.9 09/19/2018 2312   TCO2 22 09/19/2018 2312   ACIDBASEDEF 4.0 (H) 09/19/2018 2312   O2SAT 99.0 09/19/2018 2312   CBG (last 3)  No results for input(s): GLUCAP in the last 72 hours.  Assessment/Plan: S/P Procedure(s) (LRB): BENTALL PROCEDURE WITH 23 ST. JUDE GRAFT CONDUIT. (N/A) TRANSESOPHAGEAL ECHOCARDIOGRAM (TEE) (N/A)  1. CV- Sinus Tach, HR is stable, BP is on the low side in the 90s- continue Lopressor at 75 mg BID 2. INR 1.9, will give coumadin 5 mg daily,  with repeat INR Thursday  3. Pulm- no acute issues, continue IS 4. Renal- creatinine weight is at baseline 5. Hypokalemia, K is 2.7, supplementation ordered 6. Dispo- patient stable, will d/c home today   LOS: 10 days    Ellwood Handler 09/26/2018   Chart reviewed, patient examined, agree with above. He feels well.  INR up to 1.9 so I think he can go home today on Coumadin 5 mg daily with INR check Thursday. Goal is 2.5-3.  HR still sinus tachy on Lopressor 75 bid. I think this is due to young age and anemia and will resolve over the next few weeks. His Hgb is improved to 9.8. Plan home today.

## 2018-09-26 NOTE — Progress Notes (Signed)
Cardiac Rehab Advisory Cardiac Rehab Phase I is not seeing pts face to face at this time due to Covid 19 restrictions. Ambulation is occurring through nursing, PT, and mobility teams. We will help facilitate that process as needed. We are calling pts in their rooms and discussing education. We will then deliver education materials to pts RN for delivery to pt.   [id number] Called pt to complete ed. Reviewed incision care, sternal precautions, ex ed with walking instructions given, gave heart healthy diet for information. Pt stated he had seen pharmacist and been counseled on Coumadin. Discussed CRP 2 and referred to [facility] CRP 2. Pt stated he had been told about discharge video. No questions re ed done and pt voiced understanding. Has been walking without walker for a few days. Materials given to staff to give to pt as pt stated going home soon. Graylon Good RN BSN 09/26/2018 8:30 AM

## 2018-09-26 NOTE — Progress Notes (Signed)
Pt received from PACU. VSS. Telemetry applied. Gauze drsg on midline incision w/ quarter sized amnt of serosanguineous drainage. Will continues to monitor.  Clyde Canterbury, RN

## 2018-09-26 NOTE — Anesthesia Preprocedure Evaluation (Signed)
Anesthesia Evaluation  Patient identified by MRN, date of birth, ID band Patient awake    Reviewed: Allergy & Precautions, NPO status , Patient's Chart, lab work & pertinent test results  Airway Mallampati: II  TM Distance: >3 FB Neck ROM: Full    Dental no notable dental hx.    Pulmonary neg pulmonary ROS,    Pulmonary exam normal breath sounds clear to auscultation       Cardiovascular Normal cardiovascular exam Rhythm:Regular Rate:Normal  S/P Bentall 09/19/18   Neuro/Psych negative neurological ROS  negative psych ROS   GI/Hepatic negative GI ROS, Neg liver ROS,   Endo/Other  negative endocrine ROS  Renal/GU negative Renal ROS  negative genitourinary   Musculoskeletal negative musculoskeletal ROS (+)   Abdominal   Peds negative pediatric ROS (+)  Hematology negative hematology ROS (+)   Anesthesia Other Findings   Reproductive/Obstetrics negative OB ROS                             Anesthesia Physical Anesthesia Plan  ASA: III  Anesthesia Plan: General   Post-op Pain Management:    Induction: Intravenous and Rapid sequence  PONV Risk Score and Plan: 2 and Ondansetron, Dexamethasone and Treatment may vary due to age or medical condition  Airway Management Planned: Oral ETT  Additional Equipment:   Intra-op Plan:   Post-operative Plan: Extubation in OR  Informed Consent: I have reviewed the patients History and Physical, chart, labs and discussed the procedure including the risks, benefits and alternatives for the proposed anesthesia with the patient or authorized representative who has indicated his/her understanding and acceptance.     Dental advisory given  Plan Discussed with: CRNA and Surgeon  Anesthesia Plan Comments:         Anesthesia Quick Evaluation

## 2018-09-26 NOTE — Progress Notes (Signed)
Pt ambulated x 4 laps around HR continue to increase to 140's when ambulating pt asymptomatic will continue to monitor

## 2018-09-26 NOTE — Anesthesia Procedure Notes (Signed)
Procedure Name: Intubation Performed by: Milford Cage, CRNA Pre-anesthesia Checklist: Patient identified, Emergency Drugs available, Suction available and Patient being monitored Patient Re-evaluated:Patient Re-evaluated prior to induction Oxygen Delivery Method: Circle System Utilized Preoxygenation: Pre-oxygenation with 100% oxygen Induction Type: IV induction, Cricoid Pressure applied and Rapid sequence Laryngoscope Size: Mac and 4 Grade View: Grade II Tube type: Oral Tube size: 7.5 mm Number of attempts: 2 Airway Equipment and Method: Stylet and Oral airway Placement Confirmation: ETT inserted through vocal cords under direct vision,  positive ETCO2 and breath sounds checked- equal and bilateral Secured at: 24 cm Tube secured with: Tape Dental Injury: Teeth and Oropharynx as per pre-operative assessment

## 2018-09-26 NOTE — Progress Notes (Signed)
D/C instructions given to pt. Wound care, sternal precautions and medications reviewed. All questions answered. Instructions on how to take on CT sutures and suture removal kit given. IV removed, clean and intact. Mom to escort pt home.  Clyde Canterbury, RN

## 2018-09-26 NOTE — Progress Notes (Signed)
Pt coughing in room and noticed drainage from lower portion of midsternal incision. Moderate amt of serosanguinous fluid. Gauze dressing applied. Erin Barrett PA-C paged.   Clyde Canterbury, RN

## 2018-09-26 NOTE — Progress Notes (Signed)
Patient ID: Henry Duncan, male   DOB: 10-Oct-1990, 28 y.o.   MRN: [id number] TCTS:   Patient coughed this am and began draining a moderate amount of serosanguinous fluid from the lower end of the sternotomy incision. Sternum feels stable but it is continuing to drain. I suspect he has a pericardial effusion and dehiscence of the fascia in the lower end of the wound. Tachycardia has improved since this began draining. He has been hemodynamically stable. I think it would be best to explore the lower end of the sternotomy incision to allow complete drainage of any remaining pericardial effusion and repair of the fascial dehiscence. I discussed the procedure and risks with him and he agrees to proceed.

## 2018-09-26 NOTE — Progress Notes (Signed)
Dr. Kalman Shan notified of PO intake at 0800.

## 2018-09-26 NOTE — Brief Op Note (Signed)
09/16/2018 - 09/26/2018  3:26 PM  PATIENT:  Henry Duncan  28 y.o. male  PRE-OPERATIVE DIAGNOSIS:  pericardial effusion  POST-OPERATIVE DIAGNOSIS:  pericardial effusion  PROCEDURE:  Procedure(s): SUBXYPHOID PERICARDIAL WINDOW (N/A)  SURGEON:  Surgeon(s) and Role:    * Jaleiah Asay, Fernande Boyden, MD - Primary  PHYSICIAN ASSISTANT: Jadene Pierini, PA-C   ANESTHESIA:   general  EBL:  30 mL   BLOOD ADMINISTERED:none  DRAINS: none   LOCAL MEDICATIONS USED:  NONE  SPECIMEN:  No Specimen  DISPOSITION OF SPECIMEN:  N/A  COUNTS:  YES  TOURNIQUET:  * No tourniquets in log *  DICTATION: .Note written in EPIC  PLAN OF CARE: Admit to inpatient   PATIENT DISPOSITION:  PACU - hemodynamically stable.   Delay start of Pharmacological VTE agent (>24hrs) due to surgical blood loss or risk of bleeding: not applicable

## 2018-09-26 NOTE — Op Note (Signed)
  CARDIOVASCULAR SURGERY OPERATIVE NOTE  09/26/2018  Surgeon:  Gaye Pollack, MD  First Assistant: Jadene Pierini, PA-C   Preoperative Diagnosis:  Pericardial effusion   Postoperative Diagnosis:  Same  Procedure:  Subxyphoid pericardial window   Anesthesia:  General Endotracheal   Clinical History/Surgical Indication:  The patient is a 28 year old gentleman who is 7 days s/p Bentall procedure with a St. Jude mechanical valve conduit for a bicuspid aortic valve with an ascending aortic aneurysm and focal sub-acute type A dissection. He has had an uncomplicated postop course but has had a persistent resting sinus tachycardia and was scheduled to go home today since he was therapeutic on Coumadin but this morning began draining serosanguinous fluid from the lower chest incision. This continue to drain and I felt that most likely he had a significant pericardial effusion that spontaneously drained through the subxyphoid fascia. I though it would be best to return to the OR for exploration of the subxyphoid incision to allow complete drainage of the effusion and closure of the midline fascia. I discussed the procedure with the patient including the benefits and risks and he understood and agree to proceed.   Preparation:  The patient was seen in the preoperative holding area and the correct patient, correct operation were confirmed with the patient after reviewing the medical record and catheterization. The consent was signed by me. Preoperative antibiotics were given.  The patient was taken back to the operating room and positioned supine on the operating room table. After being placed under general endotracheal anesthesia by the anesthesia team the neck, chest, and abdomen were prepped with betadine soap and solution and draped in the usual sterile manner. A surgical time-out was taken and the correct patient and operative procedure were confirmed with the nursing and anesthesia  staff.    Subxyphoid pericardial window:  The lower portion of the sternotomy incision over the xyphoid process was opened and drained about 100 cc of old serosanguinous fluid immediately. The midline fascia was separated below the xyphoid process. I carefully explored the substernal and posterior pericardial space with the suction and there was no further fluid. Most of the heart was adherent to the pericardium and I think the only place the suction would pass was where the previous chest tubes were. Hemostasis was complete. The midline fascia was approximated with interrupted #0 vicryl sutures. The subcutaneous tissue was approximated with continuous 2-0 vicryl suture and the skin with 3-0 nylon interrupted vertical mattress sutures. The sponge, needle, and instrument counts were correct according to the nurses. Dry sterile dressing was applied to the incision. The patient was awakened, extubated, and transported to the PACU in stable condition.

## 2018-09-26 NOTE — Transfer of Care (Signed)
Immediate Anesthesia Transfer of Care Note  Patient: [name]  Procedure(s) Performed: SUBXYPHOID PERICARDIAL WINDOW (N/A Chest)  Patient Location: PACU  Anesthesia Type:General  Level of Consciousness: awake  Airway & Oxygen Therapy: Patient Spontanous Breathing and Patient connected to face mask oxygen  Post-op Assessment: Report given to RN and Post -op Vital signs reviewed and stable  Post vital signs: Reviewed and stable  Last Vitals:  Vitals Value Taken Time  BP 105/65 09/26/2018  3:48 PM  Temp    Pulse 110 09/26/2018  3:50 PM  Resp 21 09/26/2018  3:50 PM  SpO2 94 % 09/26/2018  3:50 PM  Vitals shown include unvalidated device data.  Last Pain:  Vitals:   09/26/18 1403  TempSrc:   PainSc: 0-No pain      Patients Stated Pain Goal: 2 (46/27/03 5009)  Complications: No apparent anesthesia complications

## 2018-09-27 ENCOUNTER — Encounter (HOSPITAL_COMMUNITY): Payer: Self-pay | Admitting: Surgery

## 2018-09-27 ENCOUNTER — Telehealth: Payer: Self-pay

## 2018-09-27 LAB — PROTIME-INR
INR: 2.2 — ABNORMAL HIGH (ref 0.8–1.2)
Prothrombin Time: 24.5 seconds — ABNORMAL HIGH (ref 11.4–15.2)

## 2018-09-27 MED ORDER — POTASSIUM CHLORIDE CRYS ER 20 MEQ PO TBCR
40.0000 meq | EXTENDED_RELEASE_TABLET | Freq: Once | ORAL | Status: AC
  Administered 2018-09-27: 40 meq via ORAL
  Filled 2018-09-27: qty 2

## 2018-09-27 MED ORDER — TRAMADOL HCL 50 MG PO TABS: 50.0000 mg | ORAL_TABLET | Freq: Four times a day (QID) | ORAL | 0 refills | Status: DC | PRN

## 2018-09-27 MED ORDER — METOPROLOL TARTRATE 75 MG PO TABS: 75.0000 mg | ORAL_TABLET | Freq: Two times a day (BID) | ORAL | 1 refills | Status: DC

## 2018-09-27 MED ORDER — METOPROLOL TARTRATE 50 MG PO TABS: 75.0000 mg | ORAL_TABLET | Freq: Two times a day (BID) | ORAL | Status: DC

## 2018-09-27 MED ORDER — WARFARIN SODIUM 5 MG PO TABS: 5.0000 mg | ORAL_TABLET | Freq: Once | ORAL | 3 refills | Status: DC

## 2018-09-27 NOTE — Progress Notes (Addendum)
      [address]       [place] [address]             [phone]      1 Day Post-Op Procedure(s) (LRB): SUBXYPHOID PERICARDIAL WINDOW (N/A) Subjective: Feels pretty well Sinus tachy  Objective: Vital signs in last 24 hours: Temp:  [97.8 F (36.6 C)-99.1 F (37.3 C)] 97.8 F (36.6 C) (05/05 0403) Pulse Rate:  [72-116] 94 (05/05 0403) Cardiac Rhythm: Sinus tachycardia (05/04 1931) Resp:  [11-27] 17 (05/05 0403) BP: (98-128)/(56-87) 121/80 (05/05 0403) SpO2:  [79 %-100 %] 97 % (05/05 0403) Weight:  [78.5 kg] 78.5 kg (05/05 0403)  Hemodynamic parameters for last 24 hours:    Intake/Output from previous day: 05/04 0701 - 05/05 0700 In: 1090 [P.O.:240; I.V.:800; IV Piggyback:50] Out: 205 [Urine:175; Blood:30] Intake/Output this shift: No intake/output data recorded.  General appearance: alert, cooperative and no distress Heart: regular rate and rhythm and tachy Lungs: mildly dim in bases Abdomen: soft, non-tender Extremities: no edema Wound: dressing CDI to new incis, O/w looks good  Lab Results: Recent Labs    09/26/18 0408  WBC 13.9*  HGB 9.8*  HCT 29.1*  PLT 487*   BMET:  Recent Labs    09/26/18 0408  NA 133*  K 2.7*  CL 83*  CO2 34*  GLUCOSE 110*  BUN 10  CREATININE 0.82  CALCIUM 9.4    PT/INR:  Recent Labs    09/27/18 0230  LABPROT 24.5*  INR 2.2*   ABG    Component Value Date/Time   PHART 7.369 09/19/2018 2312   HCO3 20.9 09/19/2018 2312   TCO2 22 09/19/2018 2312   ACIDBASEDEF 4.0 (H) 09/19/2018 2312   O2SAT 99.0 09/19/2018 2312   CBG (last 3)  No results for input(s): GLUCAP in the last 72 hours.  Meds Scheduled Meds: . dextromethorphan-guaiFENesin  1 tablet Oral BID  . ferrous [id number] C-folic acid  1 capsule Oral BID PC  . metoprolol tartrate  50 mg Oral BID  . pantoprazole  40 mg Oral QAC breakfast  . sodium chloride flush  3 mL Intravenous Q12H  . Warfarin - Physician Dosing Inpatient    Does not apply q1800   Continuous Infusions: PRN Meds:.acetaminophen, ketorolac, ondansetron **OR** [DISCONTINUED] ondansetron (ZOFRAN) IV, oxyCODONE, sodium chloride flush, traMADol  Xrays No results found.  Assessment/Plan: S/P Procedure(s) (LRB): SUBXYPHOID PERICARDIAL WINDOW (N/A)  1 doing well 2 hemodyn stable with sinus tachy, ? May need to increase beta blocker a little 3 INR 2.2, cont coumadin 5 mg 4 poss home later today    LOS: 11 days    [facility] 09/27/2018 Pager [phone]   Chart reviewed, patient examined, agree with above. He feels well and walking the halls. Says he is eating better since fluid drained.  BP good. Still sinus tachy but a little lower 100-110. Will increase Lopressor to 75 bid. I think he can go home today. I will see him in the office next week to check his incision. Continue Coumadin 5 mg.

## 2018-09-27 NOTE — Telephone Encounter (Signed)

## 2018-09-27 NOTE — Progress Notes (Signed)
Pt iv removed and intact. PT educated and provided discharge instructions. Telebox removed/CCMD notified. Vitals stable. Pt denies any complaints. Pt has all belongings. Volunteers to transport pt via wheelchair to valet to meet ride.  Jerald Kief, RN

## 2018-09-28 ENCOUNTER — Telehealth: Payer: Self-pay | Admitting: Student

## 2018-09-28 ENCOUNTER — Telehealth: Payer: Self-pay

## 2018-09-28 NOTE — Telephone Encounter (Signed)
Called patient to [place]/d hospital f/u next week per Dr. Jeananne Rama. No answer, LVM for patient to call back and [place]/d appointment

## 2018-09-28 NOTE — Telephone Encounter (Signed)
Routing to provider as Juluis Rainier.  Please send back to pool.

## 2018-09-28 NOTE — Telephone Encounter (Signed)
Virtual Visit Pre-Appointment Phone Call  "(Name), I am calling you today to discuss your upcoming appointment. We are currently trying to limit exposure to the virus that causes COVID-19 by seeing patients at home rather than in the office."  1. "What is the BEST phone number to call the day of the visit?" - include this in appointment notes  2. Do you have or have access to (through a family member/friend) a smartphone with video capability that we can use for your visit?" a. If yes - list this number in appt notes as cell (if different from BEST phone #) and list the appointment type as a VIDEO visit in appointment notes b. If no - list the appointment type as a PHONE visit in appointment notes  3. Confirm consent - "In the setting of the current Covid19 crisis, you are scheduled for a (phone or video) visit with your provider on (date) at (time).  Just as we do with many in-office visits, in order for you to participate in this visit, we must obtain consent.  If you'd like, I can send this to your mychart (if signed up) or email for you to review.  Otherwise, I can obtain your verbal consent now.  All virtual visits are billed to your insurance company just like a normal visit would be.  By agreeing to a virtual visit, we'd like you to understand that the technology does not allow for your provider to perform an examination, and thus may limit your provider's ability to fully assess your condition. If your provider identifies any concerns that need to be evaluated in person, we will make arrangements to do so.  Finally, though the technology is [facility] good, we cannot assure that it will always work on either your or our end, and in the setting of a video visit, we may have to convert it to a phone-only visit.  In either situation, we cannot ensure that we have a secure connection.  Are you willing to proceed?" STAFF: Did the patient verbally acknowledge consent to telehealth visit? Document  YES/NO here: Yes  4. Advise patient to be prepared - "Two hours prior to your appointment, go ahead and check your blood pressure, pulse, oxygen saturation, and your weight (if you have the equipment to check those) and write them all down. When your visit starts, your provider will ask you for this information. If you have an Apple Watch or Kardia device, please plan to have heart rate information ready on the day of your appointment. Please have a pen and paper handy nearby the day of the visit as well."  5. Give patient instructions for MyChart download to smartphone OR [email or web address] as below if video visit (depending on what platform provider is using)  6. Inform patient they will receive a phone call 15 minutes prior to their appointment time (may be from unknown caller ID) so they should be prepared to answer    TELEPHONE CALL NOTE  Henry Henry Duncan has been deemed a candidate for a follow-up tele-health visit to limit community exposure during the Covid-19 pandemic. I spoke with the patient via phone to ensure availability of phone/video source, confirm preferred email & phone number, and discuss instructions and expectations.  I reminded Henry Henry Duncan to be prepared with any vital sign and/or heart rhythm information that could potentially be obtained via home monitoring, at the time of his visit. I reminded Henry Henry Duncan to expect a phone call prior to  his visit.  [name] 09/28/2018 12:30 PM

## 2018-09-29 ENCOUNTER — Other Ambulatory Visit: Payer: Self-pay

## 2018-09-29 ENCOUNTER — Ambulatory Visit: Payer: Self-pay | Admitting: Family Medicine

## 2018-09-29 ENCOUNTER — Ambulatory Visit (INDEPENDENT_AMBULATORY_CARE_PROVIDER_SITE_OTHER): Payer: BLUE CROSS/BLUE SHIELD | Admitting: Pharmacist

## 2018-09-29 DIAGNOSIS — G8918 Other acute postprocedural pain: Secondary | ICD-10-CM

## 2018-09-29 DIAGNOSIS — Z952 Presence of prosthetic heart valve: Secondary | ICD-10-CM

## 2018-09-29 DIAGNOSIS — Z5181 Encounter for therapeutic drug level monitoring: Secondary | ICD-10-CM

## 2018-09-29 LAB — POCT INR: INR: 2.9 (ref 2.0–3.0)

## 2018-09-29 MED ORDER — TRAMADOL HCL 50 MG PO TABS: 50.0000 mg | ORAL_TABLET | Freq: Four times a day (QID) | ORAL | 0 refills | Status: AC | PRN

## 2018-09-29 NOTE — Telephone Encounter (Signed)
Called patient too soon.  He is not home yet.  Try tomorrow.

## 2018-09-29 NOTE — Patient Instructions (Signed)
Description   Continue taking one tablet of warfarin daily. Recheck INR in one week. Call [facility]in Clinic with any concerns at [phone].

## 2018-09-29 NOTE — Anesthesia Postprocedure Evaluation (Signed)
Anesthesia Post Note  Patient: Henry Duncan  Procedure(s) Performed: SUBXYPHOID PERICARDIAL WINDOW (N/A Chest)     Patient location during evaluation: PACU Anesthesia Type: General Level of consciousness: awake and alert Pain management: pain level controlled Vital Signs Assessment: post-procedure vital signs reviewed and stable Respiratory status: spontaneous breathing, nonlabored ventilation, respiratory function stable and patient connected to nasal cannula oxygen Cardiovascular status: blood pressure returned to baseline and stable Postop Assessment: no apparent nausea or vomiting Anesthetic complications: no    Last Vitals:  Vitals:   09/27/18 0812 09/27/18 1017  BP: 123/78   Pulse: (!) 115   Resp:  (!) 23  Temp:    SpO2:      Last Pain:  Vitals:   09/27/18 1017  TempSrc:   PainSc: 6                  Megha Agnes S

## 2018-09-30 NOTE — Telephone Encounter (Signed)
Called and left patient a VM asking for him to please return my call.  

## 2018-10-03 NOTE — Telephone Encounter (Signed)
Pt scheduled for Thursday @ 10 a.m.

## 2018-10-05 ENCOUNTER — Encounter: Payer: Self-pay | Admitting: Student

## 2018-10-05 ENCOUNTER — Other Ambulatory Visit: Payer: Self-pay | Admitting: Surgery

## 2018-10-05 ENCOUNTER — Telehealth (INDEPENDENT_AMBULATORY_CARE_PROVIDER_SITE_OTHER): Payer: BLUE CROSS/BLUE SHIELD | Admitting: Student

## 2018-10-05 ENCOUNTER — Telehealth: Payer: Self-pay

## 2018-10-05 DIAGNOSIS — Z7189 Other specified counseling: Secondary | ICD-10-CM

## 2018-10-05 DIAGNOSIS — Z9889 Other specified postprocedural states: Secondary | ICD-10-CM | POA: Diagnosis not present

## 2018-10-05 DIAGNOSIS — Z8679 Personal history of other diseases of the circulatory system: Secondary | ICD-10-CM | POA: Diagnosis not present

## 2018-10-05 DIAGNOSIS — Z7901 Long term (current) use of anticoagulants: Secondary | ICD-10-CM

## 2018-10-05 DIAGNOSIS — I712 Thoracic aortic aneurysm, without rupture, unspecified: Secondary | ICD-10-CM

## 2018-10-05 NOTE — Patient Instructions (Signed)
Medication Instructions:  Your physician recommends that you continue on your current medications as directed. Please refer to the Current Medication list given to you today.   Labwork: NONE   Testing/Procedures: NONE   Follow-Up: Your physician recommends that you schedule a follow-up appointment in: 3 Months with Dr. Bronson Ing in the [place] office    Any Other Special Instructions Will Be Listed Below (If Applicable).     If you need a refill on your cardiac medications before your next appointment, please call your pharmacy.  Thank you for choosing [facility]!

## 2018-10-05 NOTE — Telephone Encounter (Signed)
lmom for prescreen  

## 2018-10-05 NOTE — Progress Notes (Signed)
Virtual Visit via Video Note   This visit type was conducted due to national recommendations for restrictions regarding the COVID-19 Pandemic (e.g. social distancing) in an effort to limit this patient's exposure and mitigate transmission in our community.  Due to his co-morbid illnesses, this patient is at least at moderate risk for complications without adequate follow up.  This format is felt to be most appropriate for this patient at this time.  All issues noted in this document were discussed and addressed.  A limited physical exam was performed with this format.  Please refer to the patient's chart for his consent to telehealth for [facility].   Date:  10/05/2018   ID:  Henry Duncan, DOB Dec 30, 1990, MRN [id number]  Patient Location: Home Provider Location: Home  PCP:  Guadalupe Maple, MD  Cardiologist:  Kate Sable, MD  Electrophysiologist:  None   Evaluation Performed:  Follow-Up Visit  Chief Complaint:  Hospital Follow-up  History of Present Illness:    Henry Duncan is a 28 y.o. male with no significant past medical history prior to his recent hospitalization who presents for follow-up telehealth visit.   He was examined by Dr. Bronson Ing while at [facility] ED on 09/13/2018 as he had presented with new-onset chest pain which awoke him from sleep and he developed associated diaphoresis. Upon arrival, troponin values were 0.03 and 0.06 with CTA showing an ascending thoracic aortic aneurysm with a measured transverse diameter of the ascending aorta of 5.2 x 5.1 cm with no evidence of dissection. He was hypertensive and Labetalol 174m BID was initiated and follow-up with CT Surgery was arranged for the following day. He met with Dr. BCyndia Benton 4/22 and there was concern he may have an underlying connective tissue disorder. Echo and Coronary CT were arranged. CTA on 4/24 showed a localized partial dissection of the anteromedial ascending thoracic aorta and it  was recommended he be admitted. He was taken to the OR on 09/19/2018 and underwent Bentall procedure with 23 mm St Jude mechanical valved conduit. He was initially progressing well and had an expected post-op anemia with anticipated discharge on 5/4. However, on that day he started to experience drainage of a serosangunious fluid from his lower excision thought to be consistent with spontaneous drainage of his pericardial effusion and he was taken back to the OR for a pericardial window.  He tolerated the procedure well and was discharged home the following day. He had a Coumadin appointment on 09/29/2018 and INR was therapeutic at 2.9.   In talking with the patient today, he reports overall doing well since his recent surgery and hospitalization.  His sternal chest discomfort continues to improve and he is now only taking Tylenol. He has been going for walks around his home without any anginal symptoms. He denies any recent dyspnea on exertion, orthopnea, or PND. No recurrent lower extremity edema since hospital discharge.   He did have a nosebleed the other day which lasted approximately 30 minutes. No recurrent symptoms since. He denies any recent melena, hematochezia, or hematuria.  He does not have a BP cuff or pulse oximeter at home and has been unable to check his vitals.   The patient does not have symptoms concerning for COVID-19 infection (fever, chills, cough, or new shortness of breath).    Past Medical History:  Diagnosis Date   Anxiety    Past Surgical History:  Procedure Laterality Date   BENTALL PROCEDURE N/A 09/19/2018   Procedure: BENTALL PROCEDURE  [place]  Surgeon: Gaye Pollack, MD;  Location: [facility] OR;  Service: Open Heart Surgery;  Laterality: N/A;   CLEFT LIP REPAIR     SUBXYPHOID PERICARDIAL WINDOW N/A 09/26/2018   Procedure: SUBXYPHOID PERICARDIAL WINDOW;  Surgeon: Gaye Pollack, MD;  Location: [facility] OR;  Service: Thoracic;  Laterality: N/A;   TEE  WITHOUT CARDIOVERSION N/A 09/19/2018   Procedure: TRANSESOPHAGEAL ECHOCARDIOGRAM (TEE);  Surgeon: Gaye Pollack, MD;  Location: [place];  Service: Open Heart Surgery;  Laterality: N/A;     Current Meds  Medication Sig   acetaminophen (TYLENOL) 325 MG tablet Take 650 mg by mouth every 6 (six) hours as needed (for pain.).   cetirizine (ZYRTEC) 10 MG tablet Take 10 mg by mouth daily as needed for allergies.   Metoprolol Tartrate 75 MG TABS Take 75 mg by mouth 2 (two) times daily.   traMADol (ULTRAM) 50 MG tablet Take 1 tablet (50 mg total) by mouth every 6 (six) hours as needed for up to 7 days for moderate pain or severe pain.   warfarin (COUMADIN) 5 MG tablet Take 1 tablet (5 mg total) by mouth one time only at 6 PM. As directed by the coumadin clinic     Allergies:   Patient has no known allergies.   Social History   Tobacco Use   Smoking status: Never Smoker   Smokeless tobacco: Never Used  Substance Use Topics   Alcohol use: No    Alcohol/week: 0.0 standard drinks    Comment: pt states on occasion   Drug use: No     Family Hx: The patient's family history includes Anxiety disorder in his brother and father; Depression in his brother; Diabetes in his father; Heart attack in his mother.  ROS:   Please see the history of present illness.     All other systems reviewed and are negative.   Prior CV studies:   The following studies were reviewed today:  Echocardiogram: 08/2018 IMPRESSIONS    1. The left ventricle has normal systolic function with an ejection fraction of 60-65%. The cavity size was normal. Left ventricular diastolic parameters were normal. No evidence of left ventricular regional wall motion abnormalities.  2. The right ventricle has normal systolic function. The cavity was normal. There is no increase in right ventricular wall thickness. Right ventricular systolic pressure could not be assessed.  3. Small pericardial effusion.  4. The pericardial  effusion is circumferential.  5. The aortic valve was not well visualized.  6. There is severe dilatation of the aortic root and of the ascending aorta measuring >50 mm.  Coronary CT: 08/2018 Vascular: Significant ascending thoracic aortic aneurysm again noted measuring up to 5.7-5.8 cm and associated with partial dissection flap along the anteromedial aspect of the ascending thoracic aorta distal to the aortic root. The aortic root measures approximately 5 cm. There remains some stranding around the ascending thoracic aorta which appears fairly stable compared to the CTA on 09/13/2018. Some contrast lateral to the ascending aorta may be some residual contrast in the right atrial appendage.  Mediastinum/Nodes: Visualized mediastinum and hilar regions demonstrate some tiny subcarinal lymph nodes and no enlarged lymph nodes.  Lungs/Pleura: Visualized lungs show no evidence of pulmonary edema, consolidation, pneumothorax, nodule or pleural fluid.  Upper Abdomen: Small hiatal hernia.  Musculoskeletal: No chest wall mass or suspicious bone lesions identified.  IMPRESSION: Aneurysmal disease of the aortic root and ascending thoracic aorta with the ascending thoracic aorta measuring  up to 5.7-5.8 cm. Localized partial dissection of the anteromedial ascending thoracic aorta noted. Findings were discussed with Dr. Meda Coffee with Cardiology and Nicholes Rough PA-C with Cardiothoracic Surgery.  IMPRESSION: 1. Ascending aorta/root aneurysm with dissection flap as described in radiology report.  2.  Suspect bicuspid aortic valve.  3. Coronary artery calcium score 476 Agatston units, placing the patient in the 99th percentile for age and gender, suggesting high risk for future cardiac events. Age advanced coronary disease.  4.  Nonobstructive coronary disease in RCA, LCx, and left main.  5. Extensive calcified plaque in the proximal to mid LAD. There is concern for moderate-severe  mid LAD stenosis but may be artifact from shadowing from adjacent calcification. Will send for FFR to confirm.  ADDENDUM: CT FFR was done.  FFR 0.83 mid LAD  FFR 0.84 mid D1  This would suggest that the LAD and D1 disease is not hemodynamically significant.  Labs/Other Tests and Data Reviewed:    EKG:  An ECG dated 09/20/2018 was personally reviewed today and demonstrated:  sinus tachycardia, HR 104.  Recent Labs: 09/16/2018: ALT 69 09/20/2018: Magnesium 2.3 09/26/2018: BUN 10; Creatinine, Ser 0.82; Hemoglobin 9.8; Platelets 487; Potassium 2.7; Sodium 133   Recent Lipid Panel No results found for: CHOL, TRIG, HDL, CHOLHDL, LDLCALC, LDLDIRECT  Wt Readings from Last 3 Encounters:  09/27/18 173 lb 1.6 oz (78.5 kg)  09/14/18 191 lb (86.6 kg)  09/06/18 191 lb (86.6 kg)     Objective:    Vital Signs:  There were no vitals taken for this visit.   General: Pleasant Caucasian male appearing in NAD Psych: Normal affect. Neuro: Alert and oriented X 3. Moves all extremities spontaneously. HEENT: Normal  Lungs:  Resp regular and unlabored in appearance.  Heart: Sternal incisions appear well-healing with no visible erythema or drainage.    ASSESSMENT & PLAN:    1. Thoracic Aortic Dissection - Coronary CT on 4/24 showed a localized partial dissection of the anteromedial ascending thoracic aorta and he underwent surgical repair on 09/19/2018. Now s/p Bentall procedure with 23 mm St Jude mechanical valved conduit. Did have a pericardial effusion post-op and underwent a pericardial window.  - he has overall progressed well since discharge. Sternal discomfort continues to improve and he denies any dyspnea, orthopnea, edema, or palpitations. No recent fever or chills.  - he reports a majority of his family members have already arranged for screening. He will continue on Lopressor 42m BID for now until vitals can be assessed at his follow-up with CT Surgery later this week.  2. Use of  Long-Term Anticoagulation - INR at 2.9 on most recent check and scheduled for repeat INR check tomorrow. He has experienced one episode of epistaxis but denies any melena, hematochezia or hematuria. Continue to monitor symptoms at this time.   3. COVID-19 Education - The signs and symptoms of COVID-19 were discussed with the patient. The importance of social distancing was discussed today.  Time:   Today, I have spent 22 minutes with the patient with telehealth technology discussing the above problems.     Medication Adjustments/Labs and Tests Ordered: Current medicines are reviewed at length with the patient today.  Concerns regarding medicines are outlined above.   Tests Ordered: No orders of the defined types were placed in this encounter.   Medication Changes: No orders of the defined types were placed in this encounter.   Disposition:  Follow up with Dr. KBronson Ingin 2-3 months.   Signed, BErma Heritage  PA-C  10/05/2018 4:46 PM    [facility]

## 2018-10-06 ENCOUNTER — Ambulatory Visit: Payer: BLUE CROSS/BLUE SHIELD | Admitting: Family Medicine

## 2018-10-06 ENCOUNTER — Other Ambulatory Visit: Payer: Self-pay

## 2018-10-06 ENCOUNTER — Ambulatory Visit (INDEPENDENT_AMBULATORY_CARE_PROVIDER_SITE_OTHER): Payer: BLUE CROSS/BLUE SHIELD | Admitting: Pharmacist

## 2018-10-06 DIAGNOSIS — Z5181 Encounter for therapeutic drug level monitoring: Secondary | ICD-10-CM

## 2018-10-06 DIAGNOSIS — Z952 Presence of prosthetic heart valve: Secondary | ICD-10-CM | POA: Diagnosis not present

## 2018-10-06 LAB — POCT INR: INR: 1.4 — AB (ref 2.0–3.0)

## 2018-10-07 ENCOUNTER — Ambulatory Visit (INDEPENDENT_AMBULATORY_CARE_PROVIDER_SITE_OTHER): Payer: Self-pay | Admitting: Surgery

## 2018-10-07 ENCOUNTER — Encounter: Payer: Self-pay | Admitting: Surgery

## 2018-10-07 ENCOUNTER — Ambulatory Visit
Admission: RE | Admit: 2018-10-07 | Discharge: 2018-10-07 | Disposition: A | Discharge status: Home / Self Care | Payer: BLUE CROSS/BLUE SHIELD | Source: Ambulatory Visit | Attending: Surgery | Admitting: Surgery

## 2018-10-07 VITALS — BP 122/76 | HR 92 | Temp 98.1°F | Resp 20 | Ht 67.0 in | Wt 180.0 lb

## 2018-10-07 DIAGNOSIS — I712 Thoracic aortic aneurysm, without rupture, unspecified: Secondary | ICD-10-CM

## 2018-10-07 DIAGNOSIS — I3139 Other pericardial effusion (noninflammatory): Secondary | ICD-10-CM

## 2018-10-07 DIAGNOSIS — I313 Pericardial effusion (noninflammatory): Secondary | ICD-10-CM

## 2018-10-07 DIAGNOSIS — Z09 Encounter for follow-up examination after completed treatment for conditions other than malignant neoplasm: Secondary | ICD-10-CM

## 2018-10-07 NOTE — Progress Notes (Signed)
HPI: Patient returns for routine postoperative follow-up having undergone right axillary artery cannulation for extracorporeal circulation with replacement of the ascending aortic aneurysm under deep hypothermic circulatory arrest with antegrade cerebral perfusion and Bentall procedure using a 23 mm Saint Jude mechanical valved conduit on 09/19/2018. The patient's early postoperative recovery while in the hospital was notable for an uncomplicated postoperative course but on the day of planned discharge she suddenly started draining serous sanguinous fluid from the lower end of his chest incision.  He was returned to the operating room on 09/26/2022 subxiphoid drainage of a localized serosanguineous pericardial effusion and closure of the fascial separation at the lower end of the chest incision. Since hospital discharge the patient reports that he has been feeling well.  He is walking daily without chest pain or shortness of breath.  His INR at the time of discharge 09/27/2018 was 2.2 on Coumadin 5 mg daily.  His INR on 09/29/2018 was 2.9.  Repeat INR on 10/06/2018 and dropped to 1.4.  His Coumadin dose was increased by the anticoagulation clinic and a follow-up is pending.   Current Outpatient Medications  Medication Sig Dispense Refill  . acetaminophen (TYLENOL) 325 MG tablet Take 650 mg by mouth every 6 (six) hours as needed (for pain.).    Marland Kitchen cetirizine (ZYRTEC) 10 MG tablet Take 10 mg by mouth daily as needed for allergies.    . Metoprolol Tartrate 75 MG TABS Take 75 mg by mouth 2 (two) times daily. 60 tablet 1  . warfarin (COUMADIN) 5 MG tablet Take 1 tablet (5 mg total) by mouth one time only at 6 PM. As directed by the coumadin clinic 100 tablet 3   No current facility-administered medications for this visit.     Physical Exam: BP 122/76   Pulse 92   Temp 98.1 F (36.7 C) (Skin)   Resp 20   Ht 5\' 7"  (1.702 m)   Wt 180 lb (81.6 kg)   SpO2 99% Comment: RA  BMI 28.19 kg/m  He looks  well. Cardiac exam shows a regular rate and rhythm with crisp mechanical valve click.  There is no murmur. Lungs are clear. The chest incision is healing well and the sternum is stable.  The nylon sutures were removed from the lower end of the chest incision as were the chest tube sutures.  Diagnostic Tests:  CLINICAL DATA:  History of aortic surgery  EXAM: CHEST - 2 VIEW  COMPARISON:  09/22/2018  FINDINGS: Cardiac shadow is enlarged but stable. Postsurgical changes are again noted. Right jugular central line is been removed. The lungs are clear. Aortic valve is noted in place. No bony abnormality is seen.  IMPRESSION: Postsurgical change without acute abnormality.   Electronically Signed   By: Inez Catalina M.D.   On: 10/07/2018 12:55  Impression:  Overall I think he is making a very good recovery following his surgery.  I encouraged him to continue walking as much as possible.  I told him he can return to driving a car but should refrain from lifting anything heavier than 10 pounds for 3 months postoperatively.  His INR was down to 1.4 and his Coumadin dose has been adjusted by the anticoagulation clinic.  His goal should be 2.5-3.0.  I stressed the importance of maintaining adequate anticoagulation with a mechanical valve.  I advised him against having any more tattoos.  Plan:  He is going to follow-up with Dr. Bronson Ing for his cardiology care as well as his  PCP.  I will plan to see him back in 1 year with a CTA of the chest to follow-up on the remainder of his aorta.  If his scan looks okay at that time he will not require any further CT scan follow-up.   Gaye Pollack, MD [facility] [phone]

## 2018-10-11 ENCOUNTER — Encounter: Payer: Self-pay | Admitting: Surgery

## 2018-10-12 ENCOUNTER — Telehealth: Payer: Self-pay

## 2018-10-12 NOTE — Telephone Encounter (Signed)

## 2018-10-13 ENCOUNTER — Ambulatory Visit (INDEPENDENT_AMBULATORY_CARE_PROVIDER_SITE_OTHER): Payer: BLUE CROSS/BLUE SHIELD | Admitting: *Deleted

## 2018-10-13 DIAGNOSIS — Z952 Presence of prosthetic heart valve: Secondary | ICD-10-CM | POA: Diagnosis not present

## 2018-10-13 DIAGNOSIS — Z5181 Encounter for therapeutic drug level monitoring: Secondary | ICD-10-CM

## 2018-10-13 LAB — POCT INR: INR: 1.9 — AB (ref 2.0–3.0)

## 2018-10-13 NOTE — Patient Instructions (Addendum)
  Description   Spoke with pt and instructed him to take 1.5 tablets today and tomorrow, then start taking 1 tablet daily except for 1.5 tablets Sunday, Tuesday and Thursday. Recheck INR in one week. Call Coumadin Clinic with any concerns at [phone].

## 2018-10-14 ENCOUNTER — Other Ambulatory Visit: Payer: Self-pay

## 2018-10-19 ENCOUNTER — Telehealth: Payer: Self-pay

## 2018-10-19 NOTE — Telephone Encounter (Signed)

## 2018-10-19 NOTE — Telephone Encounter (Signed)
LMOM FOR PRESCREEN  

## 2018-10-20 ENCOUNTER — Other Ambulatory Visit: Payer: Self-pay

## 2018-10-20 ENCOUNTER — Ambulatory Visit (INDEPENDENT_AMBULATORY_CARE_PROVIDER_SITE_OTHER): Payer: BLUE CROSS/BLUE SHIELD | Admitting: *Deleted

## 2018-10-20 DIAGNOSIS — Z952 Presence of prosthetic heart valve: Secondary | ICD-10-CM

## 2018-10-20 DIAGNOSIS — Z5181 Encounter for therapeutic drug level monitoring: Secondary | ICD-10-CM

## 2018-10-20 LAB — POCT INR: INR: 5.1 — AB (ref 2.0–3.0)

## 2018-10-20 NOTE — Patient Instructions (Signed)
Description   Spoke with pt and instructed him to hold today's dose and take 1/2 tablet tomorrow then reduce dose to 1 tablet daily except for 1.5 tablets Sundays and Thursdays. Recheck INR in one week. Call Coumadin Clinic with any concerns at [phone].

## 2018-10-24 ENCOUNTER — Telehealth: Payer: Self-pay

## 2018-10-24 NOTE — Telephone Encounter (Signed)

## 2018-10-26 ENCOUNTER — Ambulatory Visit: Payer: BLUE CROSS/BLUE SHIELD | Admitting: Surgery

## 2018-10-27 ENCOUNTER — Other Ambulatory Visit: Payer: Self-pay

## 2018-10-27 ENCOUNTER — Ambulatory Visit (INDEPENDENT_AMBULATORY_CARE_PROVIDER_SITE_OTHER): Payer: BC Managed Care – PPO | Admitting: *Deleted

## 2018-10-27 DIAGNOSIS — Z5181 Encounter for therapeutic drug level monitoring: Secondary | ICD-10-CM

## 2018-10-27 DIAGNOSIS — Z952 Presence of prosthetic heart valve: Secondary | ICD-10-CM

## 2018-10-27 LAB — POCT INR: INR: 5 — AB (ref 2.0–3.0)

## 2018-10-27 NOTE — Patient Instructions (Signed)
Description    Hold today and tomorrows dose and then reduce dose to 1 tablet daily except for 1.5 tablets Sundays . Recheck INR in one week. Call Coumadin Clinic with any concerns at [phone].

## 2018-10-31 ENCOUNTER — Ambulatory Visit (HOSPITAL_COMMUNITY)
Admission: RE | Admit: 2018-10-31 | Discharge: 2018-10-31 | Disposition: A | Discharge status: Home / Self Care | Payer: Self-pay | Source: Ambulatory Visit | Attending: Cardiovascular Disease | Admitting: Cardiovascular Disease

## 2018-10-31 ENCOUNTER — Other Ambulatory Visit: Payer: Self-pay

## 2018-10-31 NOTE — Progress Notes (Signed)
Called patient to follow up on referral to Phase II Cardiac Rehab s/p AVR and to verify his interest in both the home based program and face to face when we reopen. He says he is still interested. I informed him that we have already sent the application to his phone. He said he would go back and look for the text message and download the application. He is currently walking everyday. He does not have a blood pressure monitor but does have a fit bit. I informed him that once he downloads the application, we would send him an exercise prescription and communicate with him through the chat feature. He verbalized understanding.   Marland Kitchen       Confirm Consent - "In the setting of the current Covid19 crisis, you are scheduled for a phone visit with your Cardiac or Pulmonary team member on (date) at (time).  Just as we do with many in-gym visits, in order for you to participate in this visit, we must obtain consent.  If you'd like, I can send this to your mychart (if signed up) or email for you to review.  Otherwise, I can obtain your verbal consent now.  By agreeing to a telephone visit, we'd like you to understand that the technology does not allow for your Cardiac or Pulmonary Rehab team member to perform a physical assessment, and thus may limit their ability to fully assess your ability to perform exercise programs. If your provider identifies any concerns that need to be evaluated in person, we will make arrangements to do so).  Finally, though the technology is pretty good, we cannot assure that it will always work on either your or our end and we cannot ensure that we have a secure connection.  Cardiac and Pulmonary Rehab Telehealth visits and "At Home" cardiac and pulmonary rehab are provided at no cost to you. Are you willing to proceed?" STAFF: Did the patient verbally acknowledge consent to telehealth visit? Document YES/NO here: Yes

## 2018-11-04 ENCOUNTER — Other Ambulatory Visit: Payer: Self-pay

## 2018-11-04 ENCOUNTER — Ambulatory Visit (INDEPENDENT_AMBULATORY_CARE_PROVIDER_SITE_OTHER): Payer: BC Managed Care – PPO

## 2018-11-04 DIAGNOSIS — Z5181 Encounter for therapeutic drug level monitoring: Secondary | ICD-10-CM | POA: Diagnosis not present

## 2018-11-04 DIAGNOSIS — Z952 Presence of prosthetic heart valve: Secondary | ICD-10-CM

## 2018-11-04 LAB — POCT INR: INR: 1.5 — AB (ref 2.0–3.0)

## 2018-11-04 NOTE — Patient Instructions (Signed)
Description   Take 1.5 tablets today and tomorrow, then start taking 1 tablet daily except for 1.5 tablets Sundays and Thursdays. Recheck INR in one week. Call Coumadin Clinic with any concerns at [phone].

## 2018-11-07 ENCOUNTER — Telehealth: Payer: Self-pay

## 2018-11-07 NOTE — Telephone Encounter (Signed)
Unable to lmom for prescreen  

## 2018-11-11 ENCOUNTER — Ambulatory Visit (INDEPENDENT_AMBULATORY_CARE_PROVIDER_SITE_OTHER): Payer: BC Managed Care – PPO

## 2018-11-11 ENCOUNTER — Other Ambulatory Visit: Payer: Self-pay

## 2018-11-11 DIAGNOSIS — Z952 Presence of prosthetic heart valve: Secondary | ICD-10-CM | POA: Diagnosis not present

## 2018-11-11 DIAGNOSIS — Z5181 Encounter for therapeutic drug level monitoring: Secondary | ICD-10-CM | POA: Diagnosis not present

## 2018-11-11 LAB — POCT INR: INR: 1.6 — AB (ref 2.0–3.0)

## 2018-11-11 NOTE — Patient Instructions (Signed)
Description   Take 1.5 tablets today and tomorrow, then start taking 1 tablet daily except for 1.5 tablets Sundays, Tuesdays and Thursdays. Recheck INR in one week. Call Coumadin Clinic with any concerns at [phone].

## 2018-11-14 ENCOUNTER — Telehealth: Payer: Self-pay

## 2018-11-14 NOTE — Telephone Encounter (Signed)
lmom for prescreen  

## 2018-11-15 ENCOUNTER — Encounter: Payer: Self-pay | Admitting: Surgery

## 2018-11-18 ENCOUNTER — Ambulatory Visit (INDEPENDENT_AMBULATORY_CARE_PROVIDER_SITE_OTHER): Payer: BC Managed Care – PPO | Admitting: *Deleted

## 2018-11-18 ENCOUNTER — Other Ambulatory Visit: Payer: Self-pay

## 2018-11-18 DIAGNOSIS — Z5181 Encounter for therapeutic drug level monitoring: Secondary | ICD-10-CM | POA: Diagnosis not present

## 2018-11-18 DIAGNOSIS — Z952 Presence of prosthetic heart valve: Secondary | ICD-10-CM

## 2018-11-18 LAB — POCT INR: INR: 3.6 — AB (ref 2.0–3.0)

## 2018-11-18 NOTE — Patient Instructions (Signed)
Description   Do not take any Coumadin then continue taking 1 tablet daily except for 1.5 tablets Sundays, Tuesdays and Thursdays. Recheck INR in one week. Call Coumadin Clinic with any concerns at [phone].

## 2018-11-24 ENCOUNTER — Ambulatory Visit (INDEPENDENT_AMBULATORY_CARE_PROVIDER_SITE_OTHER): Payer: PRIVATE HEALTH INSURANCE | Admitting: *Deleted

## 2018-11-24 ENCOUNTER — Telehealth: Payer: Self-pay

## 2018-11-24 ENCOUNTER — Other Ambulatory Visit: Payer: Self-pay

## 2018-11-24 DIAGNOSIS — Z5181 Encounter for therapeutic drug level monitoring: Secondary | ICD-10-CM

## 2018-11-24 DIAGNOSIS — Z952 Presence of prosthetic heart valve: Secondary | ICD-10-CM

## 2018-11-24 LAB — POCT INR: INR: 1.6 — AB (ref 2.0–3.0)

## 2018-11-24 NOTE — Telephone Encounter (Signed)

## 2018-11-24 NOTE — Patient Instructions (Signed)
Description   Take 2 tablets today and then take 1.5 tablets tomorrow, then continue taking 1 tablet daily except for 1.5 tablets Sundays, Tuesdays and Thursdays. Recheck INR in one week. Call Coumadin Clinic with any concerns at [phone].

## 2018-12-01 ENCOUNTER — Other Ambulatory Visit: Payer: Self-pay

## 2018-12-01 ENCOUNTER — Ambulatory Visit (INDEPENDENT_AMBULATORY_CARE_PROVIDER_SITE_OTHER): Payer: PRIVATE HEALTH INSURANCE | Admitting: *Deleted

## 2018-12-01 DIAGNOSIS — Z5181 Encounter for therapeutic drug level monitoring: Secondary | ICD-10-CM | POA: Diagnosis not present

## 2018-12-01 DIAGNOSIS — Z952 Presence of prosthetic heart valve: Secondary | ICD-10-CM

## 2018-12-01 LAB — POCT INR: INR: 2.9 (ref 2.0–3.0)

## 2018-12-01 NOTE — Patient Instructions (Signed)
Description    Continue taking 1 tablet daily except for 1.5 tablets on Sundays, Tuesdays and Thursdays. Recheck INR in two weeks. Call Coumadin Clinic with any concerns at [phone].

## 2018-12-12 ENCOUNTER — Encounter: Payer: Self-pay | Admitting: Surgery

## 2018-12-13 ENCOUNTER — Telehealth: Payer: Self-pay

## 2018-12-13 NOTE — Telephone Encounter (Signed)
lmom for prescreen  

## 2018-12-15 ENCOUNTER — Other Ambulatory Visit: Payer: Self-pay

## 2018-12-15 ENCOUNTER — Ambulatory Visit (INDEPENDENT_AMBULATORY_CARE_PROVIDER_SITE_OTHER): Payer: PRIVATE HEALTH INSURANCE | Admitting: *Deleted

## 2018-12-15 DIAGNOSIS — Z952 Presence of prosthetic heart valve: Secondary | ICD-10-CM | POA: Diagnosis not present

## 2018-12-15 DIAGNOSIS — Z5181 Encounter for therapeutic drug level monitoring: Secondary | ICD-10-CM

## 2018-12-15 LAB — POCT INR: INR: 2.9 (ref 2.0–3.0)

## 2018-12-15 NOTE — Patient Instructions (Signed)
Description    Continue taking 1 tablet daily except for 1.5 tablets on Sundays, Tuesdays and Thursdays. Recheck INR in 3 weeks. Call Coumadin Clinic with any concerns at [phone].

## 2018-12-23 ENCOUNTER — Encounter: Payer: Self-pay | Admitting: Surgery

## 2018-12-27 ENCOUNTER — Ambulatory Visit (INDEPENDENT_AMBULATORY_CARE_PROVIDER_SITE_OTHER)
Admission: RE | Admit: 2018-12-27 | Discharge: 2018-12-27 | Disposition: A | Discharge status: Home / Self Care | Payer: BC Managed Care – PPO | Source: Ambulatory Visit

## 2018-12-27 DIAGNOSIS — B351 Tinea unguium: Secondary | ICD-10-CM | POA: Diagnosis not present

## 2018-12-27 DIAGNOSIS — L03011 Cellulitis of right finger: Secondary | ICD-10-CM | POA: Diagnosis not present

## 2018-12-27 MED ORDER — CEPHALEXIN 500 MG PO CAPS: 500.0000 mg | ORAL_CAPSULE | Freq: Two times a day (BID) | ORAL | 0 refills | Status: AC

## 2018-12-27 NOTE — Discharge Instructions (Addendum)
Wash daily with warm water and mild soap Doxycycline prescribed for possible bacterial infection Use OTC ibuprofen/tylenol as needed for pain  Follow up with dermatology for further evaluation and management, this may be a fungal infection that will require long term treatment Return or go to the ED if you have any new or worsening symptoms such as pain, nausea, vomiting, redness, swelling, fever, chills, nausea, vomiting etc..Marland Kitchen

## 2018-12-27 NOTE — ED Provider Notes (Signed)
Henry Duncan     Virtual Visit via Video Note:  Henry Duncan  initiated request for Telemedicine visit with [facility] team. I connected with Henry Duncan  on 12/27/2018 at 12:42 PM  for a synchronized telemedicine visit using a video enabled HIPPA compliant telemedicine application. I verified that I am speaking with Henry Duncan  using two identifiers. Lestine Box, PA-C  was physically located in a [facility] Urgent care site and Henry Duncan was located at a different location.   The limitations of evaluation and management by telemedicine as well as the availability of in-person appointments were discussed. Patient was informed that he  may incur a bill ( including co-pay) for this virtual visit encounter. Henry Duncan  expressed understanding and gave verbal consent to proceed with virtual visit.   [id number] 12/27/18 Arrival Time: 50  CC: RT thumb infection  SUBJECTIVE:  Henry Duncan is a 28 y.o. male hx significant for aortic aneurysm, who presents with possible right thumbnail infection for the past couple of months, with worsening symptoms over the past couple of days.  Denies precipitating event or trauma.  Localizes the symptoms to RT thumb.  Denies pain, or itching.  Has tried warm soaks without relief.  Denies aggravating factors.  Denies similar symptoms in the past.   Denies fever, chills, nausea, vomiting, discharge, SOB, chest pain, abdominal pain, changes in bowel or bladder function.    ROS: As per HPI.  All other pertinent ROS negative.     Past Medical History:  Diagnosis Date  . Anxiety    Past Surgical History:  Procedure Laterality Date  . BENTALL PROCEDURE N/A 09/19/2018   Procedure: BENTALL PROCEDURE WITH 23 Alex.;  Surgeon: Gaye Pollack, MD;  [facility];  Service: Open Heart Surgery;  Laterality: N/A;  . CLEFT LIP REPAIR    . SUBXYPHOID PERICARDIAL WINDOW  N/A 09/26/2018   Procedure: SUBXYPHOID PERICARDIAL WINDOW;  Surgeon: Gaye Pollack, MD;  Location: [facility] OR;  Service: Thoracic;  Laterality: N/A;  . TEE WITHOUT CARDIOVERSION N/A 09/19/2018   Procedure: TRANSESOPHAGEAL ECHOCARDIOGRAM (TEE);  Surgeon: Gaye Pollack, MD;  Location: [place];  Service: Open Heart Surgery;  Laterality: N/A;   No Known Allergies No current facility-administered medications on file prior to encounter.    Current Outpatient Medications on File Prior to Encounter  Medication Sig Dispense Refill  . acetaminophen (TYLENOL) 325 MG tablet Take 650 mg by mouth every 6 (six) hours as needed (for pain.).    Marland Kitchen cetirizine (ZYRTEC) 10 MG tablet Take 10 mg by mouth daily as needed for allergies.    . Metoprolol Tartrate 75 MG TABS Take 75 mg by mouth 2 (two) times daily. 60 tablet 1  . warfarin (COUMADIN) 5 MG tablet Take 1 tablet (5 mg total) by mouth one time only at 6 PM. As directed by the coumadin clinic 100 tablet 3    OBJECTIVE: There were no vitals filed for this visit.  General appearance: alert; no distress Eyes: EOMI grossly HENT: normocephalic; atraumatic Neck: supple with FROM Lungs: normal respiratory effort; speaking in full sentences without difficulty Extremities: moves extremities without difficulty Skin: RT thumb with white discoloration to nail, nailbed with possible erythema (see picture) Neurologic: normal facial expressions Psychological: alert and cooperative; normal mood and affect    ASSESSMENT & PLAN:  1. Infection of nail bed of finger of right hand   2. Fungal infection  of nail     Meds ordered this encounter  Medications  . cephALEXin (KEFLEX) 500 MG capsule    Sig: Take 1 capsule (500 mg total) by mouth 2 (two) times daily for 7 days.    Dispense:  14 capsule    Refill:  0    Order Specific Question:   Supervising Provider    Answer:   Raylene Everts [[id number]]   Wash daily with warm water and mild soap Keflex prescribed for  possible bacterial infection Use OTC ibuprofen/tylenol as needed for pain  Follow up with dermatology for further evaluation and management, this may be a fungal infection that will require long term treatment Return or go to the ED if you have any new or worsening symptoms such as pain, nausea, vomiting, redness, swelling, fever, chills, nausea, vomiting etc...   I discussed the assessment and treatment plan with the patient. The patient was provided an opportunity to ask questions and all were answered. The patient agreed with the plan and demonstrated an understanding of the instructions.   The patient was advised to call back or seek an in-person evaluation if the symptoms worsen or if the condition fails to improve as anticipated.  I provided 15 minutes of non-face-to-face time during this encounter.  Lestine Box, PA-C  12/27/2018 12:42 PM    Lestine Box, PA-C 12/27/18 1246

## 2019-01-05 ENCOUNTER — Ambulatory Visit (INDEPENDENT_AMBULATORY_CARE_PROVIDER_SITE_OTHER): Payer: BC Managed Care – PPO | Admitting: *Deleted

## 2019-01-05 ENCOUNTER — Other Ambulatory Visit: Payer: Self-pay

## 2019-01-05 DIAGNOSIS — Z5181 Encounter for therapeutic drug level monitoring: Secondary | ICD-10-CM | POA: Diagnosis not present

## 2019-01-05 DIAGNOSIS — L609 Nail disorder, unspecified: Secondary | ICD-10-CM | POA: Diagnosis not present

## 2019-01-05 DIAGNOSIS — D485 Neoplasm of uncertain behavior of skin: Secondary | ICD-10-CM | POA: Diagnosis not present

## 2019-01-05 DIAGNOSIS — L408 Other psoriasis: Secondary | ICD-10-CM | POA: Diagnosis not present

## 2019-01-05 DIAGNOSIS — Z952 Presence of prosthetic heart valve: Secondary | ICD-10-CM

## 2019-01-05 DIAGNOSIS — L603 Nail dystrophy: Secondary | ICD-10-CM | POA: Diagnosis not present

## 2019-01-05 LAB — POCT INR: INR: 4.9 — AB (ref 2.0–3.0)

## 2019-01-05 NOTE — Patient Instructions (Signed)
Description    Hold coumadin dose today and tomorrow, then continue taking 1 tablet daily except for 1.5 tablets on Sundays, Tuesdays and Thursdays. Recheck INR in 1 week. Call Coumadin Clinic with any concerns at [phone].

## 2019-01-13 ENCOUNTER — Ambulatory Visit (INDEPENDENT_AMBULATORY_CARE_PROVIDER_SITE_OTHER): Payer: BC Managed Care – PPO | Admitting: *Deleted

## 2019-01-13 ENCOUNTER — Other Ambulatory Visit: Payer: Self-pay

## 2019-01-13 DIAGNOSIS — Z952 Presence of prosthetic heart valve: Secondary | ICD-10-CM | POA: Diagnosis not present

## 2019-01-13 DIAGNOSIS — Z5181 Encounter for therapeutic drug level monitoring: Secondary | ICD-10-CM

## 2019-01-13 LAB — POCT INR: INR: 4 — AB (ref 2.0–3.0)

## 2019-01-13 NOTE — Patient Instructions (Signed)
Description    Hold coumadin dose today then start taking 1 tablet daily except for 1.5 tablets on Sundays and Thursdays. Recheck INR in 1 week. Call Coumadin Clinic with any concerns at [phone].

## 2019-01-23 ENCOUNTER — Ambulatory Visit (INDEPENDENT_AMBULATORY_CARE_PROVIDER_SITE_OTHER): Payer: BC Managed Care – PPO

## 2019-01-23 ENCOUNTER — Other Ambulatory Visit: Payer: Self-pay

## 2019-01-23 DIAGNOSIS — Z5181 Encounter for therapeutic drug level monitoring: Secondary | ICD-10-CM

## 2019-01-23 DIAGNOSIS — Z952 Presence of prosthetic heart valve: Secondary | ICD-10-CM

## 2019-01-23 LAB — POCT INR: INR: 2.7 (ref 2.0–3.0)

## 2019-01-23 NOTE — Patient Instructions (Signed)
Please continue dosage of 1 tablet daily except for 1.5 tablets on Sundays and Thursdays. Recheck INR in 2 weeks. Call Coumadin Clinic with any concerns at [phone].

## 2019-01-24 ENCOUNTER — Encounter: Payer: Self-pay | Admitting: Cardiovascular Disease

## 2019-01-24 ENCOUNTER — Ambulatory Visit (INDEPENDENT_AMBULATORY_CARE_PROVIDER_SITE_OTHER): Payer: BC Managed Care – PPO | Admitting: Cardiovascular Disease

## 2019-01-24 VITALS — BP 129/84 | HR 92 | Temp 97.6°F | Ht 67.0 in | Wt 188.0 lb

## 2019-01-24 DIAGNOSIS — Z7901 Long term (current) use of anticoagulants: Secondary | ICD-10-CM | POA: Diagnosis not present

## 2019-01-24 DIAGNOSIS — I712 Thoracic aortic aneurysm, without rupture, unspecified: Secondary | ICD-10-CM

## 2019-01-24 DIAGNOSIS — Z9889 Other specified postprocedural states: Secondary | ICD-10-CM

## 2019-01-24 DIAGNOSIS — Z8679 Personal history of other diseases of the circulatory system: Secondary | ICD-10-CM

## 2019-01-24 DIAGNOSIS — Z952 Presence of prosthetic heart valve: Secondary | ICD-10-CM | POA: Diagnosis not present

## 2019-01-24 NOTE — Progress Notes (Signed)
SUBJECTIVE: The patient presents for routine follow-up.  He underwent surgical repair for dissection of the anteromedial ascending thoracic aorta on 09/19/2018.  He is status post Bentall procedure with a 23 mm St. Jude mechanical valve.  He had a pericardial effusion postoperatively and underwent a pericardial window.  The patient denies any symptoms of chest pain, palpitations, shortness of breath, lightheadedness, dizziness, leg swelling, orthopnea, PND, and syncope.  He is back to work.  He has questions about riding roller coasters and what type of diet is best.   Social history: He works in a [employer], [employer] in [place].   Review of Systems: As per "subjective", otherwise negative.  No Known Allergies  Current Outpatient Medications  Medication Sig Dispense Refill  . acetaminophen (TYLENOL) 325 MG tablet Take 650 mg by mouth every 6 (six) hours as needed (for pain.).    Marland Kitchen cetirizine (ZYRTEC) 10 MG tablet Take 10 mg by mouth daily as needed for allergies.    . Metoprolol Tartrate 75 MG TABS Take 75 mg by mouth 2 (two) times daily. 60 tablet 1  . warfarin (COUMADIN) 5 MG tablet Take 1 tablet (5 mg total) by mouth one time only at 6 PM. As directed by the coumadin clinic 100 tablet 3   No current facility-administered medications for this visit.     Past Medical History:  Diagnosis Date  . Anxiety     Past Surgical History:  Procedure Laterality Date  . BENTALL PROCEDURE N/A 09/19/2018   Procedure: BENTALL PROCEDURE WITH 23 [place].;  Surgeon: Gaye Pollack, MD;  [facility];  Service: Open Heart Surgery;  Laterality: N/A;  . CLEFT LIP REPAIR    . SUBXYPHOID PERICARDIAL WINDOW N/A 09/26/2018   Procedure: SUBXYPHOID PERICARDIAL WINDOW;  Surgeon: Gaye Pollack, MD;  [facility];  Service: Thoracic;  Laterality: N/A;  . TEE WITHOUT CARDIOVERSION N/A 09/19/2018   Procedure: TRANSESOPHAGEAL ECHOCARDIOGRAM (TEE);  Surgeon: Gaye Pollack, MD;  Location:  [place];  Service: Open Heart Surgery;  Laterality: N/A;    Social History   Socioeconomic History  . Marital status: Single    Spouse name: Not on file  . Number of children: Not on file  . Years of education: Not on file  . Highest education level: Not on file  Occupational History  . Not on file  Social Needs  . Financial resource strain: Not on file  . Food insecurity    Worry: Not on file    Inability: Not on file  . Transportation needs    Medical: Not on file    Non-medical: Not on file  Tobacco Use  . Smoking status: Never Smoker  . Smokeless tobacco: Never Used  Substance and Sexual Activity  . Alcohol use: No    Alcohol/week: 0.0 standard drinks    Comment: pt states on occasion  . Drug use: No  . Sexual activity: Yes    Birth control/protection: Condom  Lifestyle  . Physical activity    Days per week: Not on file    Minutes per session: Not on file  . Stress: Not on file  Relationships  . Social Herbalist on phone: Not on file    Gets together: Not on file    Attends religious service: Not on file    Active member of club or organization: Not on file    Attends meetings of clubs or organizations: Not on file  Relationship status: Not on file  . Intimate partner violence    Fear of current or ex partner: Not on file    Emotionally abused: Not on file    Physically abused: Not on file    Forced sexual activity: Not on file  Other Topics Concern  . Not on file  Social History Narrative  . Not on file     Vitals:   01/24/19 1126  BP: 129/84  Pulse: 92  Temp: 97.6 F (36.4 C)  SpO2: 98%  Weight: 188 lb (85.3 kg)  Height: 5\' 7"  (1.702 m)    Wt Readings from Last 3 Encounters:  01/24/19 188 lb (85.3 kg)  10/07/18 180 lb (81.6 kg)  09/27/18 173 lb 1.6 oz (78.5 kg)     PHYSICAL EXAM General: NAD HEENT: Normal. Neck: No JVD, no thyromegaly. Lungs: Clear to auscultation bilaterally with normal respiratory effort. CV: Regular  rate and rhythm, normal [id number] S2 click, no [id number], no murmur. No pretibial or periankle edema.    Abdomen: Soft, nontender, no distention.  Neurologic: Alert and oriented.  Psych: Normal affect. Skin: Normal. Musculoskeletal: No gross deformities.    ECG: Reviewed above under Subjective   Labs: Lab Results  Component Value Date/Time   K 2.7 (LL) 09/26/2018 04:08 AM   K 3.4 (L) 06/30/2013 09:27 PM   BUN 10 09/26/2018 04:08 AM   BUN 15 10/28/2016 01:48 PM   BUN 8 06/30/2013 09:27 PM   CREATININE 0.82 09/26/2018 04:08 AM   CREATININE 0.96 06/30/2013 09:27 PM   ALT 69 (H) 09/16/2018 03:26 PM   HGB 9.8 (L) 09/26/2018 04:08 AM   HGB 15.1 10/28/2016 01:48 PM     Lipids: No results found for: LDLCALC, LDLDIRECT, CHOL, TRIG, HDL     ASSESSMENT AND PLAN:  1.  Thoracic aortic dissection: He underwent surgical repair for dissection of the anteromedial ascending thoracic aorta on 09/19/2018.  He is status post Bentall procedure with a 23 mm St. Jude mechanical valve.  He had a pericardial effusion postoperatively and underwent a pericardial window.  Symptomatically stable.  Continue Lopressor 75 mg twice daily.  He is on warfarin.  INR is monitored in [place] as this is closer to his workplace.     Disposition: Follow up 1 year   Kate Sable, M.D., F.A.C.C.

## 2019-01-24 NOTE — Patient Instructions (Signed)
Medication Instructions: Your physician recommends that you continue on your current medications as directed. Please refer to the Current Medication list given to you today.   Labwork: None  Procedures/Testing: None  Follow-Up: 1 year with Dr.Koneswaran  Any Additional Special Instructions Will Be Listed Below (If Applicable).     If you need a refill on your cardiac medications before your next appointment, please call your pharmacy.

## 2019-02-04 DIAGNOSIS — Z20828 Contact with and (suspected) exposure to other viral communicable diseases: Secondary | ICD-10-CM | POA: Diagnosis not present

## 2019-02-06 ENCOUNTER — Ambulatory Visit (INDEPENDENT_AMBULATORY_CARE_PROVIDER_SITE_OTHER): Payer: BC Managed Care – PPO

## 2019-02-06 ENCOUNTER — Other Ambulatory Visit: Payer: Self-pay

## 2019-02-06 DIAGNOSIS — Z5181 Encounter for therapeutic drug level monitoring: Secondary | ICD-10-CM | POA: Diagnosis not present

## 2019-02-06 DIAGNOSIS — Z952 Presence of prosthetic heart valve: Secondary | ICD-10-CM

## 2019-02-06 LAB — POCT INR: INR: 2.6 (ref 2.0–3.0)

## 2019-02-06 NOTE — Patient Instructions (Signed)
Please continue dosage of 1 tablet daily except for 1.5 tablets on Sundays and Thursdays. Recheck INR in 4 weeks. Call Coumadin Clinic with any concerns at [phone].

## 2019-02-10 DIAGNOSIS — R262 Difficulty in walking, not elsewhere classified: Secondary | ICD-10-CM | POA: Diagnosis not present

## 2019-02-10 DIAGNOSIS — L600 Ingrowing nail: Secondary | ICD-10-CM | POA: Diagnosis not present

## 2019-02-10 DIAGNOSIS — M79671 Pain in right foot: Secondary | ICD-10-CM | POA: Diagnosis not present

## 2019-02-11 ENCOUNTER — Ambulatory Visit
Admission: EM | Admit: 2019-02-11 | Discharge: 2019-02-11 | Disposition: A | Discharge status: Home / Self Care | Payer: BC Managed Care – PPO

## 2019-02-11 ENCOUNTER — Other Ambulatory Visit: Payer: Self-pay

## 2019-02-11 ENCOUNTER — Encounter: Payer: Self-pay | Admitting: Emergency Medicine

## 2019-02-11 DIAGNOSIS — Z0489 Encounter for examination and observation for other specified reasons: Secondary | ICD-10-CM | POA: Diagnosis not present

## 2019-02-11 DIAGNOSIS — Z20828 Contact with and (suspected) exposure to other viral communicable diseases: Secondary | ICD-10-CM | POA: Diagnosis not present

## 2019-02-11 DIAGNOSIS — Z0289 Encounter for other administrative examinations: Secondary | ICD-10-CM

## 2019-02-11 DIAGNOSIS — Z7689 Persons encountering health services in other specified circumstances: Secondary | ICD-10-CM | POA: Diagnosis not present

## 2019-02-11 DIAGNOSIS — Z7189 Other specified counseling: Secondary | ICD-10-CM

## 2019-02-11 NOTE — Discharge Instructions (Addendum)
You were tested at [facility] for SARS-CoV-2 (novel coronavirus) on 02/04/2019 and your results have been reviewed as NEGATIVE. You indicated that you have no symptoms at this time. You have not been around anyone known to be ill since your testing was performed. Your exam today was grossly normal. You should be safe to return to work at this point. I have provided the appropriate documentation for your employer.  Recommend wearing mask and continuing social distancing per [facility] DHHS guidelines.   Honor Loh, MSN, APRN, FNP-C, CEN Advanced Practice Provider [facility]

## 2019-02-11 NOTE — ED Provider Notes (Signed)
[place]   Name: Henry Duncan DOB: 1990-11-13 MRN: [id number] CSN: [id number] PCP: Glean Hess, MD  Arrival date and time:  02/11/19 1409  Chief Complaint:  Letter for School/Work   NOTE: Prior to seeing the patient today, I have reviewed the triage nursing documentation and vital signs. Clinical staff has updated patient's PMH/PSHx, current medication list, and drug allergies/intolerances to ensure comprehensive history available to assist in medical decision making.   History:   HPI: Henry Duncan is a 28 y.o. male who presents today with requests for return to work clearance. Patient was exposed to a person who tested (+) for SARS-CoV-2 (novel coronavirus) on 02/03/2019 at his job at [employer]. His job made the decision to quarantine him and 4 other exposed employees at home for a period of 14 days. [facility] at [facility] and his results were negative. Patient presents today with no symptoms; no cough, fevers, or other symptoms commonly associated with SARS-CoV-2. He advises that he feels generally well. Patient presents for testing as he is being required to provide documentation of negative test results and medical provider clearance before he will be allowed to return to work.   Past Medical History:  Diagnosis Date  . Anxiety     Past Surgical History:  Procedure Laterality Date  . BENTALL PROCEDURE N/A 09/19/2018   Procedure: BENTALL PROCEDURE WITH 23 [place].;  Surgeon: Gaye Pollack, MD;  [facility];  Service: Open Heart Surgery;  Laterality: N/A;  . CARDIAC SURGERY    . CLEFT LIP REPAIR    . SUBXYPHOID PERICARDIAL WINDOW N/A 09/26/2018   Procedure: SUBXYPHOID PERICARDIAL WINDOW;  Surgeon: Gaye Pollack, MD;  [facility];  Service: Thoracic;  Laterality: N/A;  . TEE WITHOUT CARDIOVERSION N/A 09/19/2018   Procedure: TRANSESOPHAGEAL ECHOCARDIOGRAM (TEE);  Surgeon: Gaye Pollack, MD;  Location: [place];  Service: Open Heart Surgery;  Laterality: N/A;    Family History  Problem Relation Age of Onset  . Heart attack Mother   . Anxiety disorder Father   . Diabetes Father   . Depression Brother   . Anxiety disorder Brother     Social History   Tobacco Use  . Smoking status: Never Smoker  . Smokeless tobacco: Never Used  Substance Use Topics  . Alcohol use: No    Alcohol/week: 0.0 standard drinks    Comment: pt states on occasion  . Drug use: No    Patient Active Problem List   Diagnosis Date Noted  . Encounter for therapeutic drug monitoring 09/29/2018  . Aortic valve replaced 09/29/2018  . S/P ascending aortic aneurysm repair 09/19/2018  . Aortic aneurysm ([place]) 09/16/2018  . Allergic rhinitis 09/06/2018  . Abdominal pain 10/30/2016  . Elevated ALT measurement 10/30/2016  . Insomnia 11/29/2015  . Migraine 11/29/2015  . GERD (gastroesophageal reflux disease) 02/11/2015  . Acute anxiety 02/11/2015  . TENDINITIS TIBIALIS 10/24/2007    Home Medications:    Current Meds  Medication Sig  . Metoprolol Tartrate 75 MG TABS Take 75 mg by mouth 2 (two) times daily.  Marland Kitchen warfarin (COUMADIN) 5 MG tablet Take 1 tablet (5 mg total) by mouth one time only at 6 PM. As directed by the coumadin clinic    Allergies:   Patient has no known allergies.  Review of Systems (ROS): Review of Systems  Constitutional: Negative for fatigue and fever.  HENT: Negative for congestion, ear pain, postnasal drip, rhinorrhea,  sinus pressure, sinus pain, sneezing and sore throat.   Eyes: Negative for pain, discharge and redness.  Respiratory: Negative for cough, chest tightness and shortness of breath.   Cardiovascular: Negative for chest pain and palpitations.  Gastrointestinal: Negative for abdominal pain, diarrhea, nausea and vomiting.  Musculoskeletal: Negative for arthralgias, back pain, myalgias and neck pain.  Skin: Negative for color change, pallor and rash.  Neurological: Negative for  dizziness, syncope, weakness and headaches.  Hematological: Negative for adenopathy.     Vital Signs: Today's Vitals   02/11/19 1417 02/11/19 1418  BP:  (!) 152/90  Pulse:  77  Resp:  16  Temp:  98.3 F (36.8 C)  SpO2:  99%  Weight: 180 lb (81.6 kg)   Height: 5\' 7"  (1.702 m)   PainSc: 0-No pain     Physical Exam: Physical Exam  Constitutional: He is oriented to person, place, and time and well-developed, well-nourished, and in no distress. No distress.  HENT:  Head: Normocephalic and atraumatic.  Nose: Nose normal.  Mouth/Throat: Oropharynx is clear and moist.  Eyes: Pupils are equal, round, and reactive to light. Conjunctivae and EOM are normal.  Neck: Normal range of motion. Neck supple. No tracheal deviation present.  Cardiovascular: Normal rate, regular rhythm, normal heart sounds and intact distal pulses.  Pulmonary/Chest: Effort normal and breath sounds normal. No respiratory distress. He has no wheezes. He has no rales.  Abdominal: Soft. Bowel sounds are normal. He exhibits no distension. There is no abdominal tenderness.  Musculoskeletal: Normal range of motion.  Lymphadenopathy:    He has no cervical adenopathy.  Neurological: He is alert and oriented to person, place, and time. Gait normal.  Skin: Skin is warm and dry. No rash noted. He is not diaphoretic.  Psychiatric: Mood, memory, affect and judgment normal.  Nursing note and vitals reviewed.   Urgent Care Treatments / Results:   LABS: PLEASE NOTE: all labs that were ordered this encounter are listed, however only abnormal results are displayed. Labs Reviewed - No data to display  EKG: -None  RADIOLOGY: No results found.  PROCEDURES: Procedures  MEDICATIONS RECEIVED THIS VISIT: Medications - No data to display  PERTINENT CLINICAL COURSE NOTES/UPDATES:   Initial Impression / Assessment and Plan / Urgent Care Course:  Pertinent labs & imaging results that were available during my care of the  patient were personally reviewed by me and considered in my medical decision making (see lab/imaging section of note for values and interpretations).  Henry Duncan is a 28 y.o. male who presents to [facility] today with complaints of Letter for School/Work  Patient overall well appearing and in no acute distress today in clinic. Presenting symptoms (see HPI) and exam as documented above. He presents following exposed to SARS-CoV-2 (novel coronavirus) on 02/03/2019. [facility] and was negative for the virus. Patient has been on mandatory quarantine at home for the last 14 days. Patient has no symptoms and feels generally well. He has not been in close contact with anyone known to be ill since his testing. Exam is grossly unremarkable. There is nothing that precludes patient from returning to work at this point with no restrictions. Documentation for his employer was provided. Recommend continued masking and social distancing per [facility] DHHS guidelines.   Discussed follow up with primary care physician should he develop any concerning symptoms. I have reviewed the follow up and strict return precautions for any new or worsening symptoms. Patient is aware  of symptoms that would be deemed urgent/emergent, and would thus require further evaluation either here or in the emergency department. At the time of discharge, he verbalized understanding and consent with the discharge plan as it was reviewed with him. All questions were fielded by provider and/or clinic staff prior to patient discharge.   Final Clinical Impressions / Urgent Care Diagnoses:   Final diagnoses:  Return to work evaluation  Advice Given About Covid-19 Virus Infection    New Prescriptions:  [place] Controlled Substance Registry consulted? Not Applicable  No orders of the defined types were placed in this encounter.   Recommended Follow up Care:  Patient encouraged to follow up with the following  provider within the specified time frame, or sooner as dictated by the severity of his symptoms. As always, he was instructed that for any urgent/emergent care needs, he should seek care either here or in the emergency department for more immediate evaluation.  Follow-up Information    Glean Hess, MD.   Specialty: Internal Medicine Why: As needed if any symptoms develop. Contact information: [address] [place] [address] [phone]         NOTE: This note was prepared using Dragon dictation software along with smaller phrase technology. Despite my best ability to proofread, there is the potential that transcriptional errors may still occur from this process, and are completely unintentional.     Karen Kitchens, NP 02/11/19 1446

## 2019-02-11 NOTE — ED Triage Notes (Signed)
Patient states that his COVID test came back Negative that was done on 02/04/19 at the [facility].  Patient states that he needs a note to be able to return to work.

## 2019-02-12 ENCOUNTER — Other Ambulatory Visit: Payer: Self-pay | Admitting: Physician Assistant

## 2019-02-13 ENCOUNTER — Other Ambulatory Visit: Payer: Self-pay | Admitting: Physician Assistant

## 2019-02-14 ENCOUNTER — Ambulatory Visit: Payer: BLUE CROSS/BLUE SHIELD | Admitting: Internal Medicine

## 2019-02-17 ENCOUNTER — Ambulatory Visit: Payer: Self-pay | Admitting: Podiatry

## 2019-03-06 ENCOUNTER — Other Ambulatory Visit: Payer: Self-pay

## 2019-03-06 ENCOUNTER — Ambulatory Visit (INDEPENDENT_AMBULATORY_CARE_PROVIDER_SITE_OTHER): Payer: BC Managed Care – PPO

## 2019-03-06 DIAGNOSIS — Z5181 Encounter for therapeutic drug level monitoring: Secondary | ICD-10-CM | POA: Diagnosis not present

## 2019-03-06 DIAGNOSIS — Z952 Presence of prosthetic heart valve: Secondary | ICD-10-CM | POA: Diagnosis not present

## 2019-03-06 LAB — POCT INR: INR: 2 (ref 2.0–3.0)

## 2019-03-06 NOTE — Patient Instructions (Signed)
Please take 1.5 tablets tonight, then continue dosage of 1 tablet daily except for 1.5 tablets on Sundays and Thursdays.  Recheck INR in 3 weeks. Call Coumadin Clinic with any concerns at [phone].

## 2019-03-15 ENCOUNTER — Other Ambulatory Visit: Payer: Self-pay

## 2019-03-15 ENCOUNTER — Encounter: Payer: Self-pay | Admitting: Internal Medicine

## 2019-03-15 ENCOUNTER — Ambulatory Visit (INDEPENDENT_AMBULATORY_CARE_PROVIDER_SITE_OTHER): Payer: BC Managed Care – PPO | Admitting: Internal Medicine

## 2019-03-15 VITALS — BP 112/68 | HR 99 | Ht 67.0 in | Wt 189.0 lb

## 2019-03-15 DIAGNOSIS — Z952 Presence of prosthetic heart valve: Secondary | ICD-10-CM | POA: Diagnosis not present

## 2019-03-15 DIAGNOSIS — Z7901 Long term (current) use of anticoagulants: Secondary | ICD-10-CM | POA: Diagnosis not present

## 2019-03-15 DIAGNOSIS — L409 Psoriasis, unspecified: Secondary | ICD-10-CM | POA: Diagnosis not present

## 2019-03-15 DIAGNOSIS — G479 Sleep disorder, unspecified: Secondary | ICD-10-CM

## 2019-03-15 MED ORDER — TRIAMCINOLONE ACETONIDE 0.025 % EX CREA: 1.0000 "application " | TOPICAL_CREAM | Freq: Two times a day (BID) | CUTANEOUS | 0 refills | Status: DC

## 2019-03-15 NOTE — Progress Notes (Signed)
Date:  03/15/2019   Name:  Henry Duncan   DOB:  [birth year]   MRN:  [id number]   Chief Complaint: Establish Care (Declined flu shot.) and Snoring (Snoring bad in sleep. Girlfriend says he stops breathing and catches breathe when sleeping. Does not sleep well. Dad, brother, and grandma have sleep apnea.)  HPI Sleep issues - for some time he has been aware that he snores.  His fiancee has noticed that he often stops breathing then gasps for air during the night.  He admits to morning fatigue and daytime somnolence.  He notes that 3 of his close relatives have sleep apnea.  Review of Systems  HENT: Negative for trouble swallowing.   Respiratory: Negative for choking, shortness of breath and wheezing.   Gastrointestinal: Negative for abdominal pain, blood in stool, constipation and diarrhea.  Genitourinary: Negative for difficulty urinating and hematuria.  Musculoskeletal: Negative for arthralgias and gait problem.  Neurological: Negative for dizziness, light-headedness and headaches.  Hematological: Does not bruise/bleed easily.    Patient Active Problem List   Diagnosis Date Noted  . Encounter for therapeutic drug monitoring 09/29/2018  . Aortic valve replaced 09/29/2018  . S/P ascending aortic aneurysm repair 09/19/2018  . Allergic rhinitis 09/06/2018  . Elevated ALT measurement 10/30/2016  . Insomnia 11/29/2015  . Migraine 11/29/2015  . GERD (gastroesophageal reflux disease) 02/11/2015  . Acute anxiety 02/11/2015    No Known Allergies  Past Surgical History:  Procedure Laterality Date  . BENTALL PROCEDURE N/A 09/19/2018   Procedure: BENTALL PROCEDURE WITH 23 [place].;  Surgeon: Gaye Pollack, MD;  Location: [facility];  Service: Open Heart Surgery;  Laterality: N/A;  . CARDIAC SURGERY    . CLEFT LIP REPAIR    . SUBXYPHOID PERICARDIAL WINDOW N/A 09/26/2018   Procedure: SUBXYPHOID PERICARDIAL WINDOW;  Surgeon: Gaye Pollack, MD;  Location: [facility] OR;   Service: Thoracic;  Laterality: N/A;  . TEE WITHOUT CARDIOVERSION N/A 09/19/2018   Procedure: TRANSESOPHAGEAL ECHOCARDIOGRAM (TEE);  Surgeon: Gaye Pollack, MD;  Location: [facility];  Service: Open Heart Surgery;  Laterality: N/A;    Social History   Tobacco Use  . Smoking status: Never Smoker  . Smokeless tobacco: Never Used  Substance Use Topics  . Alcohol use: No    Alcohol/week: 0.0 standard drinks    Comment: pt states on occasion  . Drug use: No     Medication list has been reviewed and updated.  Current Meds  Medication Sig  . acetaminophen (TYLENOL) 325 MG tablet Take 650 mg by mouth every 6 (six) hours as needed (for pain.).  Marland Kitchen Metoprolol Tartrate 75 MG TABS Take 75 mg by mouth 2 (two) times daily.  Marland Kitchen triamcinolone ointment (KENALOG) 0.1 % Apply topically 2 (two) times daily.  Marland Kitchen warfarin (COUMADIN) 5 MG tablet Take 1 tablet (5 mg total) by mouth one time only at 6 PM. As directed by the coumadin clinic    PHQ 2/9 Scores 03/15/2019 02/11/2015  PHQ - 2 Score 0 0    BP Readings from Last 3 Encounters:  03/15/19 112/68  02/11/19 (!) 152/90  01/24/19 129/84    Physical Exam Vitals signs and nursing note reviewed.  Constitutional:      General: He is not in acute distress.    Appearance: Normal appearance. He is well-developed.  HENT:     Head: Normocephalic and atraumatic.  Neck:     Musculoskeletal: Normal range of motion and neck supple.  Vascular: No carotid bruit.  Cardiovascular:     Rate and Rhythm: Normal rate and regular rhythm.  No extrasystoles are present.    Pulses:          Radial pulses are 2+ on the right side and 2+ on the left side.     Comments: Loud mechanical valve click throughout the precordium and mid back Pulmonary:     Effort: Pulmonary effort is normal. No respiratory distress.     Breath sounds: Normal breath sounds. No decreased breath sounds or rhonchi.  Musculoskeletal: Normal range of motion.  Lymphadenopathy:     Cervical:  No cervical adenopathy.  Skin:    General: Skin is warm and dry.     Findings: No rash.     Comments: Scattered white plaques on legs, back and arm.  Pt also states similar lesions on scrotum and tip of his penis.  Neurological:     Mental Status: He is alert and oriented to person, place, and time.  Psychiatric:        Attention and Perception: Attention normal.        Mood and Affect: Mood normal.        Behavior: Behavior normal.        Thought Content: Thought content normal.     Wt Readings from Last 3 Encounters:  03/15/19 189 lb (85.7 kg)  02/11/19 180 lb (81.6 kg)  01/24/19 188 lb (85.3 kg)    BP 112/68   Pulse 99   Ht 5\' 7"  (1.702 m)   Wt 189 lb (85.7 kg)   SpO2 97%   BMI 29.60 kg/m   Assessment and Plan: 1. Disordered sleep Will request home sleep study and proceed according to the findings Results of the Epworth flowsheet 03/15/2019  Sitting and reading 3  Watching TV 2  Sitting, inactive in a public place (e.g. a theatre or a meeting) 1  As a passenger in a car for an hour without a break 0  Lying down to rest in the afternoon when circumstances permit 2  Sitting and talking to someone 0  Sitting quietly after a lunch without alcohol 2  In a car, while stopped for a few minutes in traffic 1  Total score 11   - Home sleep test  2. Psoriasis Will give low dose steroid to use on genital area - pt cautioned if not improving will need to follow up with Dermatology - triamcinolone (KENALOG) 0.025 % cream; Apply 1 application topically 2 (two) times daily. To genitals  Dispense: 30 g; Refill: 0  3. Psoriasis of nail Improving with higher dose kenalog ointment  4. Warfarin anticoagulation Pt will be on life long anticoagulation for his St. Jude aortic valve PT/INR followed by [facility] Last INR 2.0 with dosage change made   Partially dictated using Editor, commissioning. Any errors are unintentional.  Halina Maidens, MD [facility]  03/15/2019

## 2019-03-27 ENCOUNTER — Other Ambulatory Visit: Payer: Self-pay

## 2019-03-27 ENCOUNTER — Ambulatory Visit (INDEPENDENT_AMBULATORY_CARE_PROVIDER_SITE_OTHER): Payer: BC Managed Care – PPO

## 2019-03-27 DIAGNOSIS — Z5181 Encounter for therapeutic drug level monitoring: Secondary | ICD-10-CM | POA: Diagnosis not present

## 2019-03-27 DIAGNOSIS — Z952 Presence of prosthetic heart valve: Secondary | ICD-10-CM

## 2019-03-27 LAB — POCT INR: INR: 1.4 — AB (ref 2.0–3.0)

## 2019-03-27 NOTE — Patient Instructions (Signed)
Please take 2 tablets tonight, then START NEW DOSAGE of 1 tablet daily except for 1.5 tablets on Sundays, Tuesdays & Thursdays.  Recheck INR in 2 weeks. Call Coumadin Clinic with any concerns at [phone].

## 2019-04-03 ENCOUNTER — Encounter: Payer: Self-pay | Admitting: Internal Medicine

## 2019-04-06 ENCOUNTER — Other Ambulatory Visit: Payer: Self-pay | Admitting: Physician Assistant

## 2019-04-07 ENCOUNTER — Other Ambulatory Visit: Payer: Self-pay | Admitting: Physician Assistant

## 2019-04-10 ENCOUNTER — Other Ambulatory Visit: Payer: Self-pay

## 2019-04-10 ENCOUNTER — Ambulatory Visit (INDEPENDENT_AMBULATORY_CARE_PROVIDER_SITE_OTHER): Payer: BC Managed Care – PPO

## 2019-04-10 DIAGNOSIS — Z5181 Encounter for therapeutic drug level monitoring: Secondary | ICD-10-CM | POA: Diagnosis not present

## 2019-04-10 DIAGNOSIS — Z952 Presence of prosthetic heart valve: Secondary | ICD-10-CM | POA: Diagnosis not present

## 2019-04-10 LAB — POCT INR: INR: 1.1 — AB (ref 2.0–3.0)

## 2019-04-10 NOTE — Patient Instructions (Signed)
Please take 2 tablets tonight, then START NEW DOSAGE of 1.5 tablet daily except for 1 tablet on [place].  Recheck INR in 2 weeks. Call Coumadin Clinic with any concerns at [phone].

## 2019-04-11 ENCOUNTER — Other Ambulatory Visit: Payer: Self-pay | Admitting: Physician Assistant

## 2019-04-11 ENCOUNTER — Telehealth: Payer: Self-pay

## 2019-04-11 MED ORDER — METOPROLOL TARTRATE 75 MG PO TABS: 75.0000 mg | ORAL_TABLET | Freq: Two times a day (BID) | ORAL | 1 refills | Status: DC

## 2019-04-11 NOTE — Telephone Encounter (Signed)
Refilled metoprolol. Sen to [facility] in [place].

## 2019-04-20 ENCOUNTER — Encounter: Payer: Self-pay | Admitting: Internal Medicine

## 2019-04-20 ENCOUNTER — Other Ambulatory Visit: Payer: Self-pay | Admitting: Internal Medicine

## 2019-04-20 DIAGNOSIS — G479 Sleep disorder, unspecified: Secondary | ICD-10-CM

## 2019-04-24 ENCOUNTER — Ambulatory Visit (INDEPENDENT_AMBULATORY_CARE_PROVIDER_SITE_OTHER): Payer: BC Managed Care – PPO

## 2019-04-24 ENCOUNTER — Other Ambulatory Visit: Payer: Self-pay

## 2019-04-24 DIAGNOSIS — Z5181 Encounter for therapeutic drug level monitoring: Secondary | ICD-10-CM | POA: Diagnosis not present

## 2019-04-24 DIAGNOSIS — Z952 Presence of prosthetic heart valve: Secondary | ICD-10-CM

## 2019-04-24 LAB — POCT INR: INR: 5.3 — AB (ref 2.0–3.0)

## 2019-04-24 NOTE — Patient Instructions (Signed)
Please skip warfarin tonight & tomorrow, then START NEW DOSAGE of 1.5 tablet daily except for 1 tablet on MONDAYS, [place].  Recheck INR in 2 weeks. Call Coumadin Clinic with any concerns at [phone].

## 2019-05-03 ENCOUNTER — Ambulatory Visit (INDEPENDENT_AMBULATORY_CARE_PROVIDER_SITE_OTHER): Payer: BC Managed Care – PPO

## 2019-05-03 ENCOUNTER — Other Ambulatory Visit: Payer: Self-pay

## 2019-05-03 DIAGNOSIS — Z5181 Encounter for therapeutic drug level monitoring: Secondary | ICD-10-CM | POA: Diagnosis not present

## 2019-05-03 DIAGNOSIS — Z952 Presence of prosthetic heart valve: Secondary | ICD-10-CM | POA: Diagnosis not present

## 2019-05-03 LAB — POCT INR: INR: 2.3 (ref 2.0–3.0)

## 2019-05-03 NOTE — Patient Instructions (Signed)
Please take 1.5 tablets tonight, then resume dosage of 1.5 tablet daily except for 1 tablet on MONDAYS, [place].  Recheck INR in 4 weeks. Call Coumadin Clinic with any concerns at [phone].

## 2019-05-22 DIAGNOSIS — R0681 Apnea, not elsewhere classified: Secondary | ICD-10-CM | POA: Diagnosis not present

## 2019-05-28 ENCOUNTER — Other Ambulatory Visit: Payer: Self-pay | Admitting: Cardiovascular Disease

## 2019-05-31 ENCOUNTER — Ambulatory Visit (INDEPENDENT_AMBULATORY_CARE_PROVIDER_SITE_OTHER): Payer: BLUE CROSS/BLUE SHIELD

## 2019-05-31 ENCOUNTER — Other Ambulatory Visit: Payer: Self-pay

## 2019-05-31 DIAGNOSIS — Z5181 Encounter for therapeutic drug level monitoring: Secondary | ICD-10-CM | POA: Diagnosis not present

## 2019-05-31 DIAGNOSIS — Z952 Presence of prosthetic heart valve: Secondary | ICD-10-CM | POA: Diagnosis not present

## 2019-05-31 LAB — POCT INR: INR: 1.3 — AB (ref 2.0–3.0)

## 2019-05-31 NOTE — Patient Instructions (Signed)
Please take 2 tablets tonight, then resume dosage of 1.5 tablet daily except for 1 tablet on MONDAYS, [place].  Recheck INR in 4 weeks.

## 2019-06-12 DIAGNOSIS — G4733 Obstructive sleep apnea (adult) (pediatric): Secondary | ICD-10-CM | POA: Diagnosis not present

## 2019-06-15 DIAGNOSIS — G4733 Obstructive sleep apnea (adult) (pediatric): Secondary | ICD-10-CM | POA: Diagnosis not present

## 2019-07-05 ENCOUNTER — Ambulatory Visit (INDEPENDENT_AMBULATORY_CARE_PROVIDER_SITE_OTHER): Payer: BC Managed Care – PPO

## 2019-07-05 ENCOUNTER — Other Ambulatory Visit: Payer: Self-pay

## 2019-07-05 DIAGNOSIS — Z952 Presence of prosthetic heart valve: Secondary | ICD-10-CM

## 2019-07-05 DIAGNOSIS — Z5181 Encounter for therapeutic drug level monitoring: Secondary | ICD-10-CM | POA: Diagnosis not present

## 2019-07-05 LAB — POCT INR: INR: 1.3 — AB (ref 2.0–3.0)

## 2019-07-05 NOTE — Patient Instructions (Signed)
Please take 2 tablets tonight & tomorrow, then resume dosage of 1.5 tablet daily except for 1 tablet on MONDAYS, [place].  Recheck INR in 4 weeks.

## 2019-07-12 ENCOUNTER — Other Ambulatory Visit: Payer: Self-pay | Admitting: Cardiovascular Disease

## 2019-07-12 MED ORDER — METOPROLOL SUCCINATE ER 50 MG PO TB24: ORAL_TABLET | ORAL | 1 refills | Status: DC

## 2019-07-13 ENCOUNTER — Other Ambulatory Visit: Payer: Self-pay | Admitting: *Deleted

## 2019-07-13 ENCOUNTER — Other Ambulatory Visit: Payer: Self-pay | Admitting: Cardiovascular Disease

## 2019-07-13 MED ORDER — METOPROLOL SUCCINATE ER 50 MG PO TB24: ORAL_TABLET | ORAL | 1 refills | Status: DC

## 2019-07-16 DIAGNOSIS — G4733 Obstructive sleep apnea (adult) (pediatric): Secondary | ICD-10-CM | POA: Diagnosis not present

## 2019-08-02 ENCOUNTER — Ambulatory Visit (INDEPENDENT_AMBULATORY_CARE_PROVIDER_SITE_OTHER): Payer: BC Managed Care – PPO

## 2019-08-02 ENCOUNTER — Other Ambulatory Visit: Payer: Self-pay

## 2019-08-02 DIAGNOSIS — Z952 Presence of prosthetic heart valve: Secondary | ICD-10-CM

## 2019-08-02 DIAGNOSIS — Z5181 Encounter for therapeutic drug level monitoring: Secondary | ICD-10-CM | POA: Diagnosis not present

## 2019-08-02 LAB — POCT INR: INR: 1.6 — AB (ref 2.0–3.0)

## 2019-08-02 NOTE — Patient Instructions (Signed)
-   take 2 tablets warfarin tonight -START NEW DOSAGE of 1.5 tablets warfarin every day. - recheck INR in 3 weeks.

## 2019-08-13 DIAGNOSIS — G4733 Obstructive sleep apnea (adult) (pediatric): Secondary | ICD-10-CM | POA: Diagnosis not present

## 2019-08-16 ENCOUNTER — Other Ambulatory Visit: Payer: Self-pay | Admitting: Internal Medicine

## 2019-08-16 DIAGNOSIS — L409 Psoriasis, unspecified: Secondary | ICD-10-CM

## 2019-08-23 ENCOUNTER — Other Ambulatory Visit: Payer: Self-pay

## 2019-08-23 ENCOUNTER — Ambulatory Visit (INDEPENDENT_AMBULATORY_CARE_PROVIDER_SITE_OTHER): Payer: BC Managed Care – PPO

## 2019-08-23 DIAGNOSIS — Z5181 Encounter for therapeutic drug level monitoring: Secondary | ICD-10-CM

## 2019-08-23 DIAGNOSIS — Z952 Presence of prosthetic heart valve: Secondary | ICD-10-CM | POA: Diagnosis not present

## 2019-08-23 LAB — POCT INR: INR: 4.7 — AB (ref 2.0–3.0)

## 2019-08-23 NOTE — Patient Instructions (Signed)
-   have a serving of greens tonight - skip warfarin tonight - tomorrow, resume dosage of 1.5 tablets warfarin every day. - recheck INR in 3 weeks.  -get your greens back on schedule

## 2019-09-01 ENCOUNTER — Encounter: Payer: Self-pay | Admitting: Internal Medicine

## 2019-09-01 ENCOUNTER — Ambulatory Visit (INDEPENDENT_AMBULATORY_CARE_PROVIDER_SITE_OTHER): Payer: BC Managed Care – PPO | Admitting: Internal Medicine

## 2019-09-01 ENCOUNTER — Other Ambulatory Visit: Payer: Self-pay

## 2019-09-01 ENCOUNTER — Other Ambulatory Visit: Payer: Self-pay | Admitting: Surgery

## 2019-09-01 VITALS — BP 112/64 | HR 91 | Temp 96.9°F | Ht 67.0 in | Wt 195.0 lb

## 2019-09-01 DIAGNOSIS — Z952 Presence of prosthetic heart valve: Secondary | ICD-10-CM

## 2019-09-01 DIAGNOSIS — I712 Thoracic aortic aneurysm, without rupture, unspecified: Secondary | ICD-10-CM

## 2019-09-01 DIAGNOSIS — K760 Fatty (change of) liver, not elsewhere classified: Secondary | ICD-10-CM

## 2019-09-01 DIAGNOSIS — Z9889 Other specified postprocedural states: Secondary | ICD-10-CM | POA: Diagnosis not present

## 2019-09-01 DIAGNOSIS — Z8679 Personal history of other diseases of the circulatory system: Secondary | ICD-10-CM

## 2019-09-01 DIAGNOSIS — Z0000 Encounter for general adult medical examination without abnormal findings: Secondary | ICD-10-CM | POA: Diagnosis not present

## 2019-09-01 LAB — POCT URINALYSIS DIPSTICK
Bilirubin, UA: NEGATIVE
Blood, UA: NEGATIVE
Glucose, UA: NEGATIVE
Ketones, UA: NEGATIVE
Leukocytes, UA: NEGATIVE
Nitrite, UA: NEGATIVE
Protein, UA: POSITIVE — AB
Spec Grav, UA: 1.02 (ref 1.010–1.025)
Urobilinogen, UA: 0.2 E.U./dL
pH, UA: 5 (ref 5.0–8.0)

## 2019-09-01 NOTE — Progress Notes (Signed)
Date:  09/01/2019   Name:  Henry Duncan   DOB:  02/19/1991   MRN:  [id number]   Chief Complaint: Annual Exam (Pt did not get a flu shot this past season.) Henry Duncan is a 29 y.o. male who presents today for his Complete Annual Exam. He feels well. He reports exercising walking the dog daily. He reports he is sleeping fairly well. He is doing well since his aneurysm and aortic valve surgery one year ago.  He is on warfarin for life - levels monitored by cardiology.    Immunization History  Administered Date(s) Administered  . Hepatitis B, adult 09/02/2015, 11/06/2015, 03/03/2016  . Tdap 09/02/2011    HPI  Lab Results  Component Value Date   CREATININE 0.82 09/26/2018   BUN 10 09/26/2018   NA 133 (L) 09/26/2018   K 2.7 (LL) 09/26/2018   CL 83 (L) 09/26/2018   CO2 34 (H) 09/26/2018   No results found for: CHOL, HDL, LDLCALC, LDLDIRECT, TRIG, CHOLHDL No results found for: TSH Lab Results  Component Value Date   HGBA1C 5.2 09/16/2018   Lab Results  Component Value Date   WBC 13.9 (H) 09/26/2018   HGB 9.8 (L) 09/26/2018   HCT 29.1 (L) 09/26/2018   MCV 96.0 09/26/2018   PLT 487 (H) 09/26/2018   Lab Results  Component Value Date   ALT 69 (H) 09/16/2018   AST 51 (H) 09/16/2018   ALKPHOS 83 09/16/2018   BILITOT 0.8 09/16/2018     Review of Systems  Constitutional: Negative for appetite change, chills, diaphoresis, fatigue and unexpected weight change.  HENT: Negative for hearing loss, tinnitus, trouble swallowing and voice change.   Eyes: Negative for visual disturbance.  Respiratory: Negative for choking, shortness of breath and wheezing.   Cardiovascular: Negative for chest pain, palpitations and leg swelling.  Gastrointestinal: Negative for abdominal pain, blood in stool, constipation and diarrhea.  Genitourinary: Negative for difficulty urinating, dysuria, frequency and hematuria.  Musculoskeletal: Negative for arthralgias, back pain and  myalgias.  Skin: Negative for color change and rash.  Neurological: Negative for dizziness, syncope and headaches.  Hematological: Negative for adenopathy.  Psychiatric/Behavioral: Negative for dysphoric mood and sleep disturbance.    Patient Active Problem List   Diagnosis Date Noted  . Warfarin anticoagulation 03/15/2019  . Encounter for therapeutic drug monitoring 09/29/2018  . Aortic valve prosthesis present 09/29/2018  . S/P ascending aortic aneurysm repair 09/19/2018  . Allergic rhinitis 09/06/2018  . Hepatic steatosis 10/30/2016  . Insomnia 11/29/2015  . Migraine 11/29/2015  . GERD (gastroesophageal reflux disease) 02/11/2015    No Known Allergies  Past Surgical History:  Procedure Laterality Date  . BENTALL PROCEDURE N/A 09/19/2018   Procedure: BENTALL PROCEDURE WITH 23 [place].;  Surgeon: Gaye Pollack, MD;  Location: [facility];  Service: Open Heart Surgery;  Laterality: N/A;  . CARDIAC SURGERY    . CLEFT LIP REPAIR    . SUBXYPHOID PERICARDIAL WINDOW N/A 09/26/2018   Procedure: SUBXYPHOID PERICARDIAL WINDOW;  Surgeon: Gaye Pollack, MD;  Location: [facility];  Service: Thoracic;  Laterality: N/A;  . TEE WITHOUT CARDIOVERSION N/A 09/19/2018   Procedure: TRANSESOPHAGEAL ECHOCARDIOGRAM (TEE);  Surgeon: Gaye Pollack, MD;  Location: [place];  Service: Open Heart Surgery;  Laterality: N/A;    Social History   Tobacco Use  . Smoking status: Never Smoker  . Smokeless tobacco: Never Used  Substance Use Topics  . Alcohol use: No  Alcohol/week: 0.0 standard drinks    Comment: pt states on occasion  . Drug use: No     Medication list has been reviewed and updated.  Current Meds  Medication Sig  . acetaminophen (TYLENOL) 325 MG tablet Take 650 mg by mouth every 6 (six) hours as needed (for pain.).  Marland Kitchen metoprolol succinate (TOPROL-XL) 50 MG 24 hr tablet Take 1 1/2 tabs (75mg ) by mouth twice a day  . triamcinolone (KENALOG) 0.025 % cream APPLY  CREAM TOPICALLY  TO AFFECTED AREA (GENITALS) TWICE DAILY  . triamcinolone ointment (KENALOG) 0.1 % Apply topically 2 (two) times daily.  Marland Kitchen warfarin (COUMADIN) 5 MG tablet Take 1 tablet (5 mg total) by mouth one time only at 6 PM. As directed by the coumadin clinic    PHQ 2/9 Scores 09/01/2019 03/15/2019 02/11/2015  PHQ - 2 Score 0 0 0  PHQ- 9 Score 0 - -    BP Readings from Last 3 Encounters:  09/01/19 112/64  03/15/19 112/68  02/11/19 (!) 152/90    Physical Exam Vitals and nursing note reviewed.  Constitutional:      Appearance: Normal appearance. He is well-developed.  HENT:     Head: Normocephalic.     Right Ear: Tympanic membrane, ear canal and external ear normal.     Left Ear: Tympanic membrane, ear canal and external ear normal.     Nose: Nose normal.     Mouth/Throat:     Pharynx: Uvula midline.  Eyes:     Conjunctiva/sclera: Conjunctivae normal.     Pupils: Pupils are equal, round, and reactive to light.  Neck:     Thyroid: No thyromegaly.     Vascular: No carotid bruit.  Cardiovascular:     Rate and Rhythm: Normal rate and regular rhythm.     Heart sounds: S1 normal. No murmur.     Comments: S2 prosthetic click Pulmonary:     Effort: Pulmonary effort is normal.     Breath sounds: Normal breath sounds. No wheezing.  Chest:     Breasts:        Right: No mass.        Left: No mass.  Abdominal:     General: Bowel sounds are normal.     Palpations: Abdomen is soft.     Tenderness: There is no abdominal tenderness.  Musculoskeletal:        General: Normal range of motion.     Cervical back: Normal range of motion and neck supple.     Right lower leg: No edema.     Left lower leg: No edema.  Lymphadenopathy:     Cervical: No cervical adenopathy.  Skin:    General: Skin is warm and dry.     Capillary Refill: Capillary refill takes less than 2 seconds.  Neurological:     General: No focal deficit present.     Mental Status: He is alert and oriented to person, place, and time.      Deep Tendon Reflexes: Reflexes are normal and symmetric.  Psychiatric:        Attention and Perception: Attention normal.        Mood and Affect: Mood normal.        Speech: Speech normal.        Behavior: Behavior normal.     Wt Readings from Last 3 Encounters:  09/01/19 195 lb (88.5 kg)  03/15/19 189 lb (85.7 kg)  02/11/19 180 lb (81.6 kg)    BP 112/64  Pulse 91   Temp (!) 96.9 F (36.1 C) (Temporal)   Ht 5\' 7"  (1.702 m)   Wt 195 lb (88.5 kg)   SpO2 98%   BMI 30.54 kg/m   Assessment and Plan: 1. Annual physical exam Normal exam Continue healthy diet, exercise and work on weight loss - CBC with Differential/Platelet - Lipid panel - POCT urinalysis dipstick  2. Aortic valve prosthesis present Doing well without symptoms of decompensation INR monitored by the coumadin clinic No persistent bleeding issues  3. S/P ascending aortic aneurysm repair Being monitored yearly  4. Hepatic steatosis Discussed weight loss and will recheck labs - Comprehensive metabolic panel   Partially dictated using Dragon software. Any errors are unintentional.  Halina Maidens, MD [facility]  09/01/2019

## 2019-09-02 LAB — COMPREHENSIVE METABOLIC PANEL
ALT: 98 IU/L — ABNORMAL HIGH (ref 0–44)
AST: 87 IU/L — ABNORMAL HIGH (ref 0–40)
Albumin/Globulin Ratio: 1.7 (ref 1.2–2.2)
Albumin: 4.7 g/dL (ref 4.1–5.2)
Alkaline Phosphatase: 122 IU/L — ABNORMAL HIGH (ref 39–117)
BUN/Creatinine Ratio: 15 (ref 9–20)
BUN: 11 mg/dL (ref 6–20)
Bilirubin Total: 0.4 mg/dL (ref 0.0–1.2)
CO2: 24 mmol/L (ref 20–29)
Calcium: 10.1 mg/dL (ref 8.7–10.2)
Chloride: 97 mmol/L (ref 96–106)
Creatinine, Ser: 0.71 mg/dL — ABNORMAL LOW (ref 0.76–1.27)
GFR calc Af Amer: 148 mL/min/{1.73_m2} (ref >59)
GFR calc non Af Amer: 128 mL/min/{1.73_m2} (ref >59)
Globulin, Total: 2.8 g/dL (ref 1.5–4.5)
Glucose: 97 mg/dL (ref 65–99)
Potassium: 4.9 mmol/L (ref 3.5–5.2)
Sodium: 139 mmol/L (ref 134–144)
Total Protein: 7.5 g/dL (ref 6.0–8.5)

## 2019-09-02 LAB — CBC WITH DIFFERENTIAL/PLATELET
Basophils Absolute: 0 10*3/uL (ref 0.0–0.2)
Basos: 0 %
EOS (ABSOLUTE): 0.2 10*3/uL (ref 0.0–0.4)
Eos: 2 %
Hematocrit: 47.8 % (ref 37.5–51.0)
Hemoglobin: 16.8 g/dL (ref 13.0–17.7)
Immature Grans (Abs): 0.1 10*3/uL (ref 0.0–0.1)
Immature Granulocytes: 1 %
Lymphocytes Absolute: 1.7 10*3/uL (ref 0.7–3.1)
Lymphs: 18 %
MCH: 35.4 pg — ABNORMAL HIGH (ref 26.6–33.0)
MCHC: 35.1 g/dL (ref 31.5–35.7)
MCV: 101 fL — ABNORMAL HIGH (ref 79–97)
Monocytes Absolute: 0.9 10*3/uL (ref 0.1–0.9)
Monocytes: 9 %
Neutrophils Absolute: 6.7 10*3/uL (ref 1.4–7.0)
Neutrophils: 70 %
Platelets: 236 10*3/uL (ref 150–450)
RBC: 4.74 x10E6/uL (ref 4.14–5.80)
RDW: 12.5 % (ref 11.6–15.4)
WBC: 9.6 10*3/uL (ref 3.4–10.8)

## 2019-09-02 LAB — LIPID PANEL
Chol/HDL Ratio: 5.9 ratio — ABNORMAL HIGH (ref 0.0–5.0)
Cholesterol, Total: 316 mg/dL — ABNORMAL HIGH (ref 100–199)
HDL: 54 mg/dL (ref >39)
LDL Chol Calc (NIH): 217 mg/dL — ABNORMAL HIGH (ref 0–99)
Triglycerides: 230 mg/dL — ABNORMAL HIGH (ref 0–149)
VLDL Cholesterol Cal: 45 mg/dL — ABNORMAL HIGH (ref 5–40)

## 2019-09-04 ENCOUNTER — Encounter: Payer: Self-pay | Admitting: Internal Medicine

## 2019-09-13 DIAGNOSIS — G4733 Obstructive sleep apnea (adult) (pediatric): Secondary | ICD-10-CM | POA: Diagnosis not present

## 2019-09-25 ENCOUNTER — Other Ambulatory Visit: Payer: Self-pay

## 2019-09-25 ENCOUNTER — Ambulatory Visit (INDEPENDENT_AMBULATORY_CARE_PROVIDER_SITE_OTHER): Payer: BC Managed Care – PPO

## 2019-09-25 DIAGNOSIS — Z952 Presence of prosthetic heart valve: Secondary | ICD-10-CM | POA: Diagnosis not present

## 2019-09-25 DIAGNOSIS — Z5181 Encounter for therapeutic drug level monitoring: Secondary | ICD-10-CM | POA: Diagnosis not present

## 2019-09-25 LAB — POCT INR: INR: 3.2 — AB (ref 2.0–3.0)

## 2019-09-25 NOTE — Patient Instructions (Signed)
-   have a serving of greens tonight - continue dosage of 1.5 tablets warfarin every day. - recheck INR in 3 weeks.

## 2019-10-11 ENCOUNTER — Encounter: Payer: Self-pay | Admitting: Surgery

## 2019-10-11 ENCOUNTER — Ambulatory Visit
Admission: RE | Admit: 2019-10-11 | Discharge: 2019-10-11 | Disposition: A | Discharge status: Home / Self Care | Payer: BC Managed Care – PPO | Source: Ambulatory Visit | Attending: Surgery | Admitting: Surgery

## 2019-10-11 ENCOUNTER — Other Ambulatory Visit: Payer: Self-pay

## 2019-10-11 ENCOUNTER — Ambulatory Visit (INDEPENDENT_AMBULATORY_CARE_PROVIDER_SITE_OTHER): Payer: BC Managed Care – PPO | Admitting: Surgery

## 2019-10-11 VITALS — BP 144/95 | HR 94 | Temp 97.4°F | Resp 20 | Ht 67.0 in | Wt 190.0 lb

## 2019-10-11 DIAGNOSIS — I712 Thoracic aortic aneurysm, without rupture, unspecified: Secondary | ICD-10-CM

## 2019-10-11 MED ORDER — IOPAMIDOL (ISOVUE-370) INJECTION 76%
75.0000 mL | Freq: Once | INTRAVENOUS | Status: AC | PRN
  Administered 2019-10-11: 75 mL via INTRAVENOUS

## 2019-10-11 NOTE — Progress Notes (Signed)
HPI:  The patient returns to see me for 1 year follow-up status post right axillary artery cannulation for extracorporeal circulation with replacement of the ascending aortic aneurysm under deep hypothermic circulatory arrest with antegrade cerebral perfusion and Bentall procedure using a 23 mm [employer] Jude mechanical valved conduit on 09/19/2018.  He has been feeling well without chest pain or shortness of breath.  His INR has been fluctuating and is being managed by the Coumadin clinic.  His last INR on 09/25/2019 was 3.2.  Current Outpatient Medications  Medication Sig Dispense Refill  . metoprolol succinate (TOPROL-XL) 50 MG 24 hr tablet Take 1 1/2 tabs (75mg ) by mouth twice a day 270 tablet 1  . triamcinolone (KENALOG) 0.025 % cream APPLY  CREAM TOPICALLY TO AFFECTED AREA (GENITALS) TWICE DAILY 30 g 0  . triamcinolone ointment (KENALOG) 0.1 % Apply topically 2 (two) times daily.    Marland Kitchen warfarin (COUMADIN) 5 MG tablet Take 1 tablet (5 mg total) by mouth one time only at 6 PM. As directed by the coumadin clinic 100 tablet 3  . acetaminophen (TYLENOL) 325 MG tablet Take 650 mg by mouth every 6 (six) hours as needed (for pain.).     No current facility-administered medications for this visit.     Physical Exam: BP (!) 144/95 (BP Location: Left Arm)   Pulse 94   Temp (!) 97.4 F (36.3 C) (Oral)   Resp 20   Ht 5\' 7"  (1.702 m)   Wt 190 lb (86.2 kg)   SpO2 98% Comment: RA  BMI 29.76 kg/m  He looks well. Cardiac exam shows a regular rate and rhythm with a crisp mechanical valve click.  There is no murmur. Lungs are clear. The chest incision is well-healed and the sternum is stable. There is no peripheral edema.  Diagnostic Tests:  CLINICAL DATA:  Thoracic aortic aneurysm post Bentall procedure  EXAM: CT ANGIOGRAPHY CHEST WITH CONTRAST  TECHNIQUE: Multidetector CT imaging of the chest was performed using the standard protocol during bolus administration of  intravenous contrast. Multiplanar CT image reconstructions and MIPs were obtained to evaluate the vascular anatomy.  CONTRAST:  79mL ISOVUE-370 IOPAMIDOL (ISOVUE-370) INJECTION 76%  COMPARISON:  09/13/2018  FINDINGS: Cardiovascular: Postoperative changes of aortic root replacement with aortic valve prosthesis. There is no evidence of complication. Normal caliber aorta. Normal heart size. Coronary artery calcification. No pericardial effusion.  Mediastinum/Nodes: No enlarged lymph nodes identified. Thyroid is unremarkable. Esophagus is unremarkable.  Lungs/Pleura: No consolidation or mass. No pleural effusion or pneumothorax.  Upper Abdomen: No acute abnormality.  Musculoskeletal: No acute abnormality.  Review of the MIP images confirms the above findings.  IMPRESSION: Postoperative changes of thoracic aorta aneurysm repair. Normal caliber aorta.   Electronically Signed   By: Macy Mis M.D.   On: 10/11/2019 13:36  Impression:  He continues to do well 1 year following his surgery.  CTA of the chest at this time shows that the remainder of his aortic arch and descending thoracic aorta are normal size.  The ascending aortic graft is in normal position.  There is no evidence of pseudoaneurysm formation of any of the anastomoses.  The right and left coronary artery are widely patent.  I reviewed the CTA images with him and answered his questions.  I do not think he requires any further follow-up CT scans.  Plan:  He will continue to follow-up with cardiology and his PCP and I will be happy to see him back if the need  arises.  I spent 10 minutes performing this established patient evaluation and > 50% of this time was spent face to face counseling and coordinating the care of this patient's aortic aneurysm repair.    Gaye Pollack, MD [facility] [phone]

## 2019-10-16 ENCOUNTER — Other Ambulatory Visit: Payer: Self-pay

## 2019-10-16 ENCOUNTER — Ambulatory Visit (INDEPENDENT_AMBULATORY_CARE_PROVIDER_SITE_OTHER): Payer: BC Managed Care – PPO

## 2019-10-16 DIAGNOSIS — Z952 Presence of prosthetic heart valve: Secondary | ICD-10-CM

## 2019-10-16 DIAGNOSIS — Z5181 Encounter for therapeutic drug level monitoring: Secondary | ICD-10-CM

## 2019-10-16 LAB — POCT INR: INR: 4.3 — AB (ref 2.0–3.0)

## 2019-10-16 NOTE — Patient Instructions (Signed)
-   have a serving of greens tonight - skip warfarin tonight - take 1/2 tablet warfarin tomorrow - continue dosage of 1.5 tablets warfarin every day.  - recheck INR in 3 weeks.  -get your greens back on schedule

## 2019-11-06 ENCOUNTER — Ambulatory Visit (INDEPENDENT_AMBULATORY_CARE_PROVIDER_SITE_OTHER): Payer: BC Managed Care – PPO

## 2019-11-06 ENCOUNTER — Other Ambulatory Visit: Payer: Self-pay

## 2019-11-06 DIAGNOSIS — Z5181 Encounter for therapeutic drug level monitoring: Secondary | ICD-10-CM

## 2019-11-06 DIAGNOSIS — Z952 Presence of prosthetic heart valve: Secondary | ICD-10-CM | POA: Diagnosis not present

## 2019-11-06 LAB — POCT INR: INR: 4.6 — AB (ref 2.0–3.0)

## 2019-11-06 NOTE — Patient Instructions (Signed)
-   skip warfarin tonight & tomorrow -on Wednesday, START NEW DOSAGE of 1.5 tablets warfarin every day EXCEPT 1 tablet on MONDAYS, WEDNESDAYS & FRIDAYS - recheck INR in 4 weeks.  -get your greens back on schedule

## 2019-12-04 ENCOUNTER — Other Ambulatory Visit: Payer: Self-pay

## 2019-12-04 ENCOUNTER — Ambulatory Visit (INDEPENDENT_AMBULATORY_CARE_PROVIDER_SITE_OTHER): Payer: BC Managed Care – PPO

## 2019-12-04 DIAGNOSIS — Z5181 Encounter for therapeutic drug level monitoring: Secondary | ICD-10-CM

## 2019-12-04 DIAGNOSIS — Z952 Presence of prosthetic heart valve: Secondary | ICD-10-CM

## 2019-12-04 LAB — POCT INR: INR: 1.9 — AB (ref 2.0–3.0)

## 2019-12-04 NOTE — Patient Instructions (Signed)
-   take 2 tablets tonight -then resume dosage of 1.5 tablets warfarin every day EXCEPT 1 tablet on MONDAYS, WEDNESDAYS & FRIDAYS - recheck INR in 4 weeks.  -get your greens back on schedule

## 2019-12-13 ENCOUNTER — Other Ambulatory Visit: Payer: Self-pay | Admitting: Internal Medicine

## 2019-12-13 ENCOUNTER — Encounter: Payer: Self-pay | Admitting: Internal Medicine

## 2019-12-18 ENCOUNTER — Other Ambulatory Visit: Payer: Self-pay

## 2019-12-18 MED ORDER — WARFARIN SODIUM 5 MG PO TABS: 5.0000 mg | ORAL_TABLET | Freq: Once | ORAL | 0 refills | Status: DC

## 2019-12-25 ENCOUNTER — Encounter: Payer: Self-pay | Admitting: Surgery

## 2019-12-26 ENCOUNTER — Other Ambulatory Visit: Payer: Self-pay

## 2019-12-26 MED ORDER — METOPROLOL SUCCINATE ER 50 MG PO TB24: ORAL_TABLET | ORAL | 1 refills | Status: DC

## 2019-12-26 NOTE — Telephone Encounter (Signed)
Refilled toprol xl

## 2020-01-01 ENCOUNTER — Other Ambulatory Visit: Payer: Self-pay

## 2020-01-01 ENCOUNTER — Ambulatory Visit: Payer: BC Managed Care – PPO

## 2020-01-01 DIAGNOSIS — Z5181 Encounter for therapeutic drug level monitoring: Secondary | ICD-10-CM

## 2020-01-01 DIAGNOSIS — Z952 Presence of prosthetic heart valve: Secondary | ICD-10-CM | POA: Diagnosis not present

## 2020-01-01 LAB — POCT INR: INR: 2.3 (ref 2.0–3.0)

## 2020-01-01 NOTE — Patient Instructions (Addendum)
-   START NEW DOSAGE of warfarin of 1.5 tablets warfarin every day EXCEPT 1 tablet on MONDAYS, WEDNESDAYS & FRIDAYS - recheck INR in 5 weeks.  -get your greens back on schedule

## 2020-02-12 ENCOUNTER — Ambulatory Visit: Payer: BC Managed Care – PPO | Admitting: Cardiology

## 2020-02-19 ENCOUNTER — Ambulatory Visit (INDEPENDENT_AMBULATORY_CARE_PROVIDER_SITE_OTHER): Payer: BC Managed Care – PPO

## 2020-02-19 ENCOUNTER — Other Ambulatory Visit: Payer: Self-pay

## 2020-02-19 DIAGNOSIS — Z5181 Encounter for therapeutic drug level monitoring: Secondary | ICD-10-CM | POA: Diagnosis not present

## 2020-02-19 DIAGNOSIS — Z952 Presence of prosthetic heart valve: Secondary | ICD-10-CM

## 2020-02-19 LAB — POCT INR: INR: 2.7 (ref 2.0–3.0)

## 2020-02-19 NOTE — Patient Instructions (Signed)
-   continue of warfarin of 1.5 tablets warfarin every day EXCEPT 1 tablet on MONDAYS & FRIDAYS - recheck INR in 6 weeks.  -get your greens back on schedule

## 2020-02-28 IMAGING — DX PORTABLE CHEST - 1 VIEW
1 series · 1 of 1 positions shown · non-contrast
Comparison: September 13, 2018

CLINICAL DATA: Preoperative study prior to cardiovascular exam.

EXAM:
PORTABLE CHEST 1 VIEW

[chest ap]
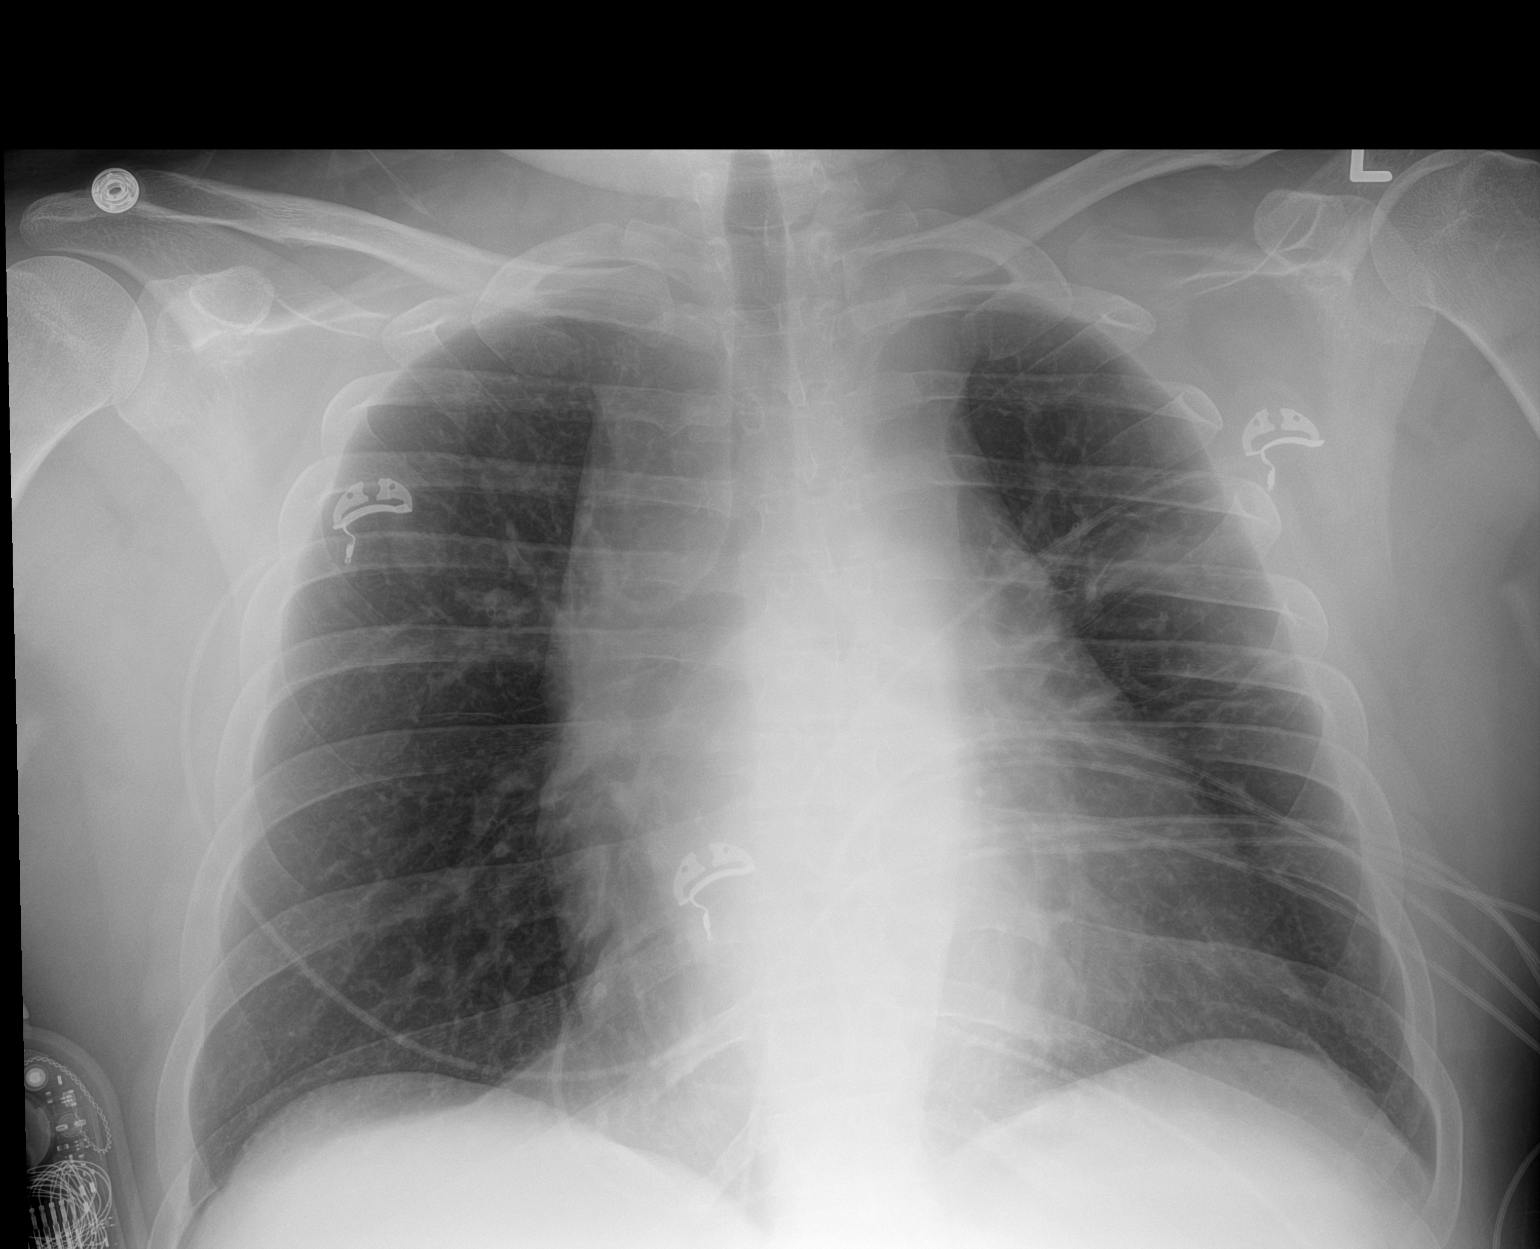

[1 of 1 positions shown; findings below may reference images not displayed]

FINDINGS: Mild new focal opacity in the left upper lung. The cardiomediastinal
silhouette is stable. No pneumothorax. No other abnormalities.
IMPRESSION: Mild focal opacity in the left upper lung could represent
atelectasis or developing infiltrate. No other interval changes.

## 2020-03-02 IMAGING — DX PORTABLE CHEST - 1 VIEW
1 series · 1 of 1 positions shown · non-contrast
Comparison: Radiograph September 19, 2018.

CLINICAL DATA: Status post ascending aortic aneurysm repair.

EXAM:
PORTABLE CHEST 1 VIEW

[chest]
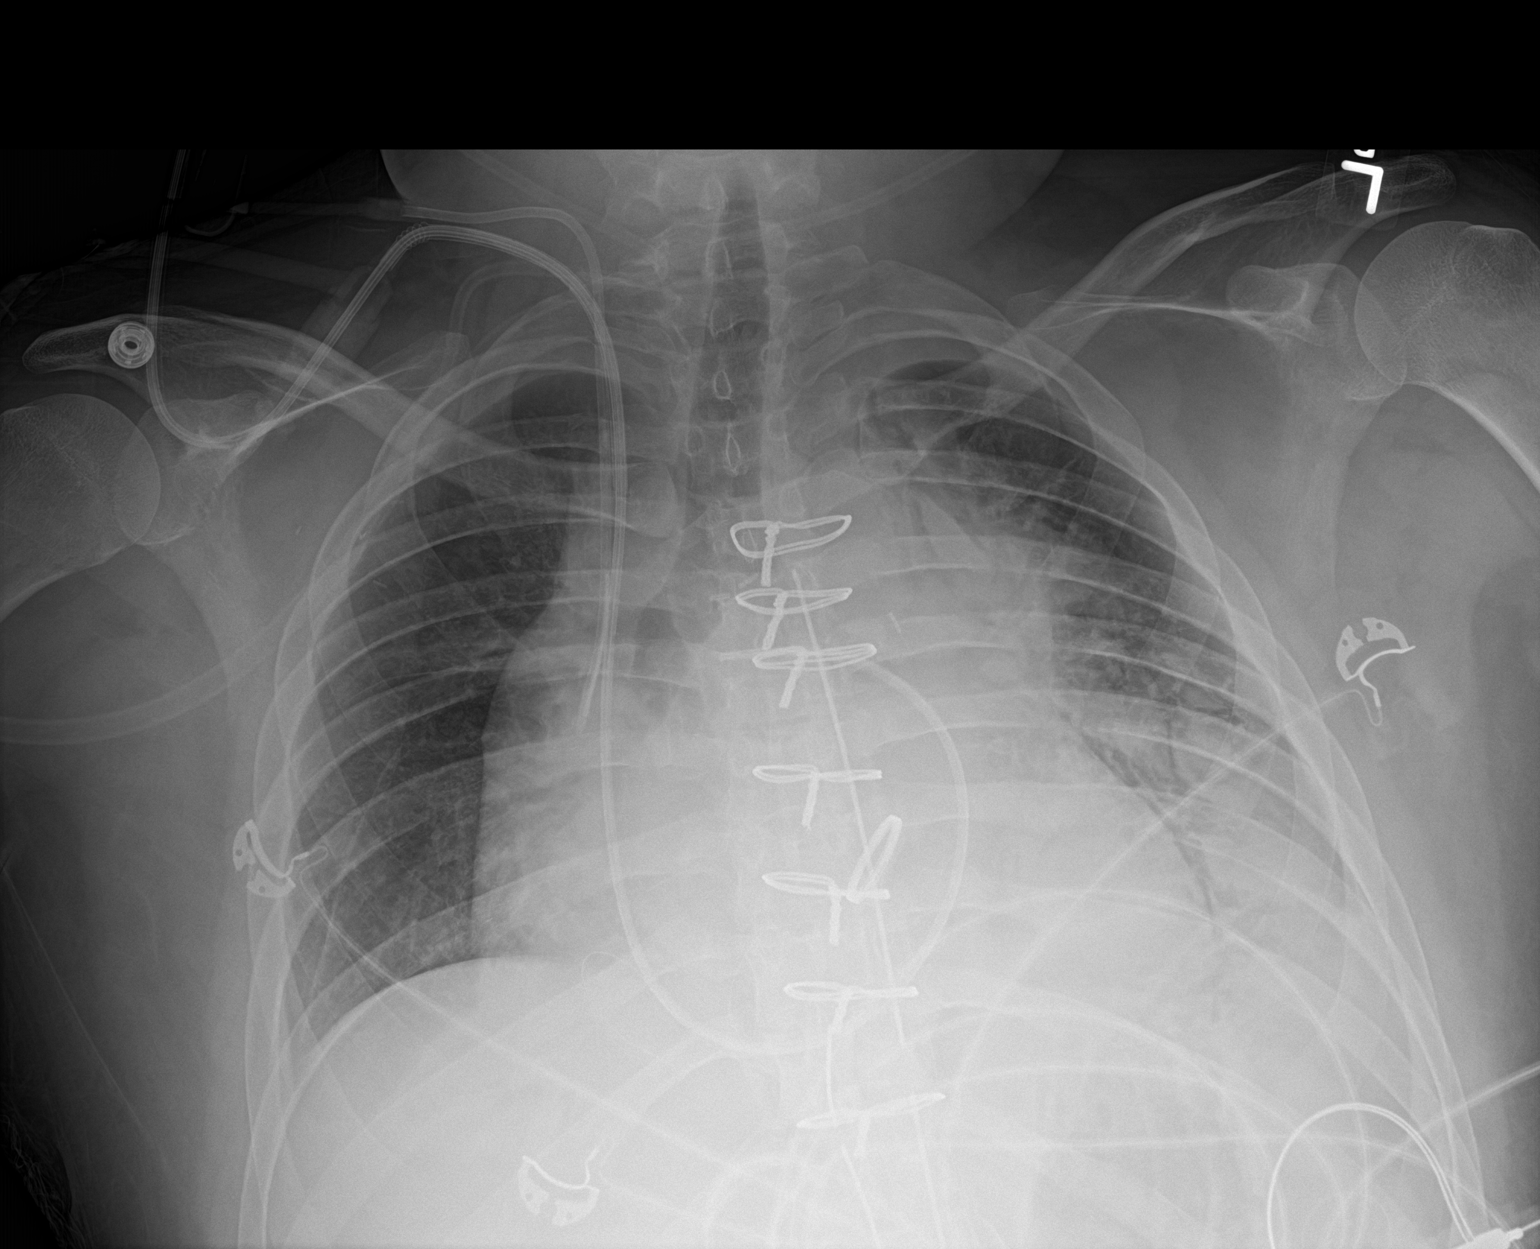

[1 of 1 positions shown; findings below may reference images not displayed]

FINDINGS: Stable cardiomediastinal silhouette. Endotracheal and nasogastric
tubes have been removed. Right internal jugular Swan-Ganz catheter
is noted with tip directed into right main pulmonary artery. Right
internal jugular catheter is noted with distal tip in expected
position cavoatrial junction. No pneumothorax is noted. Right lung
is clear. Left basilar atelectasis or infiltrate is noted with
associated pleural effusion. Bony thorax is unremarkable.
IMPRESSION: Endotracheal and nasogastric tubes have been removed. Stable left
basilar opacity concerning for atelectasis or infiltrate with
associated pleural effusion.

## 2020-03-03 IMAGING — DX PORTABLE CHEST - 1 VIEW
1 series · 1 of 1 positions shown · non-contrast
Comparison: One-view chest x-ray 09/20/2018

CLINICAL DATA: Chest tube present. Status post ascending aortic
replacement.

EXAM:
PORTABLE CHEST 1 VIEW

[chest ap]
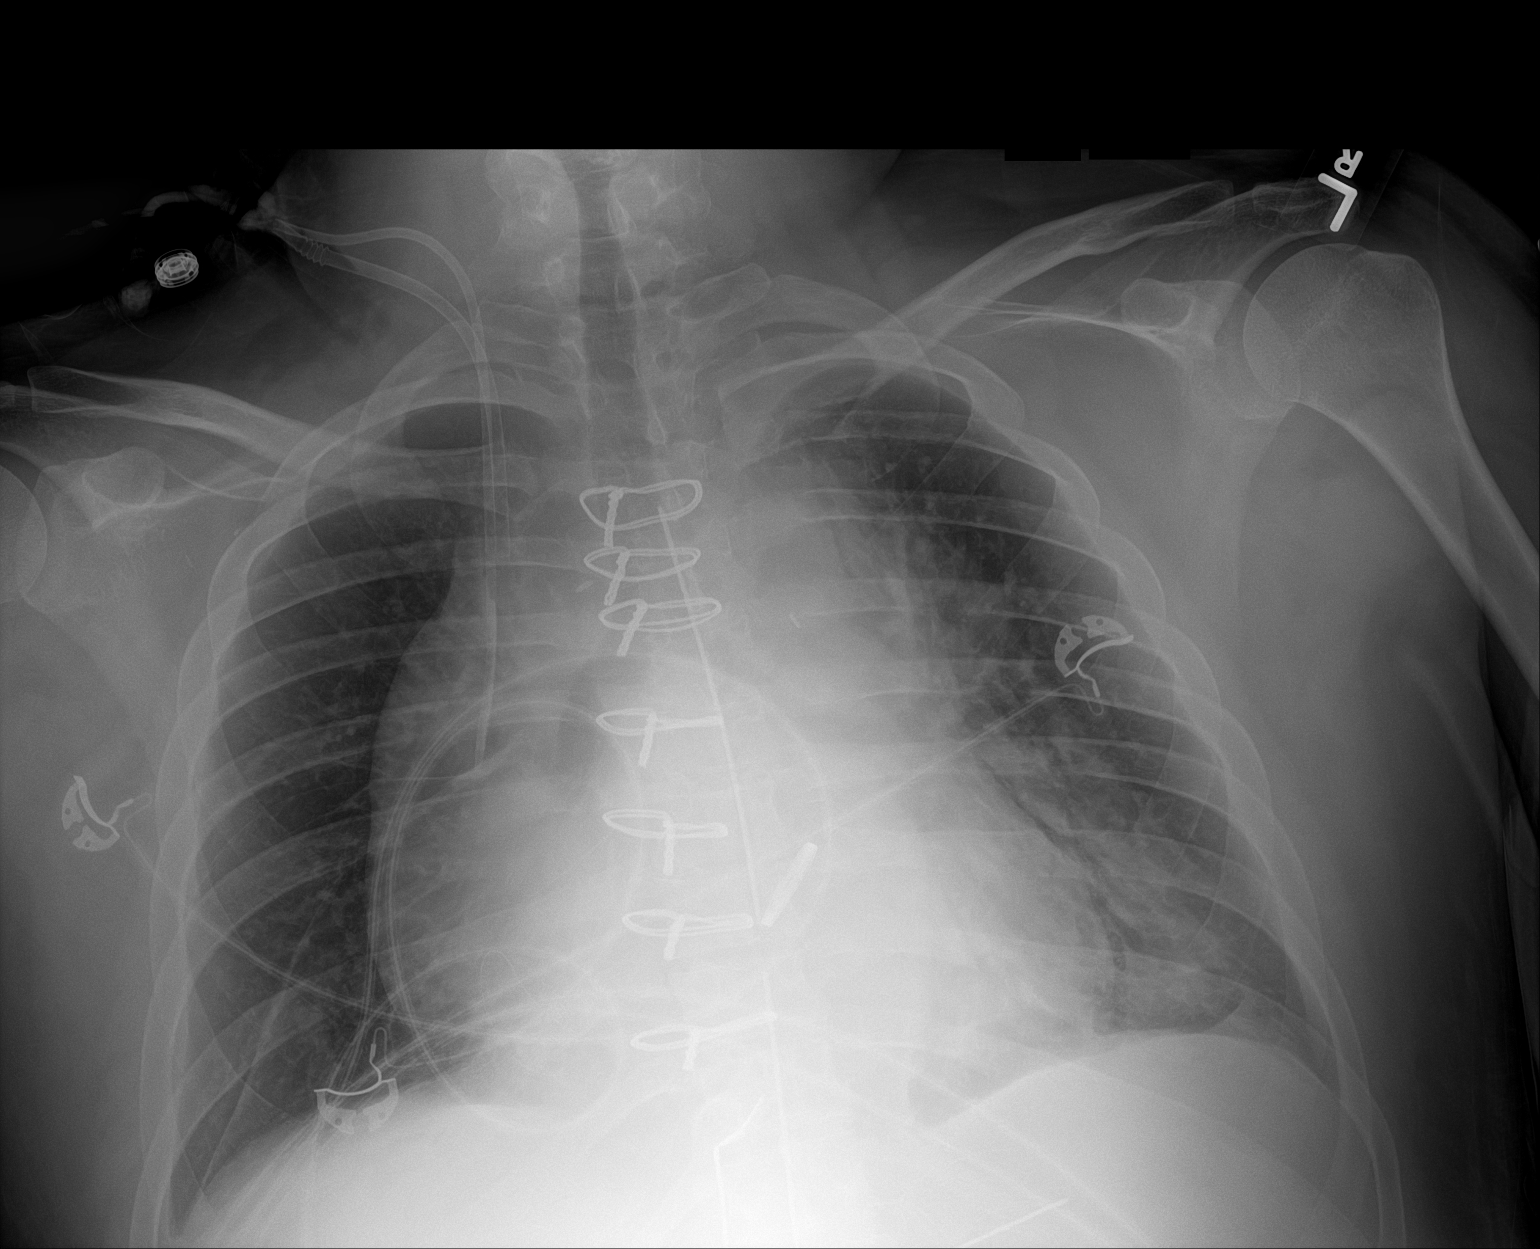

[1 of 1 positions shown; findings below may reference images not displayed]

FINDINGS: The heart is enlarged. Patient is status post median sternotomy.
Aortic valve replacement is noted. Right IJ line is stable.
Swan-Ganz catheter was removed. Sheath is in place. Mediastinal
drain remains. Left-sided airspace disease is improving.
IMPRESSION: 1. Interval removal of Swan-Ganz catheter.
2. Improving left-sided airspace disease.

## 2020-03-08 ENCOUNTER — Ambulatory Visit (INDEPENDENT_AMBULATORY_CARE_PROVIDER_SITE_OTHER): Payer: BC Managed Care – PPO | Admitting: Cardiology

## 2020-03-08 ENCOUNTER — Encounter: Payer: Self-pay | Admitting: Cardiology

## 2020-03-08 ENCOUNTER — Other Ambulatory Visit: Payer: Self-pay

## 2020-03-08 VITALS — BP 112/80 | HR 92 | Ht 69.0 in | Wt 191.0 lb

## 2020-03-08 DIAGNOSIS — Z8679 Personal history of other diseases of the circulatory system: Secondary | ICD-10-CM

## 2020-03-08 DIAGNOSIS — E7800 Pure hypercholesterolemia, unspecified: Secondary | ICD-10-CM

## 2020-03-08 DIAGNOSIS — Z952 Presence of prosthetic heart valve: Secondary | ICD-10-CM | POA: Diagnosis not present

## 2020-03-08 DIAGNOSIS — Z9889 Other specified postprocedural states: Secondary | ICD-10-CM

## 2020-03-08 MED ORDER — ATORVASTATIN CALCIUM 40 MG PO TABS
40.0000 mg | ORAL_TABLET | Freq: Every day | ORAL | 6 refills | Status: DC
Start: 2020-03-08 — End: 2020-08-23

## 2020-03-08 NOTE — Progress Notes (Signed)
Cardiology Office Note:    Date:  03/08/2020   ID:  Henry Duncan, DOB [birth year], MRN [id number]  PCP:  Glean Hess, MD  [facility] Cardiologist:  Kate Sable, MD  [place] Electrophysiologist:  None   Referring MD: Glean Hess, MD   Chief Complaint  Patient presents with  . Other    12 month follow up. Meds reviewed verbally with paitent.     History of Present Illness:    Henry Duncan is a 29 y.o. male with a hx of hypertension, aortic aneurysm, bicuspid aortic valve, ascending thoracic aorta dissection s/p Bentall procedure (with 23 mm [employer]. Jude's mechanical valve, developed pericardial effusion postoperatively and underwent pericardial window 08/2018) who presents for follow-up.  Patient presented to the hospital on 09/13/2018 due to chest pain.  CT did reveal ascending thoracic aorta aneurysm measuring 5.7 to 5.8 cm, localized partial dissection of the anterior middle ascending thoracic aorta.  He was subsequently taken for surgery and underwent Bentall procedure.  Patient previously seen by Dr. Bronson Ing.  He follows up yearly for aortic root monitoring.  Takes medications including metoprolol 75 mg twice daily and warfarin as prescribed.  Patient works at [employer] and his INR is monitored in the office due to closeness to work.  Last transthoracic echocardiogram 09/16/2018 showed normal systolic and diastolic function, EF 60 to 65%.  Intraoperative TEE on 09/19/2018 showed normal bioprosthetic valve with normal washing jets, transaortic gradient 7 mmHg.  CT angio of the chest/aorta on 10/11/2019 showed normal postop changes of aortic root, with no evidence of surgical complication. Patient denies any symptoms of chest pain, shortness of breath. Feels well, keeps his appointment for INR checks.  Past Medical History:  Diagnosis Date  . Anxiety   . Aortic aneurysm ([place]) 09/16/2018  . Hypertension     Past Surgical History:   Procedure Laterality Date  . BENTALL PROCEDURE N/A 09/19/2018   Procedure: BENTALL PROCEDURE WITH 23 [place].;  Surgeon: Gaye Pollack, MD;  Location: [facility] OR;  Service: Open Heart Surgery;  Laterality: N/A;  . CARDIAC SURGERY    . CLEFT LIP REPAIR    . SUBXYPHOID PERICARDIAL WINDOW N/A 09/26/2018   Procedure: SUBXYPHOID PERICARDIAL WINDOW;  Surgeon: Gaye Pollack, MD;  Location: [facility] OR;  Service: Thoracic;  Laterality: N/A;  . TEE WITHOUT CARDIOVERSION N/A 09/19/2018   Procedure: TRANSESOPHAGEAL ECHOCARDIOGRAM (TEE);  Surgeon: Gaye Pollack, MD;  Location: [facility];  Service: Open Heart Surgery;  Laterality: N/A;    Current Medications: Current Meds  Medication Sig  . metoprolol succinate (TOPROL-XL) 50 MG 24 hr tablet Take 1 1/2 tabs (75mg ) by mouth twice a day  . triamcinolone (KENALOG) 0.025 % cream APPLY  CREAM TOPICALLY TO AFFECTED AREA (GENITALS) TWICE DAILY  . triamcinolone ointment (KENALOG) 0.1 % Apply topically 2 (two) times daily.  Marland Kitchen warfarin (COUMADIN) 5 MG tablet Take 1 tablet (5 mg total) by mouth one time only at 6 PM. As directed by the coumadin clinic     Allergies:   Patient has no known allergies.   Social History   Socioeconomic History  . Marital status: Single    Spouse name: Not on file  . Number of children: 0  . Years of education: Not on file  . Highest education level: Not on file  Occupational History  . Not on file  Tobacco Use  . Smoking status: Never Smoker  . Smokeless tobacco: Never Used  Vaping Use  .  Vaping Use: Never used  Substance and Sexual Activity  . Alcohol use: No    Alcohol/week: 0.0 standard drinks    Comment: pt states on occasion  . Drug use: No  . Sexual activity: Yes    Birth control/protection: Condom  Other Topics Concern  . Not on file  Social History Narrative  . Not on file   Social Determinants of Health   Financial Resource Strain:   . Difficulty of Paying Living Expenses: Not on file  Food  Insecurity:   . Worried About Charity fundraiser in the Last Year: Not on file  . Ran Out of Food in the Last Year: Not on file  Transportation Needs:   . Lack of Transportation (Medical): Not on file  . Lack of Transportation (Non-Medical): Not on file  Physical Activity:   . Days of Exercise per Week: Not on file  . Minutes of Exercise per Session: Not on file  Stress:   . Feeling of Stress : Not on file  Social Connections:   . Frequency of Communication with Friends and Family: Not on file  . Frequency of Social Gatherings with Friends and Family: Not on file  . Attends Religious Services: Not on file  . Active Member of Clubs or Organizations: Not on file  . Attends Archivist Meetings: Not on file  . Marital Status: Not on file     Family History: The patient's family history includes Anxiety disorder in his brother and father; Depression in his brother; Diabetes in his father; Heart attack in his mother.  ROS:   Please see the history of present illness.     All other systems reviewed and are negative.  EKGs/Labs/Other Studies Reviewed:    The following studies were reviewed today:   EKG:  EKG is  ordered today.  The ekg ordered today demonstrates normal sinus rhythm.  Recent Labs: 09/01/2019: ALT 98; BUN 11; Creatinine, Ser 0.71; Hemoglobin 16.8; Platelets 236; Potassium 4.9; Sodium 139  Recent Lipid Panel    Component Value Date/Time   CHOL 316 (H) 09/01/2019 0918   TRIG 230 (H) 09/01/2019 0918   HDL 54 09/01/2019 0918   CHOLHDL 5.9 (H) 09/01/2019 0918   LDLCALC 217 (H) 09/01/2019 0918     Risk Assessment/Calculations:      Physical Exam:    VS:  BP 112/80 (BP Location: Left Arm, Patient Position: Sitting, Cuff Size: Normal)   Pulse 92   Ht 5\' 9"  (1.753 m)   Wt 191 lb (86.6 kg)   SpO2 97%   BMI 28.21 kg/m     Wt Readings from Last 3 Encounters:  03/08/20 191 lb (86.6 kg)  10/11/19 190 lb (86.2 kg)  09/01/19 195 lb (88.5 kg)     GEN:   Well nourished, well developed in no acute distress HEENT: Normal NECK: No JVD; No carotid bruits LYMPHATICS: No lymphadenopathy CARDIAC: RRR, 2/6 systolic murmur, mechanical click consistent with mechanical valve noted. RESPIRATORY:  Clear to auscultation without rales, wheezing or rhonchi  ABDOMEN: Soft, non-tender, non-distended MUSCULOSKELETAL:  No edema; No deformity  SKIN: Warm and dry NEUROLOGIC:  Alert and oriented x 3 PSYCHIATRIC:  Normal affect   ASSESSMENT:    1. S/P ascending aortic aneurysm repair   2. Hx of aortic valve replacement, mechanical   3. Pure hypercholesterolemia    PLAN:    In order of problems listed above:  1. Patient with history of aortic aneurysm, dissection, status post Bentall procedure  with aortic root replacement and mechanical aortic valve.  Last CT angio aorta obtained 09/2019 showed normal aortic graft with no postop complications.  Continue monitoring with yearly CT scan for aortic root size and or any postop complications. Continue Lopressor 75 mg twice daily. 2. History of mechanical aortic valve replacement, keep appointments with Coumadin clinic for frequent INR checks.  Previous gradient was 7.5 mmHg a year ago.  Get echocardiogram to evaluate valvular gradients and function. 3. History of hyperlipidemia, last cholesterol check with severely elevated total and LDL cholesterol.  Start Lipitor 40 mg daily.  Follow-up after echocardiogram.  Total encounter time 40 minutes  Greater than 50% was spent in counseling and coordination of care with the patient   Medication Adjustments/Labs and Tests Ordered: Current medicines are reviewed at length with the patient today.  Concerns regarding medicines are outlined above.  Orders Placed This Encounter  Procedures  . EKG 12-Lead  . ECHOCARDIOGRAM COMPLETE   Meds ordered this encounter  Medications  . atorvastatin (LIPITOR) 40 MG tablet    Sig: Take 1 tablet (40 mg total) by mouth daily.     Dispense:  30 tablet    Refill:  6    Patient Instructions  Medication Instructions:  - Your physician has recommended you make the following change in your medication:   1) START lipitor (atorvastatin) 40 mg- take 1 tablet by mouth once daily   *If you need a refill on your cardiac medications before your next appointment, please call your pharmacy*   Lab Work: - none ordered  If you have labs (blood work) drawn today and your tests are completely normal, you will receive your results only by: Marland Kitchen MyChart Message (if you have MyChart) OR . A paper copy in the mail If you have any lab test that is abnormal or we need to change your treatment, we will call you to review the results.   Testing/Procedures: - Your physician has requested that you have an echocardiogram. Echocardiography is a painless test that uses sound waves to create images of your heart. It provides your doctor with information about the size and shape of your heart and how well your heart's chambers and valves are working. This procedure takes approximately one hour. There are no restrictions for this procedure. There is a possibility that an IV may need to be started during your test to inject an image enhancing agent. This is done to obtain more optimal pictures of your heart. Therefore we ask that you do at least drink some water prior to coming in to hydrate your veins.     Follow-Up: At [facility], you and your health needs are our priority.  As part of our continuing mission to provide you with exceptional heart care, we have created designated Provider Care Teams.  These Care Teams include your primary Cardiologist (physician) and Advanced Practice Providers (APPs -  Physician Assistants and Nurse Practitioners) who all work together to provide you with the care you need, when you need it.  We recommend signing up for the patient portal called "MyChart".  Sign up information is provided on this After Visit  Summary.  MyChart is used to connect with patients for Virtual Visits (Telemedicine).  Patients are able to view lab/test results, encounter notes, upcoming appointments, etc.  Non-urgent messages can be sent to your provider as well.   To learn more about what you can do with MyChart, go to [email or web address].    Your next appointment:  After echocardiogram is completed   The format for your next appointment:   In Person  Provider:   Kate Sable, MD   Other Instructions   Echocardiogram An echocardiogram is a procedure that uses painless sound waves (ultrasound) to produce an image of the heart. Images from an echocardiogram can provide important information about:  Signs of coronary artery disease (CAD).  Aneurysm detection. An aneurysm is a weak or damaged part of an artery wall that bulges out from the normal force of blood pumping through the body.  Heart size and shape. Changes in the size or shape of the heart can be associated with certain conditions, including heart failure, aneurysm, and CAD.  Heart muscle function.  Heart valve function.  Signs of a past heart attack.  Fluid buildup around the heart.  Thickening of the heart muscle.  A tumor or infectious growth around the heart valves. Tell a health care provider about:  Any allergies you have.  All medicines you are taking, including vitamins, herbs, eye drops, creams, and over-the-counter medicines.  Any blood disorders you have.  Any surgeries you have had.  Any medical conditions you have.  Whether you are pregnant or may be pregnant. What are the risks? Generally, this is a safe procedure. However, problems may occur, including:  Allergic reaction to dye (contrast) that may be used during the procedure. What happens before the procedure? No specific preparation is needed. You may eat and drink normally. What happens during the procedure?   An IV tube may be inserted into one of  your veins.  You may receive contrast through this tube. A contrast is an injection that improves the quality of the pictures from your heart.  A gel will be applied to your chest.  A wand-like tool (transducer) will be moved over your chest. The gel will help to transmit the sound waves from the transducer.  The sound waves will harmlessly bounce off of your heart to allow the heart images to be captured in real-time motion. The images will be recorded on a computer. The procedure may vary among health care providers and hospitals. What happens after the procedure?  You may return to your normal, everyday life, including diet, activities, and medicines, unless your health care provider tells you not to do that. Summary  An echocardiogram is a procedure that uses painless sound waves (ultrasound) to produce an image of the heart.  Images from an echocardiogram can provide important information about the size and shape of your heart, heart muscle function, heart valve function, and fluid buildup around your heart.  You do not need to do anything to prepare before this procedure. You may eat and drink normally.  After the echocardiogram is completed, you may return to your normal, everyday life, unless your health care provider tells you not to do that. This information is not intended to replace advice given to you by your health care provider. Make sure you discuss any questions you have with your health care provider. Document Revised: 09/01/2018 Document Reviewed: 06/13/2016 Elsevier Patient Education  2020 [place], Kate Sable, MD  03/08/2020 4:53 PM    [place]

## 2020-03-08 NOTE — Patient Instructions (Addendum)
Medication Instructions:  - Your physician has recommended you make the following change in your medication:   1) START lipitor (atorvastatin) 40 mg- take 1 tablet by mouth once daily   *If you need a refill on your cardiac medications before your next appointment, please call your pharmacy*   Lab Work: - none ordered  If you have labs (blood work) drawn today and your tests are completely normal, you will receive your results only by: Marland Kitchen MyChart Message (if you have MyChart) OR . A paper copy in the mail If you have any lab test that is abnormal or we need to change your treatment, we will call you to review the results.   Testing/Procedures: - Your physician has requested that you have an echocardiogram. Echocardiography is a painless test that uses sound waves to create images of your heart. It provides your doctor with information about the size and shape of your heart and how well your heart's chambers and valves are working. This procedure takes approximately one hour. There are no restrictions for this procedure. There is a possibility that an IV may need to be started during your test to inject an image enhancing agent. This is done to obtain more optimal pictures of your heart. Therefore we ask that you do at least drink some water prior to coming in to hydrate your veins.     Follow-Up: At [facility], you and your health needs are our priority.  As part of our continuing mission to provide you with exceptional heart care, we have created designated Provider Care Teams.  These Care Teams include your primary Cardiologist (physician) and Advanced Practice Providers (APPs -  Physician Assistants and Nurse Practitioners) who all work together to provide you with the care you need, when you need it.  We recommend signing up for the patient portal called "MyChart".  Sign up information is provided on this After Visit Summary.  MyChart is used to connect with patients for Virtual  Visits (Telemedicine).  Patients are able to view lab/test results, encounter notes, upcoming appointments, etc.  Non-urgent messages can be sent to your provider as well.   To learn more about what you can do with MyChart, go to [email or web address].    Your next appointment:   After echocardiogram is completed   The format for your next appointment:   In Person  Provider:   Kate Sable, MD   Other Instructions   Echocardiogram An echocardiogram is a procedure that uses painless sound waves (ultrasound) to produce an image of the heart. Images from an echocardiogram can provide important information about:  Signs of coronary artery disease (CAD).  Aneurysm detection. An aneurysm is a weak or damaged part of an artery wall that bulges out from the normal force of blood pumping through the body.  Heart size and shape. Changes in the size or shape of the heart can be associated with certain conditions, including heart failure, aneurysm, and CAD.  Heart muscle function.  Heart valve function.  Signs of a past heart attack.  Fluid buildup around the heart.  Thickening of the heart muscle.  A tumor or infectious growth around the heart valves. Tell a health care provider about:  Any allergies you have.  All medicines you are taking, including vitamins, herbs, eye drops, creams, and over-the-counter medicines.  Any blood disorders you have.  Any surgeries you have had.  Any medical conditions you have.  Whether you are pregnant or may be pregnant. What are  the risks? Generally, this is a safe procedure. However, problems may occur, including:  Allergic reaction to dye (contrast) that may be used during the procedure. What happens before the procedure? No specific preparation is needed. You may eat and drink normally. What happens during the procedure?   An IV tube may be inserted into one of your veins.  You may receive contrast through this tube. A  contrast is an injection that improves the quality of the pictures from your heart.  A gel will be applied to your chest.  A wand-like tool (transducer) will be moved over your chest. The gel will help to transmit the sound waves from the transducer.  The sound waves will harmlessly bounce off of your heart to allow the heart images to be captured in real-time motion. The images will be recorded on a computer. The procedure may vary among health care providers and hospitals. What happens after the procedure?  You may return to your normal, everyday life, including diet, activities, and medicines, unless your health care provider tells you not to do that. Summary  An echocardiogram is a procedure that uses painless sound waves (ultrasound) to produce an image of the heart.  Images from an echocardiogram can provide important information about the size and shape of your heart, heart muscle function, heart valve function, and fluid buildup around your heart.  You do not need to do anything to prepare before this procedure. You may eat and drink normally.  After the echocardiogram is completed, you may return to your normal, everyday life, unless your health care provider tells you not to do that. This information is not intended to replace advice given to you by your health care provider. Make sure you discuss any questions you have with your health care provider. Document Revised: 09/01/2018 Document Reviewed: 06/13/2016 Elsevier Patient Education  [place].

## 2020-03-17 ENCOUNTER — Other Ambulatory Visit: Payer: Self-pay

## 2020-03-18 MED ORDER — WARFARIN SODIUM 5 MG PO TABS: 5.0000 mg | ORAL_TABLET | Freq: Once | ORAL | 0 refills | Status: DC

## 2020-04-01 ENCOUNTER — Other Ambulatory Visit: Payer: Self-pay

## 2020-04-01 ENCOUNTER — Ambulatory Visit (INDEPENDENT_AMBULATORY_CARE_PROVIDER_SITE_OTHER): Payer: BC Managed Care – PPO

## 2020-04-01 DIAGNOSIS — Z5181 Encounter for therapeutic drug level monitoring: Secondary | ICD-10-CM | POA: Diagnosis not present

## 2020-04-01 DIAGNOSIS — Z952 Presence of prosthetic heart valve: Secondary | ICD-10-CM | POA: Diagnosis not present

## 2020-04-01 LAB — POCT INR: INR: 3.6 — AB (ref 2.0–3.0)

## 2020-04-01 NOTE — Patient Instructions (Signed)
-   skip warfarin tonight, then  - continue of warfarin of 1.5 tablets warfarin every day EXCEPT 1 tablet on MONDAYS & FRIDAYS - recheck INR in 6 weeks.  -get your greens back on schedule

## 2020-04-02 ENCOUNTER — Ambulatory Visit (INDEPENDENT_AMBULATORY_CARE_PROVIDER_SITE_OTHER): Payer: BC Managed Care – PPO

## 2020-04-02 DIAGNOSIS — Z8679 Personal history of other diseases of the circulatory system: Secondary | ICD-10-CM

## 2020-04-02 DIAGNOSIS — Z952 Presence of prosthetic heart valve: Secondary | ICD-10-CM

## 2020-04-02 DIAGNOSIS — Z9889 Other specified postprocedural states: Secondary | ICD-10-CM

## 2020-04-02 MED ORDER — PERFLUTREN LIPID MICROSPHERE
1.0000 mL | INTRAVENOUS | Status: AC | PRN
  Administered 2020-04-02: 2 mL via INTRAVENOUS

## 2020-04-03 LAB — ECHOCARDIOGRAM COMPLETE
AR max vel: 1.11 cm2
AV Area VTI: 1.17 cm2
AV Area mean vel: 1.11 cm2
AV Mean grad: 13.5 mmHg
AV Peak grad: 24.6 mmHg
Ao pk vel: 2.48 m/s
Area-P 1/2: 5.66 cm2
S' Lateral: 2.9 cm
Single Plane A4C EF: 50.8 %

## 2020-04-05 ENCOUNTER — Telehealth: Payer: Self-pay

## 2020-04-05 NOTE — Telephone Encounter (Signed)
Called and left a VM for patient to call back for his Echo results as copied and pasted below:  Normal systolic and diastolic function, normal functioning mechanical aortic valve prosthesis.

## 2020-04-29 ENCOUNTER — Ambulatory Visit: Payer: BC Managed Care – PPO | Admitting: Cardiology

## 2020-04-30 ENCOUNTER — Other Ambulatory Visit: Payer: Self-pay

## 2020-04-30 MED ORDER — WARFARIN SODIUM 5 MG PO TABS
ORAL_TABLET | ORAL | 0 refills | Status: DC
Start: 2020-04-30 — End: 2020-05-30

## 2020-05-20 ENCOUNTER — Other Ambulatory Visit: Payer: Self-pay

## 2020-05-20 ENCOUNTER — Ambulatory Visit (INDEPENDENT_AMBULATORY_CARE_PROVIDER_SITE_OTHER): Payer: BC Managed Care – PPO

## 2020-05-20 ENCOUNTER — Encounter: Payer: BC Managed Care – PPO | Admitting: Internal Medicine

## 2020-05-20 DIAGNOSIS — Z952 Presence of prosthetic heart valve: Secondary | ICD-10-CM

## 2020-05-20 DIAGNOSIS — Z5181 Encounter for therapeutic drug level monitoring: Secondary | ICD-10-CM | POA: Diagnosis not present

## 2020-05-20 LAB — POCT INR: INR: 1.4 — AB (ref 2.0–3.0)

## 2020-05-20 NOTE — Patient Instructions (Signed)
-   take 2 tablets warfarin tonight and tomorrow, then  - continue of warfarin of 1.5 tablets warfarin every day EXCEPT 1 tablet on MONDAYS & FRIDAYS - recheck INR in 6 weeks.  -get your greens back on schedule

## 2020-05-30 ENCOUNTER — Other Ambulatory Visit: Payer: Self-pay

## 2020-05-30 MED ORDER — WARFARIN SODIUM 5 MG PO TABS
ORAL_TABLET | ORAL | 1 refills | Status: DC
Start: 2020-05-30 — End: 2021-03-10

## 2020-05-30 NOTE — Telephone Encounter (Signed)
To [facility] to review. 

## 2020-07-03 ENCOUNTER — Other Ambulatory Visit: Payer: Self-pay

## 2020-07-03 ENCOUNTER — Ambulatory Visit (INDEPENDENT_AMBULATORY_CARE_PROVIDER_SITE_OTHER): Payer: BC Managed Care – PPO

## 2020-07-03 DIAGNOSIS — Z952 Presence of prosthetic heart valve: Secondary | ICD-10-CM

## 2020-07-03 DIAGNOSIS — Z5181 Encounter for therapeutic drug level monitoring: Secondary | ICD-10-CM

## 2020-07-03 LAB — POCT INR: INR: 2.6 (ref 2.0–3.0)

## 2020-07-03 NOTE — Patient Instructions (Signed)
-   continue of warfarin of 1.5 tablets warfarin every day EXCEPT 1 tablet on MONDAYS & FRIDAYS - recheck INR in 6 weeks.

## 2020-08-14 ENCOUNTER — Other Ambulatory Visit: Payer: Self-pay

## 2020-08-14 ENCOUNTER — Ambulatory Visit (INDEPENDENT_AMBULATORY_CARE_PROVIDER_SITE_OTHER): Payer: BC Managed Care – PPO

## 2020-08-14 DIAGNOSIS — Z952 Presence of prosthetic heart valve: Secondary | ICD-10-CM | POA: Diagnosis not present

## 2020-08-14 DIAGNOSIS — Z5181 Encounter for therapeutic drug level monitoring: Secondary | ICD-10-CM

## 2020-08-14 LAB — POCT INR: INR: 2.3 (ref 2.0–3.0)

## 2020-08-14 NOTE — Patient Instructions (Signed)
-   take 2 tablets tonight, then  - continue of warfarin of 1.5 tablets warfarin every day EXCEPT 1 tablet on MONDAYS & FRIDAYS - recheck INR in 6 weeks.

## 2020-08-23 ENCOUNTER — Other Ambulatory Visit: Payer: Self-pay

## 2020-08-23 MED ORDER — ATORVASTATIN CALCIUM 40 MG PO TABS: 40.0000 mg | ORAL_TABLET | Freq: Every day | ORAL | 0 refills | Status: DC

## 2020-08-30 ENCOUNTER — Ambulatory Visit: Payer: Self-pay

## 2020-09-03 ENCOUNTER — Other Ambulatory Visit: Payer: Self-pay

## 2020-09-03 ENCOUNTER — Ambulatory Visit (INDEPENDENT_AMBULATORY_CARE_PROVIDER_SITE_OTHER): Payer: BC Managed Care – PPO | Admitting: Internal Medicine

## 2020-09-03 ENCOUNTER — Encounter: Payer: Self-pay | Admitting: Internal Medicine

## 2020-09-03 VITALS — BP 116/76 | HR 76 | Temp 97.7°F | Ht 67.0 in | Wt 201.0 lb

## 2020-09-03 DIAGNOSIS — Z952 Presence of prosthetic heart valve: Secondary | ICD-10-CM

## 2020-09-03 DIAGNOSIS — E782 Mixed hyperlipidemia: Secondary | ICD-10-CM

## 2020-09-03 DIAGNOSIS — K760 Fatty (change of) liver, not elsewhere classified: Secondary | ICD-10-CM

## 2020-09-03 DIAGNOSIS — L409 Psoriasis, unspecified: Secondary | ICD-10-CM

## 2020-09-03 DIAGNOSIS — Z0000 Encounter for general adult medical examination without abnormal findings: Secondary | ICD-10-CM

## 2020-09-03 DIAGNOSIS — Z7901 Long term (current) use of anticoagulants: Secondary | ICD-10-CM | POA: Diagnosis not present

## 2020-09-03 MED ORDER — CLOBETASOL PROPIONATE 0.05 % EX CREA: 1.0000 "application " | TOPICAL_CREAM | Freq: Two times a day (BID) | CUTANEOUS | 0 refills | Status: DC

## 2020-09-03 NOTE — Progress Notes (Signed)
Date:  09/03/2020   Name:  Henry Duncan   DOB:  08/22/1990   MRN:  [id number]   Chief Complaint: Annual Exam  Henry Duncan is a 30 y.o. male who presents today for his Complete Annual Exam. He feels well. He reports exercising walking x7 days a week. He reports he is sleeping well.    Immunization History  Administered Date(s) Administered  . Hepatitis B, adult 09/02/2015, 11/06/2015, 03/03/2016  . Tdap 09/02/2011    Hyperlipidemia This is a chronic problem. The problem is controlled. Pertinent negatives include no chest pain, myalgias or shortness of breath.  Rash This is a recurrent problem. Location: ear canals. Pertinent negatives include no diarrhea, fatigue or shortness of breath. Past treatments include topical steroids. The treatment provided no relief.    Lab Results  Component Value Date   CREATININE 0.71 (L) 09/01/2019   BUN 11 09/01/2019   NA 139 09/01/2019   K 4.9 09/01/2019   CL 97 09/01/2019   CO2 24 09/01/2019   Lab Results  Component Value Date   CHOL 316 (H) 09/01/2019   HDL 54 09/01/2019   LDLCALC 217 (H) 09/01/2019   TRIG 230 (H) 09/01/2019   CHOLHDL 5.9 (H) 09/01/2019   No results found for: TSH Lab Results  Component Value Date   HGBA1C 5.2 09/16/2018   Lab Results  Component Value Date   WBC 9.6 09/01/2019   HGB 16.8 09/01/2019   HCT 47.8 09/01/2019   MCV 101 (H) 09/01/2019   PLT 236 09/01/2019   Lab Results  Component Value Date   ALT 98 (H) 09/01/2019   AST 87 (H) 09/01/2019   ALKPHOS 122 (H) 09/01/2019   BILITOT 0.4 09/01/2019     Review of Systems  Constitutional: Negative for appetite change, chills, diaphoresis, fatigue and unexpected weight change.  HENT: Negative for hearing loss, tinnitus, trouble swallowing and voice change.   Eyes: Negative for visual disturbance.  Respiratory: Negative for choking, shortness of breath and wheezing.   Cardiovascular: Negative for chest pain, palpitations and leg  swelling.  Gastrointestinal: Negative for abdominal pain, blood in stool, constipation and diarrhea.  Genitourinary: Negative for difficulty urinating, dysuria and frequency.  Musculoskeletal: Negative for arthralgias, back pain and myalgias.  Skin: Negative for color change and rash.  Neurological: Negative for dizziness, syncope and headaches.  Hematological: Negative for adenopathy.  Psychiatric/Behavioral: Negative for dysphoric mood and sleep disturbance.    Patient Active Problem List   Diagnosis Date Noted  . Warfarin anticoagulation 03/15/2019  . Encounter for therapeutic drug monitoring 09/29/2018  . Aortic valve prosthesis present 09/29/2018  . S/P ascending aortic aneurysm repair 09/19/2018  . Allergic rhinitis 09/06/2018  . Hepatic steatosis 10/30/2016  . Insomnia 11/29/2015  . Migraine 11/29/2015  . GERD (gastroesophageal reflux disease) 02/11/2015    No Known Allergies  Past Surgical History:  Procedure Laterality Date  . BENTALL PROCEDURE N/A 09/19/2018   Procedure: BENTALL PROCEDURE WITH 23 [place].;  Surgeon: Gaye Pollack, MD;  Location: [facility];  Service: Open Heart Surgery;  Laterality: N/A;  . CARDIAC SURGERY    . CLEFT LIP REPAIR    . SUBXYPHOID PERICARDIAL WINDOW N/A 09/26/2018   Procedure: SUBXYPHOID PERICARDIAL WINDOW;  Surgeon: Gaye Pollack, MD;  Location: [facility];  Service: Thoracic;  Laterality: N/A;  . TEE WITHOUT CARDIOVERSION N/A 09/19/2018   Procedure: TRANSESOPHAGEAL ECHOCARDIOGRAM (TEE);  Surgeon: Gaye Pollack, MD;  Location: [facility];  Service: Open  Heart Surgery;  Laterality: N/A;    Social History   Tobacco Use  . Smoking status: Never Smoker  . Smokeless tobacco: Never Used  Vaping Use  . Vaping Use: Never used  Substance Use Topics  . Alcohol use: No    Alcohol/week: 0.0 standard drinks    Comment: pt states on occasion  . Drug use: No     Medication list has been reviewed and updated.  Current Meds  Medication  Sig  . atorvastatin (LIPITOR) 40 MG tablet Take 1 tablet (40 mg total) by mouth daily.  . metoprolol succinate (TOPROL-XL) 50 MG 24 hr tablet Take 1 1/2 tabs (75mg ) by mouth twice a day  . triamcinolone (KENALOG) 0.025 % cream APPLY  CREAM TOPICALLY TO AFFECTED AREA (GENITALS) TWICE DAILY  . triamcinolone ointment (KENALOG) 0.1 % Apply topically 2 (two) times daily.  Marland Kitchen warfarin (COUMADIN) 5 MG tablet Take 1.5 tablets daily except 1 tablet on Monday and Friday or as directed by the Anticoagulation Clinic.    PHQ 2/9 Scores 09/03/2020 09/01/2019 03/15/2019 02/11/2015  PHQ - 2 Score 0 0 0 0  PHQ- 9 Score 0 0 - -    GAD 7 : Generalized Anxiety Score 09/03/2020 09/01/2019  Nervous, Anxious, on Edge 0 0  Control/stop worrying 0 0  Worry too much - different things 0 0  Trouble relaxing 0 0  Restless 0 0  Easily annoyed or irritable 0 0  Afraid - awful might happen 0 0  Total GAD 7 Score 0 0  Anxiety Difficulty - Not difficult at all    BP Readings from Last 3 Encounters:  09/03/20 116/76  03/08/20 112/80  10/11/19 (!) 144/95    Physical Exam Vitals and nursing note reviewed.  Constitutional:      Appearance: Normal appearance. He is well-developed.  HENT:     Head: Normocephalic.     Right Ear: Tympanic membrane, ear canal and external ear normal.     Left Ear: Tympanic membrane, ear canal and external ear normal.     Nose: Nose normal.  Eyes:     Conjunctiva/sclera: Conjunctivae normal.     Pupils: Pupils are equal, round, and reactive to light.  Neck:     Thyroid: No thyromegaly.     Vascular: No carotid bruit.  Cardiovascular:     Rate and Rhythm: Normal rate and regular rhythm.     Heart sounds: Murmur heard.   Systolic murmur is present with a grade of 2/6.     Comments: Mechanical valve click throughout precordium Pulmonary:     Effort: Pulmonary effort is normal.     Breath sounds: Normal breath sounds. No wheezing.  Chest:     Chest wall: No deformity or tenderness.   Breasts:     Right: No mass.     Left: No mass.      Comments: Well healed median sternotomy scar Abdominal:     General: Bowel sounds are normal.     Palpations: Abdomen is soft.     Tenderness: There is no abdominal tenderness.  Musculoskeletal:        General: Normal range of motion.     Cervical back: Normal range of motion and neck supple.  Lymphadenopathy:     Cervical: No cervical adenopathy.  Skin:    General: Skin is warm and dry.  Neurological:     Mental Status: He is alert and oriented to person, place, and time.     Deep Tendon Reflexes:  Reflexes are normal and symmetric.  Psychiatric:        Attention and Perception: Attention normal.        Mood and Affect: Mood normal.        Thought Content: Thought content normal.     Wt Readings from Last 3 Encounters:  09/03/20 201 lb (91.2 kg)  03/08/20 191 lb (86.6 kg)  10/11/19 190 lb (86.2 kg)    BP 116/76   Pulse 76   Temp 97.7 F (36.5 C) (Oral)   Ht 5\' 7"  (1.702 m)   Wt 201 lb (91.2 kg)   SpO2 96%   BMI 31.48 kg/m   Assessment and Plan: 1. Annual physical exam Exam is normal except for weight. Encourage regular exercise and appropriate dietary changes. - CBC with Differential/Platelet - TSH  2. Aortic valve prosthesis present On warfarin; titrated by cardiology  3. Warfarin anticoagulation Last INR 2.4  4. Mixed hyperlipidemia On statin therapy - Lipid panel  5. Hepatic steatosis Weight loss recommended - Comprehensive metabolic panel  6. Psoriasis If no improvement with stronger steroid, will need to see Dermatology - clobetasol cream (TEMOVATE) 0.05 %; Apply 1 application topically 2 (two) times daily. To external ear canals  Dispense: 30 g; Refill: 0   Partially dictated using Editor, commissioning. Any errors are unintentional.  Halina Maidens, MD [facility]  09/03/2020

## 2020-09-04 LAB — CBC WITH DIFFERENTIAL/PLATELET
Basophils Absolute: 0.1 10*3/uL (ref 0.0–0.2)
Basos: 1 %
EOS (ABSOLUTE): 0.1 10*3/uL (ref 0.0–0.4)
Eos: 2 %
Hematocrit: 46.6 % (ref 37.5–51.0)
Hemoglobin: 15.9 g/dL (ref 13.0–17.7)
Immature Grans (Abs): 0 10*3/uL (ref 0.0–0.1)
Immature Granulocytes: 1 %
Lymphocytes Absolute: 2 10*3/uL (ref 0.7–3.1)
Lymphs: 24 %
MCH: 31.9 pg (ref 26.6–33.0)
MCHC: 34.1 g/dL (ref 31.5–35.7)
MCV: 94 fL (ref 79–97)
Monocytes Absolute: 0.8 10*3/uL (ref 0.1–0.9)
Monocytes: 10 %
Neutrophils Absolute: 5.2 10*3/uL (ref 1.4–7.0)
Neutrophils: 62 %
Platelets: 232 10*3/uL (ref 150–450)
RBC: 4.98 x10E6/uL (ref 4.14–5.80)
RDW: 12.7 % (ref 11.6–15.4)
WBC: 8.3 10*3/uL (ref 3.4–10.8)

## 2020-09-04 LAB — LIPID PANEL
Chol/HDL Ratio: 5.2 ratio — ABNORMAL HIGH (ref 0.0–5.0)
Cholesterol, Total: 170 mg/dL (ref 100–199)
HDL: 33 mg/dL — ABNORMAL LOW (ref >39)
LDL Chol Calc (NIH): 111 mg/dL — ABNORMAL HIGH (ref 0–99)
Triglycerides: 146 mg/dL (ref 0–149)
VLDL Cholesterol Cal: 26 mg/dL (ref 5–40)

## 2020-09-04 LAB — COMPREHENSIVE METABOLIC PANEL
ALT: 47 IU/L — ABNORMAL HIGH (ref 0–44)
AST: 30 IU/L (ref 0–40)
Albumin/Globulin Ratio: 1.7 (ref 1.2–2.2)
Albumin: 4.5 g/dL (ref 4.1–5.2)
Alkaline Phosphatase: 106 IU/L (ref 44–121)
BUN/Creatinine Ratio: 13 (ref 9–20)
BUN: 10 mg/dL (ref 6–20)
Bilirubin Total: 0.4 mg/dL (ref 0.0–1.2)
CO2: 21 mmol/L (ref 20–29)
Calcium: 9.6 mg/dL (ref 8.7–10.2)
Chloride: 99 mmol/L (ref 96–106)
Creatinine, Ser: 0.77 mg/dL (ref 0.76–1.27)
Globulin, Total: 2.7 g/dL (ref 1.5–4.5)
Glucose: 98 mg/dL (ref 65–99)
Potassium: 4.8 mmol/L (ref 3.5–5.2)
Sodium: 140 mmol/L (ref 134–144)
Total Protein: 7.2 g/dL (ref 6.0–8.5)
eGFR: 124 mL/min/{1.73_m2} (ref >59)

## 2020-09-04 LAB — TSH: TSH: 0.559 u[IU]/mL (ref 0.450–4.500)

## 2020-09-08 ENCOUNTER — Encounter: Payer: Self-pay | Admitting: Internal Medicine

## 2020-09-10 DIAGNOSIS — I712 Thoracic aortic aneurysm, without rupture, unspecified: Secondary | ICD-10-CM

## 2020-09-24 ENCOUNTER — Other Ambulatory Visit: Payer: Self-pay

## 2020-09-24 MED ORDER — ATORVASTATIN CALCIUM 40 MG PO TABS: 40.0000 mg | ORAL_TABLET | Freq: Every day | ORAL | 3 refills | Status: DC

## 2020-09-25 ENCOUNTER — Ambulatory Visit (INDEPENDENT_AMBULATORY_CARE_PROVIDER_SITE_OTHER): Payer: BC Managed Care – PPO

## 2020-09-25 ENCOUNTER — Other Ambulatory Visit: Payer: Self-pay

## 2020-09-25 DIAGNOSIS — Z952 Presence of prosthetic heart valve: Secondary | ICD-10-CM

## 2020-09-25 DIAGNOSIS — Z5181 Encounter for therapeutic drug level monitoring: Secondary | ICD-10-CM | POA: Diagnosis not present

## 2020-09-25 LAB — POCT INR: INR: 1.1 — AB (ref 2.0–3.0)

## 2020-09-25 NOTE — Patient Instructions (Signed)
-   take 2 tablets tonight, tomorrow and Friday, then - START NEW DOSAGE of warfarin of 1.5 tablets warfarin every day EXCEPT 2 tablets on MONDAYS & FRIDAYS - recheck INR in 2 weeks

## 2020-09-26 ENCOUNTER — Other Ambulatory Visit: Payer: Self-pay | Admitting: Cardiology

## 2020-09-26 NOTE — Telephone Encounter (Signed)
VM full

## 2020-09-26 NOTE — Telephone Encounter (Signed)
Please call pt to schedule overdue follow-up appointment with Dr. Garen Lah for refills. Pt was supposed to follow-up after his echocardiogram. Thank you!  Received fax from [facility] requesting refills for Atorvastatin 40 mg.

## 2020-10-02 NOTE — Telephone Encounter (Signed)
Opened in error

## 2020-10-09 ENCOUNTER — Other Ambulatory Visit: Payer: Self-pay

## 2020-10-09 ENCOUNTER — Ambulatory Visit (INDEPENDENT_AMBULATORY_CARE_PROVIDER_SITE_OTHER): Payer: BC Managed Care – PPO

## 2020-10-09 DIAGNOSIS — Z5181 Encounter for therapeutic drug level monitoring: Secondary | ICD-10-CM | POA: Diagnosis not present

## 2020-10-09 DIAGNOSIS — Z952 Presence of prosthetic heart valve: Secondary | ICD-10-CM

## 2020-10-09 LAB — POCT INR: INR: 1.7 — AB (ref 2.0–3.0)

## 2020-10-09 NOTE — Patient Instructions (Addendum)
-   take 10 mg today, tomorrow and Friday,  - START NEW DOSAGE of warfarin of 1.5 tablets warfarin every day EXCEPT 2 tablets on MONDAYS, WEDNESDAYS & FRIDAYS - recheck INR in 2 weeks

## 2020-10-16 DIAGNOSIS — G4733 Obstructive sleep apnea (adult) (pediatric): Secondary | ICD-10-CM | POA: Diagnosis not present

## 2020-10-23 ENCOUNTER — Other Ambulatory Visit: Payer: Self-pay

## 2020-10-23 ENCOUNTER — Ambulatory Visit (INDEPENDENT_AMBULATORY_CARE_PROVIDER_SITE_OTHER): Payer: BC Managed Care – PPO

## 2020-10-23 DIAGNOSIS — Z5181 Encounter for therapeutic drug level monitoring: Secondary | ICD-10-CM

## 2020-10-23 DIAGNOSIS — Z952 Presence of prosthetic heart valve: Secondary | ICD-10-CM

## 2020-10-23 LAB — POCT INR: INR: 3.8 — AB (ref 2.0–3.0)

## 2020-10-23 NOTE — Patient Instructions (Signed)
-   skip warfarin today - resume your veggies and try to get them on a weekly schedule - then resume dosage of warfarin of 1.5 tablets warfarin every day EXCEPT 2 tablets on MONDAYS, [place] - recheck INR in 3 weeks

## 2020-11-11 ENCOUNTER — Other Ambulatory Visit: Payer: Self-pay | Admitting: *Deleted

## 2020-11-11 MED ORDER — ATORVASTATIN CALCIUM 40 MG PO TABS: 40.0000 mg | ORAL_TABLET | Freq: Every day | ORAL | 0 refills | Status: DC

## 2020-11-11 MED ORDER — METOPROLOL SUCCINATE ER 50 MG PO TB24: ORAL_TABLET | ORAL | 0 refills | Status: DC

## 2020-11-11 NOTE — Telephone Encounter (Signed)
Patient scheduled first available appointment with Angelica Ran on 07/07 - patient will be out of medication before appointment.

## 2020-11-11 NOTE — Telephone Encounter (Signed)
Refill has been sent for both Metoprolol and Atorvastin for 30 days only until pt has f/u.

## 2020-11-13 ENCOUNTER — Other Ambulatory Visit: Payer: Self-pay

## 2020-11-13 ENCOUNTER — Ambulatory Visit (INDEPENDENT_AMBULATORY_CARE_PROVIDER_SITE_OTHER): Payer: BC Managed Care – PPO

## 2020-11-13 DIAGNOSIS — Z5181 Encounter for therapeutic drug level monitoring: Secondary | ICD-10-CM | POA: Diagnosis not present

## 2020-11-13 DIAGNOSIS — Z952 Presence of prosthetic heart valve: Secondary | ICD-10-CM | POA: Diagnosis not present

## 2020-11-13 LAB — POCT INR: INR: 2.6 (ref 2.0–3.0)

## 2020-11-13 NOTE — Telephone Encounter (Signed)
Scheduled

## 2020-11-13 NOTE — Patient Instructions (Signed)
-   continue dosage of warfarin of 1.5 tablets warfarin every day EXCEPT 2 tablets on MONDAYS, WEDNESDAYS & FRIDAYS - recheck INR in 4 weeks

## 2020-11-26 ENCOUNTER — Ambulatory Visit: Payer: BC Managed Care – PPO | Admitting: Podiatry

## 2020-11-28 ENCOUNTER — Other Ambulatory Visit: Payer: Self-pay

## 2020-11-28 ENCOUNTER — Encounter: Payer: Self-pay | Admitting: Nurse Practitioner

## 2020-11-28 ENCOUNTER — Ambulatory Visit (INDEPENDENT_AMBULATORY_CARE_PROVIDER_SITE_OTHER): Payer: BC Managed Care – PPO | Admitting: Nurse Practitioner

## 2020-11-28 VITALS — BP 100/80 | HR 65 | Ht 67.0 in | Wt 188.0 lb

## 2020-11-28 DIAGNOSIS — Z952 Presence of prosthetic heart valve: Secondary | ICD-10-CM

## 2020-11-28 DIAGNOSIS — I712 Thoracic aortic aneurysm, without rupture, unspecified: Secondary | ICD-10-CM

## 2020-11-28 DIAGNOSIS — I10 Essential (primary) hypertension: Secondary | ICD-10-CM

## 2020-11-28 DIAGNOSIS — E782 Mixed hyperlipidemia: Secondary | ICD-10-CM | POA: Diagnosis not present

## 2020-11-28 DIAGNOSIS — R931 Abnormal findings on diagnostic imaging of heart and coronary circulation: Secondary | ICD-10-CM

## 2020-11-28 NOTE — Progress Notes (Signed)
Office Visit    Patient Name: Henry Duncan Date of Encounter: 11/28/2020  Primary Care Provider:  Glean Hess, MD Primary Cardiologist:  Kate Sable, MD  Chief Complaint    30 year old male with history of hypertension, bicuspid aortic valve, ascending thoracic aortic aneurysm and dissection status post Bentall procedure with mechanical AVR in April 2020, postoperative pericardial effusion requiring window, and anxiety, who presents for follow-up of hypertension and AVR.  Past Medical History    Past Medical History:  Diagnosis Date   Anxiety    Aortic aneurysm ([place]) 09/16/2018   a. 08/2018 Asc Ao/root aneuryms w/ dissection-->s/p Bentall w/ 17mm Hemashield graft, 11mm SJM AVR, and reimplantation of cor arteries.   CAD (coronary artery disease)    a. 08/2018 Cor CTA: Ca2+ 99th%'ile (476). Calcified plaque in mLAD. Nl LM, LCX, RCA. S/p reimplantation of coronary arteries in setting of Bentall.   H/O mechanical aortic valve replacement    a. 08/2018 in setting of bicuspid AoV and Asc Ao dissection-->52mm SJM mech AVR; b. 03/2020 Echo: EF 55-60%. No rwma. Mild LVH. Nl RV fxn. Nl functioning AVR.   Hypertension    Pericardial effusion    a. 08/2018 post-op -->s/p window.   Past Surgical History:  Procedure Laterality Date   BENTALL PROCEDURE N/A 09/19/2018   Procedure: BENTALL PROCEDURE WITH 23 [place].;  Surgeon: Gaye Pollack, MD;  Location: [facility];  Service: Open Heart Surgery;  Laterality: N/A;   CARDIAC SURGERY     CLEFT LIP REPAIR     SUBXYPHOID PERICARDIAL WINDOW N/A 09/26/2018   Procedure: SUBXYPHOID PERICARDIAL WINDOW;  Surgeon: Gaye Pollack, MD;  Location: [facility];  Service: Thoracic;  Laterality: N/A;   TEE WITHOUT CARDIOVERSION N/A 09/19/2018   Procedure: TRANSESOPHAGEAL ECHOCARDIOGRAM (TEE);  Surgeon: Gaye Pollack, MD;  Location: [facility];  Service: Open Heart Surgery;  Laterality: N/A;    Allergies  No Known Allergies  History of  Present Illness    30 year old male with the above past medical history including hypertension, bicuspid aortic valve, ascending thoracic aortic aneurysm and dissection status post Bentall procedure with mechanical AVR in April 2020, postoperative pericardial effusion requiring window, and anxiety.  In April 2020, he presented to [facility] with chest pain and hypertensive urgency.  CT of the chest showed an ascending thoracic aortic aneurysm measuring 5.7 to 5.8 cm, initially without evidence of dissection.  He was subsequently referred for CT surgical evaluation and on September 16, 2018 underwent CT coronary angiography which showed moderate to severe mid LAD disease, though there was a question of artifact, as well as ascending aortic/root aneurysm with dissection flap.  Patient was admitted and subsequently underwent Bentall procedure with mechanical AVR and reimplantation of the coronary arteries.  Postoperative course was complicated by pericardial effusion requiring window.  He has since been maintained on warfarin therapy and is followed closely in our anticoagulation clinic.  CT angio of the chest in May 2021 showed normal caliber aorta.  He was last seen in cardiology clinic in October 2021 at which time he was doing well.  Echocardiogram in November 2021 showed normal LV function with normal functioning mechanical AVR.  Since his last visit, he has felt well.  He lives locally and works @ [employer].  There is is some exertional component to his job and he is able to tolerate this without limitations.  He denies chest pain, dyspnea, palpitations, PND, orthopnea, dizziness, syncope, edema, or early  satiety.  Home Medications    Prior to Admission medications   Medication Sig Start Date End Date Taking? Authorizing Provider  atorvastatin (LIPITOR) 40 MG tablet Take 1 tablet (40 mg total) by mouth daily. 11/11/20 12/11/20  Kate Sable, MD  clobetasol cream (TEMOVATE) 2.87 %  Apply 1 application topically 2 (two) times daily. To external ear canals 09/03/20   Glean Hess, MD  metoprolol succinate (TOPROL-XL) 50 MG 24 hr tablet Take 1 1/2 tabs (75mg ) by mouth twice a day 11/11/20   Kate Sable, MD  warfarin (COUMADIN) 5 MG tablet Take 1.5 tablets daily except 1 tablet on Monday and Friday or as directed by the Anticoagulation Clinic. 05/30/20   Kate Sable, MD  cetirizine (ZYRTEC) 10 MG tablet Take 10 mg by mouth daily as needed for allergies.  03/15/19  [provider]    Review of Systems    Doing well.  He denies chest pain, palpitations, dyspnea, pnd, orthopnea, n, v, dizziness, syncope, edema, weight gain, or early satiety.  All other systems reviewed and are otherwise negative except as noted above.  Physical Exam    VS:  BP 100/80 (BP Location: Left Arm, Patient Position: Sitting, Cuff Size: Large)   Pulse 65   Ht 5\' 7"  (1.702 m)   Wt 188 lb (85.3 kg)   SpO2 98%   BMI 29.44 kg/m  , BMI Body mass index is 29.44 kg/m. GEN: Well nourished, well developed, in no acute distress. HEENT: normal. Neck: Supple, no JVD, carotid bruits, or masses. Cardiac: RRR, 2/6 systolic ejection murmur loudest at the upper sternal borders with mechanical S2.  No rubs, or gallops. No clubbing, cyanosis, edema.  Radials/PT 2+ and equal bilaterally.  Respiratory:  Respirations regular and unlabored, clear to auscultation bilaterally. GI: Soft, nontender, nondistended, BS + x 4. MS: no deformity or atrophy. Skin: warm and dry, no rash. Neuro:  Strength and sensation are intact. Psych: Normal affect.  Accessory Clinical Findings    ECG personally reviewed by me today -regular sinus rhythm, 65- no acute changes.  Lab Results  Component Value Date   WBC 8.3 09/03/2020   HGB 15.9 09/03/2020   HCT 46.6 09/03/2020   MCV 94 09/03/2020   PLT 232 09/03/2020   Lab Results  Component Value Date   CREATININE 0.77 09/03/2020   BUN 10 09/03/2020   NA  140 09/03/2020   K 4.8 09/03/2020   CL 99 09/03/2020   CO2 21 09/03/2020   Lab Results  Component Value Date   ALT 47 (H) 09/03/2020   AST 30 09/03/2020   ALKPHOS 106 09/03/2020   BILITOT 0.4 09/03/2020   Lab Results  Component Value Date   CHOL 170 09/03/2020   HDL 33 (L) 09/03/2020   LDLCALC 111 (H) 09/03/2020   TRIG 146 09/03/2020   CHOLHDL 5.2 (H) 09/03/2020    Lab Results  Component Value Date   HGBA1C 5.2 09/16/2018    Assessment & Plan    1.  History of ascending aortic aneurysm with dissection: Status post Bentall procedure with aortic root replacement, mechanical AVR, and resuspension of coronary arteries and April 2020.  He has been doing well without chest pain or dyspnea.  CT angio in May 2021 showed normal aortic graft without postoperative complications.  He is overdue for CTA.  I will follow-up a basic metabolic panel today and arrange for CTA.  Continue beta-blocker therapy.  2.  History of bicuspid aortic valve status post mechanical aortic valve  replacement: As above, he has been doing well without limitations in activities.  Echo in November showed normal LV function and normal functioning aortic valve.  He remains on warfarin and is followed closely in our anticoagulation clinic.  3.  Hyperlipidemia: LDL was 217 2021 and he was subsequently placed on Lipitor with improvement in LDL to 111.  Encourage further lifestyle modifications.  4.  Coronary artery calcium: CT coronary angiogram prior to surgery 2020 showed elevated coronary calcium score of 476 placing him in the 99th percentile.  There was question of plaque in the mid LAD.  He is status post resuspension of coronary arteries at the time of Bentall in April 2020.  He is now on statin therapy was significantly improved LDL, down to 111.  No limitations in activities.  Encourage diet and exercise.  5.  Disposition: Follow-up basic metabolic panel today plan for CT angio of the aorta.  Pending results,  follow-up in clinic in 1 year or sooner if necessary.  He will continue to follow-up in anticoagulation clinic regularly.  Murray Hodgkins, NP 11/28/2020, 8:48 AM

## 2020-11-28 NOTE — Patient Instructions (Addendum)
Medication Instructions:  No changes at this time.  *If you need a refill on your cardiac medications before your next appointment, please call your pharmacy*   Lab Work: BMP done today  If you have labs (blood work) drawn today and your tests are completely normal, you will receive your results only by: [place] (if you have MyChart) OR A paper copy in the mail If you have any lab test that is abnormal or we need to change your treatment, we will call you to review the results.   Testing/Procedures: Non-Cardiac CT Angiography (CTA), is a special type of CT scan that uses a computer to produce multi-dimensional views of major blood vessels throughout the body. In CT angiography, a contrast material is injected through an IV to help visualize the blood vessels  Please call [phone] to schedule this test.     Follow-Up: At [facility], you and your health needs are our priority.  As part of our continuing mission to provide you with exceptional heart care, we have created designated Provider Care Teams.  These Care Teams include your primary Cardiologist (physician) and Advanced Practice Providers (APPs -  Physician Assistants and Nurse Practitioners) who all work together to provide you with the care you need, when you need it.    Your next appointment:   1 year(s)  The format for your next appointment:   In Person  Provider:   Kate Sable MD

## 2020-11-29 LAB — BASIC METABOLIC PANEL
BUN/Creatinine Ratio: 22 — ABNORMAL HIGH (ref 9–20)
BUN: 12 mg/dL (ref 6–20)
CO2: 23 mmol/L (ref 20–29)
Calcium: 9.1 mg/dL (ref 8.7–10.2)
Chloride: 104 mmol/L (ref 96–106)
Creatinine, Ser: 0.55 mg/dL — ABNORMAL LOW (ref 0.76–1.27)
Glucose: 96 mg/dL (ref 65–99)
Potassium: 4.4 mmol/L (ref 3.5–5.2)
Sodium: 142 mmol/L (ref 134–144)
eGFR: 138 mL/min/{1.73_m2} (ref >59)

## 2020-12-05 ENCOUNTER — Other Ambulatory Visit: Payer: Self-pay

## 2020-12-05 ENCOUNTER — Ambulatory Visit (INDEPENDENT_AMBULATORY_CARE_PROVIDER_SITE_OTHER): Payer: BC Managed Care – PPO | Admitting: Podiatry

## 2020-12-05 ENCOUNTER — Encounter: Payer: Self-pay | Admitting: Podiatry

## 2020-12-05 DIAGNOSIS — L600 Ingrowing nail: Secondary | ICD-10-CM

## 2020-12-05 MED ORDER — ATORVASTATIN CALCIUM 40 MG PO TABS: 40.0000 mg | ORAL_TABLET | Freq: Every day | ORAL | 0 refills | Status: DC

## 2020-12-05 MED ORDER — METOPROLOL SUCCINATE ER 50 MG PO TB24: ORAL_TABLET | ORAL | 0 refills | Status: DC

## 2020-12-05 NOTE — Progress Notes (Signed)
Subjective:  Patient ID: Henry Duncan, male    DOB: 13-Jun-1990,  MRN: [id number]  Chief Complaint  Patient presents with   Ingrown Toenail    Possible ingrown     30 y.o. male presents with the above complaint.  Patient presents with complaint of right hallux medial border ingrown.  Patient states painful to touch.  Patient states he has tried self debridement down.  It may have an infection.  Patient would like to have it removed.  He had a history of ingrown done to the nail few years ago.  He denies any other acute complaints.  He has not seen anyone else prior to seeing me.   Review of Systems: Negative except as noted in the HPI. Denies N/V/F/Ch.  Past Medical History:  Diagnosis Date   Anxiety    Aortic aneurysm ([place]) 09/16/2018   a. 08/2018 Asc Ao/root aneuryms w/ dissection-->s/p Bentall w/ 29mm Hemashield graft, 46mm SJM AVR, and reimplantation of cor arteries.   CAD (coronary artery disease)    a. 08/2018 Cor CTA: Ca2+ 99th%'ile (476). Calcified plaque in mLAD. Nl LM, LCX, RCA. S/p reimplantation of coronary arteries in setting of Bentall.   H/O mechanical aortic valve replacement    a. 08/2018 in setting of bicuspid AoV and Asc Ao dissection-->55mm SJM mech AVR; b. 03/2020 Echo: EF 55-60%. No rwma. Mild LVH. Nl RV fxn. Nl functioning AVR.   Hypertension    Pericardial effusion    a. 08/2018 post-op -->s/p window.    Current Outpatient Medications:    atorvastatin (LIPITOR) 40 MG tablet, Take 1 tablet (40 mg total) by mouth daily., Disp: 30 tablet, Rfl: 0   cetirizine (ZYRTEC) 10 MG tablet, Take 10 mg by mouth daily as needed for allergies., Disp: , Rfl:    clobetasol cream (TEMOVATE) 6.57 %, Apply 1 application topically 2 (two) times daily. To external ear canals, Disp: 30 g, Rfl: 0   metoprolol succinate (TOPROL-XL) 50 MG 24 hr tablet, Take 1 1/2 tabs (75mg ) by mouth twice a day, Disp: 90 tablet, Rfl: 0   warfarin (COUMADIN) 5 MG tablet, Take 1.5 tablets daily  except 1 tablet on Monday and Friday or as directed by the Anticoagulation Clinic., Disp: 120 tablet, Rfl: 1  Social History   Tobacco Use  Smoking Status Never  Smokeless Tobacco Never    No Known Allergies Objective:  There were no vitals filed for this visit. There is no height or weight on file to calculate BMI. Constitutional Well developed. Well nourished.  Vascular Dorsalis pedis pulses palpable bilaterally. Posterior tibial pulses palpable bilaterally. Capillary refill normal to all digits.  No cyanosis or clubbing noted. Pedal hair growth normal.  Neurologic Normal speech. Oriented to person, place, and time. Epicritic sensation to light touch grossly present bilaterally.  Dermatologic Painful ingrowing nail at medial nail borders of the hallux nail right. No other open wounds. No skin lesions.  Orthopedic: Normal joint ROM without pain or crepitus bilaterally. No visible deformities. No bony tenderness.   Radiographs: None Assessment:   1. Ingrown nail of great toe of right foot    Plan:  Patient was evaluated and treated and all questions answered.  Ingrown Nail, right -Patient elects to proceed with minor surgery to remove ingrown toenail removal today. Consent reviewed and signed by patient. -Ingrown nail excised. See procedure note. -Educated on post-procedure care including soaking. Written instructions provided and reviewed. -Patient to follow up in 2 weeks for nail check.  Procedure: Excision of  Ingrown Toenail Location: Right 1st toe medial nail borders. Anesthesia: Lidocaine 1% plain; 1.5 mL and Marcaine 0.5% plain; 1.5 mL, digital block. Skin Prep: Betadine. Dressing: Silvadene; telfa; dry, sterile, compression dressing. Technique: Following skin prep, the toe was exsanguinated and a tourniquet was secured at the base of the toe. The affected nail border was freed, split with a nail splitter, and excised. Chemical matrixectomy was then performed  with phenol and irrigated out with alcohol. The tourniquet was then removed and sterile dressing applied. Disposition: Patient tolerated procedure well. Patient to return in 2 weeks for follow-up.   No follow-ups on file.

## 2020-12-09 ENCOUNTER — Ambulatory Visit
Admission: RE | Admit: 2020-12-09 | Discharge: 2020-12-09 | Disposition: A | Discharge status: Home / Self Care | Payer: BC Managed Care – PPO | Source: Ambulatory Visit | Attending: Nurse Practitioner | Admitting: Nurse Practitioner

## 2020-12-09 ENCOUNTER — Other Ambulatory Visit: Payer: Self-pay

## 2020-12-09 DIAGNOSIS — I2510 Atherosclerotic heart disease of native coronary artery without angina pectoris: Secondary | ICD-10-CM | POA: Diagnosis not present

## 2020-12-09 DIAGNOSIS — I712 Thoracic aortic aneurysm, without rupture, unspecified: Secondary | ICD-10-CM

## 2020-12-09 DIAGNOSIS — Z9889 Other specified postprocedural states: Secondary | ICD-10-CM | POA: Diagnosis not present

## 2020-12-09 MED ORDER — IOHEXOL 350 MG/ML SOLN
100.0000 mL | Freq: Once | INTRAVENOUS | Status: AC | PRN
  Administered 2020-12-09: 100 mL via INTRAVENOUS

## 2020-12-10 ENCOUNTER — Telehealth: Payer: Self-pay | Admitting: *Deleted

## 2020-12-10 NOTE — Telephone Encounter (Signed)
-----   Message from Theora Gianotti, NP sent at 12/09/2020  5:44 PM EDT ----- Stable CT chest w/o any new findings.

## 2020-12-10 NOTE — Telephone Encounter (Signed)
No answer/Voicemail box is full.  

## 2020-12-10 NOTE — Telephone Encounter (Signed)
Patient replied to My Chart message with no further questions.

## 2020-12-11 ENCOUNTER — Ambulatory Visit (INDEPENDENT_AMBULATORY_CARE_PROVIDER_SITE_OTHER): Payer: BC Managed Care – PPO

## 2020-12-11 ENCOUNTER — Other Ambulatory Visit: Payer: Self-pay

## 2020-12-11 DIAGNOSIS — Z5181 Encounter for therapeutic drug level monitoring: Secondary | ICD-10-CM | POA: Diagnosis not present

## 2020-12-11 DIAGNOSIS — Z952 Presence of prosthetic heart valve: Secondary | ICD-10-CM

## 2020-12-11 LAB — POCT INR: INR: 3.5 — AB (ref 2.0–3.0)

## 2020-12-11 NOTE — Patient Instructions (Signed)
-   take 1 tablet warfarin tonight, then  - continue dosage of warfarin of 1.5 tablets warfarin every day EXCEPT 2 tablets on MONDAYS, WEDNESDAYS & FRIDAYS - recheck INR in 4 weeks

## 2021-01-08 ENCOUNTER — Other Ambulatory Visit: Payer: Self-pay

## 2021-01-08 ENCOUNTER — Ambulatory Visit (INDEPENDENT_AMBULATORY_CARE_PROVIDER_SITE_OTHER): Payer: BC Managed Care – PPO

## 2021-01-08 DIAGNOSIS — Z5181 Encounter for therapeutic drug level monitoring: Secondary | ICD-10-CM

## 2021-01-08 DIAGNOSIS — Z952 Presence of prosthetic heart valve: Secondary | ICD-10-CM

## 2021-01-08 LAB — POCT INR: INR: 3.2 — AB (ref 2.0–3.0)

## 2021-01-08 NOTE — Patient Instructions (Signed)
-   take 1 tablet warfarin tonight, then  - continue dosage of warfarin of 1.5 tablets warfarin every day EXCEPT 2 tablets on MONDAYS, WEDNESDAYS & FRIDAYS - recheck INR in 3 weeks

## 2021-02-05 ENCOUNTER — Ambulatory Visit (INDEPENDENT_AMBULATORY_CARE_PROVIDER_SITE_OTHER): Payer: BC Managed Care – PPO

## 2021-02-05 ENCOUNTER — Other Ambulatory Visit: Payer: Self-pay

## 2021-02-05 DIAGNOSIS — Z952 Presence of prosthetic heart valve: Secondary | ICD-10-CM | POA: Diagnosis not present

## 2021-02-05 DIAGNOSIS — Z5181 Encounter for therapeutic drug level monitoring: Secondary | ICD-10-CM

## 2021-02-05 LAB — POCT INR: INR: 4.4 — AB (ref 2.0–3.0)

## 2021-02-05 NOTE — Patient Instructions (Signed)
-   skip warfarin tonight, take 1 tablet tomorrow, then  - START NEW DOSAGE of warfarin of 1.5 tablets warfarin every day - recheck INR in 3 weeks

## 2021-02-26 ENCOUNTER — Other Ambulatory Visit: Payer: Self-pay

## 2021-02-26 ENCOUNTER — Ambulatory Visit (INDEPENDENT_AMBULATORY_CARE_PROVIDER_SITE_OTHER): Payer: BC Managed Care – PPO

## 2021-02-26 DIAGNOSIS — Z952 Presence of prosthetic heart valve: Secondary | ICD-10-CM | POA: Diagnosis not present

## 2021-02-26 DIAGNOSIS — Z5181 Encounter for therapeutic drug level monitoring: Secondary | ICD-10-CM

## 2021-02-26 LAB — POCT INR: INR: 1.4 — AB (ref 2.0–3.0)

## 2021-02-26 NOTE — Patient Instructions (Signed)
-   take 2 tablets warfarin tonight and tomorrow, then  - continue of warfarin of 1.5 tablets warfarin every day - recheck INR in 3 weeks

## 2021-03-10 ENCOUNTER — Other Ambulatory Visit: Payer: Self-pay | Admitting: *Deleted

## 2021-03-10 ENCOUNTER — Other Ambulatory Visit: Payer: Self-pay

## 2021-03-10 MED ORDER — METOPROLOL SUCCINATE ER 50 MG PO TB24: ORAL_TABLET | ORAL | 2 refills | Status: DC

## 2021-03-10 MED ORDER — ATORVASTATIN CALCIUM 40 MG PO TABS: 40.0000 mg | ORAL_TABLET | Freq: Every day | ORAL | 2 refills | Status: DC

## 2021-03-10 MED ORDER — WARFARIN SODIUM 5 MG PO TABS
ORAL_TABLET | ORAL | 1 refills | Status: DC
Start: 2021-03-10 — End: 2021-07-11

## 2021-03-19 ENCOUNTER — Ambulatory Visit (INDEPENDENT_AMBULATORY_CARE_PROVIDER_SITE_OTHER): Payer: BC Managed Care – PPO

## 2021-03-19 ENCOUNTER — Other Ambulatory Visit: Payer: Self-pay

## 2021-03-19 DIAGNOSIS — Z5181 Encounter for therapeutic drug level monitoring: Secondary | ICD-10-CM | POA: Diagnosis not present

## 2021-03-19 DIAGNOSIS — Z952 Presence of prosthetic heart valve: Secondary | ICD-10-CM | POA: Diagnosis not present

## 2021-03-19 LAB — POCT INR: INR: 3 (ref 2.0–3.0)

## 2021-03-19 NOTE — Patient Instructions (Signed)
-   continue of warfarin of 1.5 tablets warfarin every day - recheck INR in 4 weeks

## 2021-04-05 ENCOUNTER — Emergency Department
Admission: EM | Admit: 2021-04-05 | Discharge: 2021-04-05 | Disposition: A | Discharge status: Home / Self Care | Payer: BC Managed Care – PPO | Attending: Emergency Medicine | Admitting: Emergency Medicine

## 2021-04-05 ENCOUNTER — Other Ambulatory Visit: Payer: Self-pay

## 2021-04-05 DIAGNOSIS — Z79899 Other long term (current) drug therapy: Secondary | ICD-10-CM | POA: Diagnosis not present

## 2021-04-05 DIAGNOSIS — Y9389 Activity, other specified: Secondary | ICD-10-CM | POA: Diagnosis not present

## 2021-04-05 DIAGNOSIS — Z7901 Long term (current) use of anticoagulants: Secondary | ICD-10-CM | POA: Diagnosis not present

## 2021-04-05 DIAGNOSIS — W500XXA Accidental hit or strike by another person, initial encounter: Secondary | ICD-10-CM | POA: Diagnosis not present

## 2021-04-05 DIAGNOSIS — I10 Essential (primary) hypertension: Secondary | ICD-10-CM | POA: Diagnosis not present

## 2021-04-05 DIAGNOSIS — S01511A Laceration without foreign body of lip, initial encounter: Secondary | ICD-10-CM | POA: Diagnosis not present

## 2021-04-05 DIAGNOSIS — I2510 Atherosclerotic heart disease of native coronary artery without angina pectoris: Secondary | ICD-10-CM | POA: Insufficient documentation

## 2021-04-05 MED ORDER — LIDOCAINE-EPINEPHRINE-TETRACAINE (LET) TOPICAL GEL
3.0000 mL | Freq: Once | TOPICAL | Status: AC
  Administered 2021-04-05: 3 mL via TOPICAL
  Filled 2021-04-05: qty 3

## 2021-04-05 NOTE — ED Provider Notes (Signed)
[facility] Emergency Department Provider Note ____________________________________________  Time seen: 1711  I have reviewed the triage vital signs and the nursing notes.  HISTORY  Chief Complaint  Lip Laceration   HPI Henry Duncan is a 30 y.o. male with a history below including mechanical valve on warfarin anticoagulation therapy, presents to the ED for minor lip injury.  Patient was playing toss with his nephew, when he accidentally got hit in the lip.  He created a small superficial abrasion to the lip which he has not been able to control the bleeding.  He did not suffer any dental injury, nosebleed, or syncope.  Past Medical History:  Diagnosis Date   Anxiety    Aortic aneurysm ([place]) 09/16/2018   a. 08/2018 Asc Ao/root aneuryms w/ dissection-->s/p Bentall w/ 11mm Hemashield graft, 49mm SJM AVR, and reimplantation of cor arteries.   CAD (coronary artery disease)    a. 08/2018 Cor CTA: Ca2+ 99th%'ile (476). Calcified plaque in mLAD. Nl LM, LCX, RCA. S/p reimplantation of coronary arteries in setting of Bentall.   H/O mechanical aortic valve replacement    a. 08/2018 in setting of bicuspid AoV and Asc Ao dissection-->76mm SJM mech AVR; b. 03/2020 Echo: EF 55-60%. No rwma. Mild LVH. Nl RV fxn. Nl functioning AVR.   Hypertension    Pericardial effusion    a. 08/2018 post-op -->s/p window.    Patient Active Problem List   Diagnosis Date Noted   Warfarin anticoagulation 03/15/2019   Encounter for therapeutic drug monitoring 09/29/2018   Aortic valve prosthesis present 09/29/2018   S/P ascending aortic aneurysm repair 09/19/2018   Allergic rhinitis 09/06/2018   Hepatic steatosis 10/30/2016   Insomnia 11/29/2015   Migraine 11/29/2015    Past Surgical History:  Procedure Laterality Date   BENTALL PROCEDURE N/A 09/19/2018   Procedure: BENTALL PROCEDURE WITH [address].;  Surgeon: Gaye Pollack, MD;  Location: [place];  Service: Open  Heart Surgery;  Laterality: N/A;   CARDIAC SURGERY     CLEFT LIP REPAIR     SUBXYPHOID PERICARDIAL WINDOW N/A 09/26/2018   Procedure: SUBXYPHOID PERICARDIAL WINDOW;  Surgeon: Gaye Pollack, MD;  [facility];  Service: Thoracic;  Laterality: N/A;   TEE WITHOUT CARDIOVERSION N/A 09/19/2018   Procedure: TRANSESOPHAGEAL ECHOCARDIOGRAM (TEE);  Surgeon: Gaye Pollack, MD;  Location: [place];  Service: Open Heart Surgery;  Laterality: N/A;    Prior to Admission medications   Medication Sig Start Date End Date Taking? Authorizing Provider  atorvastatin (LIPITOR) 40 MG tablet Take 1 tablet (40 mg total) by mouth daily. 03/10/21 06/08/21  Kate Sable, MD  cetirizine (ZYRTEC) 10 MG tablet Take 10 mg by mouth daily as needed for allergies.    [provider]  clobetasol cream (TEMOVATE) 6.04 % Apply 1 application topically 2 (two) times daily. To external ear canals 09/03/20   Glean Hess, MD  metoprolol succinate (TOPROL-XL) 50 MG 24 hr tablet Take 1 1/2 tabs (75mg ) by mouth twice a day 03/10/21   Kate Sable, MD  warfarin (COUMADIN) 5 MG tablet Take 1.5 tablets by mouth daily or as directed by the Anticoagulation Clinic. 03/10/21   Kate Sable, MD    Allergies Patient has no known allergies.  Family History  Problem Relation Age of Onset   Heart attack Mother    Anxiety disorder Father    Diabetes Father    Depression Brother    Anxiety disorder Brother     Social  History Social History   Tobacco Use   Smoking status: Never   Smokeless tobacco: Never  Vaping Use   Vaping Use: Never used  Substance Use Topics   Alcohol use: No   Drug use: No    Review of Systems  Constitutional: Negative for fever. Eyes: Negative for visual changes. ENT: Negative for sore throat. Cardiovascular: Negative for chest pain. Respiratory: Negative for shortness of breath. Gastrointestinal: Negative for abdominal pain, vomiting and diarrhea. Genitourinary:  Negative for dysuria. Musculoskeletal: Negative for back pain. Skin: Negative for rash.  Lower lip abrasion as above Neurological: Negative for headaches, focal weakness or numbness. ____________________________________________  PHYSICAL EXAM:  VITAL SIGNS: ED Triage Vitals  Enc Vitals Group     BP 04/05/21 1724 (!) 112/91     Pulse Rate 04/05/21 1724 85     Resp 04/05/21 1724 20     Temp 04/05/21 1724 98.4 F (36.9 C)     Temp Source 04/05/21 1724 Oral     SpO2 04/05/21 1724 97 %     Weight 04/05/21 1724 190 lb (86.2 kg)     Height 04/05/21 1724 5\' 7"  (1.702 m)     Head Circumference --      Peak Flow --      Pain Score 04/05/21 1714 0     Pain Loc --      Pain Edu? --      Excl. in [place]? --     Constitutional: Alert and oriented. Well appearing and in no distress. Head: Normocephalic and atraumatic. Eyes: Conjunctivae are normal. PERRL. Normal extraocular movements Ears: Canals clear. TMs intact bilaterally. Nose: No congestion/rhinorrhea/epistaxis. Mouth/Throat: Mucous membranes are moist.  No dental injury appreciated.  Patient with a superficial abrasion to the lower lip with some active bleeding noted. Neck: Supple. No thyromegaly. Hematological/Lymphatic/Immunological: No cervical lymphadenopathy. Cardiovascular: Normal rate, regular rhythm. Normal distal pulses. Respiratory: Normal respiratory effort. No wheezes/rales/rhonchi. Gastrointestinal: Soft and nontender. No distention. Musculoskeletal: Nontender with normal range of motion in all extremities.  Neurologic:  Normal gait without ataxia. Normal speech and language. No gross focal neurologic deficits are appreciated. Skin:  Skin is warm, dry and intact. No rash noted. ____________________________________________    {LABS (pertinent positives/negatives)  ____________________________________________  {EKG  ____________________________________________   RADIOLOGY Official radiology report(s): No results  found. ____________________________________________  PROCEDURES  LET topical  Procedures ____________________________________________   INITIAL IMPRESSION / ASSESSMENT AND PLAN / ED COURSE  As part of my medical decision making, I reviewed the following data within the electronic MEDICAL RECORD NUMBER Notes from prior ED visits   Patient ED evaluation of a superficial lip abrasion presented to the ED due to inability to control bleeding.  Patient is on Coumadin therapy regularly.  He denies any other injury at this time.  Patient is treated in the ED with LET for hemostasis and is discharged stable condition after hemostasis is confirmed.  He will follow with primary provider return to the ED if needed.  Henry Duncan was evaluated in Emergency Department on 04/06/2021 for the symptoms described in the history of present illness. He was evaluated in the context of the global COVID-19 pandemic, which necessitated consideration that the patient might be at risk for infection with the SARS-CoV-2 virus that causes COVID-19. Institutional protocols and algorithms that pertain to the evaluation of patients at risk for COVID-19 are in a state of rapid change based on information released by regulatory bodies including the CDC and federal and state organizations. These  policies and algorithms were followed during the patient's care in the ED. ____________________________________________  FINAL CLINICAL IMPRESSION(S) / ED DIAGNOSES  Final diagnoses:  Lip laceration, initial encounter      Melvenia Needles, PA-C 04/06/21 0054    Carrie Mew, MD 04/06/21 2153

## 2021-04-05 NOTE — ED Triage Notes (Signed)
Pt was playing with his nephew when he hit his lip and it started bleeding- pt is on warfarin and cannot get it to stop bleeding

## 2021-04-05 NOTE — ED Provider Notes (Signed)
Emergency Medicine Provider Triage Evaluation Note  Henry Duncan , a 30 y.o. male  was evaluated in triage.  Pt complains of lip injury.  Presents with a small superficial abrasion to the lip, that continues to bleed due to his regular use of Coumadin.  She denies any dental injury, facial injury, or syncope.  Review of Systems  Positive: Lip abrasion Negative: syncope  Physical Exam  There were no vitals taken for this visit. Gen:   Awake, no distress  NAD Resp:  Normal effort CTA MSK:   Moves extremities without difficulty  Other:  ENT: superficial lower lip abrasion  Medical Decision Making  Medically screening exam initiated at 5:13 PM.  Appropriate orders placed.  [name] was informed that the remainder of the evaluation will be completed by another provider, this initial triage assessment does not replace that evaluation, and the importance of remaining in the ED until their evaluation is complete.  Patient with ED evaluation of a bleeding superficial lip abrasion, on warfarin. LET applied in triage.    Melvenia Needles, PA-C 04/05/21 1721    Vladimir Crofts, MD 04/05/21 Carollee Massed

## 2021-04-23 ENCOUNTER — Other Ambulatory Visit: Payer: Self-pay

## 2021-04-23 ENCOUNTER — Ambulatory Visit (INDEPENDENT_AMBULATORY_CARE_PROVIDER_SITE_OTHER): Payer: BC Managed Care – PPO

## 2021-04-23 DIAGNOSIS — Z952 Presence of prosthetic heart valve: Secondary | ICD-10-CM | POA: Diagnosis not present

## 2021-04-23 DIAGNOSIS — Z5181 Encounter for therapeutic drug level monitoring: Secondary | ICD-10-CM

## 2021-04-23 LAB — POCT INR: INR: 2.5 (ref 2.0–3.0)

## 2021-05-23 ENCOUNTER — Other Ambulatory Visit: Payer: Self-pay

## 2021-05-23 ENCOUNTER — Emergency Department
Admission: EM | Admit: 2021-05-23 | Discharge: 2021-05-23 | Disposition: A | Discharge status: Home / Self Care | Payer: BC Managed Care – PPO | Attending: Emergency Medicine | Admitting: Emergency Medicine

## 2021-05-23 ENCOUNTER — Emergency Department: Payer: BC Managed Care – PPO

## 2021-05-23 ENCOUNTER — Encounter: Payer: Self-pay | Admitting: Intensive Care

## 2021-05-23 ENCOUNTER — Telehealth: Payer: Self-pay | Admitting: Cardiology

## 2021-05-23 ENCOUNTER — Ambulatory Visit: Admit: 2021-05-23 | Discharge status: Against Medical Advice | Payer: BC Managed Care – PPO

## 2021-05-23 DIAGNOSIS — Z7901 Long term (current) use of anticoagulants: Secondary | ICD-10-CM | POA: Diagnosis not present

## 2021-05-23 DIAGNOSIS — R079 Chest pain, unspecified: Secondary | ICD-10-CM | POA: Diagnosis not present

## 2021-05-23 DIAGNOSIS — K449 Diaphragmatic hernia without obstruction or gangrene: Secondary | ICD-10-CM | POA: Diagnosis not present

## 2021-05-23 DIAGNOSIS — I2510 Atherosclerotic heart disease of native coronary artery without angina pectoris: Secondary | ICD-10-CM | POA: Diagnosis not present

## 2021-05-23 DIAGNOSIS — R0789 Other chest pain: Secondary | ICD-10-CM | POA: Insufficient documentation

## 2021-05-23 DIAGNOSIS — Z79899 Other long term (current) drug therapy: Secondary | ICD-10-CM | POA: Diagnosis not present

## 2021-05-23 DIAGNOSIS — I10 Essential (primary) hypertension: Secondary | ICD-10-CM | POA: Insufficient documentation

## 2021-05-23 LAB — CBC
HCT: 46.6 % (ref 39.0–52.0)
Hemoglobin: 16.3 g/dL (ref 13.0–17.0)
MCH: 32.4 pg (ref 26.0–34.0)
MCHC: 35 g/dL (ref 30.0–36.0)
MCV: 92.6 fL (ref 80.0–100.0)
Platelets: 215 10*3/uL (ref 150–400)
RBC: 5.03 MIL/uL (ref 4.22–5.81)
RDW: 13.1 % (ref 11.5–15.5)
WBC: 9.6 10*3/uL (ref 4.0–10.5)
nRBC: 0 % (ref 0.0–0.2)

## 2021-05-23 LAB — PROTIME-INR
INR: 2 — ABNORMAL HIGH (ref 0.8–1.2)
Prothrombin Time: 22.9 seconds — ABNORMAL HIGH (ref 11.4–15.2)

## 2021-05-23 LAB — BASIC METABOLIC PANEL
Anion gap: 7 (ref 5–15)
BUN: 13 mg/dL (ref 6–20)
CO2: 26 mmol/L (ref 22–32)
Calcium: 9 mg/dL (ref 8.9–10.3)
Chloride: 101 mmol/L (ref 98–111)
Creatinine, Ser: 0.57 mg/dL — ABNORMAL LOW (ref 0.61–1.24)
GFR, Estimated: >60 mL/min (ref >60)
Glucose, Bld: 113 mg/dL — ABNORMAL HIGH (ref 70–99)
Potassium: 3.8 mmol/L (ref 3.5–5.1)
Sodium: 134 mmol/L — ABNORMAL LOW (ref 135–145)

## 2021-05-23 LAB — TROPONIN I (HIGH SENSITIVITY)
Troponin I (High Sensitivity): 4 ng/L (ref <18)
Troponin I (High Sensitivity): 5 ng/L (ref <18)

## 2021-05-23 MED ORDER — IOHEXOL 350 MG/ML SOLN
75.0000 mL | Freq: Once | INTRAVENOUS | Status: AC | PRN
  Administered 2021-05-23: 16:00:00 75 mL via INTRAVENOUS
  Filled 2021-05-23: qty 75

## 2021-05-23 NOTE — Telephone Encounter (Signed)
Noted  

## 2021-05-23 NOTE — ED Provider Notes (Signed)
[facility] Emergency Department Provider Note  Time seen: 2:48 PM  I have reviewed the triage vital signs and the nursing notes.   HISTORY  Chief Complaint Chest Pain   HPI Henry Duncan is a 30 y.o. male with a past medical history of anxiety, aortic aneurysm status post mechanical aortic valve in 2020 on Coumadin who presents to the emergency department for chest pain.  According to the patient since early this morning he has been experiencing pain in the chest.  Describes as a mild to moderate pain in the center of his chest.  Patient states it concerns him because this was a similar pain when he was diagnosed with his thoracic aneurysm which led to valvular dysfunction and ultimately aortic valve replacement.  Patient denies any shortness of breath denies any nausea or diaphoresis.  No pleuritic pain.  Continues to take Coumadin as prescribed.   Past Medical History:  Diagnosis Date   Anxiety    Aortic aneurysm ([place]) 09/16/2018   a. 08/2018 Asc Ao/root aneuryms w/ dissection-->s/p Bentall w/ 78mm Hemashield graft, 35mm SJM AVR, and reimplantation of cor arteries.   CAD (coronary artery disease)    a. 08/2018 Cor CTA: Ca2+ 99th%'ile (476). Calcified plaque in mLAD. Nl LM, LCX, RCA. S/p reimplantation of coronary arteries in setting of Bentall.   H/O mechanical aortic valve replacement    a. 08/2018 in setting of bicuspid AoV and Asc Ao dissection-->38mm SJM mech AVR; b. 03/2020 Echo: EF 55-60%. No rwma. Mild LVH. Nl RV fxn. Nl functioning AVR.   Hypertension    Pericardial effusion    a. 08/2018 post-op -->s/p window.    Patient Active Problem List   Diagnosis Date Noted   Warfarin anticoagulation 03/15/2019   Encounter for therapeutic drug monitoring 09/29/2018   Aortic valve prosthesis present 09/29/2018   S/P ascending aortic aneurysm repair 09/19/2018   Allergic rhinitis 09/06/2018   Hepatic steatosis 10/30/2016   Insomnia 11/29/2015    Migraine 11/29/2015    Past Surgical History:  Procedure Laterality Date   BENTALL PROCEDURE N/A 09/19/2018   Procedure: BENTALL PROCEDURE WITH [address].;  Surgeon: Gaye Pollack, MD;  Location: [place];  Service: Open Heart Surgery;  Laterality: N/A;   CARDIAC SURGERY     CLEFT LIP REPAIR     SUBXYPHOID PERICARDIAL WINDOW N/A 09/26/2018   Procedure: SUBXYPHOID PERICARDIAL WINDOW;  Surgeon: Gaye Pollack, MD;  Location[facility];  Service: Thoracic;  Laterality: N/A;   TEE WITHOUT CARDIOVERSION N/A 09/19/2018   Procedure: TRANSESOPHAGEAL ECHOCARDIOGRAM (TEE);  Surgeon: Gaye Pollack, MD;  Location: [place];  Service: Open Heart Surgery;  Laterality: N/A;    Prior to Admission medications   Medication Sig Start Date End Date Taking? Authorizing Provider  atorvastatin (LIPITOR) 40 MG tablet Take 1 tablet (40 mg total) by mouth daily. 03/10/21 06/08/21  Kate Sable, MD  cetirizine (ZYRTEC) 10 MG tablet Take 10 mg by mouth daily as needed for allergies.    [provider]  clobetasol cream (TEMOVATE) 1.47 % Apply 1 application topically 2 (two) times daily. To external ear canals 09/03/20   Glean Hess, MD  metoprolol succinate (TOPROL-XL) 50 MG 24 hr tablet Take 1 1/2 tabs (75mg ) by mouth twice a day 03/10/21   Kate Sable, MD  warfarin (COUMADIN) 5 MG tablet Take 1.5 tablets by mouth daily or as directed by the Anticoagulation Clinic. 03/10/21   Kate Sable, MD    No  Known Allergies  Family History  Problem Relation Age of Onset   Heart attack Mother    Anxiety disorder Father    Diabetes Father    Depression Brother    Anxiety disorder Brother     Social History Social History   Tobacco Use   Smoking status: Never   Smokeless tobacco: Never  Vaping Use   Vaping Use: Former  Substance Use Topics   Alcohol use: Yes    Comment: rare   Drug use: No    Review of Systems Constitutional: Negative for fever. Cardiovascular: Mild  central chest pain. Respiratory: Negative for shortness of breath. Gastrointestinal: Negative for abdominal pain, vomiting  Musculoskeletal: Negative for musculoskeletal complaints Skin: Negative for skin complaints  Neurological: Negative for headache All other ROS negative  ____________________________________________   PHYSICAL EXAM:  VITAL SIGNS: ED Triage Vitals [05/23/21 1106]  Enc Vitals Group     BP (!) 130/91     Pulse Rate 91     Resp 16     Temp 98.5 F (36.9 C)     Temp Source Oral     SpO2 98 %     Weight 195 lb (88.5 kg)     Height 5\' 7"  (1.702 m)     Head Circumference      Peak Flow      Pain Score 1     Pain Loc      Pain Edu?      Excl. in [place]?    Constitutional: Alert and oriented. Well appearing and in no distress. Eyes: Normal exam ENT      Head: Normocephalic and atraumatic.      Mouth/Throat: Mucous membranes are moist. Cardiovascular: Normal rate, regular rhythm.  Respiratory: Normal respiratory effort without tachypnea nor retractions. Breath sounds are clear  Gastrointestinal: Soft and nontender. No distention. Musculoskeletal: Nontender with normal range of motion in all extremities.  Neurologic:  Normal speech and language. No gross focal neurologic deficits Skin:  Skin is warm, dry and intact.  Psychiatric: Mood and affect are normal.  ____________________________________________    EKG  EKG viewed and interpreted by myself shows a normal sinus rhythm at 94 bpm with a narrow QRS, normal axis, normal intervals, no concerning ST changes.  ____________________________________________    RADIOLOGY  Chest x-ray negative for acute abnormality.  ____________________________________________   INITIAL IMPRESSION / ASSESSMENT AND PLAN / ED COURSE  Pertinent labs & imaging results that were available during my care of the patient were reviewed by me and considered in my medical decision making (see chart for details).   Patient  presents emergency department for chest pain.  Patient states the pain is mild however it is similar to the pain he experienced when he was initially diagnosed with a thoracic aortic aneurysm leading to aortic valvular dysfunction ultimately requiring a valve replacement in 2020.  Patient is on Coumadin.  INR 2.0 slightly lower than his therapeutic window of 2.5-3 per patient.  Given the patient's complaint of pain in his history we will obtain a CT of the chest as a precaution.  The remainder the lab work is largely nonrevealing including a negative troponin x2.  As long as CTA of the chest shows no significant abnormality I believe the patient would be safe for discharge home.  Patient agreeable to plan of care.  Henry Duncan was evaluated in Emergency Department on 05/23/2021 for the symptoms described in the history of present illness. He was evaluated in the context  of the global COVID-19 pandemic, which necessitated consideration that the patient might be at risk for infection with the SARS-CoV-2 virus that causes COVID-19. Institutional protocols and algorithms that pertain to the evaluation of patients at risk for COVID-19 are in a state of rapid change based on information released by regulatory bodies including the CDC and federal and state organizations. These policies and algorithms were followed during the patient's care in the ED.  ____________________________________________   FINAL CLINICAL IMPRESSION(S) / ED DIAGNOSES  Chest pain   Harvest Dark, MD 05/23/21 1501

## 2021-05-23 NOTE — ED Notes (Signed)
ED Provider at bedside. 

## 2021-05-23 NOTE — ED Provider Notes (Signed)
Emergency department handoff note  Care of this patient was signed out to me at the end of the previous provider shift.  All pertinent patient information was conveyed and all questions were answered.  Patient pending CT angiography of the chest given history of a sending aortic aneurysm repair.  CT shows no signs of acute abnormalities with intact repair of his thoracic aortic aneurysm.  The patient has been reexamined and is ready to be discharged.  All diagnostic results have been reviewed and discussed with the patient/family.  Care plan has been outlined and the patient/family understands all current diagnoses, results, and treatment plans.  There are no new complaints, changes, or physical findings at this time.  All questions have been addressed and answered.  Patient was instructed to, and agrees to follow-up with their primary care physician cardiologist as well as return to the emergency department if any new or worsening symptoms develop.   Naaman Plummer, MD 05/23/21 [id number]

## 2021-05-23 NOTE — Telephone Encounter (Signed)
Patient having continuous chest pain on the left side of previous heart surgery. No relief in his pain and they are concerned. Advised that he should proceed to the nearest emergency room for further evaluation. She verbalized understanding with no further questions.

## 2021-05-23 NOTE — Telephone Encounter (Signed)
Pt c/o of Chest Pain: STAT if CP now or developed within 24 hours  1. Are you having CP right now? yes  2. Are you experiencing any other symptoms (ex. SOB, nausea, vomiting, sweating)? no  3. How long have you been experiencing CP? Since about 6 am today  4. Is your CP continuous or coming and going? Continuous   5. Have you taken Nitroglycerin? no ?

## 2021-05-23 NOTE — ED Triage Notes (Signed)
Patient c/o left side chest pain that started around 6am today. Constant pressure/ intermittent sharpness in chest

## 2021-05-27 ENCOUNTER — Encounter: Payer: Self-pay | Admitting: Internal Medicine

## 2021-06-02 ENCOUNTER — Encounter: Payer: Self-pay | Admitting: Cardiology

## 2021-06-02 ENCOUNTER — Ambulatory Visit (INDEPENDENT_AMBULATORY_CARE_PROVIDER_SITE_OTHER): Payer: BC Managed Care – PPO | Admitting: Cardiology

## 2021-06-02 ENCOUNTER — Other Ambulatory Visit: Payer: Self-pay

## 2021-06-02 VITALS — BP 130/72 | HR 74 | Ht 67.0 in | Wt 197.0 lb

## 2021-06-02 DIAGNOSIS — I2510 Atherosclerotic heart disease of native coronary artery without angina pectoris: Secondary | ICD-10-CM

## 2021-06-02 DIAGNOSIS — E7800 Pure hypercholesterolemia, unspecified: Secondary | ICD-10-CM

## 2021-06-02 DIAGNOSIS — I712 Thoracic aortic aneurysm, without rupture, unspecified: Secondary | ICD-10-CM

## 2021-06-02 DIAGNOSIS — R072 Precordial pain: Secondary | ICD-10-CM

## 2021-06-02 DIAGNOSIS — Z952 Presence of prosthetic heart valve: Secondary | ICD-10-CM

## 2021-06-02 MED ORDER — ATORVASTATIN CALCIUM 80 MG PO TABS: 80.0000 mg | ORAL_TABLET | Freq: Every day | ORAL | 5 refills | Status: DC

## 2021-06-02 MED ORDER — METOPROLOL TARTRATE 50 MG PO TABS: 50.0000 mg | ORAL_TABLET | Freq: Once | ORAL | 0 refills | Status: DC

## 2021-06-02 MED ORDER — IVABRADINE HCL 7.5 MG PO TABS: 15.0000 mg | ORAL_TABLET | Freq: Once | ORAL | 0 refills | Status: AC

## 2021-06-02 NOTE — Progress Notes (Signed)
Cardiology Office Note:    Date:  06/02/2021   ID:  Henry Duncan, DOB 1990-08-30, MRN [id number]  PCP:  Glean Hess, MD  [facility] Cardiologist:  Kate Sable, MD  [facility] [place] Electrophysiologist:  None   Referring MD: Glean Hess, MD   Chief Complaint  Patient presents with   Other    ED follow up -- Patient c.o chest pressure. Meds reviewed verbally with patient.     History of Present Illness:    Henry Duncan is a 31 y.o. male with a hx of hypertension, CAD (Ca2+ 15 mod mLAD dz Cor CTA 2020), aortic aneurysm, bicuspid aortic valve, ascending thoracic aorta dissection s/p Bentall procedure (with 23 mm [employer] Jude's mechanical valve, developed pericardial effusion postoperatively and underwent pericardial window 08/2018) who presents for hospital follow-up.  He was seen in the ED 05/23/2021, about 2 weeks ago due to symptoms of chest pain.  Symptoms of chest pain occurred while patient was running errands with wife.  Due to his cardiac history, he wanted to get checked out.  EKG did not show acute ischemic changes, troponins were normal.  He states doing okay otherwise.  Prior notes Patient presented to the hospital on 09/13/2018 due to chest pain.  CT did reveal ascending thoracic aorta aneurysm measuring 5.7 to 5.8 cm, localized partial dissection of the anterior middle ascending thoracic aorta.  He was subsequently taken for surgery and underwent Bentall procedure.  Last transthoracic echocardiogram 09/16/2018 showed normal systolic and diastolic function, EF 60 to 65%.  Intraoperative TEE on 09/19/2018 showed normal bioprosthetic valve with normal washing jets, transaortic gradient 7 mmHg.  Coronary CTA 2020, calcium score 476, moderate mid LAD disease   Past Medical History:  Diagnosis Date   Anxiety    Aortic aneurysm ([place]) 09/16/2018   a. 08/2018 Asc Ao/root aneuryms w/ dissection-->s/p Bentall w/ 54mm Hemashield graft, 59mm SJM AVR, and  reimplantation of cor arteries.   CAD (coronary artery disease)    a. 08/2018 Cor CTA: Ca2+ 99th%'ile (476). Calcified plaque in mLAD. Nl LM, LCX, RCA. S/p reimplantation of coronary arteries in setting of Bentall.   H/O mechanical aortic valve replacement    a. 08/2018 in setting of bicuspid AoV and Asc Ao dissection-->44mm SJM mech AVR; b. 03/2020 Echo: EF 55-60%. No rwma. Mild LVH. Nl RV fxn. Nl functioning AVR.   Hypertension    Pericardial effusion    a. 08/2018 post-op -->s/p window.    Past Surgical History:  Procedure Laterality Date   BENTALL PROCEDURE N/A 09/19/2018   Procedure: BENTALL PROCEDURE WITH [address]  Surgeon: Gaye Pollack, MD;  Location: [place];  Service: Open Heart Surgery;  Laterality: N/A;   CARDIAC SURGERY     CLEFT LIP REPAIR     SUBXYPHOID PERICARDIAL WINDOW N/A 09/26/2018   Procedure: SUBXYPHOID PERICARDIAL WINDOW;  Surgeon: Gaye Pollack, MD;  Location: [facility];  Service: Thoracic;  Laterality: N/A;   TEE WITHOUT CARDIOVERSION N/A 09/19/2018   Procedure: TRANSESOPHAGEAL ECHOCARDIOGRAM (TEE);  Surgeon: Gaye Pollack, MD;  Location: [place];  Service: Open Heart Surgery;  Laterality: N/A;    Current Medications: Current Meds  Medication Sig   cetirizine (ZYRTEC) 10 MG tablet Take 10 mg by mouth daily as needed for allergies.   clobetasol cream (TEMOVATE) 2.29 % Apply 1 application topically 2 (two) times daily. To external ear canals   ivabradine (CORLANOR) 7.5 MG TABS tablet Take 2 tablets (15 mg total)  by mouth once for 1 dose. Take 2 hours prior to your CT scan.   metoprolol succinate (TOPROL-XL) 50 MG 24 hr tablet Take 1 1/2 tabs (75mg ) by mouth twice a day   metoprolol tartrate (LOPRESSOR) 50 MG tablet Take 1 tablet (50 mg total) by mouth once for 1 dose. Take 2 hours prior to your CT scan.   warfarin (COUMADIN) 5 MG tablet Take 1.5 tablets by mouth daily or as directed by the Anticoagulation Clinic.   [DISCONTINUED] atorvastatin (LIPITOR)  40 MG tablet Take 1 tablet (40 mg total) by mouth daily.     Allergies:   Patient has no known allergies.   Social History   Socioeconomic History   Marital status: Single    Spouse name: Not on file   Number of children: 0   Years of education: Not on file   Highest education level: Not on file  Occupational History   Not on file  Tobacco Use   Smoking status: Never   Smokeless tobacco: Never  Vaping Use   Vaping Use: Former  Substance and Sexual Activity   Alcohol use: Yes    Comment: rare   Drug use: No   Sexual activity: Yes    Birth control/protection: Condom  Other Topics Concern   Not on file  Social History Narrative   Not on file   Social Determinants of Health   Financial Resource Strain: Not on file  Food Insecurity: Not on file  Transportation Needs: Not on file  Physical Activity: Not on file  Stress: Not on file  Social Connections: Not on file     Family History: The patient's family history includes Anxiety disorder in his brother and father; Depression in his brother; Diabetes in his father; Heart attack in his mother.  ROS:   Please see the history of present illness.     All other systems reviewed and are negative.  EKGs/Labs/Other Studies Reviewed:    The following studies were reviewed today:   EKG:  EKG is  ordered today.  The ekg ordered today demonstrates normal sinus rhythm, normal EKG  Recent Labs: 09/03/2020: ALT 47; TSH 0.559 05/23/2021: BUN 13; Creatinine, Ser 0.57; Hemoglobin 16.3; Platelets 215; Potassium 3.8; Sodium 134  Recent Lipid Panel    Component Value Date/Time   CHOL 170 09/03/2020 0829   TRIG 146 09/03/2020 0829   HDL 33 (L) 09/03/2020 0829   CHOLHDL 5.2 (H) 09/03/2020 0829   LDLCALC 111 (H) 09/03/2020 0829     Risk Assessment/Calculations:      Physical Exam:    VS:  BP 130/72 (BP Location: Left Arm, Patient Position: Sitting, Cuff Size: Normal)    Pulse 74    Ht 5\' 7"  (1.702 m)    Wt 197 lb (89.4 kg)     SpO2 98%    BMI 30.85 kg/m     Wt Readings from Last 3 Encounters:  06/02/21 197 lb (89.4 kg)  05/23/21 195 lb (88.5 kg)  04/05/21 190 lb (86.2 kg)     GEN:  Well nourished, well developed in no acute distress HEENT: Normal NECK: No JVD; No carotid bruits LYMPHATICS: No lymphadenopathy CARDIAC: RRR, 2/6 systolic murmur, mechanical click consistent with mechanical valve noted. RESPIRATORY:  Clear to auscultation without rales, wheezing or rhonchi  ABDOMEN: Soft, non-tender, non-distended MUSCULOSKELETAL:  No edema; No deformity  SKIN: Warm and dry NEUROLOGIC:  Alert and oriented x 3 PSYCHIATRIC:  Normal affect   ASSESSMENT:    1. Precordial  pain   2. Thoracic aortic aneurysm without rupture, unspecified part   3. H/O mechanical aortic valve replacement   4. Pure hypercholesterolemia   5. Coronary artery disease involving native coronary artery of native heart, unspecified whether angina present     PLAN:    In order of problems listed above:  Chest pain, history of CAD.  Get coronary CTA to evaluate progression of CAD. Patient with history of aortic aneurysm, dissection, status post Bentall procedure with aortic root replacement and mechanical aortic valve. CT angio aorta obtained 04/2021 showed normal aortic graft with no postop complications.  Continue monitoring with yearly CT scan for aortic root size and or any postop complications. Continue Lopressor 75 mg twice daily. History of mechanical aortic valve replacement, keep appointments with Coumadin clinic for frequent INR checks.  Previous gradient was 7.5 mmHg a year ago.  Echo 03/2020 showed normal functioning prosthetic valve. History of hyperlipidemia, last lipid panel of normal.  Increase Lipitor to 80 mg daily. CAD, previous CCTA with moderate LAD disease.  LDL not at goal.  Increase Lipitor to 80 mg, patient on warfarin.,  Coronary CTA as above.  Follow-up in 1 month  Total encounter time 40 minutes  Greater  than 50% was spent in counseling and coordination of care with the patient   Medication Adjustments/Labs and Tests Ordered: Current medicines are reviewed at length with the patient today.  Concerns regarding medicines are outlined above.  Orders Placed This Encounter  Procedures   CT CORONARY MORPH W/CTA COR W/SCORE W/CA W/CM &/OR WO/CM   EKG 12-Lead   Meds ordered this encounter  Medications   metoprolol tartrate (LOPRESSOR) 50 MG tablet    Sig: Take 1 tablet (50 mg total) by mouth once for 1 dose. Take 2 hours prior to your CT scan.    Dispense:  1 tablet    Refill:  0   ivabradine (CORLANOR) 7.5 MG TABS tablet    Sig: Take 2 tablets (15 mg total) by mouth once for 1 dose. Take 2 hours prior to your CT scan.    Dispense:  2 tablet    Refill:  0   atorvastatin (LIPITOR) 80 MG tablet    Sig: Take 1 tablet (80 mg total) by mouth daily.    Dispense:  30 tablet    Refill:  5    Patient Instructions  Medication Instructions:   Your physician has recommended you make the following change in your medication:   1.  INCREASE your Atorvastatin (Lipitor) to 80 MG once a day.  *If you need a refill on your cardiac medications before your next appointment, please call your pharmacy*   Lab Work:  None ordered If you have labs (blood work) drawn today and your tests are completely normal, you will receive your results only by: [place] (if you have MyChart) OR A paper copy in the mail If you have any lab test that is abnormal or we need to change your treatment, we will call you to review the results.   Testing/Procedures:  Your cardiac CT will be scheduled at:  [facility] [address] [place], [place] [address] [phone]  Monday, Jan. 16th at 11:00  Please arrive 15 mins early for check-in and test prep.    Please follow these instructions carefully (unless otherwise directed):    On the Night Before the  Test: Be sure to Drink plenty of water. Do not consume any caffeinated/decaffeinated beverages or  chocolate 12 hours prior to your test.   On the Day of the Test: Drink plenty of water until 1 hour prior to the test. Do not eat any food 4 hours prior to the test. You may take your regular medications prior to the test.  Take metoprolol (Lopressor) 50 MG two hours prior to test. Take Ivabradine (Corlanor) 15 MG two hours prior to test.        After the Test: Drink plenty of water. After receiving IV contrast, you may experience a mild flushed feeling. This is normal. On occasion, you may experience a mild rash up to 24 hours after the test. This is not dangerous. If this occurs, you can take Benadryl 25 mg and increase your fluid intake. If you experience trouble breathing, this can be serious. If it is severe call 911 IMMEDIATELY. If it is mild, please call our office. If you take any of these medications: Glipizide/Metformin, Avandament, Glucavance, please do not take 48 hours after completing test unless otherwise instructed.  Please allow 2-4 weeks for scheduling of routine cardiac CTs. Some insurance companies require a pre-authorization which may delay scheduling of this test.   For non-scheduling related questions, please contact the cardiac imaging nurse navigator should you have any questions/concerns: Marchia Bond, Cardiac Imaging Nurse Navigator Gordy Clement, Cardiac Imaging Nurse Navigator [facility] Direct Office Dial: [phone]   For scheduling needs, including cancellations and rescheduling, please call [place], [phone].     Follow-Up: At [facility], you and your health needs are our priority.  As part of our continuing mission to provide you with exceptional heart care, we have created designated Provider Care Teams.  These Care Teams include your primary Cardiologist (physician) and Advanced Practice Providers (APPs -   Physician Assistants and Nurse Practitioners) who all work together to provide you with the care you need, when you need it.  We recommend signing up for the patient portal called "MyChart".  Sign up information is provided on this After Visit Summary.  MyChart is used to connect with patients for Virtual Visits (Telemedicine).  Patients are able to view lab/test results, encounter notes, upcoming appointments, etc.  Non-urgent messages can be sent to your provider as well.   To learn more about what you can do with MyChart, go to [email or web address].    Your next appointment:   1 month(s)  The format for your next appointment:   In Person  Provider:   You may see Kate Sable, MD or one of the following Advanced Practice Providers on your designated Care Team:   Murray Hodgkins, NP Christell Faith, PA-C Cadence Kathlen Mody, [place]    Other Instructions     Signed, Kate Sable, MD  06/02/2021 12:36 PM    [place]

## 2021-06-02 NOTE — Patient Instructions (Signed)
Medication Instructions:   Your physician has recommended you make the following change in your medication:   1.  INCREASE your Atorvastatin (Lipitor) to 80 MG once a day.  *If you need a refill on your cardiac medications before your next appointment, please call your pharmacy*   Lab Work:  None ordered If you have labs (blood work) drawn today and your tests are completely normal, you will receive your results only by: [place] (if you have MyChart) OR A paper copy in the mail If you have any lab test that is abnormal or we need to change your treatment, we will call you to review the results.   Testing/Procedures:  Your cardiac CT will be scheduled at:  [facility] [address] [place],  [address] [phone]  Monday, Jan. 16th at 11:00  Please arrive 15 mins early for check-in and test prep.    Please follow these instructions carefully (unless otherwise directed):    On the Night Before the Test: Be sure to Drink plenty of water. Do not consume any caffeinated/decaffeinated beverages or chocolate 12 hours prior to your test.   On the Day of the Test: Drink plenty of water until 1 hour prior to the test. Do not eat any food 4 hours prior to the test. You may take your regular medications prior to the test.  Take metoprolol (Lopressor) 50 MG two hours prior to test. Take Ivabradine (Corlanor) 15 MG two hours prior to test.        After the Test: Drink plenty of water. After receiving IV contrast, you may experience a mild flushed feeling. This is normal. On occasion, you may experience a mild rash up to 24 hours after the test. This is not dangerous. If this occurs, you can take Benadryl 25 mg and increase your fluid intake. If you experience trouble breathing, this can be serious. If it is severe call 911 IMMEDIATELY. If it is mild, please call our office. If you take any of these medications:  Glipizide/Metformin, Avandament, Glucavance, please do not take 48 hours after completing test unless otherwise instructed.  Please allow 2-4 weeks for scheduling of routine cardiac CTs. Some insurance companies require a pre-authorization which may delay scheduling of this test.   For non-scheduling related questions, please contact the cardiac imaging nurse navigator should you have any questions/concerns: Marchia Bond, Cardiac Imaging Nurse Navigator Gordy Clement, Cardiac Imaging Nurse Navigator [place] [facility] Direct Office Dial: [phone]   For scheduling needs, including cancellations and rescheduling, please call [place], [phone].     Follow-Up: At [facility], you and your health needs are our priority.  As part of our continuing mission to provide you with exceptional heart care, we have created designated Provider Care Teams.  These Care Teams include your primary Cardiologist (physician) and Advanced Practice Providers (APPs -  Physician Assistants and Nurse Practitioners) who all work together to provide you with the care you need, when you need it.  We recommend signing up for the patient portal called "MyChart".  Sign up information is provided on this After Visit Summary.  MyChart is used to connect with patients for Virtual Visits (Telemedicine).  Patients are able to view lab/test results, encounter notes, upcoming appointments, etc.  Non-urgent messages can be sent to your provider as well.   To learn more about what you can do with MyChart, go to [email or web address].    Your next appointment:   1  month(s)  The format for your next appointment:   In Person  Provider:   You may see Kate Sable, MD or one of the following Advanced Practice Providers on your designated Care Team:   Murray Hodgkins, NP Christell Faith, PA-C Cadence Kathlen Mody, [place]    Other Instructions

## 2021-06-04 ENCOUNTER — Ambulatory Visit (INDEPENDENT_AMBULATORY_CARE_PROVIDER_SITE_OTHER): Payer: BC Managed Care – PPO

## 2021-06-04 ENCOUNTER — Other Ambulatory Visit: Payer: Self-pay

## 2021-06-04 DIAGNOSIS — Z952 Presence of prosthetic heart valve: Secondary | ICD-10-CM | POA: Diagnosis not present

## 2021-06-04 DIAGNOSIS — Z5181 Encounter for therapeutic drug level monitoring: Secondary | ICD-10-CM | POA: Diagnosis not present

## 2021-06-04 LAB — POCT INR: INR: 2.8 (ref 2.0–3.0)

## 2021-06-04 NOTE — Patient Instructions (Signed)
-   continue of warfarin of 1.5 tablets warfarin every day - recheck INR in 6 weeks

## 2021-06-05 ENCOUNTER — Ambulatory Visit: Payer: BC Managed Care – PPO | Admitting: Cardiology

## 2021-06-06 ENCOUNTER — Telehealth (HOSPITAL_COMMUNITY): Payer: Self-pay | Admitting: Emergency Medicine

## 2021-06-06 NOTE — Telephone Encounter (Signed)
Attempted to call patient regarding upcoming cardiac CT appointment. °Left message on voicemail with name and callback number °Frazier Balfour RN Navigator Cardiac Imaging °[facility] °[phone] Office °[phone] Cell ° °

## 2021-06-09 ENCOUNTER — Other Ambulatory Visit: Payer: Self-pay

## 2021-06-09 ENCOUNTER — Encounter: Payer: Self-pay | Admitting: Cardiology

## 2021-06-09 ENCOUNTER — Other Ambulatory Visit: Payer: Self-pay | Admitting: Cardiology

## 2021-06-09 ENCOUNTER — Ambulatory Visit
Admission: RE | Admit: 2021-06-09 | Discharge: 2021-06-09 | Disposition: A | Discharge status: Home / Self Care | Payer: BC Managed Care – PPO | Source: Ambulatory Visit | Attending: Cardiology | Admitting: Cardiology

## 2021-06-09 DIAGNOSIS — R931 Abnormal findings on diagnostic imaging of heart and coronary circulation: Secondary | ICD-10-CM | POA: Insufficient documentation

## 2021-06-09 DIAGNOSIS — R072 Precordial pain: Secondary | ICD-10-CM | POA: Diagnosis not present

## 2021-06-09 DIAGNOSIS — I2510 Atherosclerotic heart disease of native coronary artery without angina pectoris: Secondary | ICD-10-CM

## 2021-06-09 MED ORDER — IOHEXOL 350 MG/ML SOLN
100.0000 mL | Freq: Once | INTRAVENOUS | Status: AC | PRN
  Administered 2021-06-09: 100 mL via INTRAVENOUS

## 2021-06-09 MED ORDER — NITROGLYCERIN 0.4 MG SL SUBL
0.8000 mg | SUBLINGUAL_TABLET | Freq: Once | SUBLINGUAL | Status: AC
  Administered 2021-06-09: 0.8 mg via SUBLINGUAL

## 2021-06-09 NOTE — Progress Notes (Signed)
Patient tolerated CT well. Drank water after. Vital signs stable encourage to drink water throughout day.Reasons explained and verbalized understanding. Ambulated steady gait.  

## 2021-06-10 ENCOUNTER — Telehealth: Payer: Self-pay

## 2021-06-10 DIAGNOSIS — R931 Abnormal findings on diagnostic imaging of heart and coronary circulation: Secondary | ICD-10-CM

## 2021-06-10 DIAGNOSIS — I2510 Atherosclerotic heart disease of native coronary artery without angina pectoris: Secondary | ICD-10-CM | POA: Diagnosis not present

## 2021-06-10 NOTE — Telephone Encounter (Signed)
Called patient and reviewed the following result note:  coronary CTA with significant mid LAD stenosis, heavily calcified vessel, calcium score 958.  Continue medications as prescribed.  Keep follow-up appointment, will consider left heart cath.  Patient has been scheduled to see Dr. Garen Lah tomorrow 06/11/21.

## 2021-06-11 ENCOUNTER — Encounter: Payer: Self-pay | Admitting: Cardiology

## 2021-06-11 ENCOUNTER — Encounter: Payer: Self-pay | Admitting: *Deleted

## 2021-06-11 ENCOUNTER — Ambulatory Visit (INDEPENDENT_AMBULATORY_CARE_PROVIDER_SITE_OTHER): Payer: BC Managed Care – PPO | Admitting: Cardiology

## 2021-06-11 ENCOUNTER — Other Ambulatory Visit: Payer: Self-pay

## 2021-06-11 ENCOUNTER — Ambulatory Visit (INDEPENDENT_AMBULATORY_CARE_PROVIDER_SITE_OTHER): Payer: BC Managed Care – PPO

## 2021-06-11 ENCOUNTER — Telehealth: Payer: Self-pay | Admitting: Pharmacist

## 2021-06-11 VITALS — BP 110/70 | HR 85 | Ht 67.0 in | Wt 197.0 lb

## 2021-06-11 DIAGNOSIS — I2510 Atherosclerotic heart disease of native coronary artery without angina pectoris: Secondary | ICD-10-CM

## 2021-06-11 DIAGNOSIS — Z952 Presence of prosthetic heart valve: Secondary | ICD-10-CM

## 2021-06-11 DIAGNOSIS — E7800 Pure hypercholesterolemia, unspecified: Secondary | ICD-10-CM | POA: Diagnosis not present

## 2021-06-11 DIAGNOSIS — I712 Thoracic aortic aneurysm, without rupture, unspecified: Secondary | ICD-10-CM | POA: Diagnosis not present

## 2021-06-11 DIAGNOSIS — Z5181 Encounter for therapeutic drug level monitoring: Secondary | ICD-10-CM | POA: Diagnosis not present

## 2021-06-11 DIAGNOSIS — Z01812 Encounter for preprocedural laboratory examination: Secondary | ICD-10-CM | POA: Diagnosis not present

## 2021-06-11 LAB — POCT INR: INR: 1.6 — AB (ref 2.0–3.0)

## 2021-06-11 MED ORDER — ISOSORBIDE MONONITRATE ER 30 MG PO TB24: 15.0000 mg | ORAL_TABLET | Freq: Every day | ORAL | 1 refills | Status: DC

## 2021-06-11 MED ORDER — ENOXAPARIN SODIUM 80 MG/0.8ML IJ SOSY: 80.0000 mg | PREFILLED_SYRINGE | Freq: Two times a day (BID) | INTRAMUSCULAR | 0 refills | Status: DC

## 2021-06-11 NOTE — Patient Instructions (Signed)
06/11/20: Inject enoxaparin in the fatty tissue  tonight at 8pm. No warfarin.   06/12/20: Inject enoxaparin in the fatty tissue in the morning at 8 am (No PM dose). No warfarin.   06/13/20: Procedure Day - No enoxaparin - Resume warfarin in the evening or as directed by doctor (take an extra half tablet with usual dose for 2 days then resume normal dose).   06/14/20: Resume enoxaparin inject in the fatty tissue every 12 hours and take warfarin w/ extra 1/2 dose   06/15/20: Inject enoxaparin in the fatty tissue every 12 hours and take warfarin   06/16/20: Inject enoxaparin in the fatty tissue every 12 hours and take warfarin   06/17/20: Inject enoxaparin in the fatty tissue every 12 hours and take warfarin    06/18/20:  Inject enoxaparin in the fatty tissue in am; warfarin appt to check INR.

## 2021-06-11 NOTE — Patient Instructions (Addendum)
Medication Instructions:  - Your physician has recommended you make the following change in your medication:   1) START Imdur (Isosorbide MN) 30 mg: - take 0.5 tablet (15 mg) by mouth once daily   *If you need a refill on your cardiac medications before your next appointment, please call your pharmacy*   Lab Work: - Your physician recommends that you have lab work today: BMP/ CBC    If you have labs (blood work) drawn today and your tests are completely normal, you will receive your results only by: [facility] (if you have [facility]) OR A paper copy in the mail If you have any lab test that is abnormal or we need to change your treatment, we will call you to review the results.   Testing/Procedures:  1) Cardiac Catherization:  - Your physician has requested that you have a cardiac catheterization. Cardiac catheterization is used to diagnose and/or treat various heart conditions. Doctors may recommend this procedure for a number of different reasons. The most common reason is to evaluate chest pain. Chest pain can be a symptom of coronary artery disease (CAD), and cardiac catheterization can show whether plaque is narrowing or blocking your hearts arteries. This procedure is also used to evaluate the valves, as well as measure the blood flow and oxygen levels in different parts of your heart.    You are scheduled for a Cardiac Catheterization on Friday, January 20 with Dr. Kathlyn [place].  1. Please arrive at the [facility] at 12:30 PM (This time is one hour before your procedure to ensure your preparation). Free valet parking service is available.   - Once you enter the [facility], proceed directly to the 1st desk on the right to check in (Registration). - You will be directed where to go from there.  Special note: Every effort is made to have your procedure done on time. Please understand that emergencies sometimes delay scheduled procedures.  2. Diet: Do  not eat solid foods after midnight.  You may have clear liquids until 5am upon the day of the procedure.  3. Labs: as above  4. Medication instructions in preparation for your procedure:   Contrast Allergy: No   Stop taking Coumadin (Warfarin) on Wednesday, January 18.   See Instructions for Lovenox Bridging   On the morning of your procedure, take your Aspirin 81 mg and any morning medicines NOT listed above.  You may use sips of water.  5. Plan for one night stay--bring personal belongings. 6. Bring a current list of your medications and current insurance cards. 7. You MUST have a responsible person to drive you home. 8. Someone MUST be with you the first 24 hours after you arrive home or your discharge will be delayed. 9. Please wear clothes that are easy to get on and off and wear slip-on shoes.  Thank you for allowing [place] to care for you!   -- [facility]    Follow-Up: At [facility], you and your health needs are our priority.  As part of our continuing mission to provide you with exceptional heart care, we have created designated Provider Care Teams.  These Care Teams include your primary Cardiologist (physician) and Advanced Practice Providers (APPs -  Physician Assistants and Nurse Practitioners) who all work together to provide you with the care you need, when you need it.  We recommend signing up for the patient portal called "MyChart".  Sign up information is provided on  this After Visit Summary.  MyChart is used to connect with patients for Virtual Visits (Telemedicine).  Patients are able to view lab/test results, encounter notes, upcoming appointments, etc.  Non-urgent messages can be sent to your provider as well.   To learn more about what you can do with MyChart, go to [email or web address].    Your next appointment:   1 month- as scheduled    The format for your next appointment:   In Person  Provider:   Kate Sable, MD    Other Instructions  Isosorbide Mononitrate Extended-Release Tablets What is this medication? ISOSORBIDE MONONITRATE (eye soe SOR bide mon oh NYE trate) prevents chest pain (angina). It works by relaxing blood vessels, which decreases the amount of work the heart has to do. It belongs to a group of medications called nitrates. Do not use it to treat sudden chest pain. This medicine may be used for other purposes; ask your health care provider or pharmacist if you have questions. COMMON BRAND NAME(S): Imdur, Isotrate ER What should I tell my care team before I take this medication? They need to know if you have any of these conditions: Previous heart attack or heart failure An unusual or allergic reaction to isosorbide mononitrate, nitrates, other medications, foods, dyes, or preservatives Pregnant or trying to get pregnant Breast-feeding How should I use this medication? Take this medication by mouth with a glass of water. Follow the directions on the prescription label. Do not crush or chew. Take your medication at regular intervals. Do not take your medication more often than directed. Do not stop taking this medication except on the advice of your care team. Talk to your care team about the use of this medication in children. Special care may be needed. Overdosage: If you think you have taken too much of this medicine contact a poison control center or emergency room at once. NOTE: This medicine is only for you. Do not share this medicine with others. What if I miss a dose? If you miss a dose, take it as soon as you can. If it is almost time for your next dose, take only that dose. Do not take double or extra doses. What may interact with this medication? Do not take this medication with any of the following: Medications used to treat erectile dysfunction (ED) like avanafil, sildenafil, tadalafil, and vardenafil [place] This medication may also interact with the  following: Medications for high blood pressure Other medications for angina or heart failure This list may not describe all possible interactions. Give your health care provider a list of all the medicines, herbs, non-prescription drugs, or dietary supplements you use. Also tell them if you smoke, drink alcohol, or use illegal drugs. Some items may interact with your medicine. What should I watch for while using this medication? Check your heart rate and blood pressure regularly while you are taking this medication. Ask your care team what your heart rate and blood pressure should be and when you should contact him or her. Tell your care team if you feel your medication is no longer working. You may get dizzy. Do not drive, use machinery, or do anything that needs mental alertness until you know how this medication affects you. To reduce the risk of dizzy or fainting spells, do not sit or stand up quickly, especially if you are an older patient. Alcohol can make you more dizzy, and increase flushing and rapid heartbeats. Avoid alcoholic drinks. Do not treat yourself for coughs, colds, or  pain while you are taking this medication without asking your care team for advice. Some ingredients may increase your blood pressure. What side effects may I notice from receiving this medication? Side effects that you should report to your care team as soon as possible: Allergic reactions--skin rash, itching, hives, swelling of the face, lips, tongue, or throat Headache, unusual weakness or fatigue, shortness of breath, nausea, vomiting, rapid heartbeat, blue skin or lips, which may be signs of methemoglobinemia Increased pressure around the brain--severe headache, blurry vision, change in vision, nausea, vomiting Low blood pressure--dizziness, feeling faint or lightheaded, blurry vision Slow heartbeat--dizziness, feeling faint or lightheaded, confusion, trouble breathing, unusual weakness or fatigue Worsening chest  pain (angina)--pain, pressure, or tightness in the chest, neck, back, or arms Side effects that usually do not require medical attention (report to your care team if they continue or are bothersome): Dizziness Flushing Headache This list may not describe all possible side effects. Call your doctor for medical advice about side effects. You may report side effects to FDA at 1-800-FDA-1088. Where should I keep my medication? Keep out of the reach of children. Store between 15 and 30 degrees C (59 and 86 degrees F). Keep container tightly closed. Throw away any unused medication after the expiration date. NOTE: This sheet is a summary. It may not cover all possible information. If you have questions about this medicine, talk to your doctor, pharmacist, or health care provider.  2022 Elsevier/Gold Standard (2020-09-25 00:00:00)    Coronary Angiogram A coronary angiogram is an X-ray procedure that is used to examine the arteries in the heart. Contrast dye is injected through a long, thin tube (catheter) into these arteries. Then X-rays are taken to show any blockage in these arteries. You may have this procedure if you: Are having chest pain, or other symptoms of angina, and you are at risk for heart disease. Have an abnormal stress test or test of your heart's electrical activity (electrocardiogram, or ECG). Have chest pain and heart failure. Are having irregular heart rhythms. A coronary angiogram or heart catheterization can show if you have valve disease or a disease of the aorta. This procedure can also be used to check the overall function of your heart muscle. Let your health care provider know about: Any allergies you have, including allergies to medicines or contrast dye. All medicines you are taking, including vitamins, herbs, eye drops, creams, and over-the-counter medicines. Any problems you or family members have had with anesthetic medicines. Any blood disorders you have. Any  surgeries you have had. Any history of kidney problems or kidney failure. Any medical conditions you have. Whether you are pregnant or may be pregnant. Whether you are breastfeeding. What are the risks? Generally, this is a safe procedure. However, problems may occur, including: Infection. Allergic reaction to medicines or dyes that are used. Bleeding from the insertion site or other places. Damage to nearby structures, such as blood vessels, or damage to kidneys from contrast dye. Irregular heart rhythms. Stroke (rare). Heart attack (rare). What happens before the procedure? Staying hydrated Follow instructions from your health care provider about hydration, which may include: Up to 2 hours before the procedure - you may continue to drink clear liquids, such as water, clear fruit juice, black coffee, and plain tea.  Eating and drinking restrictions Follow instructions from your health care provider about eating and drinking, which may include: 8 hours before the procedure - stop eating heavy meals or foods, such as meat, fried foods, or  fatty foods. 6 hours before the procedure - stop eating light meals or foods, such as toast or cereal. 6 hours before the procedure - stop drinking milk or drinks that contain milk. 2 hours before the procedure - stop drinking clear liquids. Medicines Ask your health care provider about: Changing or stopping your regular medicines. This is especially important if you are taking diabetes medicines or blood thinners. Taking medicines such as aspirin and ibuprofen. These medicines can thin your blood. Do not take these medicines unless your health care provider tells you to take them. Aspirin may be recommended before coronary angiograms even if you do not normally take it. Taking over-the-counter medicines, vitamins, herbs, and supplements. General instructions Do not use any products that contain nicotine or tobacco for at least 4 weeks before the  procedure. These products include cigarettes, e-cigarettes, and chewing tobacco. If you need help quitting, ask your health care provider. You may have an exam or testing. Plan to have someone take you home from the hospital or clinic. If you will be going home right after the procedure, plan to have someone with you for 24 hours. Ask your health care provider: How your insertion site will be marked. What steps will be taken to help prevent infection. These may include: Removing hair at the insertion site. Washing skin with a germ-killing soap. Taking antibiotic medicine. What happens during the procedure?  You will lie on your back on an X-ray table. An IV will be inserted into one of your veins. Electrodes will be placed on your chest. You will be given one or more of the following: A medicine to help you relax (sedative). A medicine to numb the catheter insertion area (local anesthetic). You will be connected to a continuous ECG monitor. The catheter will be inserted into an artery in one of these areas: Your groin area in your upper thigh. Your wrist. The fold of your arm, near your elbow. An X-ray procedure (fluoroscopy) will be used to help guide the catheter to the opening of the blood vessel to be used. A dye will be injected into the catheter and X-rays will be taken. The dye will help to show any narrowing or blockages in the heart arteries. Tell your health care provider if you have chest pain or trouble breathing. If blockages are found, another procedure may be done to open the artery. The catheter will be removed after the fluoroscopy is complete. A bandage (dressing) will be placed over the insertion site. Pressure will be applied to stop bleeding. The IV will be removed. The procedure may vary among health care providers and hospitals. What happens after the procedure? Your blood pressure, heart rate, breathing rate, and blood oxygen level will be monitored until you  leave the hospital or clinic. You will need to lie still for a few hours, or for as long as told by your health care provider. If the procedure is done through the groin, you will be told not to bend or cross your legs. The insertion site and the pulse in your foot or wrist will be checked often. More blood tests, X-rays, and an ECG may be done. Do not drive for 24 hours if you were given a sedative during your procedure. Summary A coronary angiogram is an X-ray procedure that is used to examine the arteries in the heart. Contrast dye is injected through a long, thin tube (catheter) into each artery. Tell your health care provider about any allergies you have, including  allergies to contrast dye. After the procedure, you will need to lie still for a few hours and drink plenty of fluids. This information is not intended to replace advice given to you by your health care provider. Make sure you discuss any questions you have with your health care provider. Document Revised: 12/01/2018 Document Reviewed: 12/01/2018 Elsevier Patient Education  [place].

## 2021-06-11 NOTE — Addendum Note (Signed)
Addended by: Alvis Lemmings C on: 06/11/2021 03:16 PM   Modules accepted: Orders

## 2021-06-11 NOTE — Progress Notes (Signed)
Cardiology Office Note:    Date:  06/11/2021   ID:  Henry Duncan, DOB 02-05-1991, MRN [id number]  PCP:  Henry Hess, MD  [facility] Cardiologist:  Henry Sable, MD  [facility] Electrophysiologist:  None   Referring MD: Henry Hess, MD   Chief Complaint  Patient presents with   Other    Follow up post CT -- Chest pressure. Meds reviewed verbally with patient.     History of Present Illness:    Henry Duncan is a 31 y.o. 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 Jude's mechanical valve, developed pericardial effusion postoperatively and underwent pericardial window 08/2018) who presents for hospital follow-up.  Previously seen for symptoms of chest pain.  Underwent coronary CTA to evaluate obstructive CAD.  Still has occasional chest pressure with exertion.  Coronary CTA 05/2021 showed significant mid LAD stenosis, calcium score 958.  Prior notes Patient presented to the hospital on 09/13/2018 due to chest pain.  CT did reveal ascending thoracic aorta aneurysm measuring 5.7 to 5.8 cm, localized partial dissection of the anterior middle ascending thoracic aorta.  He was subsequently taken for surgery and underwent Bentall procedure.  Last transthoracic echocardiogram 09/16/2018 showed normal systolic and diastolic function, EF 60 to 65%.  Intraoperative TEE on 09/19/2018 showed normal bioprosthetic valve with normal washing jets, transaortic gradient 7 mmHg.  Coronary CTA 2020, calcium score 476, moderate mid LAD disease   Past Medical History:  Diagnosis Date   Anxiety    Aortic aneurysm ([place]) 09/16/2018   a. 08/2018 Asc Ao/root aneuryms w/ dissection-->s/p Bentall w/ 41mm Hemashield graft, 35mm SJM AVR, and reimplantation of cor arteries.   CAD (coronary artery disease)    a. 08/2018 Cor CTA: Ca2+  99th%'ile (476). Calcified plaque in mLAD. Nl LM, LCX, RCA. S/p reimplantation of coronary arteries in setting of Bentall.   H/O mechanical aortic valve replacement    a. 08/2018 in setting of bicuspid AoV and Asc Ao dissection-->50mm SJM mech AVR; b. 03/2020 Echo: EF 55-60%. No rwma. Mild LVH. Nl RV fxn. Nl functioning AVR.   Hypertension    Pericardial effusion    a. 08/2018 post-op -->s/p window.    Past Surgical History:  Procedure Laterality Date   BENTALL PROCEDURE N/A 09/19/2018   Procedure: BENTALL PROCEDURE WITH 23 Luke.;  Surgeon: Gaye Pollack, MD;  Location: [facility];  Service: Open Heart Surgery;  Laterality: N/A;   CARDIAC SURGERY     CLEFT LIP REPAIR     SUBXYPHOID PERICARDIAL WINDOW N/A 09/26/2018   Procedure: SUBXYPHOID PERICARDIAL WINDOW;  Surgeon: Gaye Pollack, MD;  Location: [facility];  Service: Thoracic;  Laterality: N/A;   TEE WITHOUT CARDIOVERSION N/A 09/19/2018   Procedure: TRANSESOPHAGEAL ECHOCARDIOGRAM (TEE);  Surgeon: Gaye Pollack, MD;  Location: [facility];  Service: Open Heart Surgery;  Laterality: N/A;    Current Medications: Current Meds  Medication Sig   atorvastatin (LIPITOR) 80 MG tablet Take 1 tablet (80 mg total) by mouth daily.   cetirizine (ZYRTEC) 10 MG tablet Take 10 mg by mouth daily as needed for allergies.   clobetasol cream (TEMOVATE) 6.20 % Apply 1 application topically 2 (two) times daily. To external ear canals   metoprolol succinate (TOPROL-XL) 50 MG 24 hr tablet Take 1 1/2 tabs (75mg ) by mouth twice a day   warfarin (  COUMADIN) 5 MG tablet Take 1.5 tablets by mouth daily or as directed by the Anticoagulation Clinic.     Allergies:   Patient has no known allergies.   Social History   Socioeconomic History   Marital status: Single    Spouse name: Not on file   Number of children: 0   Years of education: Not on file   Highest education level: Not on file  Occupational History   Not on file  Tobacco Use   Smoking status:  Never   Smokeless tobacco: Never  Vaping Use   Vaping Use: Former  Substance and Sexual Activity   Alcohol use: Yes    Comment: rare   Drug use: No   Sexual activity: Yes    Birth control/protection: Condom  Other Topics Concern   Not on file  Social History Narrative   Not on file   Social Determinants of Health   Financial Resource Strain: Not on file  Food Insecurity: Not on file  Transportation Needs: Not on file  Physical Activity: Not on file  Stress: Not on file  Social Connections: Not on file     Family History: The patient's family history includes Anxiety disorder in his brother and father; Depression in his brother; Diabetes in his father; Heart attack in his mother.  ROS:   Please see the history of present illness.     All other systems reviewed and are negative.  EKGs/Labs/Other Studies Reviewed:    The following studies were reviewed today:   EKG:  EKG not ordered today.    Recent Labs: 09/03/2020: ALT 47; TSH 0.559 05/23/2021: BUN 13; Creatinine, Ser 0.57; Hemoglobin 16.3; Platelets 215; Potassium 3.8; Sodium 134  Recent Lipid Panel    Component Value Date/Time   CHOL 170 09/03/2020 0829   TRIG 146 09/03/2020 0829   HDL 33 (L) 09/03/2020 0829   CHOLHDL 5.2 (H) 09/03/2020 0829   LDLCALC 111 (H) 09/03/2020 0829     Risk Assessment/Calculations:      Physical Exam:    VS:  BP 110/70 (BP Location: Left Arm, Patient Position: Sitting, Cuff Size: Normal)    Pulse 85    Ht 5\' 7"  (1.702 m)    Wt 197 lb (89.4 kg)    SpO2 97%    BMI 30.85 kg/m     Wt Readings from Last 3 Encounters:  06/11/21 197 lb (89.4 kg)  06/02/21 197 lb (89.4 kg)  05/23/21 195 lb (88.5 kg)     GEN:  Well nourished, well developed in no acute distress HEENT: Normal NECK: No JVD; No carotid bruits LYMPHATICS: No lymphadenopathy CARDIAC: RRR, 2/6 systolic murmur, mechanical click consistent with mechanical valve noted. RESPIRATORY:  Clear to auscultation without rales,  wheezing or rhonchi  ABDOMEN: Soft, non-tender, non-distended MUSCULOSKELETAL:  No edema; No deformity  SKIN: Warm and dry NEUROLOGIC:  Alert and oriented x 3 PSYCHIATRIC:  Normal affect   ASSESSMENT:    1. Coronary artery disease involving native coronary artery of native heart, unspecified whether angina present   2. Thoracic aortic aneurysm without rupture, unspecified part   3. H/O mechanical aortic valve replacement   4. Pure hypercholesterolemia     PLAN:    In order of problems listed above:  Chest pain, history of CAD.  CCTA 05/2021 severe mid LAD stenosis, calcium score 958.  FFRct did not show significant stenosis.  Plan left heart cath.  Start Imdur 15 mg daily.  Continue Toprol-XL, Lipitor, warfarin.  Hold warfarin  48 hours prior to left heart cath.  Bridge Coumadin with Lovenox. Patient with history of aortic aneurysm, dissection, status post Bentall procedure with aortic root replacement and mechanical aortic valve. CT angio aorta obtained 04/2021 showed normal aortic graft with no postop complications.  Continue monitoring with yearly CT scan for aortic root size and or any postop complications. Continue Lopressor 75 mg twice daily. History of mechanical aortic valve replacement, keep appointments with Coumadin clinic for frequent INR checks.  Previous gradient was 7.5 mmHg a year ago.  Echo 03/2020 showed normal functioning prosthetic valve. hyperlipidemia, cont Lipitor to 80 mg daily.  Follow-up in 1 month  Total encounter time 40 minutes  Greater than 50% was spent in counseling and coordination of care with the patient   Medication Adjustments/Labs and Tests Ordered: Current medicines are reviewed at length with the patient today.  Concerns regarding medicines are outlined above.  No orders of the defined types were placed in this encounter.  No orders of the defined types were placed in this encounter.   There are no Patient Instructions on file for this visit.    Signed, Henry Sable, MD  06/11/2021 2:33 PM    [place]

## 2021-06-11 NOTE — H&P (View-Only) (Signed)
Cardiology Office Note:    Date:  06/11/2021   ID:  [name], DOB Feb 05, 1991, MRN [id number]  PCP:  Glean Hess, MD  [facility] Cardiologist:  Kate Sable, MD  [place] Electrophysiologist:  None   Referring MD: Glean Hess, MD   Chief Complaint  Patient presents with   Other    Follow up post CT -- Chest pressure. Meds reviewed verbally with patient.     History of Present Illness:    Henry Duncan is a 31 y.o. male with a hx of hypertension, CAD (Ca2+ 1 mod mLAD dz Cor CTA 2020), aortic aneurysm, bicuspid aortic valve, ascending thoracic aorta dissection s/p Bentall procedure (with 23 mm St. Jude's mechanical valve, developed pericardial effusion postoperatively and underwent pericardial window 08/2018) who presents for hospital follow-up.  Previously seen for symptoms of chest pain.  Underwent coronary CTA to evaluate obstructive CAD.  Still has occasional chest pressure with exertion.  Coronary CTA 05/2021 showed significant mid LAD stenosis, calcium score 958.  Prior notes Patient presented to the hospital on 09/13/2018 due to chest pain.  CT did reveal ascending thoracic aorta aneurysm measuring 5.7 to 5.8 cm, localized partial dissection of the anterior middle ascending thoracic aorta.  He was subsequently taken for surgery and underwent Bentall procedure.  Last transthoracic echocardiogram 09/16/2018 showed normal systolic and diastolic function, EF 60 to 65%.  Intraoperative TEE on 09/19/2018 showed normal bioprosthetic valve with normal washing jets, transaortic gradient 7 mmHg.  Coronary CTA 2020, calcium score 476, moderate mid LAD disease   Past Medical History:  Diagnosis Date   Anxiety    Aortic aneurysm ([place]) 09/16/2018   a. 08/2018 Asc Ao/root aneuryms w/ dissection-->s/p Bentall w/ 54mm Hemashield graft, 18mm SJM AVR, and reimplantation of cor arteries.   CAD (coronary artery disease)    a. 08/2018 Cor CTA: Ca2+  99th%'ile (476). Calcified plaque in mLAD. Nl LM, LCX, RCA. S/p reimplantation of coronary arteries in setting of Bentall.   H/O mechanical aortic valve replacement    a. 08/2018 in setting of bicuspid AoV and Asc Ao dissection-->52mm SJM mech AVR; b. 03/2020 Echo: EF 55-60%. No rwma. Mild LVH. Nl RV fxn. Nl functioning AVR.   Hypertension    Pericardial effusion    a. 08/2018 post-op -->s/p window.    Past Surgical History:  Procedure Laterality Date   BENTALL PROCEDURE N/A 09/19/2018   Procedure: BENTALL PROCEDURE WITH 23 [place].;  Surgeon: Gaye Pollack, MD;  Location: [place];  Service: Open Heart Surgery;  Laterality: N/A;   CARDIAC SURGERY     CLEFT LIP REPAIR     SUBXYPHOID PERICARDIAL WINDOW N/A 09/26/2018   Procedure: SUBXYPHOID PERICARDIAL WINDOW;  Surgeon: Gaye Pollack, MD;  [facility];  Service: Thoracic;  Laterality: N/A;   TEE WITHOUT CARDIOVERSION N/A 09/19/2018   Procedure: TRANSESOPHAGEAL ECHOCARDIOGRAM (TEE);  Surgeon: Gaye Pollack, MD;  Location: [place];  Service: Open Heart Surgery;  Laterality: N/A;    Current Medications: Current Meds  Medication Sig   atorvastatin (LIPITOR) 80 MG tablet Take 1 tablet (80 mg total) by mouth daily.   cetirizine (ZYRTEC) 10 MG tablet Take 10 mg by mouth daily as needed for allergies.   clobetasol cream (TEMOVATE) 9.48 % Apply 1 application topically 2 (two) times daily. To external ear canals   metoprolol succinate (TOPROL-XL) 50 MG 24 hr tablet Take 1 1/2 tabs (75mg ) by mouth twice a day   warfarin (  COUMADIN) 5 MG tablet Take 1.5 tablets by mouth daily or as directed by the Anticoagulation Clinic.     Allergies:   Patient has no known allergies.   Social History   Socioeconomic History   Marital status: Single    Spouse name: Not on file   Number of children: 0   Years of education: Not on file   Highest education level: Not on file  Occupational History   Not on file  Tobacco Use   Smoking status:  Never   Smokeless tobacco: Never  Vaping Use   Vaping Use: Former  Substance and Sexual Activity   Alcohol use: Yes    Comment: rare   Drug use: No   Sexual activity: Yes    Birth control/protection: Condom  Other Topics Concern   Not on file  Social History Narrative   Not on file   Social Determinants of Health   Financial Resource Strain: Not on file  Food Insecurity: Not on file  Transportation Needs: Not on file  Physical Activity: Not on file  Stress: Not on file  Social Connections: Not on file     Family History: The patient's family history includes Anxiety disorder in his brother and father; Depression in his brother; Diabetes in his father; Heart attack in his mother.  ROS:   Please see the history of present illness.     All other systems reviewed and are negative.  EKGs/Labs/Other Studies Reviewed:    The following studies were reviewed today:   EKG:  EKG not ordered today.    Recent Labs: 09/03/2020: ALT 47; TSH 0.559 05/23/2021: BUN 13; Creatinine, Ser 0.57; Hemoglobin 16.3; Platelets 215; Potassium 3.8; Sodium 134  Recent Lipid Panel    Component Value Date/Time   CHOL 170 09/03/2020 0829   TRIG 146 09/03/2020 0829   HDL 33 (L) 09/03/2020 0829   CHOLHDL 5.2 (H) 09/03/2020 0829   LDLCALC 111 (H) 09/03/2020 0829     Risk Assessment/Calculations:      Physical Exam:    VS:  BP 110/70 (BP Location: Left Arm, Patient Position: Sitting, Cuff Size: Normal)    Pulse 85    Ht 5\' 7"  (1.702 m)    Wt 197 lb (89.4 kg)    SpO2 97%    BMI 30.85 kg/m     Wt Readings from Last 3 Encounters:  06/11/21 197 lb (89.4 kg)  06/02/21 197 lb (89.4 kg)  05/23/21 195 lb (88.5 kg)     GEN:  Well nourished, well developed in no acute distress HEENT: Normal NECK: No JVD; No carotid bruits LYMPHATICS: No lymphadenopathy CARDIAC: RRR, 2/6 systolic murmur, mechanical click consistent with mechanical valve noted. RESPIRATORY:  Clear to auscultation without rales,  wheezing or rhonchi  ABDOMEN: Soft, non-tender, non-distended MUSCULOSKELETAL:  No edema; No deformity  SKIN: Warm and dry NEUROLOGIC:  Alert and oriented x 3 PSYCHIATRIC:  Normal affect   ASSESSMENT:    1. Coronary artery disease involving native coronary artery of native heart, unspecified whether angina present   2. Thoracic aortic aneurysm without rupture, unspecified part   3. H/O mechanical aortic valve replacement   4. Pure hypercholesterolemia     PLAN:    In order of problems listed above:  Chest pain, history of CAD.  CCTA 05/2021 severe mid LAD stenosis, calcium score 958.  FFRct did not show significant stenosis.  Plan left heart cath.  Start Imdur 15 mg daily.  Continue Toprol-XL, Lipitor, warfarin.  Hold warfarin  48 hours prior to left heart cath.  Bridge Coumadin with Lovenox. Patient with history of aortic aneurysm, dissection, status post Bentall procedure with aortic root replacement and mechanical aortic valve. CT angio aorta obtained 04/2021 showed normal aortic graft with no postop complications.  Continue monitoring with yearly CT scan for aortic root size and or any postop complications. Continue Lopressor 75 mg twice daily. History of mechanical aortic valve replacement, keep appointments with Coumadin clinic for frequent INR checks.  Previous gradient was 7.5 mmHg a year ago.  Echo 03/2020 showed normal functioning prosthetic valve. hyperlipidemia, cont Lipitor to 80 mg daily.  Follow-up in 1 month  Total encounter time 40 minutes  Greater than 50% was spent in counseling and coordination of care with the patient   Medication Adjustments/Labs and Tests Ordered: Current medicines are reviewed at length with the patient today.  Concerns regarding medicines are outlined above.  No orders of the defined types were placed in this encounter.  No orders of the defined types were placed in this encounter.   There are no Patient Instructions on file for this visit.    Signed, Kate Sable, MD  06/11/2021 2:33 PM    [place]

## 2021-06-11 NOTE — Telephone Encounter (Signed)
°  06/09/20: Inject enoxaparin in the fatty tissue  tonight at 8pm. No warfarin.  06/10/20: Inject enoxaparin in the fatty tissue in the morning at 8 am (No PM dose). No warfarin.  06/11/20: Procedure Day - No enoxaparin - Resume warfarin in the evening or as directed by doctor (take an extra half tablet with usual dose for 2 days then resume normal dose).  06/12/20: Resume enoxaparin inject in the fatty tissue every 12 hours and take warfarin  06/13/20: Inject enoxaparin in the fatty tissue every 12 hours and take warfarin  06/14/20: Inject enoxaparin in the fatty tissue every 12 hours and take warfarin  06/15/20: Inject enoxaparin in the fatty tissue every 12 hours and take warfarin  06/16/20: Inject enoxaparin in the fatty tissue every 12 hours and take warfarin  06/17/20: warfarin appt to check INR.

## 2021-06-12 ENCOUNTER — Telehealth: Payer: Self-pay | Admitting: Cardiology

## 2021-06-12 ENCOUNTER — Other Ambulatory Visit: Payer: Self-pay | Admitting: *Deleted

## 2021-06-12 LAB — CBC
Hematocrit: 47.8 % (ref 37.5–51.0)
Hemoglobin: 16.5 g/dL (ref 13.0–17.7)
MCH: 31.4 pg (ref 26.6–33.0)
MCHC: 34.5 g/dL (ref 31.5–35.7)
MCV: 91 fL (ref 79–97)
Platelets: 208 10*3/uL (ref 150–450)
RBC: 5.25 x10E6/uL (ref 4.14–5.80)
RDW: 13.2 % (ref 11.6–15.4)
WBC: 10 10*3/uL (ref 3.4–10.8)

## 2021-06-12 LAB — BASIC METABOLIC PANEL
BUN/Creatinine Ratio: 15 (ref 9–20)
BUN: 11 mg/dL (ref 6–20)
CO2: 17 mmol/L — ABNORMAL LOW (ref 20–29)
Calcium: 10 mg/dL (ref 8.7–10.2)
Chloride: 101 mmol/L (ref 96–106)
Creatinine, Ser: 0.73 mg/dL — ABNORMAL LOW (ref 0.76–1.27)
Glucose: 84 mg/dL (ref 70–99)
Potassium: 4.3 mmol/L (ref 3.5–5.2)
Sodium: 138 mmol/L (ref 134–144)
eGFR: 126 mL/min/{1.73_m2} (ref >59)

## 2021-06-12 MED ORDER — ALPRAZOLAM 0.5 MG PO TABS: ORAL_TABLET | ORAL | 0 refills | Status: DC

## 2021-06-12 NOTE — Telephone Encounter (Signed)
Looks like Water quality scientist, RN sent this in today.   ALPRAZolam (XANAX) 0.5 MG tablet 3 tablet 0 06/12/2021    Sig: Take 1 tablet (0.5 mg) by mouth once daily as needed for anxiety   Class: Phone In

## 2021-06-12 NOTE — Telephone Encounter (Signed)
°*  STAT* If patient is at the pharmacy, call can be transferred to refill team.   1. Which medications need to be refilled? (please list name of each medication and dose if known) Xanax 0.5mg  1 tablet daily  2. Which pharmacy/location (including street and city if local pharmacy) is medication to be sent to? [facility] [place]  3. Do they need a 30 day or 90 day supply? 3 tablet

## 2021-06-12 NOTE — Progress Notes (Signed)
Xanax 0.5 mg- take 1 tablet daily as needed for anxiety (pre cath) # 3 tablets w/ no refills  Called in to [facility]

## 2021-06-13 ENCOUNTER — Encounter
Admission: RE | Disposition: A | Discharge status: Home / Self Care | Payer: Self-pay | Source: Home / Self Care | Attending: Cardiovascular Disease

## 2021-06-13 ENCOUNTER — Other Ambulatory Visit: Payer: Self-pay

## 2021-06-13 ENCOUNTER — Ambulatory Visit
Admission: RE | Admit: 2021-06-13 | Discharge: 2021-06-13 | Disposition: A | Discharge status: Home / Self Care | Payer: BC Managed Care – PPO | Attending: Cardiovascular Disease | Admitting: Cardiovascular Disease

## 2021-06-13 ENCOUNTER — Encounter: Payer: Self-pay | Admitting: Cardiovascular Disease

## 2021-06-13 DIAGNOSIS — I712 Thoracic aortic aneurysm, without rupture, unspecified: Secondary | ICD-10-CM | POA: Insufficient documentation

## 2021-06-13 DIAGNOSIS — I2510 Atherosclerotic heart disease of native coronary artery without angina pectoris: Secondary | ICD-10-CM | POA: Diagnosis not present

## 2021-06-13 DIAGNOSIS — R931 Abnormal findings on diagnostic imaging of heart and coronary circulation: Secondary | ICD-10-CM

## 2021-06-13 DIAGNOSIS — I10 Essential (primary) hypertension: Secondary | ICD-10-CM | POA: Insufficient documentation

## 2021-06-13 DIAGNOSIS — E7800 Pure hypercholesterolemia, unspecified: Secondary | ICD-10-CM | POA: Diagnosis not present

## 2021-06-13 DIAGNOSIS — I25119 Atherosclerotic heart disease of native coronary artery with unspecified angina pectoris: Secondary | ICD-10-CM | POA: Insufficient documentation

## 2021-06-13 DIAGNOSIS — Z7901 Long term (current) use of anticoagulants: Secondary | ICD-10-CM | POA: Diagnosis not present

## 2021-06-13 DIAGNOSIS — Z79899 Other long term (current) drug therapy: Secondary | ICD-10-CM | POA: Insufficient documentation

## 2021-06-13 DIAGNOSIS — Z952 Presence of prosthetic heart valve: Secondary | ICD-10-CM | POA: Diagnosis not present

## 2021-06-13 HISTORY — PX: CORONARY PRESSURE/FFR WITH 3D MAPPING: CATH118309

## 2021-06-13 HISTORY — PX: CORONARY ANGIOGRAPHY: CATH118303

## 2021-06-13 LAB — POCT ACTIVATED CLOTTING TIME: Activated Clotting Time: 348 seconds

## 2021-06-13 SURGERY — CORONARY ANGIOGRAPHY (CATH LAB)
Anesthesia: Moderate Sedation

## 2021-06-13 MED ORDER — SODIUM CHLORIDE 0.9 % WEIGHT BASED INFUSION
1.0000 mL/kg/h | INTRAVENOUS | Status: DC
  Administered 2021-06-13: 1 mL/kg/h via INTRAVENOUS

## 2021-06-13 MED ORDER — HEPARIN (PORCINE) IN NACL 1000-0.9 UT/500ML-% IV SOLN
INTRAVENOUS | Status: DC | PRN
  Administered 2021-06-13: 1000 mL

## 2021-06-13 MED ORDER — SODIUM CHLORIDE 0.9% FLUSH: 3.0000 mL | INTRAVENOUS | Status: DC | PRN

## 2021-06-13 MED ORDER — MIDAZOLAM HCL 2 MG/2ML IJ SOLN
INTRAMUSCULAR | Status: AC
  Filled 2021-06-13: qty 2

## 2021-06-13 MED ORDER — FENTANYL CITRATE (PF) 100 MCG/2ML IJ SOLN
INTRAMUSCULAR | Status: AC
  Filled 2021-06-13: qty 2

## 2021-06-13 MED ORDER — SODIUM CHLORIDE 0.9% FLUSH: 3.0000 mL | Freq: Two times a day (BID) | INTRAVENOUS | Status: DC

## 2021-06-13 MED ORDER — VERAPAMIL HCL 2.5 MG/ML IV SOLN
INTRAVENOUS | Status: DC | PRN
  Administered 2021-06-13 (×2): 2.5 mg via INTRA_ARTERIAL

## 2021-06-13 MED ORDER — SODIUM CHLORIDE 0.9 % IV SOLN: INTRAVENOUS | Status: DC

## 2021-06-13 MED ORDER — IOHEXOL 300 MG/ML  SOLN
INTRAMUSCULAR | Status: DC | PRN
  Administered 2021-06-13: 96 mL

## 2021-06-13 MED ORDER — SODIUM CHLORIDE 0.9 % IV SOLN: 250.0000 mL | INTRAVENOUS | Status: DC | PRN

## 2021-06-13 MED ORDER — SODIUM CHLORIDE 0.9 % WEIGHT BASED INFUSION
3.0000 mL/kg/h | INTRAVENOUS | Status: AC
  Administered 2021-06-13: 3 mL/kg/h via INTRAVENOUS

## 2021-06-13 MED ORDER — ACETAMINOPHEN 325 MG PO TABS: 650.0000 mg | ORAL_TABLET | ORAL | Status: DC | PRN

## 2021-06-13 MED ORDER — MIDAZOLAM HCL 2 MG/2ML IJ SOLN
INTRAMUSCULAR | Status: DC | PRN
  Administered 2021-06-13 (×2): 1 mg via INTRAVENOUS

## 2021-06-13 MED ORDER — ONDANSETRON HCL 4 MG/2ML IJ SOLN: 4.0000 mg | Freq: Four times a day (QID) | INTRAMUSCULAR | Status: DC | PRN

## 2021-06-13 MED ORDER — HEPARIN SODIUM (PORCINE) 1000 UNIT/ML IJ SOLN
INTRAMUSCULAR | Status: DC | PRN
  Administered 2021-06-13: 4500 [IU] via INTRAVENOUS
  Administered 2021-06-13: 4000 [IU] via INTRAVENOUS

## 2021-06-13 MED ORDER — HEPARIN SODIUM (PORCINE) 1000 UNIT/ML IJ SOLN
INTRAMUSCULAR | Status: AC
  Filled 2021-06-13: qty 10

## 2021-06-13 MED ORDER — FENTANYL CITRATE (PF) 100 MCG/2ML IJ SOLN
INTRAMUSCULAR | Status: DC | PRN
  Administered 2021-06-13 (×2): 25 ug via INTRAVENOUS

## 2021-06-13 MED ORDER — VERAPAMIL HCL 2.5 MG/ML IV SOLN
INTRAVENOUS | Status: AC
  Filled 2021-06-13: qty 2

## 2021-06-13 SURGICAL SUPPLY — 15 items
CATH 5F 110X4 TIG (CATHETERS) ×1 IMPLANT
CATH 5FR JR4 DIAGNOSTIC (CATHETERS) ×1 IMPLANT
CATH LAUNCHER 6FR JL3.5 (CATHETERS) ×1 IMPLANT
DEVICE RAD TR BAND REGULAR (VASCULAR PRODUCTS) ×1 IMPLANT
DRAPE BRACHIAL (DRAPES) ×1 IMPLANT
GLIDESHEATH SLEND SS 6F .021 (SHEATH) ×1 IMPLANT
GUIDEWIRE INQWIRE 1.5J.035X260 (WIRE) IMPLANT
GUIDEWIRE PRESS OMNI 185 ST (WIRE) ×1 IMPLANT
INQWIRE 1.5J .035X260CM (WIRE) ×3
KIT ENCORE 26 ADVANTAGE (KITS) ×1 IMPLANT
PACK CARDIAC CATH (CUSTOM PROCEDURE TRAY) ×3 IMPLANT
PROTECTION STATION PRESSURIZED (MISCELLANEOUS) ×3
SET ATX SIMPLICITY (MISCELLANEOUS) ×1 IMPLANT
STATION PROTECTION PRESSURIZED (MISCELLANEOUS) IMPLANT
TUBING CIL FLEX 10 FLL-RA (TUBING) ×1 IMPLANT

## 2021-06-13 NOTE — Interval H&P Note (Signed)
Cath Lab Visit (complete for each Cath Lab visit)  Clinical Evaluation Leading to the Procedure:   ACS: No.  Non-ACS:    Anginal Classification: CCS III  Anti-ischemic medical therapy: Maximal Therapy (2 or more classes of medications)  Non-Invasive Test Results: Low-risk stress test findings: cardiac mortality <1%/year  Prior CABG: No previous CABG      History and Physical Interval Note:  06/13/2021 1:18 PM  Henry Duncan  has presented today for surgery, with the diagnosis of LT Cath   Abnormal cardiac CT.  The various methods of treatment have been discussed with the patient and family. After consideration of risks, benefits and other options for treatment, the patient has consented to  Procedure(s): LEFT HEART CATH AND CORONARY ANGIOGRAPHY (Left) as a surgical intervention.  The patient's history has been reviewed, patient examined, no change in status, stable for surgery.  I have reviewed the patient's chart and labs.  Questions were answered to the patient's satisfaction.     Kathlyn [place]

## 2021-06-14 ENCOUNTER — Encounter: Payer: Self-pay | Admitting: Cardiology

## 2021-06-16 ENCOUNTER — Encounter: Payer: Self-pay | Admitting: Cardiovascular Disease

## 2021-06-17 ENCOUNTER — Other Ambulatory Visit: Payer: Self-pay | Admitting: Internal Medicine

## 2021-06-17 ENCOUNTER — Encounter: Payer: Self-pay | Admitting: Internal Medicine

## 2021-06-17 DIAGNOSIS — K760 Fatty (change of) liver, not elsewhere classified: Secondary | ICD-10-CM | POA: Diagnosis not present

## 2021-06-17 DIAGNOSIS — R7309 Other abnormal glucose: Secondary | ICD-10-CM | POA: Diagnosis not present

## 2021-06-18 ENCOUNTER — Ambulatory Visit (INDEPENDENT_AMBULATORY_CARE_PROVIDER_SITE_OTHER): Payer: BC Managed Care – PPO

## 2021-06-18 ENCOUNTER — Other Ambulatory Visit: Payer: Self-pay

## 2021-06-18 DIAGNOSIS — Z952 Presence of prosthetic heart valve: Secondary | ICD-10-CM

## 2021-06-18 DIAGNOSIS — Z5181 Encounter for therapeutic drug level monitoring: Secondary | ICD-10-CM | POA: Diagnosis not present

## 2021-06-18 LAB — COMPREHENSIVE METABOLIC PANEL
ALT: 59 IU/L — ABNORMAL HIGH (ref 0–44)
AST: 30 IU/L (ref 0–40)
Albumin/Globulin Ratio: 1.8 (ref 1.2–2.2)
Albumin: 4.7 g/dL (ref 4.1–5.2)
Alkaline Phosphatase: 100 IU/L (ref 44–121)
BUN/Creatinine Ratio: 12 (ref 9–20)
BUN: 8 mg/dL (ref 6–20)
Bilirubin Total: 0.3 mg/dL (ref 0.0–1.2)
CO2: 24 mmol/L (ref 20–29)
Calcium: 9.3 mg/dL (ref 8.7–10.2)
Chloride: 103 mmol/L (ref 96–106)
Creatinine, Ser: 0.65 mg/dL — ABNORMAL LOW (ref 0.76–1.27)
Globulin, Total: 2.6 g/dL (ref 1.5–4.5)
Glucose: 82 mg/dL (ref 70–99)
Potassium: 4.2 mmol/L (ref 3.5–5.2)
Sodium: 144 mmol/L (ref 134–144)
Total Protein: 7.3 g/dL (ref 6.0–8.5)
eGFR: 131 mL/min/{1.73_m2} (ref >59)

## 2021-06-18 LAB — POCT INR: INR: 2.2 (ref 2.0–3.0)

## 2021-06-18 LAB — HEMOGLOBIN A1C
Est. average glucose Bld gHb Est-mCnc: 111 mg/dL
Hgb A1c MFr Bld: 5.5 % (ref 4.8–5.6)

## 2021-06-18 NOTE — Patient Instructions (Signed)
-   STOP LOVENOX SHOTS!! - take 2 tablets warfarin tonight, then - resume warfarin dosage of 1.5 tablets every day - recheck INR in 6 weeks

## 2021-06-24 ENCOUNTER — Ambulatory Visit (INDEPENDENT_AMBULATORY_CARE_PROVIDER_SITE_OTHER): Payer: BC Managed Care – PPO | Admitting: Cardiology

## 2021-06-24 ENCOUNTER — Other Ambulatory Visit: Payer: Self-pay

## 2021-06-24 ENCOUNTER — Encounter: Payer: Self-pay | Admitting: Internal Medicine

## 2021-06-24 ENCOUNTER — Encounter: Payer: Self-pay | Admitting: Cardiology

## 2021-06-24 ENCOUNTER — Ambulatory Visit: Payer: BC Managed Care – PPO | Admitting: Internal Medicine

## 2021-06-24 VITALS — BP 110/64 | HR 70 | Ht 67.0 in | Wt 196.2 lb

## 2021-06-24 DIAGNOSIS — I712 Thoracic aortic aneurysm, without rupture, unspecified: Secondary | ICD-10-CM

## 2021-06-24 DIAGNOSIS — I25119 Atherosclerotic heart disease of native coronary artery with unspecified angina pectoris: Secondary | ICD-10-CM | POA: Diagnosis not present

## 2021-06-24 DIAGNOSIS — E7800 Pure hypercholesterolemia, unspecified: Secondary | ICD-10-CM

## 2021-06-24 DIAGNOSIS — I2510 Atherosclerotic heart disease of native coronary artery without angina pectoris: Secondary | ICD-10-CM

## 2021-06-24 DIAGNOSIS — Z952 Presence of prosthetic heart valve: Secondary | ICD-10-CM

## 2021-06-24 DIAGNOSIS — Z79899 Other long term (current) drug therapy: Secondary | ICD-10-CM | POA: Diagnosis not present

## 2021-06-24 DIAGNOSIS — Z7901 Long term (current) use of anticoagulants: Secondary | ICD-10-CM | POA: Diagnosis not present

## 2021-06-24 DIAGNOSIS — I10 Essential (primary) hypertension: Secondary | ICD-10-CM | POA: Diagnosis not present

## 2021-06-24 MED ORDER — ISOSORBIDE MONONITRATE ER 30 MG PO TB24: 15.0000 mg | ORAL_TABLET | Freq: Every day | ORAL | 2 refills | Status: DC

## 2021-06-24 NOTE — Progress Notes (Signed)
Cardiology Office Note:    Date:  06/24/2021   ID:  Henry Duncan, DOB 1990-09-02, MRN [id number]  PCP:  Glean Hess, MD  [facility] Cardiologist:  Kate Sable, MD  [place] Electrophysiologist:  None   Referring MD: Glean Hess, MD   Chief Complaint  Patient presents with   Other    Post cath c/o chest pressure with occasional sharp pain. Meds reviewed verbally with pt.    History of Present Illness:    Henry Duncan is a 31 y.o. male with a hx of hypertension, CAD (lhc 05/2021-50% mid LAD), aortic aneurysm, bicuspid aortic valve, ascending thoracic aorta dissection s/p Bentall procedure (with 23 mm St. Jude's mechanical valve, developed pericardial effusion postoperatively and underwent pericardial window 08/2018) who presents for hospital follow-up.  Previously seen due to chest pain/pressure.  Underwent CTA with moderate LAD, significantly elevated calcium score.  Had left heart cath performed showing 50% mid LAD disease.  Invasive FFR with no significant stenosis.  He still has occasional nonspecific chest pressure, otherwise feels okay.  Tolerating warfarin, Lipitor with no adverse effects.  Started on Imdur 15 mg daily with minimal improvement.    Prior notes LHC 05/2021 mid LAD 50%.  IFFR 0.95   CCTA 05/2021 severe mid LAD stenosis, calcium score 958.  FFRct did not show significant stenosis TTE 03/2020 EF 55 to 60%, normal functioning prosthetic valve.  Patient presented to the hospital on 09/13/2018 due to chest pain.  CT did reveal ascending thoracic aorta aneurysm measuring 5.7 to 5.8 cm, localized partial dissection of the anterior middle ascending thoracic aorta.  He was subsequently taken for surgery and underwent Bentall procedure.  Last transthoracic echocardiogram 09/16/2018 showed normal systolic and diastolic function, EF 60 to 65%.  Intraoperative TEE on 09/19/2018 showed normal bioprosthetic valve with normal washing jets,  transaortic gradient 7 mmHg.  Coronary CTA 2020, calcium score 476, moderate mid LAD disease   Past Medical History:  Diagnosis Date   Anxiety    Aortic aneurysm ([place]) 09/16/2018   a. 08/2018 Asc Ao/root aneuryms w/ dissection-->s/p Bentall w/ 70mm Hemashield graft, 59mm SJM AVR, and reimplantation of cor arteries.   CAD (coronary artery disease)    a. 08/2018 Cor CTA: Ca2+ 99th%'ile (476). Calcified plaque in mLAD. Nl LM, LCX, RCA. S/p reimplantation of coronary arteries in setting of Bentall.   H/O mechanical aortic valve replacement    a. 08/2018 in setting of bicuspid AoV and Asc Ao dissection-->44mm SJM mech AVR; b. 03/2020 Echo: EF 55-60%. No rwma. Mild LVH. Nl RV fxn. Nl functioning AVR.   Hypertension    Pericardial effusion    a. 08/2018 post-op -->s/p window.    Past Surgical History:  Procedure Laterality Date   BENTALL PROCEDURE N/A 09/19/2018   Procedure: BENTALL PROCEDURE WITH 23 [place].;  Surgeon: Gaye Pollack, MD;  Location: [place];  Service: Open Heart Surgery;  Laterality: N/A;   CARDIAC SURGERY     CLEFT LIP REPAIR     CORONARY ANGIOGRAPHY Left 06/13/2021   Procedure: CORONARY ANGIOGRAPHY (CATH LAB);  Surgeon: Wellington Hampshire, MD;  Location: [place] CV LAB;  Service: Cardiovascular;  Laterality: Left;   CORONARY PRESSURE WIRE/FFR WITH 3D MAPPING N/A 06/13/2021   Procedure: Coronary Pressure Wire, IFR;  Surgeon: Wellington Hampshire, MD;  Location: [place] CV LAB;  Service: Cardiovascular;  Laterality: N/A;   SUBXYPHOID PERICARDIAL WINDOW N/A 09/26/2018   Procedure: SUBXYPHOID PERICARDIAL WINDOW;  Surgeon:  Gaye Pollack, MD;  Location: [facility];  Service: Thoracic;  Laterality: N/A;   TEE WITHOUT CARDIOVERSION N/A 09/19/2018   Procedure: TRANSESOPHAGEAL ECHOCARDIOGRAM (TEE);  Surgeon: Gaye Pollack, MD;  Location: [place];  Service: Open Heart Surgery;  Laterality: N/A;    Current Medications: Current Meds  Medication Sig   atorvastatin  (LIPITOR) 80 MG tablet Take 1 tablet (80 mg total) by mouth daily.   cetirizine (ZYRTEC) 10 MG tablet Take 10 mg by mouth daily as needed for allergies.   clobetasol cream (TEMOVATE) 9.67 % Apply 1 application topically 2 (two) times daily. To external ear canals   metoprolol succinate (TOPROL-XL) 50 MG 24 hr tablet Take 1 1/2 tabs (75mg ) by mouth twice a day   warfarin (COUMADIN) 5 MG tablet Take 1.5 tablets by mouth daily or as directed by the Anticoagulation Clinic. (Patient taking differently: Take 7.5 mg by mouth daily at 4 PM. or as directed by the Anticoagulation Clinic.)   [DISCONTINUED] isosorbide mononitrate (IMDUR) 30 MG 24 hr tablet Take 0.5 tablets (15 mg total) by mouth daily.     Allergies:   Patient has no known allergies.   Social History   Socioeconomic History   Marital status: Single    Spouse name: Not on file   Number of children: 0   Years of education: Not on file   Highest education level: Not on file  Occupational History   Not on file  Tobacco Use   Smoking status: Never   Smokeless tobacco: Never  Vaping Use   Vaping Use: Former  Substance and Sexual Activity   Alcohol use: Yes    Comment: rare   Drug use: No   Sexual activity: Yes    Birth control/protection: Condom  Other Topics Concern   Not on file  Social History Narrative   Not on file   Social Determinants of Health   Financial Resource Strain: Not on file  Food Insecurity: Not on file  Transportation Needs: Not on file  Physical Activity: Not on file  Stress: Not on file  Social Connections: Not on file     Family History: The patient's family history includes Anxiety disorder in his brother and father; Depression in his brother; Diabetes in his father; Heart attack in his mother.  ROS:   Please see the history of present illness.     All other systems reviewed and are negative.  EKGs/Labs/Other Studies Reviewed:    The following studies were reviewed today:   EKG:  EKG is  ordered today.  EKG shows normal sinus rhythm, normal ECG.  Recent Labs: 09/03/2020: TSH 0.559 06/11/2021: Hemoglobin 16.5; Platelets 208 06/17/2021: ALT 59; BUN 8; Creatinine, Ser 0.65; Potassium 4.2; Sodium 144  Recent Lipid Panel    Component Value Date/Time   CHOL 170 09/03/2020 0829   TRIG 146 09/03/2020 0829   HDL 33 (L) 09/03/2020 0829   CHOLHDL 5.2 (H) 09/03/2020 0829   LDLCALC 111 (H) 09/03/2020 0829     Risk Assessment/Calculations:      Physical Exam:    VS:  BP 110/64 (BP Location: Left Arm, Patient Position: Sitting, Cuff Size: Normal)    Pulse 70    Ht 5\' 7"  (1.702 m)    Wt 196 lb 4 oz (89 kg)    SpO2 98%    BMI 30.74 kg/m     Wt Readings from Last 3 Encounters:  06/24/21 196 lb 4 oz (89 kg)  06/13/21 197 lb (89.4 kg)  06/11/21 197 lb (89.4 kg)     GEN:  Well nourished, well developed in no acute distress HEENT: Normal NECK: No JVD; No carotid bruits LYMPHATICS: No lymphadenopathy CARDIAC: RRR, 2/6 systolic murmur, mechanical click consistent with mechanical valve noted. RESPIRATORY:  Clear to auscultation without rales, wheezing or rhonchi  ABDOMEN: Soft, non-tender, non-distended MUSCULOSKELETAL:  No edema; No deformity  SKIN: Warm and dry NEUROLOGIC:  Alert and oriented x 3 PSYCHIATRIC:  Normal affect   ASSESSMENT:    1. Coronary artery disease involving native coronary artery of native heart, unspecified whether angina present   2. Thoracic aortic aneurysm without rupture, unspecified part   3. H/O mechanical aortic valve replacement   4. Pure hypercholesterolemia      PLAN:    In order of problems listed above:  CAD, LHC 05/2021 mid LAD 50%.  IFFR 0.95  CCTA 05/2021 severe mid LAD stenosis, calcium score 958.  FFRct did not show significant stenosis.  Still with chest pressure.  Increase Imdur to 30 mg daily. Cont Toprol-XL, Lipitor, warfarin.  Patient with history of aortic aneurysm, dissection, status post Bentall procedure with aortic root  replacement and mechanical aortic valve. CT angio aorta obtained 04/2021 showed normal aortic graft with no postop complications.  Continue monitoring with yearly CT scan for aortic root size and or any postop complications. Continue Toprol xl 75 mg twice daily. History of mechanical aortic valve replacement, keep appointments with Coumadin clinic for frequent INR checks.  Last echo with normal functioning aortic valve prosthesis. hyperlipidemia, cont Lipitor to 80 mg daily.  Repeat lipid panel in 3 months.  Follow-up in 3 months  Total encounter time 40 minutes  Greater than 50% was spent in counseling and coordination of care with the patient   Medication Adjustments/Labs and Tests Ordered: Current medicines are reviewed at length with the patient today.  Concerns regarding medicines are outlined above.  Orders Placed This Encounter  Procedures   Lipid panel   EKG 12-Lead   Meds ordered this encounter  Medications   isosorbide mononitrate (IMDUR) 30 MG 24 hr tablet    Sig: Take 0.5 tablets (15 mg total) by mouth daily.    Dispense:  90 tablet    Refill:  2    Dosage increase    Patient Instructions  Medication Instructions:  Your physician has recommended you make the following change in your medication:   INCREASE Imdur to 30 mg daily. An Rx has been sent to your pharmacy.  *If you need a refill on your cardiac medications before your next appointment, please call your pharmacy*   Lab Work: Your physician recommends that you return for a FASTING lipid profile: in 2 months  If you have labs (blood work) drawn today and your tests are completely normal, you will receive your results only by: [place] (if you have MyChart) OR A paper copy in the mail If you have any lab test that is abnormal or we need to change your treatment, we will call you to review the results.   Testing/Procedures: None ordered   Follow-Up: At [facility], you and your health needs  are our priority.  As part of our continuing mission to provide you with exceptional heart care, we have created designated Provider Care Teams.  These Care Teams include your primary Cardiologist (physician) and Advanced Practice Providers (APPs -  Physician Assistants and Nurse Practitioners) who all work together to provide you with the care you need, when you need it.  We recommend signing up for the patient portal called "MyChart".  Sign up information is provided on this After Visit Summary.  MyChart is used to connect with patients for Virtual Visits (Telemedicine).  Patients are able to view lab/test results, encounter notes, upcoming appointments, etc.  Non-urgent messages can be sent to your provider as well.   To learn more about what you can do with MyChart, go to [email or web address].    Your next appointment:   3 month(s)  The format for your next appointment:   In Person  Provider:   You may see Kate Sable, MD or one of the following Advanced Practice Providers on your designated Care Team:   Murray Hodgkins, NP Christell Faith, PA-C Cadence Kathlen Mody, [place]     Other Instructions N/A    Signed, Kate Sable, MD  06/24/2021 10:32 AM    [place]

## 2021-06-24 NOTE — Patient Instructions (Signed)
Medication Instructions:  Your physician has recommended you make the following change in your medication:   INCREASE Imdur to 30 mg daily. An Rx has been sent to your pharmacy.  *If you need a refill on your cardiac medications before your next appointment, please call your pharmacy*   Lab Work: Your physician recommends that you return for a FASTING lipid profile: in 2 months  If you have labs (blood work) drawn today and your tests are completely normal, you will receive your results only by: [place] (if you have MyChart) OR A paper copy in the mail If you have any lab test that is abnormal or we need to change your treatment, we will call you to review the results.   Testing/Procedures: None ordered   Follow-Up: At [facility], you and your health needs are our priority.  As part of our continuing mission to provide you with exceptional heart care, we have created designated Provider Care Teams.  These Care Teams include your primary Cardiologist (physician) and Advanced Practice Providers (APPs -  Physician Assistants and Nurse Practitioners) who all work together to provide you with the care you need, when you need it.  We recommend signing up for the patient portal called "MyChart".  Sign up information is provided on this After Visit Summary.  MyChart is used to connect with patients for Virtual Visits (Telemedicine).  Patients are able to view lab/test results, encounter notes, upcoming appointments, etc.  Non-urgent messages can be sent to your provider as well.   To learn more about what you can do with MyChart, go to [email or web address].    Your next appointment:   3 month(s)  The format for your next appointment:   In Person  Provider:   You may see Kate Sable, MD or one of the following Advanced Practice Providers on your designated Care Team:   Murray Hodgkins, NP Christell Faith, PA-C Cadence Kathlen Mody, PA-C{     Other Instructions N/A

## 2021-06-25 ENCOUNTER — Encounter: Payer: Self-pay | Admitting: Internal Medicine

## 2021-06-25 ENCOUNTER — Ambulatory Visit (INDEPENDENT_AMBULATORY_CARE_PROVIDER_SITE_OTHER): Payer: BC Managed Care – PPO | Admitting: Internal Medicine

## 2021-06-25 VITALS — BP 96/62 | HR 77 | Ht 67.0 in | Wt 196.0 lb

## 2021-06-25 DIAGNOSIS — F411 Generalized anxiety disorder: Secondary | ICD-10-CM | POA: Diagnosis not present

## 2021-06-25 DIAGNOSIS — L603 Nail dystrophy: Secondary | ICD-10-CM | POA: Insufficient documentation

## 2021-06-25 DIAGNOSIS — D485 Neoplasm of uncertain behavior of skin: Secondary | ICD-10-CM | POA: Insufficient documentation

## 2021-06-25 MED ORDER — BUSPIRONE HCL 5 MG PO TABS: 5.0000 mg | ORAL_TABLET | Freq: Two times a day (BID) | ORAL | 0 refills | Status: DC

## 2021-06-25 NOTE — Progress Notes (Signed)
Date:  06/25/2021   Name:  Henry Duncan   DOB:  1990-06-24   MRN:  [id number]   Chief Complaint: Anxiety (Started around 04/2021; chest pain associated; following with cardiology)  Anxiety Presents for follow-up visit. Symptoms include chest pain, excessive worry, insomnia, irritability, nervous/anxious behavior and restlessness. Patient reports no dizziness, panic, shortness of breath or suicidal ideas. Symptoms occur most days. The severity of symptoms is moderate. The quality of sleep is good.     Lab Results  Component Value Date   NA 144 06/17/2021   K 4.2 06/17/2021   CO2 24 06/17/2021   GLUCOSE 82 06/17/2021   BUN 8 06/17/2021   CREATININE 0.65 (L) 06/17/2021   CALCIUM 9.3 06/17/2021   EGFR 131 06/17/2021   GFRNONAA >60 05/23/2021   Lab Results  Component Value Date   CHOL 170 09/03/2020   HDL 33 (L) 09/03/2020   LDLCALC 111 (H) 09/03/2020   TRIG 146 09/03/2020   CHOLHDL 5.2 (H) 09/03/2020   Lab Results  Component Value Date   TSH 0.559 09/03/2020   Lab Results  Component Value Date   HGBA1C 5.5 06/17/2021   Lab Results  Component Value Date   WBC 10.0 06/11/2021   HGB 16.5 06/11/2021   HCT 47.8 06/11/2021   MCV 91 06/11/2021   PLT 208 06/11/2021   Lab Results  Component Value Date   ALT 59 (H) 06/17/2021   AST 30 06/17/2021   ALKPHOS 100 06/17/2021   BILITOT 0.3 06/17/2021   No results found for: 25OHVITD2, 25OHVITD3, VD25OH   Review of Systems  Constitutional:  Positive for irritability. Negative for chills, fatigue and fever.  Respiratory:  Negative for chest tightness and shortness of breath.   Cardiovascular:  Positive for chest pain. Negative for leg swelling.  Neurological:  Negative for dizziness and headaches.  Psychiatric/Behavioral:  Negative for dysphoric mood, sleep disturbance and suicidal ideas. The patient is nervous/anxious and has insomnia.    Patient Active Problem List   Diagnosis Date Noted   Onychodystrophy  06/25/2021   Neoplasm of uncertain behavior of skin 06/25/2021   Abnormal cardiac CT angiography    Coronary artery disease involving native coronary artery of native heart    Warfarin anticoagulation 03/15/2019   Encounter for therapeutic drug monitoring 09/29/2018   Aortic valve prosthesis present 09/29/2018   S/P ascending aortic aneurysm repair 09/19/2018   Allergic rhinitis 09/06/2018   Hepatic steatosis 10/30/2016   Insomnia 11/29/2015   Migraine 11/29/2015    No Known Allergies  Past Surgical History:  Procedure Laterality Date   BENTALL PROCEDURE N/A 09/19/2018   Procedure: BENTALL PROCEDURE WITH [place].;  Surgeon: Gaye Pollack, MD;  Location: [facility];  Service: Open Heart Surgery;  Laterality: N/A;   CARDIAC SURGERY     CLEFT LIP REPAIR     CORONARY ANGIOGRAPHY Left 06/13/2021   Procedure: CORONARY ANGIOGRAPHY (CATH LAB);  Surgeon: Wellington Hampshire, MD;  Location: [facility];  Service: Cardiovascular;  Laterality: Left;   CORONARY PRESSURE WIRE/FFR WITH 3D MAPPING N/A 06/13/2021   Procedure: Coronary Pressure Wire, IFR;  Surgeon: Wellington Hampshire, MD;  Location: [facility];  Service: Cardiovascular;  Laterality: N/A;   SUBXYPHOID PERICARDIAL WINDOW N/A 09/26/2018   Procedure: SUBXYPHOID PERICARDIAL WINDOW;  Surgeon: Gaye Pollack, MD;  [facility];  Service: Thoracic;  Laterality: N/A;   TEE WITHOUT CARDIOVERSION N/A 09/19/2018   Procedure: TRANSESOPHAGEAL ECHOCARDIOGRAM (TEE);  Surgeon: Gaye Pollack, MD;  Location: [facility];  Service: Open Heart Surgery;  Laterality: N/A;    Social History   Tobacco Use   Smoking status: Never   Smokeless tobacco: Never  Vaping Use   Vaping Use: Former  Substance Use Topics   Alcohol use: Yes   Drug use: Never     Medication list has been reviewed and updated.  Current Meds  Medication Sig   atorvastatin (LIPITOR) 80 MG tablet Take 1 tablet (80 mg total) by mouth daily.   cetirizine  (ZYRTEC) 10 MG tablet Take 10 mg by mouth daily as needed for allergies.   clobetasol cream (TEMOVATE) 8.84 % Apply 1 application topically 2 (two) times daily. To external ear canals   isosorbide mononitrate (IMDUR) 30 MG 24 hr tablet Take 0.5 tablets (15 mg total) by mouth daily. (Patient taking differently: Take 30 mg by mouth daily.)   metoprolol succinate (TOPROL-XL) 50 MG 24 hr tablet Take 1 1/2 tabs (9m) by mouth twice a day   warfarin (COUMADIN) 5 MG tablet Take 1.5 tablets by mouth daily or as directed by the Anticoagulation Clinic. (Patient taking differently: Take 7.5 mg by mouth daily at 4 PM. or as directed by the Anticoagulation Clinic.)    [facility] 06/25/2021 09/03/2020 09/01/2019 03/15/2019  PHQ - 2 Score 2 0 0 0  PHQ- 9 Score 5 0 0 -    GAD 7 : Generalized Anxiety Score 06/25/2021 09/03/2020 09/01/2019  Nervous, Anxious, on Edge 1 0 0  Control/stop worrying 2 0 0  Worry too much - different things 1 0 0  Trouble relaxing 0 0 0  Restless 1 0 0  Easily annoyed or irritable 2 0 0  Afraid - awful might happen 2 0 0  Total GAD 7 Score 9 0 0  Anxiety Difficulty Not difficult at all - Not difficult at all    BP Readings from Last 3 Encounters:  06/25/21 (!) 96/92  06/24/21 110/64  06/13/21 112/84    Physical Exam Constitutional:      Appearance: Normal appearance.  Cardiovascular:     Rate and Rhythm: Normal rate and regular rhythm.     Heart sounds: Murmur (mechanical valve click throughout the precordium) heard.  Pulmonary:     Effort: Pulmonary effort is normal.     Breath sounds: Normal breath sounds.  Musculoskeletal:     Cervical back: Normal range of motion.  Lymphadenopathy:     Cervical: No cervical adenopathy.  Neurological:     Mental Status: He is alert.  Psychiatric:        Attention and Perception: Attention normal.        Mood and Affect: Mood normal.        Speech: Speech normal.        Behavior: Behavior normal.    Wt Readings from Last 3  Encounters:  06/25/21 196 lb (88.9 kg)  06/24/21 196 lb 4 oz (89 kg)  06/13/21 197 lb (89.4 kg)    BP (!) 96/92 (BP Location: Right Arm, Patient Position: Sitting, Cuff Size: Normal)    Pulse 77    Ht _0  (1.702 m)    Wt 196 lb (88.9 kg)    SpO2 97%    BMI 30.70 kg/m   Assessment and Plan: 1. Generalized anxiety disorder Has tried sertraline, Celexa and Effexor in the past without benefit. Bupropion interacts with warfarin so will try Buspar. Follow up in one month. - busPIRone (BUSPAR)  5 MG tablet; Take 1 tablet (5 mg total) by mouth 2 (two) times daily.  Dispense: 60 tablet; Refill: 0   Partially dictated using Editor, commissioning. Any errors are unintentional.  Halina Maidens, MD [facility]  06/25/2021

## 2021-06-26 ENCOUNTER — Ambulatory Visit: Payer: BC Managed Care – PPO | Admitting: Podiatry

## 2021-06-26 ENCOUNTER — Telehealth: Payer: Self-pay | Admitting: Cardiology

## 2021-06-26 DIAGNOSIS — I2510 Atherosclerotic heart disease of native coronary artery without angina pectoris: Secondary | ICD-10-CM

## 2021-06-26 MED ORDER — ISOSORBIDE MONONITRATE ER 30 MG PO TB24: 30.0000 mg | ORAL_TABLET | Freq: Every day | ORAL | 5 refills | Status: DC

## 2021-06-26 NOTE — Telephone Encounter (Signed)
Called pharmacy and clarified the [facility] prescription. It should be 30 MG once a day.  New order sent in.

## 2021-06-26 NOTE — Telephone Encounter (Signed)
Please call pharmacy to discuss doseage increase. Directions need to be clarified.

## 2021-07-02 ENCOUNTER — Ambulatory Visit: Payer: BC Managed Care – PPO | Admitting: Nurse Practitioner

## 2021-07-03 DIAGNOSIS — G4733 Obstructive sleep apnea (adult) (pediatric): Secondary | ICD-10-CM | POA: Diagnosis not present

## 2021-07-11 ENCOUNTER — Other Ambulatory Visit: Payer: Self-pay | Admitting: *Deleted

## 2021-07-11 DIAGNOSIS — Z5181 Encounter for therapeutic drug level monitoring: Secondary | ICD-10-CM

## 2021-07-11 DIAGNOSIS — Z7901 Long term (current) use of anticoagulants: Secondary | ICD-10-CM

## 2021-07-11 MED ORDER — WARFARIN SODIUM 5 MG PO TABS: ORAL_TABLET | ORAL | 1 refills | Status: DC

## 2021-07-14 ENCOUNTER — Ambulatory Visit: Payer: BC Managed Care – PPO | Admitting: Cardiology

## 2021-07-23 ENCOUNTER — Ambulatory Visit: Payer: BC Managed Care – PPO | Admitting: Internal Medicine

## 2021-07-26 ENCOUNTER — Other Ambulatory Visit: Payer: Self-pay | Admitting: Internal Medicine

## 2021-07-26 DIAGNOSIS — F411 Generalized anxiety disorder: Secondary | ICD-10-CM

## 2021-07-27 ENCOUNTER — Other Ambulatory Visit: Payer: Self-pay | Admitting: Internal Medicine

## 2021-07-27 DIAGNOSIS — F411 Generalized anxiety disorder: Secondary | ICD-10-CM

## 2021-07-27 MED ORDER — BUSPIRONE HCL 5 MG PO TABS: 5.0000 mg | ORAL_TABLET | Freq: Two times a day (BID) | ORAL | 3 refills | Status: DC

## 2021-07-28 NOTE — Telephone Encounter (Signed)
Refilled 07/27/2021 - receipt confirmed by pharmacy. ?Requested Prescriptions  ?Pending Prescriptions Disp Refills  ?? busPIRone (BUSPAR) 5 MG tablet [Pharmacy Med Name: BUSPIRONE 5 MG TAB[*]] 60 tablet 0  ?  Sig: TAKE ONE TABLET BY MOUTH TWICE A DAY  ?  ? Psychiatry: Anxiolytics/Hypnotics - Non-controlled Passed - 07/26/2021 10:03 AM  ?  ?  Passed - Valid encounter within last 12 months  ?  Recent Outpatient Visits   ?      ? 1 month ago Generalized anxiety disorder  ? [facility] Glean Hess, MD  ? 10 months ago Annual physical exam  ? [facility] Glean Hess, MD  ? 1 year ago Annual physical exam  ? [facility] Glean Hess, MD  ? 2 years ago Disordered sleep  ? [facility]  ? 2 years ago Snoring  ? [place] [place], [place]  ?  ?  ?Future Appointments   ?        ? In 1 month [place] Melia Jesse Sans, MD [facility], [place]  ? In 1 month [place], Aaron Edelman, MD [facility]  ?  ? ?  ?  ?  ? ?

## 2021-07-30 ENCOUNTER — Ambulatory Visit (INDEPENDENT_AMBULATORY_CARE_PROVIDER_SITE_OTHER): Payer: BC Managed Care – PPO

## 2021-07-30 ENCOUNTER — Other Ambulatory Visit: Payer: Self-pay

## 2021-07-30 DIAGNOSIS — Z952 Presence of prosthetic heart valve: Secondary | ICD-10-CM | POA: Diagnosis not present

## 2021-07-30 DIAGNOSIS — Z5181 Encounter for therapeutic drug level monitoring: Secondary | ICD-10-CM | POA: Diagnosis not present

## 2021-07-30 LAB — POCT INR: INR: 1.8 — AB (ref 2.0–3.0)

## 2021-07-30 NOTE — Patient Instructions (Signed)
-   take 2 tablets warfarin tonight & tomorrow, then ?- resume warfarin dosage of 1.5 tablets every day ?- recheck INR in 6 weeks ?

## 2021-09-09 ENCOUNTER — Encounter: Payer: BC Managed Care – PPO | Admitting: Internal Medicine

## 2021-09-09 DIAGNOSIS — E782 Mixed hyperlipidemia: Secondary | ICD-10-CM | POA: Insufficient documentation

## 2021-09-09 NOTE — Progress Notes (Deleted)
? ? ?Date:  09/09/2021  ? ?Name:  Henry Duncan   DOB:  05/14/1991   MRN:  [id number] ? ? ?Chief Complaint: No chief complaint on file. ?Henry Duncan is a 31 y.o. male who presents today for his Complete Annual Exam. He feels {DESC; WELL/FAIRLY WELL/POORLY:18703}. He reports exercising ***. He reports he is sleeping {DESC; WELL/FAIRLY WELL/POORLY:18703}.  ? ?Colonoscopy: not due ? ?Immunization History  ?Administered Date(s) Administered  ? Hepatitis B, adult 09/02/2015, 11/06/2015, 03/03/2016  ? Tdap 09/02/2011  ? ?Health Maintenance Due  ?Topic Date Due  ? COVID-19 Vaccine (1) Never done  ? HIV Screening  Never done  ? TETANUS/TDAP  09/01/2021  ?  ?No results found for: PSA1, PSA ? ? ?Anxiety ?Presents for follow-up visit. Patient reports no chest pain, dizziness, nervous/anxious behavior, palpitations or shortness of breath.  ? ?Compliance with medications: started Buspar last month.  ? ?Lab Results  ?Component Value Date  ? NA 144 06/17/2021  ? K 4.2 06/17/2021  ? CO2 24 06/17/2021  ? GLUCOSE 82 06/17/2021  ? BUN 8 06/17/2021  ? CREATININE 0.65 (L) 06/17/2021  ? CALCIUM 9.3 06/17/2021  ? EGFR 131 06/17/2021  ? GFRNONAA >60 05/23/2021  ? ?Lab Results  ?Component Value Date  ? CHOL 170 09/03/2020  ? HDL 33 (L) 09/03/2020  ? LDLCALC 111 (H) 09/03/2020  ? TRIG 146 09/03/2020  ? CHOLHDL 5.2 (H) 09/03/2020  ? ?Lab Results  ?Component Value Date  ? TSH 0.559 09/03/2020  ? ?Lab Results  ?Component Value Date  ? HGBA1C 5.5 06/17/2021  ? ?Lab Results  ?Component Value Date  ? WBC 10.0 06/11/2021  ? HGB 16.5 06/11/2021  ? HCT 47.8 06/11/2021  ? MCV 91 06/11/2021  ? PLT 208 06/11/2021  ? ?Lab Results  ?Component Value Date  ? ALT 59 (H) 06/17/2021  ? AST 30 06/17/2021  ? ALKPHOS 100 06/17/2021  ? BILITOT 0.3 06/17/2021  ? ?No results found for: 25OHVITD2, [place], VD25OH  ? ?Review of Systems  ?Constitutional:  Negative for appetite change, chills, diaphoresis, fatigue and unexpected weight change.  ?HENT:   Negative for hearing loss, tinnitus, trouble swallowing and voice change.   ?Eyes:  Negative for visual disturbance.  ?Respiratory:  Negative for choking, shortness of breath and wheezing.   ?Cardiovascular:  Negative for chest pain, palpitations and leg swelling.  ?Gastrointestinal:  Negative for abdominal pain, blood in stool, constipation and diarrhea.  ?Genitourinary:  Negative for difficulty urinating, dysuria and frequency.  ?Musculoskeletal:  Negative for arthralgias, back pain and myalgias.  ?Skin:  Negative for color change and rash.  ?Neurological:  Negative for dizziness, syncope and headaches.  ?Hematological:  Negative for adenopathy.  ?Psychiatric/Behavioral:  Negative for dysphoric mood and sleep disturbance. The patient is not nervous/anxious.   ? ?Patient Active Problem List  ? Diagnosis Date Noted  ? Onychodystrophy 06/25/2021  ? Neoplasm of uncertain behavior of skin 06/25/2021  ? Generalized anxiety disorder 06/25/2021  ? Abnormal cardiac CT angiography   ? Coronary artery disease involving native coronary artery of native heart   ? Warfarin anticoagulation 03/15/2019  ? Encounter for therapeutic drug monitoring 09/29/2018  ? Aortic valve prosthesis present 09/29/2018  ? S/P ascending aortic aneurysm repair 09/19/2018  ? Allergic rhinitis 09/06/2018  ? Hepatic steatosis 10/30/2016  ? Insomnia 11/29/2015  ? Migraine 11/29/2015  ? ? ?No Known Allergies ? ?Past Surgical History:  ?Procedure Laterality Date  ? BENTALL PROCEDURE N/A 09/19/2018  ? Procedure: BENTALL PROCEDURE WITH  [place].;  Surgeon: Gaye Pollack, MD;  [facility];  Service: Open Heart Surgery;  Laterality: N/A;  ? CARDIAC SURGERY    ? CLEFT LIP REPAIR    ? CORONARY ANGIOGRAPHY Left 06/13/2021  ? Procedure: CORONARY ANGIOGRAPHY (CATH LAB);  Surgeon: Wellington Hampshire, MD;  Location: [place] [facility];  Service: Cardiovascular;  Laterality: Left;  ? CORONARY PRESSURE WIRE/FFR WITH 3D MAPPING N/A 06/13/2021  ?  Procedure: Coronary Pressure Wire, IFR;  Surgeon: Wellington Hampshire, MD;  Location: [facility];  Service: Cardiovascular;  Laterality: N/A;  ? SUBXYPHOID PERICARDIAL WINDOW N/A 09/26/2018  ? Procedure: SUBXYPHOID PERICARDIAL WINDOW;  Surgeon: Gaye Pollack, MD;  Location: [facility] [facility];  Service: Thoracic;  Laterality: N/A;  ? TEE WITHOUT CARDIOVERSION N/A 09/19/2018  ? Procedure: TRANSESOPHAGEAL ECHOCARDIOGRAM (TEE);  Surgeon: Gaye Pollack, MD;  Location: [facility];  Service: Open Heart Surgery;  Laterality: N/A;  ? ? ?Social History  ? ?Tobacco Use  ? Smoking status: Never  ? Smokeless tobacco: Never  ?Vaping Use  ? Vaping Use: Former  ?Substance Use Topics  ? Alcohol use: Yes  ? Drug use: Never  ? ? ? ?Medication list has been reviewed and updated. ? ?No outpatient medications have been marked as taking for the 09/09/21 encounter (Appointment) with Glean Hess, MD.  ? ? ? ?  06/25/2021  ?  3:56 PM 09/03/2020  ?  7:55 AM 09/01/2019  ?  8:22 AM  ?GAD 7 : Generalized Anxiety Score  ?Nervous, Anxious, on Edge 1 0 0  ?Control/stop worrying 2 0 0  ?Worry too much - different things 1 0 0  ?Trouble relaxing 0 0 0  ?Restless 1 0 0  ?Easily annoyed or irritable 2 0 0  ?Afraid - awful might happen 2 0 0  ?Total GAD 7 Score 9 0 0  ?Anxiety Difficulty Not difficult at all  Not difficult at all  ? ? ? ?  06/25/2021  ?  3:57 PM  ?Depression screen PHQ 2/9  ?Decreased Interest 1  ?Down, Depressed, Hopeless 1  ?PHQ - 2 Score 2  ?Altered sleeping 1  ?Tired, decreased energy 1  ?Change in appetite 1  ?Feeling bad or failure about yourself  0  ?Trouble concentrating 0  ?Moving slowly or fidgety/restless 0  ?Suicidal thoughts 0  ?PHQ-9 Score 5  ?Difficult doing work/chores Not difficult at all  ? ? ?BP Readings from Last 3 Encounters:  ?06/25/21 96/62  ?06/24/21 110/64  ?06/13/21 112/84  ? ? ?Physical Exam ?Vitals and nursing note reviewed.  ?Constitutional:   ?   Appearance: Normal appearance. He is well-developed.  ?HENT:  ?   Head:  Normocephalic.  ?   Right Ear: Tympanic membrane, ear canal and external ear normal.  ?   Left Ear: Tympanic membrane, ear canal and external ear normal.  ?   Nose: Nose normal.  ?Eyes:  ?   Conjunctiva/sclera: Conjunctivae normal.  ?   Pupils: Pupils are equal, round, and reactive to light.  ?Neck:  ?   Thyroid: No thyromegaly.  ?   Vascular: No carotid bruit.  ?Cardiovascular:  ?   Rate and Rhythm: Normal rate and regular rhythm.  ?   Heart sounds: Normal heart sounds.  ?Pulmonary:  ?   Effort: Pulmonary effort is normal.  ?   Breath sounds: Normal breath sounds. No wheezing.  ?Chest:  ?Breasts: ?   Right: No mass.  ?  Left: No mass.  ?Abdominal:  ?   General: Bowel sounds are normal.  ?   Palpations: Abdomen is soft.  ?   Tenderness: There is no abdominal tenderness.  ?Musculoskeletal:     ?   General: Normal range of motion.  ?   Cervical back: Normal range of motion and neck supple.  ?Lymphadenopathy:  ?   Cervical: No cervical adenopathy.  ?Skin: ?   General: Skin is warm and dry.  ?Neurological:  ?   Mental Status: He is alert and oriented to person, place, and time.  ?   Deep Tendon Reflexes: Reflexes are normal and symmetric.  ?Psychiatric:     ?   Attention and Perception: Attention normal.     ?   Mood and Affect: Mood normal.     ?   Thought Content: Thought content normal.  ? ? ?Wt Readings from Last 3 Encounters:  ?06/25/21 196 lb (88.9 kg)  ?06/24/21 196 lb 4 oz (89 kg)  ?06/13/21 197 lb (89.4 kg)  ? ? ?There were no vitals taken for this visit. ? ?Assessment and Plan: ? ? ? ? ?

## 2021-09-10 ENCOUNTER — Ambulatory Visit (INDEPENDENT_AMBULATORY_CARE_PROVIDER_SITE_OTHER): Payer: BC Managed Care – PPO

## 2021-09-10 DIAGNOSIS — Z5181 Encounter for therapeutic drug level monitoring: Secondary | ICD-10-CM

## 2021-09-10 DIAGNOSIS — Z952 Presence of prosthetic heart valve: Secondary | ICD-10-CM

## 2021-09-10 LAB — POCT INR: INR: 2.9 (ref 2.0–3.0)

## 2021-09-10 NOTE — Patient Instructions (Signed)
-   resume warfarin dosage of 1.5 tablets every day ?- recheck INR in 6 weeks ?

## 2021-09-16 ENCOUNTER — Other Ambulatory Visit (INDEPENDENT_AMBULATORY_CARE_PROVIDER_SITE_OTHER): Payer: BC Managed Care – PPO

## 2021-09-16 DIAGNOSIS — I2510 Atherosclerotic heart disease of native coronary artery without angina pectoris: Secondary | ICD-10-CM

## 2021-09-16 DIAGNOSIS — E7800 Pure hypercholesterolemia, unspecified: Secondary | ICD-10-CM

## 2021-09-17 ENCOUNTER — Telehealth: Payer: Self-pay

## 2021-09-17 LAB — LIPID PANEL
Chol/HDL Ratio: 4.5 ratio (ref 0.0–5.0)
Cholesterol, Total: 174 mg/dL (ref 100–199)
HDL: 39 mg/dL — ABNORMAL LOW (ref >39)
LDL Chol Calc (NIH): 105 mg/dL — ABNORMAL HIGH (ref 0–99)
Triglycerides: 169 mg/dL — ABNORMAL HIGH (ref 0–149)
VLDL Cholesterol Cal: 30 mg/dL (ref 5–40)

## 2021-09-17 MED ORDER — FENOFIBRATE 145 MG PO TABS: 145.0000 mg | ORAL_TABLET | Freq: Every day | ORAL | 5 refills | Status: DC

## 2021-09-17 NOTE — Telephone Encounter (Signed)
The patient has been notified of the result and verbalized understanding.  All questions (if any) were answered. Kavin Leech, RN 09/17/2021 4:42 PM  ? ?

## 2021-09-17 NOTE — Telephone Encounter (Signed)
-----   Message from Kate Sable, MD sent at 09/17/2021  2:16 PM EDT ----- ?Triglycerides elevated, start fenofibrate. ?

## 2021-09-18 ENCOUNTER — Encounter: Payer: Self-pay | Admitting: Cardiology

## 2021-09-19 ENCOUNTER — Other Ambulatory Visit: Payer: BC Managed Care – PPO

## 2021-09-22 ENCOUNTER — Encounter: Payer: Self-pay | Admitting: Cardiology

## 2021-09-22 ENCOUNTER — Ambulatory Visit (INDEPENDENT_AMBULATORY_CARE_PROVIDER_SITE_OTHER): Payer: BC Managed Care – PPO | Admitting: Cardiology

## 2021-09-22 VITALS — BP 110/70 | HR 70 | Ht 67.0 in | Wt 195.0 lb

## 2021-09-22 DIAGNOSIS — E782 Mixed hyperlipidemia: Secondary | ICD-10-CM

## 2021-09-22 DIAGNOSIS — Z952 Presence of prosthetic heart valve: Secondary | ICD-10-CM

## 2021-09-22 DIAGNOSIS — I2510 Atherosclerotic heart disease of native coronary artery without angina pectoris: Secondary | ICD-10-CM | POA: Diagnosis not present

## 2021-09-22 DIAGNOSIS — Z9889 Other specified postprocedural states: Secondary | ICD-10-CM

## 2021-09-22 DIAGNOSIS — Z8679 Personal history of other diseases of the circulatory system: Secondary | ICD-10-CM

## 2021-09-22 NOTE — Patient Instructions (Signed)
Medication Instructions:  ?No changes at this time. ? ?*If you need a refill on your cardiac medications before your next appointment, please call your pharmacy* ? ? ?Lab Work: ?Lipid panel in 3 months (Around August 1st). Go to [facility] entrance and check in at registration and they will direct you where to go.  ? ?If you have labs (blood work) drawn today and your tests are completely normal, you will receive your results only by: ?MyChart Message (if you have MyChart) OR ?A paper copy in the mail ?If you have any lab test that is abnormal or we need to change your treatment, we will call you to review the results. ? ? ?Testing/Procedures: ?None ? ? ?Follow-Up: ?At [facility], you and your health needs are our priority.  As part of our continuing mission to provide you with exceptional heart care, we have created designated Provider Care Teams.  These Care Teams include your primary Cardiologist (physician) and Advanced Practice Providers (APPs -  Physician Assistants and Nurse Practitioners) who all work together to provide you with the care you need, when you need it. ? ? ?Your next appointment:   ?6 month(s) ? ?The format for your next appointment:   ?In Person ? ?Provider:   ?Kate Sable, MD  ? ? ? ? ? ? ?mation About Sugar ? ? ? ? ?  ?

## 2021-09-22 NOTE — Progress Notes (Signed)
?Cardiology Office Note:   ? ?Date:  09/22/2021  ? ?ID:  Henry Duncan, DOB 04/07/1991, MRN [id number] ? ?PCP:  Glean Hess, MD  ?[facility] Cardiologist:  Kate Sable, MD  ?[facility] Electrophysiologist:  None  ? ?Referring MD: Glean Hess, MD  ? ?Chief Complaint  ?Patient presents with  ? Other  ?  3 month follow up -- Meds reviewed verbally with patient.   ? ? ?History of Present Illness:   ? ?Henry Duncan is a 31 y.o. male with a hx of hypertension, CAD (lhc 05/2021-50% mid LAD), aortic aneurysm, bicuspid aortic valve, ascending thoracic aorta dissection s/p Bentall procedure (with 23 mm [employer] mechanical valve, developed pericardial effusion postoperatively and underwent pericardial window 08/2018), hyperlipidemia who presents for follow-up. ? ?Being seen for nonobstructive CAD, hyperlipidemia, aortic valve and root replacement.  Previously had chest discomfort, started on Imdur with improvement in symptoms.  Able to perform activities of daily living without much restriction.  Last lipid panel obtained revealed elevated triglycerides and LDL.  Tolerating max dose of Lipitor 80 mg daily.  Fenofibrate was called in, he states tolerating dose without any adverse effects. ? ? ?Prior notes ?LHC 05/2021 mid LAD 50%.  IFFR 0.95   ?CCTA 05/2021 severe mid LAD stenosis, calcium score 958.  FFRct did not show significant stenosis ?TTE 03/2020 EF 55 to 60%, normal functioning prosthetic valve. ? ?Patient presented to the hospital on 09/13/2018 due to chest pain.  CT did reveal ascending thoracic aorta aneurysm measuring 5.7 to 5.8 cm, localized partial dissection of the anterior middle ascending thoracic aorta.  He was subsequently taken for surgery and underwent Bentall procedure. ? ?Last transthoracic echocardiogram 09/16/2018 showed normal systolic and diastolic function, EF 60 to 65%.  Intraoperative TEE on 09/19/2018 showed normal bioprosthetic valve with normal washing jets,  transaortic gradient 7 mmHg. ? ?Coronary CTA 2020, calcium score 476, moderate mid LAD disease ? ? ?Past Medical History:  ?Diagnosis Date  ? Allergy   ? Anxiety   ? Aortic aneurysm ([place]) 09/16/2018  ? a. 08/2018 Asc Ao/root aneuryms w/ dissection-->s/p Bentall w/ 58m Hemashield graft, 226mSJM AVR, and reimplantation of cor arteries.  ? CAD (coronary artery disease)   ? a. 08/2018 Cor CTA: Ca2+ 99th%'ile (476). Calcified plaque in mLAD. Nl LM, LCX, RCA. S/p reimplantation of coronary arteries in setting of Bentall.  ? H/O mechanical aortic valve replacement   ? a. 08/2018 in setting of bicuspid AoV and Asc Ao dissection-->2390mJM mech AVR; b. 03/2020 Echo: EF 55-60%. No rwma. Mild LVH. Nl RV fxn. Nl functioning AVR.  ? Hyperlipidemia   ? Hypertension   ? Pericardial effusion   ? a. 08/2018 post-op -->s/p window.  ? ? ?Past Surgical History:  ?Procedure Laterality Date  ? BENTALL PROCEDURE N/A 09/19/2018  ? Procedure: BENTALL PROCEDURE WITH 23 [place] Surgeon: BarGaye PollackD;  [facility];  Service: Open Heart Surgery;  Laterality: N/A;  ? CARDIAC SURGERY    ? CLEFT LIP REPAIR    ? CORONARY ANGIOGRAPHY Left 06/13/2021  ? Procedure: CORONARY ANGIOGRAPHY (CATH LAB);  Surgeon: AriWellington HampshireD;  [place] [facility];  Service: Cardiovascular;  Laterality: Left;  ? CORONARY PRESSURE WIRE/FFR WITH 3D MAPPING N/A 06/13/2021  ? Procedure: Coronary Pressure Wire, IFR;  Surgeon: AriWellington HampshireD;  [facility];  Service: Cardiovascular;  Laterality: N/A;  ? SUBXYPHOID PERICARDIAL WINDOW N/A 09/26/2018  ?  Procedure: SUBXYPHOID PERICARDIAL WINDOW;  Surgeon: Gaye Pollack, MD;  Location: [place] OR;  Service: Thoracic;  Laterality: N/A;  ? TEE WITHOUT CARDIOVERSION N/A 09/19/2018  ? Procedure: TRANSESOPHAGEAL ECHOCARDIOGRAM (TEE);  Surgeon: Gaye Pollack, MD;  Location: [place];  Service: Open Heart Surgery;  Laterality: N/A;  ? ? ?Current Medications: ?Current Meds   ?Medication Sig  ? atorvastatin (LIPITOR) 80 MG tablet Take 1 tablet (80 mg total) by mouth daily.  ? busPIRone (BUSPAR) 5 MG tablet Take 1 tablet (5 mg total) by mouth 2 (two) times daily.  ? cetirizine (ZYRTEC) 10 MG tablet Take 10 mg by mouth daily as needed for allergies.  ? clobetasol cream (TEMOVATE) 6.06 % Apply 1 application topically 2 (two) times daily. To external ear canals  ? fenofibrate (TRICOR) 145 MG tablet Take 1 tablet (145 mg total) by mouth daily.  ? isosorbide mononitrate (IMDUR) 30 MG 24 hr tablet Take 1 tablet (30 mg total) by mouth daily.  ? metoprolol succinate (TOPROL-XL) 50 MG 24 hr tablet Take 1 1/2 tabs ('75mg'$ ) by mouth twice a day  ? warfarin (COUMADIN) 5 MG tablet Take 1.5 tablets by mouth daily or as directed by the Anticoagulation Clinic.  ?  ? ?Allergies:   Patient has no known allergies.  ? ?Social History  ? ?Socioeconomic History  ? Marital status: Single  ?  Spouse name: Not on file  ? Number of children: 0  ? Years of education: Not on file  ? Highest education level: Not on file  ?Occupational History  ? Not on file  ?Tobacco Use  ? Smoking status: Never  ? Smokeless tobacco: Never  ?Vaping Use  ? Vaping Use: Former  ?Substance and Sexual Activity  ? Alcohol use: Yes  ? Drug use: Never  ? Sexual activity: Yes  ?  Partners: Female  ?  Birth control/protection: Condom  ?Other Topics Concern  ? Not on file  ?Social History Narrative  ? Not on file  ? ?Social Determinants of Health  ? ?Financial Resource Strain: Not on file  ?Food Insecurity: Not on file  ?Transportation Needs: Not on file  ?Physical Activity: Not on file  ?Stress: Not on file  ?Social Connections: Not on file  ?  ? ?Family History: ?The patient's family history includes Anxiety disorder in his brother and father; Depression in his brother; Diabetes in his father; Heart attack in his mother. ? ?ROS:   ?Please see the history of present illness.    ? All other systems reviewed and are negative. ? ?EKGs/Labs/Other  Studies Reviewed:   ? ?The following studies were reviewed today: ? ? ?EKG:  EKG is ordered today.  EKG shows normal sinus rhythm, normal ECG. ? ?Recent Labs: ?06/11/2021: Hemoglobin 16.5; Platelets 208 ?06/17/2021: ALT 59; BUN 8; Creatinine, Ser 0.65; Potassium 4.2; Sodium 144  ?Recent Lipid Panel ?   ?Component Value Date/Time  ? CHOL 174 09/16/2021 0813  ? TRIG 169 (H) 09/16/2021 0813  ? HDL 39 (L) 09/16/2021 0813  ? CHOLHDL 4.5 09/16/2021 0813  ? [place] 105 (H) 09/16/2021 0813  ? ? ? ?Risk Assessment/Calculations:   ? ? ? ?Physical Exam:   ? ?VS:  BP 110/70 (BP Location: Left Arm, Patient Position: Sitting, Cuff Size: Normal)   Pulse 70   Ht '5\' 7"'$  (1.702 m)   Wt 195 lb (88.5 kg)   SpO2 97%   BMI 30.54 kg/m?    ? ?Wt Readings from Last 3 Encounters:  ?09/22/21 195  lb (88.5 kg)  ?06/25/21 196 lb (88.9 kg)  ?06/24/21 196 lb 4 oz (89 kg)  ?  ? ?GEN:  Well nourished, well developed in no acute distress ?HEENT: Normal ?NECK: No JVD; No carotid bruits ?LYMPHATICS: No lymphadenopathy ?CARDIAC: RRR, 2/6 systolic murmur, mechanical click consistent with mechanical valve noted. ?RESPIRATORY:  Clear to auscultation without rales, wheezing or rhonchi  ?ABDOMEN: Soft, non-tender, non-distended ?MUSCULOSKELETAL:  No edema; No deformity  ?SKIN: Warm and dry ?NEUROLOGIC:  Alert and oriented x 3 ?PSYCHIATRIC:  Normal affect  ? ?ASSESSMENT:   ? ?1. Coronary artery disease involving native coronary artery of native heart, unspecified whether angina present   ?2. S/P ascending aortic aneurysm repair   ?3. H/O mechanical aortic valve replacement   ?4. Mixed hyperlipidemia   ? ? ?PLAN:   ? ?In order of problems listed above: ? ?CAD, LHC 05/2021 mid LAD 50%.  IFFR 0.95.  Still with chest pressure.  Increase Imdur to 30 mg daily. Cont Toprol-XL, Lipitor, warfarin.  ?aortic root aneurysm, dissection, s/p Bentall procedure with aortic root replacement and mechanical aortic valve (08/2018). Last CT showed normal aortic graft with no  postop complications.  Continue monitoring with yearly CT. Continue Toprol xl 75 mg twice daily. ?History of mechanical aortic valve replacement, keep appointments with Coumadin clinic for frequent INR checks.  Last echo

## 2021-09-30 ENCOUNTER — Ambulatory Visit (INDEPENDENT_AMBULATORY_CARE_PROVIDER_SITE_OTHER): Payer: BC Managed Care – PPO | Admitting: Internal Medicine

## 2021-09-30 ENCOUNTER — Encounter: Payer: Self-pay | Admitting: Internal Medicine

## 2021-09-30 VITALS — BP 118/68 | HR 77 | Ht 67.0 in | Wt 188.2 lb

## 2021-09-30 DIAGNOSIS — E782 Mixed hyperlipidemia: Secondary | ICD-10-CM | POA: Diagnosis not present

## 2021-09-30 DIAGNOSIS — G43809 Other migraine, not intractable, without status migrainosus: Secondary | ICD-10-CM

## 2021-09-30 DIAGNOSIS — Z23 Encounter for immunization: Secondary | ICD-10-CM | POA: Diagnosis not present

## 2021-09-30 DIAGNOSIS — F411 Generalized anxiety disorder: Secondary | ICD-10-CM

## 2021-09-30 DIAGNOSIS — Z0000 Encounter for general adult medical examination without abnormal findings: Secondary | ICD-10-CM

## 2021-09-30 DIAGNOSIS — I2510 Atherosclerotic heart disease of native coronary artery without angina pectoris: Secondary | ICD-10-CM | POA: Diagnosis not present

## 2021-09-30 DIAGNOSIS — Z7901 Long term (current) use of anticoagulants: Secondary | ICD-10-CM

## 2021-09-30 NOTE — Progress Notes (Signed)
? ? ?Date:  09/30/2021  ? ?Name:  Henry Duncan   DOB:  11/10/1990   MRN:  [id number] ? ? ?Chief Complaint: Hypertension and Hyperlipidemia ?Henry Duncan is a 31 y.o. male who presents today for his Complete Annual Exam. He feels fairly well. He reports exercising none. He reports he is sleeping well. He and his wife are expecting a baby in October. ? ?Colonoscopy: none ? ?Immunization History  ?Administered Date(s) Administered  ? Hepatitis B, adult 09/02/2015, 11/06/2015, 03/03/2016  ? Tdap 09/02/2011  ? ?Health Maintenance Due  ?Topic Date Due  ? HIV Screening  Never done  ? TETANUS/TDAP  09/01/2021  ?  ?No results found for: PSA1, PSA ? ? ?Hypertension ?This is a chronic problem. The problem is controlled. 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 Pertinent negatives include no palpitations or shortness of breath. Past treatments include beta blockers and direct vasodilators. Hypertensive end-organ damage includes CAD/MI (aortic aneurysm and valve replacement).  ?Hyperlipidemia ?This is a chronic problem. The problem is controlled. Associated symptoms include chest pain (on left side of chest - worse when lifting heavy buckets at work). Pertinent negatives include no myalgias or shortness of breath. Current antihyperlipidemic treatment includes statins.  ? ?Lab Results  ?Component Value Date  ? NA 144 06/17/2021  ? K 4.2 06/17/2021  ? CO2 24 06/17/2021  ? GLUCOSE 82 06/17/2021  ? BUN 8 06/17/2021  ? CREATININE 0.65 (L) 06/17/2021  ? CALCIUM 9.3 06/17/2021  ? EGFR 131 06/17/2021  ? GFRNONAA >60 05/23/2021  ? ?Lab Results  ?Component Value Date  ? CHOL 174 09/16/2021  ? HDL 39 (L) 09/16/2021  ? LDLCALC 105 (H) 09/16/2021  ? TRIG 169 (H) 09/16/2021  ? CHOLHDL 4.5 09/16/2021  ? ?Lab Results  ?Component Value Date  ? TSH 0.559 09/03/2020  ? ?Lab Results  ?Component Value Date  ? HGBA1C 5.5 06/17/2021  ? ?Lab  Results  ?Component Value Date  ? WBC 10.0 06/11/2021  ? HGB 16.5 06/11/2021  ? HCT 47.8 06/11/2021  ? MCV 91 06/11/2021  ? PLT 208 06/11/2021  ? ?Lab Results  ?Component Value Date  ? ALT 59 (H) 06/17/2021  ? AST 30 06/17/2021  ? ALKPHOS 100 06/17/2021  ? BILITOT 0.3 06/17/2021  ? ?No results found for: 25OHVITD2, [place], VD25OH  ? ?Review of Systems  ?Constitutional:  Negative for appetite change, chills, diaphoresis, fatigue and unexpected weight change.  ?HENT:  Negative for hearing loss, tinnitus, trouble swallowing and voice change.   ?Eyes:  Negative for visual disturbance.  ?Respiratory:  Negative for choking, shortness of breath and wheezing.   ?Cardiovascular:  Positive for chest pain (on left side of chest - worse when lifting heavy buckets at work). Negative for palpitations and leg swelling.  ?Gastrointestinal:  Negative for abdominal pain, blood in stool, constipation and diarrhea.  ?Genitourinary:  Negative for difficulty urinating, dysuria and frequency.  ?Musculoskeletal:  Negative for arthralgias, back pain and myalgias.  ?Skin:  Negative for color change and rash.  ?Neurological:  Positive for headaches (rare migraine). Negative for dizziness and syncope.  ?Hematological:  Negative for adenopathy.  ?Psychiatric/Behavioral:  Negative for dysphoric mood and sleep disturbance. The patient is not nervous/anxious.   ? ?Patient Active Problem List  ? Diagnosis Date Noted  ? Mixed hyperlipidemia 09/09/2021  ? Onychodystrophy 06/25/2021  ? Neoplasm of uncertain behavior of skin 06/25/2021  ? Generalized  anxiety disorder 06/25/2021  ? Abnormal cardiac CT angiography   ? Coronary artery disease involving native coronary artery of native heart   ? Warfarin anticoagulation 03/15/2019  ? Encounter for therapeutic drug monitoring 09/29/2018  ? Aortic valve prosthesis present 09/29/2018  ? S/P ascending aortic aneurysm repair 09/19/2018  ? Allergic rhinitis 09/06/2018  ? Hepatic steatosis 10/30/2016  ?  Insomnia 11/29/2015  ? Migraine 11/29/2015  ? ? ?No Known Allergies ? ?Past Surgical History:  ?Procedure Laterality Date  ? BENTALL PROCEDURE N/A 09/19/2018  ? Procedure: BENTALL PROCEDURE WITH [place].;  Surgeon: Gaye Pollack, MD;  [facility];  Service: Open Heart Surgery;  Laterality: N/A;  ? CARDIAC SURGERY    ? CLEFT LIP REPAIR    ? CORONARY ANGIOGRAPHY Left 06/13/2021  ? Procedure: CORONARY ANGIOGRAPHY (CATH LAB);  Surgeon: Wellington Hampshire, MD;  Location: [place] [facility];  Service: Cardiovascular;  Laterality: Left;  ? CORONARY PRESSURE WIRE/FFR WITH 3D MAPPING N/A 06/13/2021  ? Procedure: Coronary Pressure Wire, IFR;  Surgeon: Wellington Hampshire, MD;  Location: [place] [facility];  Service: Cardiovascular;  Laterality: N/A;  ? SUBXYPHOID PERICARDIAL WINDOW N/A 09/26/2018  ? Procedure: SUBXYPHOID PERICARDIAL WINDOW;  Surgeon: Gaye Pollack, MD;  Location: [place] OR;  Service: Thoracic;  Laterality: N/A;  ? TEE WITHOUT CARDIOVERSION N/A 09/19/2018  ? Procedure: TRANSESOPHAGEAL ECHOCARDIOGRAM (TEE);  Surgeon: Gaye Pollack, MD;  Location: [place];  Service: Open Heart Surgery;  Laterality: N/A;  ? ? ?Social History  ? ?Tobacco Use  ? Smoking status: Never  ? Smokeless tobacco: Never  ?Vaping Use  ? Vaping Use: Former  ?Substance Use Topics  ? Alcohol use: Yes  ? Drug use: Never  ? ? ? ?Medication list has been reviewed and updated. ? ?Current Meds  ?Medication Sig  ? atorvastatin (LIPITOR) 80 MG tablet Take 1 tablet (80 mg total) by mouth daily.  ? busPIRone (BUSPAR) 5 MG tablet Take 1 tablet (5 mg total) by mouth 2 (two) times daily.  ? cetirizine (ZYRTEC) 10 MG tablet Take 10 mg by mouth daily as needed for allergies.  ? clobetasol cream (TEMOVATE) 8.18 % Apply 1 application topically 2 (two) times daily. To external ear canals  ? fenofibrate (TRICOR) 145 MG tablet Take 1 tablet (145 mg total) by mouth daily.  ? isosorbide mononitrate (IMDUR) 30 MG 24 hr tablet Take 1 tablet (30 mg  total) by mouth daily.  ? metoprolol succinate (TOPROL-XL) 50 MG 24 hr tablet Take 1 1/2 tabs ($RemoveB'75mg'[id number]$ ) by mouth twice a day  ? warfarin (COUMADIN) 5 MG tablet Take 1.5 tablets by mouth daily or as directed by the Anticoagulation Clinic.  ? ? ? ?  09/30/2021  ? 10:51 AM 06/25/2021  ?  3:56 PM 09/03/2020  ?  7:55 AM 09/01/2019  ?  8:22 AM  ?GAD 7 : Generalized Anxiety Score  ?Nervous, Anxious, on Edge 0 1 0 0  ?Control/stop worrying 1 2 0 0  ?Worry too much - different things 0 1 0 0  ?Trouble relaxing 0 0 0 0  ?Restless 0 1 0 0  ?Easily annoyed or irritable 1 2 0 0  ?Afraid - awful might happen 0 2 0 0  ?Total GAD 7 Score 2 9 0 0  ?Anxiety Difficulty Not difficult at all Not difficult at all  Not difficult at all  ? ? ? ?  09/30/2021  ? 10:51 AM  ?Depression screen PHQ 2/9  ?  Decreased Interest 0  ?Down, Depressed, Hopeless 0  ?PHQ - 2 Score 0  ?Altered sleeping 0  ?Tired, decreased energy 0  ?Change in appetite 0  ?Feeling bad or failure about yourself  0  ?Trouble concentrating 0  ?Moving slowly or fidgety/restless 0  ?Suicidal thoughts 0  ?PHQ-9 Score 0  ?Difficult doing work/chores Not difficult at all  ? ? ?BP Readings from Last 3 Encounters:  ?09/30/21 118/68  ?09/22/21 110/70  ?06/25/21 96/62  ? ? ?Physical Exam ?Vitals and nursing note reviewed.  ?Constitutional:   ?   Appearance: Normal appearance. He is well-developed.  ?HENT:  ?   Head: Normocephalic.  ?   Right Ear: Tympanic membrane, ear canal and external ear normal.  ?   Left Ear: Tympanic membrane, ear canal and external ear normal.  ?   Nose: Nose normal.  ?Eyes:  ?   Conjunctiva/sclera: Conjunctivae normal.  ?   Pupils: Pupils are equal, round, and reactive to light.  ?Neck:  ?   Thyroid: No thyromegaly.  ?   Vascular: No carotid bruit.  ?Cardiovascular:  ?   Rate and Rhythm: Normal rate and regular rhythm.  ?   Pulses: Normal pulses.  ?   Heart sounds: No murmur heard. ?   Comments: Loud mechanical valve click throughout the chest ?Pulmonary:  ?   Effort:  Pulmonary effort is normal.  ?   Breath sounds: Normal breath sounds. No wheezing.  ?Chest:  ?   Chest wall: Tenderness (mild tenderness left pectoral region) present.  ?Breasts: ?   Right: No mass.  ?   Left: No

## 2021-10-01 DIAGNOSIS — G4733 Obstructive sleep apnea (adult) (pediatric): Secondary | ICD-10-CM | POA: Diagnosis not present

## 2021-10-01 LAB — CBC WITH DIFFERENTIAL/PLATELET
Basophils Absolute: 0.1 10*3/uL (ref 0.0–0.2)
Basos: 1 %
EOS (ABSOLUTE): 0.1 10*3/uL (ref 0.0–0.4)
Eos: 1 %
Hematocrit: 47.5 % (ref 37.5–51.0)
Hemoglobin: 16.2 g/dL (ref 13.0–17.7)
Immature Grans (Abs): 0.1 10*3/uL (ref 0.0–0.1)
Immature Granulocytes: 1 %
Lymphocytes Absolute: 1.8 10*3/uL (ref 0.7–3.1)
Lymphs: 20 %
MCH: 31 pg (ref 26.6–33.0)
MCHC: 34.1 g/dL (ref 31.5–35.7)
MCV: 91 fL (ref 79–97)
Monocytes Absolute: 0.8 10*3/uL (ref 0.1–0.9)
Monocytes: 8 %
Neutrophils Absolute: 6.3 10*3/uL (ref 1.4–7.0)
Neutrophils: 69 %
Platelets: 216 10*3/uL (ref 150–450)
RBC: 5.23 x10E6/uL (ref 4.14–5.80)
RDW: 12.8 % (ref 11.6–15.4)
WBC: 9.1 10*3/uL (ref 3.4–10.8)

## 2021-10-01 LAB — COMPREHENSIVE METABOLIC PANEL
ALT: 25 IU/L (ref 0–44)
AST: 21 IU/L (ref 0–40)
Albumin/Globulin Ratio: 2 (ref 1.2–2.2)
Albumin: 4.9 g/dL (ref 4.1–5.2)
Alkaline Phosphatase: 81 IU/L (ref 44–121)
BUN/Creatinine Ratio: 18 (ref 9–20)
BUN: 12 mg/dL (ref 6–20)
Bilirubin Total: 0.7 mg/dL (ref 0.0–1.2)
CO2: 19 mmol/L — ABNORMAL LOW (ref 20–29)
Calcium: 9.6 mg/dL (ref 8.7–10.2)
Chloride: 100 mmol/L (ref 96–106)
Creatinine, Ser: 0.68 mg/dL — ABNORMAL LOW (ref 0.76–1.27)
Globulin, Total: 2.4 g/dL (ref 1.5–4.5)
Glucose: 99 mg/dL (ref 70–99)
Potassium: 4.4 mmol/L (ref 3.5–5.2)
Sodium: 141 mmol/L (ref 134–144)
Total Protein: 7.3 g/dL (ref 6.0–8.5)
eGFR: 128 mL/min/{1.73_m2} (ref >59)

## 2021-10-01 LAB — LIPID PANEL
Chol/HDL Ratio: 4.6 ratio (ref 0.0–5.0)
Cholesterol, Total: 211 mg/dL — ABNORMAL HIGH (ref 100–199)
HDL: 46 mg/dL (ref >39)
LDL Chol Calc (NIH): 133 mg/dL — ABNORMAL HIGH (ref 0–99)
Triglycerides: 179 mg/dL — ABNORMAL HIGH (ref 0–149)
VLDL Cholesterol Cal: 32 mg/dL (ref 5–40)

## 2021-10-01 LAB — TSH: TSH: 0.732 u[IU]/mL (ref 0.450–4.500)

## 2021-10-01 NOTE — Progress Notes (Signed)
Informed pt with labs per Dr. Army Melia. Pt verbalized understanding. ? ?KP ?

## 2021-10-13 ENCOUNTER — Encounter: Payer: Self-pay | Admitting: Cardiology

## 2021-10-14 ENCOUNTER — Telehealth: Payer: Self-pay

## 2021-10-14 MED ORDER — AMOXICILLIN 500 MG PO TABS: ORAL_TABLET | ORAL | 0 refills | Status: DC

## 2021-10-14 NOTE — Telephone Encounter (Signed)
-----   Message from Kate Sable, MD sent at 10/13/2021  5:44 PM EDT ----- Please call in a one time dose of amoxicillin 2g x 1 for patient to take 30-60 mins prior to dental procedure.

## 2021-10-14 NOTE — Telephone Encounter (Signed)
Sent prescription in as requested by provider as documented below and sent a MyChart message to patient informing him of this.

## 2021-10-29 ENCOUNTER — Ambulatory Visit (INDEPENDENT_AMBULATORY_CARE_PROVIDER_SITE_OTHER): Payer: BC Managed Care – PPO

## 2021-10-29 DIAGNOSIS — Z952 Presence of prosthetic heart valve: Secondary | ICD-10-CM | POA: Diagnosis not present

## 2021-10-29 DIAGNOSIS — Z5181 Encounter for therapeutic drug level monitoring: Secondary | ICD-10-CM

## 2021-10-29 LAB — POCT INR: INR: 8 — AB (ref 2.0–3.0)

## 2021-10-29 NOTE — Patient Instructions (Signed)
-  [place] Sunday, Eat greens tonight and tomorrow  - resume warfarin dosage of 1.5 tablets every day - recheck INR in 2 weeks

## 2021-11-01 DIAGNOSIS — G4733 Obstructive sleep apnea (adult) (pediatric): Secondary | ICD-10-CM | POA: Diagnosis not present

## 2021-11-11 ENCOUNTER — Other Ambulatory Visit: Payer: Self-pay | Admitting: Internal Medicine

## 2021-11-11 DIAGNOSIS — F411 Generalized anxiety disorder: Secondary | ICD-10-CM

## 2021-11-11 NOTE — Telephone Encounter (Signed)
Refilled 07/27/2021 #[id number] refills. Requested Prescriptions  Pending Prescriptions Disp Refills  . busPIRone (BUSPAR) 5 MG tablet [Pharmacy Med Name: BUSPIRONE 5 MG TAB[*]] 60 tablet 3    Sig: TAKE ONE TABLET BY MOUTH TWICE A DAY     Psychiatry: Anxiolytics/Hypnotics - Non-controlled Passed - 11/11/2021  2:18 AM      Passed - Valid encounter within last 12 months    Recent Outpatient Visits          1 month ago Annual physical exam   [facility] Glean Hess, MD   4 months ago Generalized anxiety disorder   [facility] Glean Hess, MD   1 year ago Annual physical exam   [facility] Glean Hess, MD   2 years ago Annual physical exam   [facility] Glean Hess, MD   2 years ago Disordered sleep   [facility] Glean Hess, MD      Future Appointments            In 1 month Army Melia Jesse Sans, MD [facility], [place]   In 10 months Army Melia Jesse Sans, MD [facility], [facility]

## 2021-11-12 ENCOUNTER — Ambulatory Visit (INDEPENDENT_AMBULATORY_CARE_PROVIDER_SITE_OTHER): Payer: BC Managed Care – PPO

## 2021-11-12 DIAGNOSIS — Z5181 Encounter for therapeutic drug level monitoring: Secondary | ICD-10-CM

## 2021-11-12 DIAGNOSIS — Z952 Presence of prosthetic heart valve: Secondary | ICD-10-CM

## 2021-11-12 LAB — POCT INR: INR: 2.6 (ref 2.0–3.0)

## 2021-11-12 NOTE — Patient Instructions (Signed)
-   resume warfarin dosage of 1.5 tablets every day - recheck INR in 5 weeks

## 2021-11-24 ENCOUNTER — Encounter: Payer: Self-pay | Admitting: Cardiology

## 2021-11-26 ENCOUNTER — Other Ambulatory Visit: Payer: Self-pay

## 2021-11-26 DIAGNOSIS — E782 Mixed hyperlipidemia: Secondary | ICD-10-CM

## 2021-12-01 ENCOUNTER — Encounter: Payer: Self-pay | Admitting: Cardiology

## 2021-12-01 DIAGNOSIS — G4733 Obstructive sleep apnea (adult) (pediatric): Secondary | ICD-10-CM | POA: Diagnosis not present

## 2021-12-01 NOTE — Telephone Encounter (Signed)
Attempted to call pt d/t reported s/s of chest pressure. No answer. VM full.  Will forward to Dr. Thereasa Solo nurse to also follow up.

## 2021-12-02 ENCOUNTER — Encounter: Payer: Self-pay | Admitting: Nurse Practitioner

## 2021-12-02 ENCOUNTER — Ambulatory Visit (INDEPENDENT_AMBULATORY_CARE_PROVIDER_SITE_OTHER): Payer: BC Managed Care – PPO | Admitting: Nurse Practitioner

## 2021-12-02 VITALS — BP 100/68 | HR 68 | Ht 67.0 in | Wt 189.2 lb

## 2021-12-02 DIAGNOSIS — E782 Mixed hyperlipidemia: Secondary | ICD-10-CM | POA: Diagnosis not present

## 2021-12-02 DIAGNOSIS — R072 Precordial pain: Secondary | ICD-10-CM

## 2021-12-02 DIAGNOSIS — Z952 Presence of prosthetic heart valve: Secondary | ICD-10-CM | POA: Diagnosis not present

## 2021-12-02 DIAGNOSIS — I2510 Atherosclerotic heart disease of native coronary artery without angina pectoris: Secondary | ICD-10-CM | POA: Diagnosis not present

## 2021-12-02 DIAGNOSIS — Z9889 Other specified postprocedural states: Secondary | ICD-10-CM

## 2021-12-02 DIAGNOSIS — Z8679 Personal history of other diseases of the circulatory system: Secondary | ICD-10-CM

## 2021-12-02 NOTE — Progress Notes (Signed)
Office Visit    Patient Name: Henry Duncan Date of Encounter: 12/02/2021  Primary Care Provider:  Glean Hess, MD Primary Cardiologist:  Henry Sable, MD  Chief Complaint    31 y/o ? w/ a h/o HTN, bicuspid Ao valve, ascending thoracic ao aneurysm and dissection s/p Bentall w/ mechanical AVR in 08/2018, postoperative pericardial effusion requiring window, and anxiety, who presents for f/u of HTN and AVR.  Past Medical History    Past Medical History:  Diagnosis Date   Allergy    Anxiety    Aortic aneurysm ([place]) 09/16/2018   a. 08/2018 Asc Ao/root aneuryms w/ dissection-->s/p Bentall w/ 2m Hemashield graft, 28mSJM AVR, and reimplantation of cor arteries; b. 04/2021 CTA Chest: stable post-op changes.   CAD (coronary artery disease)    a. 08/2018 Cor CTA: Ca2+ 99th%'ile (476). Calcified plaque in mLAD. Nl LM, LCX, RCA. S/p reimplantation of coronary arteries in setting of Bentall; b. 05/2021 Cor CTA: LAD >7071ml FFRct); c. 05/2021 Cath: LM nl, LAD 40p, 69m64mX/OM1 min irregs, RCA nl->Med rx.   H/O mechanical aortic valve replacement    a. 08/2018 in setting of bicuspid AoV and Asc Ao dissection-->23mm25m mech AVR; b. 03/2020 Echo: EF 55-60%. No rwma. Mild LVH. Nl RV fxn. Nl functioning AVR.   Hyperlipidemia    Hypertension    Pericardial effusion    a. 08/2018 post-op -->s/p window.   Past Surgical History:  Procedure Laterality Date   BENTALL PROCEDURE N/A 09/19/2018   Procedure: BENT[place] PROCEDURE [place] [address] [place]: Henry [place]  [facility];  Laterality: N/A;   CARDIAC SURGERY     CLEFT LIP REPAIR     CORONARY ANGIOGRAPHY Left 06/13/2021   Procedure: CORONARY ANGIOGRAPHY (CATH LAB);  Surgeon: Henry Duncan  [facility];  Service: Cardiovascular;  Laterality: Left;   CORONARY PRESSURE WIRE/FFR WITH 3D MAPPING N/A 06/13/2021   Procedure: Coronary Pressure Wire, IFR;   Surgeon: Henry Duncan  [facility];  Service: Cardiovascular;  Laterality: N/A;   SUBXYPHOID PERICARDIAL WINDOW N/A 09/26/2018   Procedure: SUBXYPHOID PERICARDIAL WINDOW;  Surgeon: Henry Duncan  [facility];  Service: Thoracic;  Laterality: N/A;   TEE WITHOUT CARDIOVERSION N/A 09/19/2018   Procedure: TRANSESOPHAGEAL ECHOCARDIOGRAM (TEE);  Surgeon: Henry Duncan  [facility];  Laterality: N/A;    Allergies  No Known Allergies  History of Present Illness    31 y/7? w/ a h/o HTN, bicuspid Ao valve, ascending thoracic ao aneurysm and dissection s/p Bentall w/ mechanical AVR in 08/2018, postoperative pericardial effusion requiring window, HTN, and anxiety.  In April 2020, [facility] chest pain and hypertensive urgency.  CT of the chest showed an ascending thoracic aortic aneurysm measuring 5.7 to 5.8 cm, initially without evidence of dissection.  He was subsequently referred to CT surgery and on September 16, 2018, underwent CT coronary angiography, which showed moderate to severe mid LAD disease, though there was a question of artifact, as well as ascending aortic/root aneurysm with dissection flap.  Patient was admitted and subsequently underwent Bentall procedure with mechanical AVR and reimplantation of the coronary arteries.  Postoperative course was complicated by pericardial effusion requiring window.  He has since been maintained on warfarin therapy and is followed closely in our anticoagulation clinic.  CT angio of the chest in May 2021 showed normal caliber aorta.  In late December 2022, he was evaluated in the emergency department with chest pain that occurred while running errands.  ECG was unremarkable and troponins were normal.  He subsequently followed up in cardiology clinic and underwent coronary CT angiography in January 2023, which showed severe mid LAD stenosis of greater than 70% with  a calcium score of 958.  FFR CT within the LAD was normal at 0.84 however, decision was made to pursue diagnostic catheterization given ongoing symptoms.  Diagnostic catheterization was performed on January 20 and showed a 40% proximal LAD stenosis with a 50% mid LAD lesion.  He otherwise had minor irregularities, and medical therapy was advised and he was placed on imdur w/ improvement in symptoms.  [name] was last seen in cardiology clinic on Sep 22, 2021 at which time his c/p symptoms were stable. Fenofibrate therapy was initiated in the setting of hypertriglyceridemia (169).  He presents today to follow-up related to ongoing intermittent chest pain.  He notes that over the past year, he has been experiencing sharp and fleeting precordial chest discomfort, which is fairly focal in nature, without associated symptoms, occurring randomly, lasting a few seconds, and resolving spontaneously.  Since December of last year, this has been occurring once every few minutes, which has been bothersome, though does not necessarily limit his activity or disrupt his day significantly.  Symptoms have not changed in frequency or character since CTA of the chest in December and cath in January.  He is fairly active at work and denies anginal symptoms or dyspnea.  Further, no palpitations, PND, orthopnea, dizziness, syncope, edema, or early satiety.  Home Medications    Current Outpatient Medications  Medication Sig Dispense Refill   atorvastatin (LIPITOR) 80 MG tablet Take 1 tablet (80 mg total) by mouth daily. 30 tablet 5   busPIRone (BUSPAR) 5 MG tablet Take 1 tablet (5 mg total) by mouth 2 (two) times daily. 60 tablet 3   cetirizine (ZYRTEC) 10 MG tablet Take 10 mg by mouth daily as needed for allergies.     clobetasol cream (TEMOVATE) 0.93 % Apply 1 application topically 2 (two) times daily. To external ear canals 30 g 0   fenofibrate (TRICOR) 145 MG tablet Take 1 tablet (145 mg total) by mouth daily. 30  tablet 5   isosorbide mononitrate (IMDUR) 30 MG 24 hr tablet Take 1 tablet (30 mg total) by mouth daily. 30 tablet 5   metoprolol succinate (TOPROL-XL) 50 MG 24 hr tablet Take 1 1/2 tabs ('75mg'$ ) by mouth twice a day 270 tablet 2   warfarin (COUMADIN) 5 MG tablet Take 1.5 tablets by mouth daily or as directed by the Anticoagulation Clinic. 135 tablet 1   amoxicillin (AMOXIL) 500 MG tablet Take 4 tablets (2 grams) by mouth 30-60 minutes before your procedure. (Patient not taking: Reported on 12/02/2021) 4 tablet 0   No current facility-administered medications for this visit.     Review of Systems    Ongoing intermittent sharp, focal, and fleeting precordial chest discomfort.  He denies dyspnea, palpitations, PND, orthopnea, dizziness, syncope, edema, or early satiety.  All other systems reviewed and are otherwise negative except as noted above.    Physical Exam    VS:  BP 100/68 (BP Location: Right Arm, Patient Position: Sitting, Cuff Size: Normal)   Pulse 68   Ht '5\' 7"'$  (1.702 m)   Wt 189 lb 3.2 oz (85.8 kg)  SpO2 98%   BMI 29.63 kg/m  , BMI Body mass index is 29.63 kg/m.     GEN: Well nourished, well developed, in no acute distress. HEENT: normal. Neck: Supple, no JVD, carotid bruits, or masses. Cardiac: RRR, 2/6 systolic murmur heard loudest at the upper sternal borders with crisp mechanical S2. No clubbing, cyanosis, edema.  Radials/PT 2+ and equal bilaterally.  Respiratory:  Respirations regular and unlabored, clear to auscultation bilaterally. GI: Soft, nontender, nondistended, BS + x 4. MS: no deformity or atrophy. Skin: warm and dry, no rash. Neuro:  Strength and sensation are intact. Psych: Normal affect.  Accessory Clinical Findings    ECG personally reviewed by me today - Regular sinus rhythm, 68 - no acute changes.  Lab Results  Component Value Date   WBC 9.1 09/30/2021   HGB 16.2 09/30/2021   HCT 47.5 09/30/2021   MCV 91 09/30/2021   PLT 216 09/30/2021   Lab  Results  Component Value Date   CREATININE 0.68 (L) 09/30/2021   BUN 12 09/30/2021   NA 141 09/30/2021   K 4.4 09/30/2021   CL 100 09/30/2021   CO2 19 (L) 09/30/2021   Lab Results  Component Value Date   ALT 25 09/30/2021   AST 21 09/30/2021   ALKPHOS 81 09/30/2021   BILITOT 0.7 09/30/2021   Lab Results  Component Value Date   CHOL 211 (H) 09/30/2021   HDL 46 09/30/2021   LDLCALC 133 (H) 09/30/2021   TRIG 179 (H) 09/30/2021   CHOLHDL 4.6 09/30/2021    Lab Results  Component Value Date   HGBA1C 5.5 06/17/2021    Assessment & Plan    1.  Noncardiac precordial chest pain: Patient with a history of ascending aortic aneurysm with dissection status post Bentall, mechanical AVR, and resuspension of coronary arteries in April 2020.  He has had some degree of focal, sharp, and fleeting chest discomfort ever since, but this has become more frequent since December 2022 with CTA of the chest at that time showing stable aorta, followed by diagnostic catheterization January 2023 showing moderate, nonobstructive LAD disease.  He has continued to have fairly frequent symptoms occurring throughout the day, multiple times per hour, lasting just a few seconds, and resolving spontaneously.  Symptoms do not typically limit his activity and occur randomly without associated symptoms.  Prior work-up discussed in detail.  ECG is normal today.  Reassurance provided.  Recommended primary care follow-up for ongoing management of PTSD and noncardiac chest pain.  2.  History of ascending aortic aneurysm with dissection: As above, status post Bentall with aortic root replacement, mechanical AVR, and resuspension of coronary arteries in April 2020.  Stable CTA in December 2022 with plan for follow-up in December 2023..  3.  Mechanical AVR: Followed closely in our anticoagulation clinic.  Plan for follow-up echo later this year.  4.  Nonobstructive coronary artery disease: Diagnostic catheterization in January  showed moderate LAD disease with otherwise normal coronary arteries.  His chest pain is noncardiac in origin.  He remains on statin, fibrate, beta-blocker, and nitrate therapy.  5.  Hyperlipidemia/hypertriglyceridemia: LDL 133 with triglycerides of 179 in May 2023.  He was placed on fenofibrate at that time and is also currently on atorvastatin.  He is not fasting today.  We will arrange for fasting lipids and LFTs and he will come back to the medical mall to have those checked on another day.  6.  PTSD: On BuSpar management primary care.  7.  Disposition: Patient will come back for fasting lipids and LFTs.  Follow-up in 3 months, at which time we can arrange for follow-up echo and CTA of the chest.   Murray Hodgkins, NP 12/02/2021, 8:38 AM

## 2021-12-02 NOTE — Patient Instructions (Signed)
Medication Instructions:  No changes at this time.   *If you need a refill on your cardiac medications before your next appointment, please call your pharmacy*   Lab Work: Fasting Lipid and liver panel at your earliest convenience. Nothing to eat or drink after midnight except sip of water with your medications. Go to the Medical Mall Entrance at [facility]. No appointment is needed for this. Go to 1st desk on the right to check in (REGISTRATION).   Lab hours: Monday- Friday (7:30 am- 5:30 pm)  If you have labs (blood work) drawn today and your tests are completely normal, you will receive your results only by: [place] (if you have MyChart) OR A paper copy in the mail If you have any lab test that is abnormal or we need to change your treatment, we will call you to review the results.   Testing/Procedures: None   Follow-Up: At [facility], you and your health needs are our priority.  As part of our continuing mission to provide you with exceptional heart care, we have created designated Provider Care Teams.  These Care Teams include your primary Cardiologist (physician) and Advanced Practice Providers (APPs -  Physician Assistants and Nurse Practitioners) who all work together to provide you with the care you need, when you need it.   Your next appointment:   3 month(s)  The format for your next appointment:   In Person  Provider:   Kate Sable, MD or Murray Hodgkins, NP        Important Information About Sugar

## 2021-12-03 ENCOUNTER — Other Ambulatory Visit: Payer: Self-pay | Admitting: Internal Medicine

## 2021-12-03 DIAGNOSIS — F411 Generalized anxiety disorder: Secondary | ICD-10-CM

## 2021-12-03 NOTE — Telephone Encounter (Signed)
Requested Prescriptions  Pending Prescriptions Disp Refills  . busPIRone (BUSPAR) 5 MG tablet [Pharmacy Med Name: BUSPIRONE 5 MG TAB[*]] 60 tablet 3    Sig: TAKE ONE TABLET BY MOUTH TWICE A DAY     Psychiatry: Anxiolytics/Hypnotics - Non-controlled Passed - 12/03/2021  9:54 AM      Passed - Valid encounter within last 12 months    Recent Outpatient Visits          2 months ago Annual physical exam   [facility] Glean Hess, MD   5 months ago Generalized anxiety disorder   [facility] Glean Hess, MD   1 year ago Annual physical exam   [facility] Glean Hess, MD   2 years ago Annual physical exam   [facility] Glean Hess, MD   2 years ago Disordered sleep   [facility] Glean Hess, MD      Future Appointments            In 5 days Glean Hess, MD [facility], [place]   In 3 months Agbor-Etang, Aaron Edelman, MD [facility]   In 10 months Army Melia, Jesse Sans, MD [facility], [place]

## 2021-12-08 ENCOUNTER — Ambulatory Visit: Payer: BC Managed Care – PPO | Admitting: Internal Medicine

## 2021-12-10 ENCOUNTER — Encounter: Payer: Self-pay | Admitting: Internal Medicine

## 2021-12-11 NOTE — Telephone Encounter (Signed)
Pts response.  KP

## 2021-12-15 ENCOUNTER — Other Ambulatory Visit: Payer: Self-pay

## 2021-12-15 ENCOUNTER — Ambulatory Visit: Payer: BC Managed Care – PPO | Admitting: Internal Medicine

## 2021-12-15 DIAGNOSIS — I2510 Atherosclerotic heart disease of native coronary artery without angina pectoris: Secondary | ICD-10-CM

## 2021-12-15 MED ORDER — METOPROLOL SUCCINATE ER 50 MG PO TB24: ORAL_TABLET | ORAL | 0 refills | Status: DC

## 2021-12-15 MED ORDER — ISOSORBIDE MONONITRATE ER 30 MG PO TB24: 30.0000 mg | ORAL_TABLET | Freq: Every day | ORAL | 1 refills | Status: DC

## 2021-12-16 ENCOUNTER — Encounter: Payer: Self-pay | Admitting: Cardiology

## 2021-12-16 ENCOUNTER — Telehealth: Payer: Self-pay | Admitting: Cardiology

## 2021-12-16 MED ORDER — AMOXICILLIN 500 MG PO TABS: ORAL_TABLET | ORAL | 0 refills | Status: DC

## 2021-12-16 NOTE — Telephone Encounter (Signed)
Called patient and informed him that I sent in the same prescription for Amoxicillin that we used for his previous procedure:  amoxicillin 2g to take 30-60 minutes before your procedure.  Also advised patient to let Legrand Como RN know at his INR check tomorrow that he will be taking this dose prior to his upcoming dental procedure.  Patient verbalized understanding and agreed with plan.

## 2021-12-16 NOTE — Telephone Encounter (Signed)
Patient's wife called wanting to know if he needs to take antibiotics prior to having his widsom tooth pulled.  He is having it done this Thursday.

## 2021-12-17 ENCOUNTER — Ambulatory Visit (INDEPENDENT_AMBULATORY_CARE_PROVIDER_SITE_OTHER): Payer: BC Managed Care – PPO

## 2021-12-17 DIAGNOSIS — Z5181 Encounter for therapeutic drug level monitoring: Secondary | ICD-10-CM

## 2021-12-17 DIAGNOSIS — Z952 Presence of prosthetic heart valve: Secondary | ICD-10-CM | POA: Diagnosis not present

## 2021-12-17 LAB — POCT INR: INR: 3.8 — AB (ref 2.0–3.0)

## 2021-12-17 NOTE — Patient Instructions (Signed)
-  HOLD TONIGHT and EAT GREENS TONIGHT  - resume warfarin dosage of 1.5 tablets every day - recheck INR in 4 weeks

## 2021-12-18 ENCOUNTER — Other Ambulatory Visit
Admission: RE | Admit: 2021-12-18 | Discharge: 2021-12-18 | Disposition: A | Discharge status: Home / Self Care | Payer: BC Managed Care – PPO | Attending: Cardiology | Admitting: Cardiology

## 2021-12-18 ENCOUNTER — Telehealth: Payer: Self-pay | Admitting: Cardiology

## 2021-12-18 DIAGNOSIS — E782 Mixed hyperlipidemia: Secondary | ICD-10-CM | POA: Diagnosis not present

## 2021-12-18 DIAGNOSIS — Z952 Presence of prosthetic heart valve: Secondary | ICD-10-CM | POA: Diagnosis not present

## 2021-12-18 DIAGNOSIS — I2510 Atherosclerotic heart disease of native coronary artery without angina pectoris: Secondary | ICD-10-CM | POA: Insufficient documentation

## 2021-12-18 LAB — LIPID PANEL
Cholesterol: 189 mg/dL (ref 0–200)
HDL: 41 mg/dL (ref >40)
LDL Cholesterol: 135 mg/dL — ABNORMAL HIGH (ref 0–99)
Total CHOL/HDL Ratio: 4.6 RATIO
Triglycerides: 66 mg/dL (ref <150)
VLDL: 13 mg/dL (ref 0–40)

## 2021-12-18 LAB — HEPATIC FUNCTION PANEL
ALT: 35 U/L (ref 0–44)
AST: 29 U/L (ref 15–41)
Albumin: 4.5 g/dL (ref 3.5–5.0)
Alkaline Phosphatase: 50 U/L (ref 38–126)
Bilirubin, Direct: 0.1 mg/dL (ref 0.0–0.2)
Indirect Bilirubin: 0.8 mg/dL (ref 0.3–0.9)
Total Bilirubin: 0.9 mg/dL (ref 0.3–1.2)
Total Protein: 7.6 g/dL (ref 6.5–8.1)

## 2021-12-18 NOTE — Telephone Encounter (Signed)
I spoke to patient's wife and rescheduled INR check for Dental work 8/10.

## 2021-12-18 NOTE — Telephone Encounter (Signed)
Spouse would like to know if pt's appt on 01/14/22 can be moved up to 12/31/21 due to him having to have a tooth pulled. Spouse states that the office would like to do procedure on 01/01/22 but would like for pt's INR to be checked first. She would like a callback regarding this matter. Please advise

## 2021-12-31 ENCOUNTER — Ambulatory Visit (INDEPENDENT_AMBULATORY_CARE_PROVIDER_SITE_OTHER): Payer: BC Managed Care – PPO

## 2021-12-31 DIAGNOSIS — Z952 Presence of prosthetic heart valve: Secondary | ICD-10-CM | POA: Diagnosis not present

## 2021-12-31 DIAGNOSIS — Z5181 Encounter for therapeutic drug level monitoring: Secondary | ICD-10-CM | POA: Diagnosis not present

## 2021-12-31 LAB — POCT INR: INR: 3.4 — AB (ref 2.0–3.0)

## 2021-12-31 NOTE — Patient Instructions (Signed)
-  HOLD TONIGHT  - DECREASE TO 1.5 tablets every day, except 1 tablet Monday and Friday - recheck INR in 1 week

## 2022-01-05 ENCOUNTER — Encounter: Payer: Self-pay | Admitting: Internal Medicine

## 2022-01-05 ENCOUNTER — Other Ambulatory Visit: Payer: Self-pay | Admitting: *Deleted

## 2022-01-05 MED ORDER — ATORVASTATIN CALCIUM 80 MG PO TABS: 80.0000 mg | ORAL_TABLET | Freq: Every day | ORAL | 2 refills | Status: DC

## 2022-01-06 ENCOUNTER — Ambulatory Visit (INDEPENDENT_AMBULATORY_CARE_PROVIDER_SITE_OTHER): Payer: BC Managed Care – PPO | Admitting: Internal Medicine

## 2022-01-06 ENCOUNTER — Encounter: Payer: Self-pay | Admitting: Internal Medicine

## 2022-01-06 VITALS — BP 102/82 | HR 87 | Ht 67.0 in | Wt 185.0 lb

## 2022-01-06 DIAGNOSIS — F411 Generalized anxiety disorder: Secondary | ICD-10-CM | POA: Diagnosis not present

## 2022-01-06 DIAGNOSIS — I2510 Atherosclerotic heart disease of native coronary artery without angina pectoris: Secondary | ICD-10-CM | POA: Diagnosis not present

## 2022-01-06 DIAGNOSIS — L400 Psoriasis vulgaris: Secondary | ICD-10-CM | POA: Diagnosis not present

## 2022-01-06 DIAGNOSIS — L408 Other psoriasis: Secondary | ICD-10-CM | POA: Diagnosis not present

## 2022-01-06 MED ORDER — ALPRAZOLAM 0.5 MG PO TABS: 0.5000 mg | ORAL_TABLET | Freq: Two times a day (BID) | ORAL | 0 refills | Status: DC | PRN

## 2022-01-06 NOTE — Progress Notes (Signed)
Date:  01/06/2022   Name:  Henry Duncan   DOB:  Jun 26, 1990   MRN:  [id number]   Chief Complaint: Anxiety  Anxiety Presents for follow-up visit. Symptoms include chest pain, excessive worry, nervous/anxious behavior, obsessions and restlessness. Patient reports no decreased concentration, dizziness, palpitations, shortness of breath or suicidal ideas. Symptoms occur occasionally. The severity of symptoms is causing significant distress. The patient sleeps 6 hours (6-8 hours) per night. The quality of sleep is fair. Nighttime awakenings: one to two.    He is currently on Buspar 5 mg bid and has noticed some slight benefit.  However he still has chest pain intermittently -complete cardiac workup has been non revealing and felt to be due to scar tissue.  When he has chest pain or even mild arm discomfort, he becomes very anxious and is unable to control his worry.   This is complicated by stress at work - applied for a promotion and also by upcoming birth of his baby in October. He has tried multiple medications -  clonazepam, sertraline, Effexor, Celexa  Lab Results  Component Value Date   NA 141 09/30/2021   K 4.4 09/30/2021   CO2 19 (L) 09/30/2021   GLUCOSE 99 09/30/2021   BUN 12 09/30/2021   CREATININE 0.68 (L) 09/30/2021   CALCIUM 9.6 09/30/2021   EGFR 128 09/30/2021   GFRNONAA >60 05/23/2021   Lab Results  Component Value Date   CHOL 189 12/18/2021   HDL 41 12/18/2021   LDLCALC 135 (H) 12/18/2021   TRIG 66 12/18/2021   CHOLHDL 4.6 12/18/2021   Lab Results  Component Value Date   TSH 0.732 09/30/2021   Lab Results  Component Value Date   HGBA1C 5.5 06/17/2021   Lab Results  Component Value Date   WBC 9.1 09/30/2021   HGB 16.2 09/30/2021   HCT 47.5 09/30/2021   MCV 91 09/30/2021   PLT 216 09/30/2021   Lab Results  Component Value Date   ALT 35 12/18/2021   AST 29 12/18/2021   ALKPHOS 50 12/18/2021   BILITOT 0.9 12/18/2021   No results found for:  "25OHVITD2", "25OHVITD3", "VD25OH"   Review of Systems  Constitutional:  Negative for appetite change, fatigue and fever.  Respiratory:  Negative for chest tightness and shortness of breath.   Cardiovascular:  Positive for chest pain. Negative for palpitations and leg swelling.  Neurological:  Negative for dizziness and headaches.  Psychiatric/Behavioral:  Negative for agitation, decreased concentration, dysphoric mood, sleep disturbance and suicidal ideas. The patient is nervous/anxious.     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   Neoplasm of uncertain behavior of skin 06/25/2021   Generalized anxiety disorder 06/25/2021   Abnormal cardiac CT angiography    Coronary artery disease involving native coronary artery of native heart    Warfarin anticoagulation 03/15/2019   Encounter for therapeutic drug monitoring 09/29/2018   Aortic valve prosthesis present 09/29/2018   S/P ascending aortic aneurysm repair 09/19/2018   Allergic rhinitis 09/06/2018   Hepatic steatosis 10/30/2016   Insomnia 11/29/2015   Migraine 11/29/2015    No Known Allergies  Past Surgical History:  Procedure Laterality Date   BENTALL PROCEDURE N/A 09/19/2018   Procedure: BENTALL PROCEDURE WITH [place].;  Surgeon: Gaye Pollack, MD;  [facility];  Service: Open Heart Surgery;  Laterality: N/A;   CARDIAC SURGERY     CLEFT LIP REPAIR  CORONARY ANGIOGRAPHY Left 06/13/2021   Procedure: CORONARY ANGIOGRAPHY (CATH LAB);  Surgeon: Wellington Hampshire, MD;  Location: [place] CV LAB;  Service: Cardiovascular;  Laterality: Left;   CORONARY PRESSURE WIRE/FFR WITH 3D MAPPING N/A 06/13/2021   Procedure: Coronary Pressure Wire, IFR;  Surgeon: Wellington Hampshire, MD;  Location: [place] CV LAB;  Service: Cardiovascular;  Laterality: N/A;   SUBXYPHOID PERICARDIAL WINDOW N/A 09/26/2018   Procedure: SUBXYPHOID PERICARDIAL  WINDOW;  Surgeon: Gaye Pollack, MD;  [facility];  Service: Thoracic;  Laterality: N/A;   TEE WITHOUT CARDIOVERSION N/A 09/19/2018   Procedure: TRANSESOPHAGEAL ECHOCARDIOGRAM (TEE);  Surgeon: Gaye Pollack, MD;  Location: [place];  Service: Open Heart Surgery;  Laterality: N/A;    Social History   Tobacco Use   Smoking status: Never   Smokeless tobacco: Never  Vaping Use   Vaping Use: Former  Substance Use Topics   Alcohol use: Yes   Drug use: Never     Medication list has been reviewed and updated.  Current Meds  Medication Sig   atorvastatin (LIPITOR) 80 MG tablet Take 1 tablet (80 mg total) by mouth daily.   busPIRone (BUSPAR) 5 MG tablet TAKE ONE TABLET BY MOUTH TWICE A DAY   cetirizine (ZYRTEC) 10 MG tablet Take 10 mg by mouth daily as needed for allergies.   clobetasol cream (TEMOVATE) 2.11 % Apply 1 application topically 2 (two) times daily. To external ear canals   fenofibrate (TRICOR) 145 MG tablet Take 1 tablet (145 mg total) by mouth daily.   isosorbide mononitrate (IMDUR) 30 MG 24 hr tablet Take 1 tablet (30 mg total) by mouth daily.   metoprolol succinate (TOPROL-XL) 50 MG 24 hr tablet Take 1 1/2 tabs (31m) by mouth twice a day   warfarin (COUMADIN) 5 MG tablet Take 1.5 tablets by mouth daily or as directed by the Anticoagulation Clinic.   [DISCONTINUED] amoxicillin (AMOXIL) 500 MG tablet Take 4 tablets (2 grams) by mouth 30-60 minutes before your procedure.       01/06/2022   11:27 AM 09/30/2021   10:51 AM 06/25/2021    3:56 PM 09/03/2020    7:55 AM  GAD 7 : Generalized Anxiety Score  Nervous, Anxious, on Edge 1 0 1 0  Control/stop worrying 1 1 2  0  Worry too much - different things 2 0 1 0  Trouble relaxing 1 0 0 0  Restless 1 0 1 0  Easily annoyed or irritable 1 1 2  0  Afraid - awful might happen 2 0 2 0  Total GAD 7 Score 9 2 9  0  Anxiety Difficulty Somewhat difficult Not difficult at all Not difficult at all        01/06/2022   11:27 AM 09/30/2021    10:51 AM 06/25/2021    3:57 PM  Depression screen PHQ 2/9  Decreased Interest 1 0 1  Down, Depressed, Hopeless 1 0 1  PHQ - 2 Score 2 0 2  Altered sleeping 0 0 1  Tired, decreased energy 0 0 1  Change in appetite 0 0 1  Feeling bad or failure about yourself  0 0 0  Trouble concentrating 0 0 0  Moving slowly or fidgety/restless 0 0 0  Suicidal thoughts 0 0 0  PHQ-9 Score 2 0 5  Difficult doing work/chores Not difficult at all Not difficult at all Not difficult at all    BP Readings from Last 3 Encounters:  01/06/22 102/82  12/02/21 100/68  09/30/21 118/68    Physical Exam Vitals and nursing note reviewed.  Constitutional:      General: He is not in acute distress.    Appearance: Normal appearance. He is well-developed.  HENT:     Head: Normocephalic and atraumatic.  Cardiovascular:     Rate and Rhythm: Normal rate and regular rhythm.     Heart sounds: Murmur (with mechanical S2) heard.     Systolic murmur is present with a grade of 2/6.  Pulmonary:     Effort: Pulmonary effort is normal. No respiratory distress.  Musculoskeletal:     Left shoulder: Normal.     Left upper arm: No swelling, deformity or tenderness.     Left elbow: Normal.     Cervical back: Normal range of motion.     Right lower leg: No edema.     Left lower leg: No edema.  Skin:    General: Skin is warm and dry.     Findings: No rash.  Neurological:     Mental Status: He is alert and oriented to person, place, and time.  Psychiatric:        Mood and Affect: Mood normal.        Behavior: Behavior normal.     Wt Readings from Last 3 Encounters:  01/06/22 185 lb (83.9 kg)  12/02/21 189 lb 3.2 oz (85.8 kg)  09/30/21 188 lb 3.2 oz (85.4 kg)    BP 102/82   Pulse 87   Ht 5' 7"  (1.702 m)   Wt 185 lb (83.9 kg)   SpO2 96%   BMI 28.98 kg/m   Assessment and Plan: 1. Generalized anxiety disorder Due to lack of benefit from multiple medications and intermittent symptoms I recommend he use  medication PRN. Will start with Xanax 0.5 mg daily - anticipate no more than once weekly Follow up with MyChart message regarding response Continue Buspar bid - ALPRAZolam (XANAX) 0.5 MG tablet; Take 1 tablet (0.5 mg total) by mouth 2 (two) times daily as needed for anxiety.  Dispense: 20 tablet; Refill: 0  2. Coronary artery disease involving native coronary artery of native heart without angina pectoris Complete cardiology evaluation has been done BP is excellent and he is taking all his medications as prescribed Chest pain is felt to be due to scar tissue   Partially dictated using Dragon software. Any errors are unintentional.  Halina Maidens, MD [facility]  01/06/2022

## 2022-01-07 ENCOUNTER — Encounter: Payer: Self-pay | Admitting: Cardiology

## 2022-01-08 ENCOUNTER — Ambulatory Visit: Payer: BC Managed Care – PPO | Admitting: Internal Medicine

## 2022-01-12 ENCOUNTER — Other Ambulatory Visit: Payer: Self-pay | Admitting: *Deleted

## 2022-01-12 DIAGNOSIS — Z5181 Encounter for therapeutic drug level monitoring: Secondary | ICD-10-CM

## 2022-01-12 DIAGNOSIS — Z7901 Long term (current) use of anticoagulants: Secondary | ICD-10-CM

## 2022-01-12 MED ORDER — WARFARIN SODIUM 5 MG PO TABS: ORAL_TABLET | ORAL | 1 refills | Status: DC

## 2022-01-14 ENCOUNTER — Ambulatory Visit (INDEPENDENT_AMBULATORY_CARE_PROVIDER_SITE_OTHER): Payer: BC Managed Care – PPO

## 2022-01-14 DIAGNOSIS — Z5181 Encounter for therapeutic drug level monitoring: Secondary | ICD-10-CM

## 2022-01-14 DIAGNOSIS — Z952 Presence of prosthetic heart valve: Secondary | ICD-10-CM

## 2022-01-14 LAB — POCT INR: INR: 2.8 (ref 2.0–3.0)

## 2022-01-14 NOTE — Patient Instructions (Signed)
-   Continue 1.5 tablets every day, except 1 tablet Monday and Friday - recheck INR in 3 weeks 

## 2022-01-21 ENCOUNTER — Ambulatory Visit: Payer: BC Managed Care – PPO | Admitting: Internal Medicine

## 2022-01-25 ENCOUNTER — Encounter: Payer: Self-pay | Admitting: Internal Medicine

## 2022-01-27 ENCOUNTER — Encounter: Payer: Self-pay | Admitting: Cardiology

## 2022-01-27 NOTE — Telephone Encounter (Signed)
Please review.  KP

## 2022-02-04 ENCOUNTER — Ambulatory Visit: Payer: BC Managed Care – PPO | Attending: Cardiology

## 2022-02-04 DIAGNOSIS — Z952 Presence of prosthetic heart valve: Secondary | ICD-10-CM | POA: Diagnosis not present

## 2022-02-04 DIAGNOSIS — Z5181 Encounter for therapeutic drug level monitoring: Secondary | ICD-10-CM | POA: Diagnosis not present

## 2022-02-04 LAB — POCT INR: INR: 2 (ref 2.0–3.0)

## 2022-02-04 NOTE — Patient Instructions (Signed)
-  Take 2 tablets Today Only - Continue 1.5 tablets every day, except 1 tablet Monday and Friday - recheck INR in 3 weeks

## 2022-02-09 ENCOUNTER — Other Ambulatory Visit: Payer: Self-pay | Admitting: Internal Medicine

## 2022-02-09 ENCOUNTER — Encounter: Payer: Self-pay | Admitting: Internal Medicine

## 2022-02-09 DIAGNOSIS — F411 Generalized anxiety disorder: Secondary | ICD-10-CM

## 2022-02-09 MED ORDER — ALPRAZOLAM 0.5 MG PO TABS: 0.5000 mg | ORAL_TABLET | Freq: Two times a day (BID) | ORAL | 0 refills | Status: DC | PRN

## 2022-02-10 ENCOUNTER — Other Ambulatory Visit: Payer: Self-pay

## 2022-02-10 DIAGNOSIS — I2510 Atherosclerotic heart disease of native coronary artery without angina pectoris: Secondary | ICD-10-CM

## 2022-02-10 MED ORDER — ISOSORBIDE MONONITRATE ER 30 MG PO TB24: 30.0000 mg | ORAL_TABLET | Freq: Every day | ORAL | 0 refills | Status: DC

## 2022-02-18 ENCOUNTER — Ambulatory Visit: Payer: BC Managed Care – PPO | Attending: Cardiology

## 2022-02-18 DIAGNOSIS — Z952 Presence of prosthetic heart valve: Secondary | ICD-10-CM | POA: Diagnosis not present

## 2022-02-18 DIAGNOSIS — Z5181 Encounter for therapeutic drug level monitoring: Secondary | ICD-10-CM | POA: Diagnosis not present

## 2022-02-18 LAB — POCT INR: INR: 2.5 (ref 2.0–3.0)

## 2022-02-18 NOTE — Patient Instructions (Signed)
-   Continue 1.5 tablets every day, except 1 tablet Monday and Friday - recheck INR in 6 weeks 

## 2022-02-25 ENCOUNTER — Ambulatory Visit (INDEPENDENT_AMBULATORY_CARE_PROVIDER_SITE_OTHER): Payer: BC Managed Care – PPO | Admitting: Internal Medicine

## 2022-02-25 ENCOUNTER — Encounter: Payer: Self-pay | Admitting: Internal Medicine

## 2022-02-25 VITALS — BP 120/70 | HR 70 | Ht 67.0 in | Wt 180.0 lb

## 2022-02-25 DIAGNOSIS — F411 Generalized anxiety disorder: Secondary | ICD-10-CM

## 2022-02-25 DIAGNOSIS — M546 Pain in thoracic spine: Secondary | ICD-10-CM | POA: Diagnosis not present

## 2022-02-25 LAB — POCT URINALYSIS DIPSTICK
Bilirubin, UA: NEGATIVE
Blood, UA: NEGATIVE
Glucose, UA: NEGATIVE
Ketones, UA: NEGATIVE
Leukocytes, UA: NEGATIVE
Nitrite, UA: NEGATIVE
Protein, UA: NEGATIVE
Spec Grav, UA: 1.02 (ref 1.010–1.025)
Urobilinogen, UA: 0.2 E.U./dL
pH, UA: 6 (ref 5.0–8.0)

## 2022-02-25 MED ORDER — METHOCARBAMOL 500 MG PO TABS: 500.0000 mg | ORAL_TABLET | Freq: Every day | ORAL | 0 refills | Status: DC

## 2022-02-25 MED ORDER — ALPRAZOLAM 0.5 MG PO TABS: 0.5000 mg | ORAL_TABLET | Freq: Two times a day (BID) | ORAL | 0 refills | Status: DC | PRN

## 2022-02-25 NOTE — Progress Notes (Signed)
Date:  02/25/2022   Name:  Henry Duncan   DOB:  06/29/1990   MRN:  [id number]   Chief Complaint: Back Pain (Hurting on lower L) side- no problems with urinating and hasn't lifted anything heavy that he knows of) and Anxiety (Wants refill on alprazolam)  Back Pain This is a new problem. Episode onset: about 2 weeks ago. The problem occurs daily. The problem has been gradually improving since onset. The pain is present in the thoracic spine (on left side). The pain does not radiate. The pain is mild. Pertinent negatives include no bladder incontinence, bowel incontinence, chest pain, dysuria, fever, numbness or weakness. Treatments tried: tylenol. The treatment provided mild relief.  Anxiety Presents for follow-up visit. Symptoms include insomnia, nervous/anxious behavior, palpitations and panic. Patient reports no chest pain or shortness of breath. Episode frequency: about 3-4 times per week. The severity of symptoms is causing significant distress.   Compliance with medications: Xanax PRN.    Lab Results  Component Value Date   NA 141 09/30/2021   K 4.4 09/30/2021   CO2 19 (L) 09/30/2021   GLUCOSE 99 09/30/2021   BUN 12 09/30/2021   CREATININE 0.68 (L) 09/30/2021   CALCIUM 9.6 09/30/2021   EGFR 128 09/30/2021   GFRNONAA >60 05/23/2021   Lab Results  Component Value Date   CHOL 189 12/18/2021   HDL 41 12/18/2021   LDLCALC 135 (H) 12/18/2021   TRIG 66 12/18/2021   CHOLHDL 4.6 12/18/2021   Lab Results  Component Value Date   TSH 0.732 09/30/2021   Lab Results  Component Value Date   HGBA1C 5.5 06/17/2021   Lab Results  Component Value Date   WBC 9.1 09/30/2021   HGB 16.2 09/30/2021   HCT 47.5 09/30/2021   MCV 91 09/30/2021   PLT 216 09/30/2021   Lab Results  Component Value Date   ALT 35 12/18/2021   AST 29 12/18/2021   ALKPHOS 50 12/18/2021   BILITOT 0.9 12/18/2021   No results found for: "25OHVITD2", "25OHVITD3", "VD25OH"   Review of Systems   Constitutional:  Negative for chills, fatigue and fever.  Respiratory:  Negative for chest tightness and shortness of breath.   Cardiovascular:  Positive for palpitations. Negative for chest pain.  Gastrointestinal:  Negative for bowel incontinence and constipation.  Genitourinary:  Negative for bladder incontinence and dysuria.  Musculoskeletal:  Positive for back pain.  Neurological:  Negative for weakness and numbness.  Psychiatric/Behavioral:  The patient is nervous/anxious and has insomnia.     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   Neoplasm of uncertain behavior of skin 06/25/2021   Generalized anxiety disorder 06/25/2021   Abnormal cardiac CT angiography    Coronary artery disease involving native coronary artery of native heart    Warfarin anticoagulation 03/15/2019   Encounter for therapeutic drug monitoring 09/29/2018   Aortic valve prosthesis present 09/29/2018   S/P ascending aortic aneurysm repair 09/19/2018   Allergic rhinitis 09/06/2018   Hepatic steatosis 10/30/2016   Insomnia 11/29/2015   Migraine 11/29/2015    No Known Allergies  Past Surgical History:  Procedure Laterality Date   BENTALL PROCEDURE N/A 09/19/2018   Procedure: BENTALL PROCEDURE WITH [place].;  Surgeon: Gaye Pollack, MD;  Location: [facility] OR;  Service: Open Heart Surgery;  Laterality: N/A;   CARDIAC SURGERY     CLEFT LIP REPAIR     CORONARY  ANGIOGRAPHY Left 06/13/2021   Procedure: CORONARY ANGIOGRAPHY (CATH LAB);  Surgeon: Wellington Hampshire, MD;  Location: [facility];  Service: Cardiovascular;  Laterality: Left;   CORONARY PRESSURE WIRE/FFR WITH 3D MAPPING N/A 06/13/2021   Procedure: Coronary Pressure Wire, IFR;  Surgeon: Wellington Hampshire, MD;  Location: [facility];  Service: Cardiovascular;  Laterality: N/A;   SUBXYPHOID PERICARDIAL WINDOW N/A 09/26/2018   Procedure: SUBXYPHOID  PERICARDIAL WINDOW;  Surgeon: Gaye Pollack, MD;  Location: [facility] OR;  Service: Thoracic;  Laterality: N/A;   TEE WITHOUT CARDIOVERSION N/A 09/19/2018   Procedure: TRANSESOPHAGEAL ECHOCARDIOGRAM (TEE);  Surgeon: Gaye Pollack, MD;  Location: [facility];  Service: Open Heart Surgery;  Laterality: N/A;    Social History   Tobacco Use   Smoking status: Never   Smokeless tobacco: Never  Vaping Use   Vaping Use: Former  Substance Use Topics   Alcohol use: Yes   Drug use: Never     Medication list has been reviewed and updated.  Current Meds  Medication Sig   ALPRAZolam (XANAX) 0.5 MG tablet Take 1 tablet (0.5 mg total) by mouth 2 (two) times daily as needed for anxiety.   atorvastatin (LIPITOR) 80 MG tablet Take 1 tablet (80 mg total) by mouth daily.   busPIRone (BUSPAR) 5 MG tablet TAKE ONE TABLET BY MOUTH TWICE A DAY   cetirizine (ZYRTEC) 10 MG tablet Take 10 mg by mouth daily as needed for allergies.   clobetasol cream (TEMOVATE) 9.83 % Apply 1 application topically 2 (two) times daily. To external ear canals   fenofibrate (TRICOR) 145 MG tablet Take 1 tablet (145 mg total) by mouth daily.   isosorbide mononitrate (IMDUR) 30 MG 24 hr tablet Take 1 tablet (30 mg total) by mouth daily.   metoprolol succinate (TOPROL-XL) 50 MG 24 hr tablet Take 1 1/2 tabs (65m) by mouth twice a day   warfarin (COUMADIN) 5 MG tablet Take 1 tablet to 1.5 tablets by mouth daily or as directed by the Anticoagulation Clinic.       02/25/2022    3:57 PM 01/06/2022   11:27 AM 09/30/2021   10:51 AM 06/25/2021    3:56 PM  GAD 7 : Generalized Anxiety Score  Nervous, Anxious, on Edge 1 1 0 1  Control/stop worrying 0 _0 Worry too much - different things 0 2 0 1  Trouble relaxing 0 1 0 0  Restless 0 1 0 1  Easily annoyed or irritable 0 _1 Afraid - awful might happen 0 2 0 2  Total GAD 7 Score _2 Anxiety Difficulty Not difficult at all Somewhat difficult Not difficult at all Not difficult at all        02/25/2022    3:56 PM 01/06/2022   11:27 AM 09/30/2021   10:51 AM  Depression screen PHQ 2/9  Decreased Interest 0 1 0  Down, Depressed, Hopeless 0 1 0  PHQ - 2 Score 0 2 0  Altered sleeping 0 0 0  Tired, decreased energy 0 0 0  Change in appetite 0 0 0  Feeling bad or failure about yourself  0 0 0  Trouble concentrating 0 0 0  Moving slowly or fidgety/restless 0 0 0  Suicidal thoughts 0 0 0  PHQ-9 Score 0 2 0  Difficult doing work/chores Not difficult at all Not difficult at all Not difficult at all    BP Readings from Last 3  Encounters:  02/25/22 120/70  01/06/22 102/82  12/02/21 100/68    Physical Exam Constitutional:      Appearance: Normal appearance. He is not ill-appearing.  Cardiovascular:     Rate and Rhythm: Normal rate and regular rhythm.     Heart sounds: Murmur (mechanical valve sound) heard.  Pulmonary:     Effort: Pulmonary effort is normal.     Breath sounds: Normal breath sounds. No wheezing or rhonchi.  Abdominal:     General: Abdomen is flat.     Palpations: Abdomen is soft.     Hernia: A hernia (tiny umbilical herna) is present.  Musculoskeletal:     Thoracic back: Spasms present. No tenderness or bony tenderness. Normal range of motion.     Lumbar back: Negative right straight leg raise test and negative left straight leg raise test.       Back:  Skin:    General: Skin is warm and dry.  Neurological:     Mental Status: He is alert.     Wt Readings from Last 3 Encounters:  02/25/22 180 lb (81.6 kg)  01/06/22 185 lb (83.9 kg)  12/02/21 189 lb 3.2 oz (85.8 kg)    BP 120/70   Pulse 70   Ht _0  (1.702 m)   Wt 180 lb (81.6 kg)   SpO2 96%   BMI 28.19 kg/m   Assessment and Plan: 1. Acute left-sided thoracic back pain Muscle spasm from moving furniture in anticipation of his sons birth. Recommend continued Tylenol tid; add heat and Robaxin at bedtime Good body mechanics for lifting and carrying discussed - POCT urinalysis  dipstick - negative - methocarbamol (ROBAXIN) 500 MG tablet; Take 1 tablet (500 mg total) by mouth at bedtime.  Dispense: 30 tablet; Refill: 0  2. Generalized anxiety disorder Doing well with Xanax about every other day. Will increase the Rx to 15 to last one month. - ALPRAZolam (XANAX) 0.5 MG tablet; Take 1 tablet (0.5 mg total) by mouth 2 (two) times daily as needed for anxiety.  Dispense: 15 tablet; Refill: 0   Partially dictated using Editor, commissioning. Any errors are unintentional.  Halina Maidens, MD [facility]  02/25/2022

## 2022-02-26 ENCOUNTER — Other Ambulatory Visit: Payer: Self-pay

## 2022-02-26 MED ORDER — FENOFIBRATE 145 MG PO TABS: 145.0000 mg | ORAL_TABLET | Freq: Every day | ORAL | 3 refills | Status: DC

## 2022-03-05 DIAGNOSIS — M546 Pain in thoracic spine: Secondary | ICD-10-CM | POA: Diagnosis not present

## 2022-03-11 ENCOUNTER — Other Ambulatory Visit: Payer: Self-pay | Admitting: *Deleted

## 2022-03-11 ENCOUNTER — Other Ambulatory Visit: Payer: Self-pay | Admitting: Internal Medicine

## 2022-03-11 DIAGNOSIS — I2510 Atherosclerotic heart disease of native coronary artery without angina pectoris: Secondary | ICD-10-CM

## 2022-03-11 DIAGNOSIS — F411 Generalized anxiety disorder: Secondary | ICD-10-CM

## 2022-03-11 MED ORDER — METOPROLOL SUCCINATE ER 50 MG PO TB24: ORAL_TABLET | ORAL | 0 refills | Status: DC

## 2022-03-11 MED ORDER — ISOSORBIDE MONONITRATE ER 30 MG PO TB24: 30.0000 mg | ORAL_TABLET | Freq: Every day | ORAL | 0 refills | Status: DC

## 2022-03-11 NOTE — Telephone Encounter (Signed)
last RF 12/03/21 #60 3 RF should have enough to last until 04/05/22  Requested Prescriptions  Refused Prescriptions Disp Refills  . busPIRone (BUSPAR) 5 MG tablet [Pharmacy Med Name: BUSPIRONE 5 MG TAB[*]] 60 tablet 3    Sig: TAKE ONE TABLET BY MOUTH TWICE A DAY     Psychiatry: Anxiolytics/Hypnotics - Non-controlled Passed - 03/11/2022  2:27 AM      Passed - Valid encounter within last 12 months    Recent Outpatient Visits          2 weeks ago Acute left-sided thoracic back pain   [place] [facility] at [facility], Jesse Sans, MD   2 months ago Generalized anxiety disorder   [place] [facility] at [facility], Jesse Sans, MD   5 months ago Annual physical exam   [facility] at [facility], Jesse Sans, MD   8 months ago Generalized anxiety disorder   [place] [facility] at [facility], Jesse Sans, MD   1 year ago Annual physical exam   [facility] at [facility], Jesse Sans, MD      Future Appointments            In 1 week Ag[place], Aaron Edelman, MD [place]. [place]   In 6 months Army Melia, Jesse Sans, MD [place] [facility] at [facility]

## 2022-03-19 ENCOUNTER — Encounter: Payer: Self-pay | Admitting: Cardiology

## 2022-03-19 ENCOUNTER — Ambulatory Visit: Payer: BC Managed Care – PPO | Attending: Cardiology | Admitting: Cardiology

## 2022-03-19 VITALS — BP 116/62 | HR 61 | Ht 67.0 in | Wt 178.4 lb

## 2022-03-19 DIAGNOSIS — Z952 Presence of prosthetic heart valve: Secondary | ICD-10-CM | POA: Diagnosis not present

## 2022-03-19 DIAGNOSIS — I2510 Atherosclerotic heart disease of native coronary artery without angina pectoris: Secondary | ICD-10-CM

## 2022-03-19 DIAGNOSIS — Z9889 Other specified postprocedural states: Secondary | ICD-10-CM | POA: Diagnosis not present

## 2022-03-19 DIAGNOSIS — E782 Mixed hyperlipidemia: Secondary | ICD-10-CM

## 2022-03-19 DIAGNOSIS — Z8679 Personal history of other diseases of the circulatory system: Secondary | ICD-10-CM

## 2022-03-19 NOTE — Patient Instructions (Signed)
Medication Instructions:   Your physician recommends that you continue on your current medications as directed. Please refer to the Current Medication list given to you today.   *If you need a refill on your cardiac medications before your next appointment, please call your pharmacy*   Lab Work:  Your physician recommends that you return for a BMP and a FASTING lipid profile: Prior to your CT scan.  - You will need to be fasting. Please do not have anything to eat or drink after midnight the morning you have the lab work. You may only have water or black coffee with no cream or sugar.   - Please go to the [facility]. You will check in at the front desk to the right as you walk into the atrium. Valet Parking is offered if needed. - No appointment needed. You may go any day between 7 am and 6 pm.     Testing/Procedures:  Your physician has requested that you have an echocardiogram. Echocardiography is a painless test that uses sound waves to create images of your heart. It provides your doctor with information about the size and shape of your heart and how well your heart's chambers and valves are working. This procedure takes approximately one hour. There are no restrictions for this procedure. Please do NOT wear cologne, perfume, aftershave, or lotions (deodorant is allowed). Please arrive 15 minutes prior to your appointment time.  Non-Cardiac CT Angiography (CTA), is a special type of CT scan that uses a computer to produce multi-dimensional views of major blood vessels throughout the body. In CT angiography, a contrast material is injected through an IV to help visualize the blood vessels   Pre-procedure instructions:  You may take your regular medications unless instructed otherwise by your provider.  No solid foods 4 hours prior. You may have liquids ONLY 4 hours prior to procedure.   Please call [phone] to schedule this test.    Follow-Up: At [facility], you and your health needs are our priority.  As part of our continuing mission to provide you with exceptional heart care, we have created designated Provider Care Teams.  These Care Teams include your primary Cardiologist (physician) and Advanced Practice Providers (APPs -  Physician Assistants and Nurse Practitioners) who all work together to provide you with the care you need, when you need it.  We recommend signing up for the patient portal called "MyChart".  Sign up information is provided on this After Visit Summary.  MyChart is used to connect with patients for Virtual Visits (Telemedicine).  Patients are able to view lab/test results, encounter notes, upcoming appointments, etc.  Non-urgent messages can be sent to your provider as well.   To learn more about what you can do with MyChart, go to [email or web address].    Your next appointment:   Follow up after testing   The format for your next appointment:   In Person  Provider:   You may see Kate Sable, MDor one of the following Advanced Practice Providers on your designated Care Team:   Murray Hodgkins, NP Christell Faith, PA-C Cadence Kathlen Mody, PA-C Gerrie Nordmann, NP    Other Instructions   Important Information About Sugar

## 2022-03-19 NOTE — Progress Notes (Signed)
Cardiology Office Note:    Date:  03/19/2022   ID:  Henry Duncan, DOB 1990-10-23, MRN [id number]  PCP:  Glean Hess, MD  [facility] Cardiologist:  Kate Sable, MD  [facility] Electrophysiologist:  None   Referring MD: Glean Hess, MD   Chief Complaint  Patient presents with   Follow-up    3 month follow up, chest pain off and on, some arm pains    History of Present Illness:    Henry Duncan is a 31 y.o. male with a hx of hypertension, CAD (lhc 05/2021-50% mid LAD), aortic aneurysm, bicuspid aortic valve, ascending thoracic aorta dissection s/p Bentall procedure (with 23 mm [employer] mechanical valve, developed pericardial effusion postoperatively and underwent pericardial window 08/2018), mixed hyperlipidemia who presents for follow-up.  Being seen for stable angina, improved overall since starting Imdur.  Also has history of hiatal hernia, plans to follow-up with PCP as this might be contributing to symptoms of chest pressure.  Overall he states doing okay, no new issues.  Takes fenofibrate as prescribed.  No adverse effects on any medications.   Prior notes LHC 05/2021 mid LAD 50%.  IFFR 0.95   CCTA 05/2021 severe mid LAD stenosis, calcium score 958.  FFRct did not show significant stenosis TTE 03/2020 EF 55 to 60%, normal functioning prosthetic valve.  Patient presented to the hospital on 09/13/2018 due to chest pain.  CT did reveal ascending thoracic aorta aneurysm measuring 5.7 to 5.8 cm, localized partial dissection of the anterior middle ascending thoracic aorta.  He was subsequently taken for surgery and underwent Bentall procedure.  Last transthoracic echocardiogram 09/16/2018 showed normal systolic and diastolic function, EF 60 to 65%.  Intraoperative TEE on 09/19/2018 showed normal bioprosthetic valve with normal washing jets, transaortic gradient 7 mmHg.  Coronary CTA 2020, calcium score 476, moderate mid LAD disease   Past  Medical History:  Diagnosis Date   Allergy    Anxiety    Aortic aneurysm ([place]) 09/16/2018   a. 08/2018 Asc Ao/root aneuryms w/ dissection-->s/p Bentall w/ 76m Hemashield graft, 253mSJM AVR, and reimplantation of cor arteries; b. 04/2021 CTA Chest: stable post-op changes.   CAD (coronary artery disease)    a. 08/2018 Cor CTA: Ca2+ 99th%'ile (476). Calcified plaque in mLAD. Nl LM, LCX, RCA. S/p reimplantation of coronary arteries in setting of Bentall; b. 05/2021 Cor CTA: LAD >7038ml FFRct); c. 05/2021 Cath: LM nl, LAD 40p, 19m37mX/OM1 min irregs, RCA nl->Med rx.   H/O mechanical aortic valve replacement    a. 08/2018 in setting of bicuspid AoV and Asc Ao dissection-->23mm30m mech AVR; b. 03/2020 Echo: EF 55-60%. No rwma. Mild LVH. Nl RV fxn. Nl functioning AVR.   Hyperlipidemia    Hypertension    Pericardial effusion    a. 08/2018 post-op -->s/p window.    Past Surgical History:  Procedure Laterality Date   BENTALL PROCEDURE N/A 09/19/2018   Procedure: BENTALL PROCEDURE WITH [address] [place]: BartlGaye Pollack  [facility];  Laterality: N/A;   CARDIAC SURGERY     CLEFT LIP REPAIR     CORONARY ANGIOGRAPHY Left 06/13/2021   Procedure: CORONARY ANGIOGRAPHY (CATH LAB);  Surgeon: AridaWellington Hampshire  [facility];  Service: Cardiovascular;  Laterality: Left;   CORONARY PRESSURE WIRE/FFR WITH 3D MAPPING N/A 06/13/2021   Procedure: Coronary Pressure Wire, IFR;  Surgeon: AridaWellington Hampshire  Location:  [place] CV LAB;  Service: Cardiovascular;  Laterality: N/A;   SUBXYPHOID PERICARDIAL WINDOW N/A 09/26/2018   Procedure: SUBXYPHOID PERICARDIAL WINDOW;  Surgeon: Gaye Pollack, MD;  Location: [facility];  Service: Thoracic;  Laterality: N/A;   TEE WITHOUT CARDIOVERSION N/A 09/19/2018   Procedure: TRANSESOPHAGEAL ECHOCARDIOGRAM (TEE);  Surgeon: Gaye Pollack, MD;  Location: [place];  Service: Open Heart Surgery;  Laterality:  N/A;    Current Medications: Current Meds  Medication Sig   ALPRAZolam (XANAX) 0.5 MG tablet Take 1 tablet (0.5 mg total) by mouth 2 (two) times daily as needed for anxiety.   atorvastatin (LIPITOR) 80 MG tablet Take 1 tablet (80 mg total) by mouth daily.   busPIRone (BUSPAR) 5 MG tablet TAKE ONE TABLET BY MOUTH TWICE A DAY   cetirizine (ZYRTEC) 10 MG tablet Take 10 mg by mouth daily as needed for allergies.   clobetasol cream (TEMOVATE) 1.19 % Apply 1 application topically 2 (two) times daily. To external ear canals   fenofibrate (TRICOR) 145 MG tablet Take 1 tablet (145 mg total) by mouth daily.   isosorbide mononitrate (IMDUR) 30 MG 24 hr tablet Take 1 tablet (30 mg total) by mouth daily.   methocarbamol (ROBAXIN) 500 MG tablet Take 1 tablet (500 mg total) by mouth at bedtime.   metoprolol succinate (TOPROL-XL) 50 MG 24 hr tablet Take 1 1/2 tabs ('75mg'$ ) by mouth twice a day   warfarin (COUMADIN) 5 MG tablet Take 1 tablet to 1.5 tablets by mouth daily or as directed by the Anticoagulation Clinic.   ZORYVE 0.3 % CREA SMARTSIG:sparingly Topical Daily     Allergies:   Patient has no known allergies.   Social History   Socioeconomic History   Marital status: Single    Spouse name: Not on file   Number of children: 0   Years of education: Not on file   Highest education level: Not on file  Occupational History   Not on file  Tobacco Use   Smoking status: Never   Smokeless tobacco: Never  Vaping Use   Vaping Use: Former  Substance and Sexual Activity   Alcohol use: Yes   Drug use: Never   Sexual activity: Yes    Partners: Female    Birth control/protection: Condom  Other Topics Concern   Not on file  Social History Narrative   Not on file   Social Determinants of Health   Financial Resource Strain: Low Risk  (09/30/2021)   Overall Financial Resource Strain (CARDIA)    Difficulty of Paying Living Expenses: Not hard at all  Food Insecurity: No Food Insecurity (09/30/2021)    Hunger Vital Sign    Worried About Running Out of Food in the Last Year: Never true    [place] in the Last Year: Never true  Transportation Needs: No Transportation Needs (09/30/2021)   PRAPARE - Hydrologist (Medical): No    Lack of Transportation (Non-Medical): No  Physical Activity: Not on file  Stress: Not on file  Social Connections: Not on file     Family History: The patient's family history includes Anxiety disorder in his brother and father; Depression in his brother; Diabetes in his father; Heart attack in his mother.  ROS:   Please see the history of present illness.     All other systems reviewed and are negative.  EKGs/Labs/Other Studies Reviewed:    The following studies were reviewed today:   EKG:  EKG is  ordered today.  EKG shows normal sinus rhythm, normal ECG.  Recent Labs: 09/30/2021: BUN 12; Creatinine, Ser 0.68; Hemoglobin 16.2; Platelets 216; Potassium 4.4; Sodium 141; TSH 0.732 12/18/2021: ALT 35  Recent Lipid Panel    Component Value Date/Time   CHOL 189 12/18/2021 0730   CHOL 211 (H) 09/30/2021 1118   TRIG 66 12/18/2021 0730   HDL 41 12/18/2021 0730   HDL 46 09/30/2021 1118   CHOLHDL 4.6 12/18/2021 0730   VLDL 13 12/18/2021 0730   LDLCALC 135 (H) 12/18/2021 0730   LDLCALC 133 (H) 09/30/2021 1118     Risk Assessment/Calculations:      Physical Exam:    VS:  BP 116/62 (BP Location: Left Arm, Patient Position: Sitting, Cuff Size: Normal)   Pulse 61   Ht '5\' 7"'$  (1.702 m)   Wt 178 lb 6.4 oz (80.9 kg)   SpO2 99%   BMI 27.94 kg/m     Wt Readings from Last 3 Encounters:  03/19/22 178 lb 6.4 oz (80.9 kg)  02/25/22 180 lb (81.6 kg)  01/06/22 185 lb (83.9 kg)     GEN:  Well nourished, well developed in no acute distress HEENT: Normal NECK: No JVD; No carotid bruits CARDIAC: RRR, 2/6 systolic murmur, mechanical click consistent with mechanical valve noted. RESPIRATORY:  Clear to auscultation without rales,  wheezing or rhonchi  ABDOMEN: Soft, non-tender, non-distended MUSCULOSKELETAL:  No edema; No deformity  SKIN: Warm and dry NEUROLOGIC:  Alert and oriented x 3 PSYCHIATRIC:  Normal affect   ASSESSMENT:    1. Coronary artery disease involving native coronary artery of native heart, unspecified whether angina present   2. S/P ascending aortic aneurysm repair   3. H/O mechanical aortic valve replacement   4. Mixed hyperlipidemia      PLAN:    In order of problems listed above:  CAD, LHC 05/2021 mid LAD 50%.  IFFR 0.95.  Stable angina.  Continue Imdur 30 mg daily. Cont Toprol-XL, Lipitor, warfarin.  aortic root aneurysm, dissection, s/p Bentall procedure with aortic root replacement and mechanical aortic valve (08/2018). Last CT showed normal aortic graft with no postop complications.  Obtain follow-up CT angio to evaluate aortic root size. Continue Toprol xl 75 mg twice daily. History of mechanical aortic valve replacement, keep appointments with Coumadin clinic for frequent INR checks.  Obtain follow-up echo. hyperlipidemia, triglycerides elevated, LDL elevated.  Cont Lipitor  80 mg daily, fenofibrate.  Repeat fasting lipid profile  Follow-up in 6 months  Total encounter time 40 minutes  Greater than 50% was spent in counseling and coordination of care with the patient   Medication Adjustments/Labs and Tests Ordered: Current medicines are reviewed at length with the patient today.  Concerns regarding medicines are outlined above.  Orders Placed This Encounter  Procedures   CT ANGIO CHEST AORTA W/CM & OR WO/CM   Basic metabolic panel   Lipid panel   EKG 12-Lead   ECHOCARDIOGRAM COMPLETE   No orders of the defined types were placed in this encounter.   Patient Instructions  Medication Instructions:   Your physician recommends that you continue on your current medications as directed. Please refer to the Current Medication list given to you today.   *If you need a refill on  your cardiac medications before your next appointment, please call your pharmacy*   Lab Work:  Your physician recommends that you return for a BMP and a FASTING lipid profile: Prior to your CT scan.  - You will need to  be fasting. Please do not have anything to eat or drink after midnight the morning you have the lab work. You may only have water or black coffee with no cream or sugar.   - Please go to the [facility]. You will check in at the front desk to the right as you walk into the atrium. Valet Parking is offered if needed. - No appointment needed. You may go any day between 7 am and 6 pm.     Testing/Procedures:  Your physician has requested that you have an echocardiogram. Echocardiography is a painless test that uses sound waves to create images of your heart. It provides your doctor with information about the size and shape of your heart and how well your heart's chambers and valves are working. This procedure takes approximately one hour. There are no restrictions for this procedure. Please do NOT wear cologne, perfume, aftershave, or lotions (deodorant is allowed). Please arrive 15 minutes prior to your appointment time.  Non-Cardiac CT Angiography (CTA), is a special type of CT scan that uses a computer to produce multi-dimensional views of major blood vessels throughout the body. In CT angiography, a contrast material is injected through an IV to help visualize the blood vessels   Pre-procedure instructions:  You may take your regular medications unless instructed otherwise by your provider.  No solid foods 4 hours prior. You may have liquids ONLY 4 hours prior to procedure.   Please call [phone] to schedule this test.    Follow-Up: At [facility], you and your health needs are our priority.  As part of our continuing mission to provide you with exceptional heart care, we have created designated Provider Care Teams.  These Care Teams include your  primary Cardiologist (physician) and Advanced Practice Providers (APPs -  Physician Assistants and Nurse Practitioners) who all work together to provide you with the care you need, when you need it.  We recommend signing up for the patient portal called "MyChart".  Sign up information is provided on this After Visit Summary.  MyChart is used to connect with patients for Virtual Visits (Telemedicine).  Patients are able to view lab/test results, encounter notes, upcoming appointments, etc.  Non-urgent messages can be sent to your provider as well.   To learn more about what you can do with MyChart, go to [email or web address].    Your next appointment:   Follow up after testing   The format for your next appointment:   In Person  Provider:   You may see Kate Sable, MDor one of the following Advanced Practice Providers on your designated Care Team:   Murray Hodgkins, NP Christell Faith, PA-C Cadence Kathlen Mody, PA-C Gerrie Nordmann, NP    Other Instructions   Important Information About Sugar         Signed, Kate Sable, MD  03/19/2022 10:15 AM    [place]

## 2022-03-23 ENCOUNTER — Other Ambulatory Visit: Payer: Self-pay | Admitting: Internal Medicine

## 2022-03-23 ENCOUNTER — Encounter: Payer: Self-pay | Admitting: Internal Medicine

## 2022-03-23 DIAGNOSIS — F411 Generalized anxiety disorder: Secondary | ICD-10-CM

## 2022-03-23 MED ORDER — ALPRAZOLAM 0.5 MG PO TABS: 0.5000 mg | ORAL_TABLET | Freq: Two times a day (BID) | ORAL | 0 refills | Status: DC | PRN

## 2022-03-23 NOTE — Telephone Encounter (Signed)
Please review. Last office visit 03/17/2022.  KP

## 2022-03-30 ENCOUNTER — Encounter: Payer: Self-pay | Admitting: Cardiology

## 2022-03-30 ENCOUNTER — Other Ambulatory Visit: Payer: Self-pay

## 2022-03-30 ENCOUNTER — Ambulatory Visit: Payer: BC Managed Care – PPO

## 2022-03-30 DIAGNOSIS — Z8679 Personal history of other diseases of the circulatory system: Secondary | ICD-10-CM

## 2022-04-01 ENCOUNTER — Ambulatory Visit: Payer: BC Managed Care – PPO | Attending: Cardiology

## 2022-04-01 DIAGNOSIS — Z5181 Encounter for therapeutic drug level monitoring: Secondary | ICD-10-CM | POA: Diagnosis not present

## 2022-04-01 DIAGNOSIS — Z952 Presence of prosthetic heart valve: Secondary | ICD-10-CM

## 2022-04-01 LAB — POCT INR: INR: 2.6 (ref 2.0–3.0)

## 2022-04-01 NOTE — Patient Instructions (Signed)
-   Continue 1.5 tablets every day, except 1 tablet Monday and Friday - recheck INR in 6 weeks

## 2022-04-03 ENCOUNTER — Encounter: Payer: Self-pay | Admitting: Internal Medicine

## 2022-04-03 ENCOUNTER — Other Ambulatory Visit: Payer: Self-pay | Admitting: Internal Medicine

## 2022-04-03 ENCOUNTER — Encounter: Payer: Self-pay | Admitting: Cardiology

## 2022-04-03 DIAGNOSIS — Z792 Long term (current) use of antibiotics: Secondary | ICD-10-CM

## 2022-04-03 MED ORDER — AMOXICILLIN 500 MG PO CAPS: 2000.0000 mg | ORAL_CAPSULE | Freq: Once | ORAL | 0 refills | Status: AC

## 2022-04-05 ENCOUNTER — Other Ambulatory Visit: Payer: Self-pay | Admitting: Internal Medicine

## 2022-04-05 DIAGNOSIS — F411 Generalized anxiety disorder: Secondary | ICD-10-CM

## 2022-04-06 NOTE — Telephone Encounter (Signed)
Requested Prescriptions  Pending Prescriptions Disp Refills   busPIRone (BUSPAR) 5 MG tablet [Pharmacy Med Name: BUSPIRONE 5 MG TAB[*]] 180 tablet 3    Sig: TAKE ONE TABLET BY MOUTH TWICE A DAY     Psychiatry: Anxiolytics/Hypnotics - Non-controlled Passed - 04/05/2022  9:51 AM      Passed - Valid encounter within last 12 months    Recent Outpatient Visits           1 month ago Acute left-sided thoracic back pain   [place] [facility] at [facility], Jesse Sans, MD   3 months ago Generalized anxiety disorder   [place] [facility] at [facility], Jesse Sans, MD   6 months ago Annual physical exam   [facility] at [facility], Jesse Sans, MD   9 months ago Generalized anxiety disorder   [place] [facility] at [facility], Jesse Sans, MD   1 year ago Annual physical exam   [facility] at [facility], Jesse Sans, MD       Future Appointments             In 1 month Agbor-Etang, Aaron Edelman, MD [place]. [place]   In 6 months Army Melia, Jesse Sans, MD [place] [facility] at [facility], [facility]

## 2022-04-08 DIAGNOSIS — L400 Psoriasis vulgaris: Secondary | ICD-10-CM | POA: Diagnosis not present

## 2022-04-08 DIAGNOSIS — Z79899 Other long term (current) drug therapy: Secondary | ICD-10-CM | POA: Diagnosis not present

## 2022-04-08 DIAGNOSIS — L408 Other psoriasis: Secondary | ICD-10-CM | POA: Diagnosis not present

## 2022-04-10 ENCOUNTER — Other Ambulatory Visit: Payer: Self-pay

## 2022-04-10 DIAGNOSIS — I2510 Atherosclerotic heart disease of native coronary artery without angina pectoris: Secondary | ICD-10-CM

## 2022-04-10 MED ORDER — ISOSORBIDE MONONITRATE ER 30 MG PO TB24: 30.0000 mg | ORAL_TABLET | Freq: Every day | ORAL | 3 refills | Status: DC

## 2022-04-14 ENCOUNTER — Encounter: Payer: Self-pay | Admitting: Internal Medicine

## 2022-04-14 DIAGNOSIS — L400 Psoriasis vulgaris: Secondary | ICD-10-CM | POA: Insufficient documentation

## 2022-04-15 ENCOUNTER — Ambulatory Visit
Admission: RE | Admit: 2022-04-15 | Discharge: 2022-04-15 | Disposition: A | Discharge status: Home / Self Care | Payer: BC Managed Care – PPO | Source: Ambulatory Visit | Attending: Cardiology | Admitting: Cardiology

## 2022-04-15 ENCOUNTER — Other Ambulatory Visit: Payer: Self-pay | Admitting: Internal Medicine

## 2022-04-15 ENCOUNTER — Other Ambulatory Visit
Admission: RE | Admit: 2022-04-15 | Discharge: 2022-04-15 | Disposition: A | Discharge status: Home / Self Care | Payer: BC Managed Care – PPO | Source: Ambulatory Visit | Attending: Cardiology | Admitting: Cardiology

## 2022-04-15 ENCOUNTER — Other Ambulatory Visit: Payer: Self-pay

## 2022-04-15 DIAGNOSIS — E782 Mixed hyperlipidemia: Secondary | ICD-10-CM

## 2022-04-15 DIAGNOSIS — Z8679 Personal history of other diseases of the circulatory system: Secondary | ICD-10-CM | POA: Diagnosis not present

## 2022-04-15 DIAGNOSIS — I517 Cardiomegaly: Secondary | ICD-10-CM | POA: Diagnosis not present

## 2022-04-15 DIAGNOSIS — Z952 Presence of prosthetic heart valve: Secondary | ICD-10-CM | POA: Diagnosis not present

## 2022-04-15 DIAGNOSIS — I359 Nonrheumatic aortic valve disorder, unspecified: Secondary | ICD-10-CM | POA: Diagnosis not present

## 2022-04-15 DIAGNOSIS — I712 Thoracic aortic aneurysm, without rupture, unspecified: Secondary | ICD-10-CM | POA: Diagnosis not present

## 2022-04-15 DIAGNOSIS — Z9889 Other specified postprocedural states: Secondary | ICD-10-CM | POA: Diagnosis not present

## 2022-04-15 DIAGNOSIS — G4733 Obstructive sleep apnea (adult) (pediatric): Secondary | ICD-10-CM | POA: Insufficient documentation

## 2022-04-15 LAB — BASIC METABOLIC PANEL
Anion gap: 6 (ref 5–15)
BUN: 16 mg/dL (ref 6–20)
CO2: 25 mmol/L (ref 22–32)
Calcium: 9.1 mg/dL (ref 8.9–10.3)
Chloride: 106 mmol/L (ref 98–111)
Creatinine, Ser: 0.62 mg/dL (ref 0.61–1.24)
GFR, Estimated: >60 mL/min (ref >60)
Glucose, Bld: 101 mg/dL — ABNORMAL HIGH (ref 70–99)
Potassium: 4.2 mmol/L (ref 3.5–5.1)
Sodium: 137 mmol/L (ref 135–145)

## 2022-04-15 LAB — LIPID PANEL
Cholesterol: 186 mg/dL (ref 0–200)
HDL: 35 mg/dL — ABNORMAL LOW (ref >40)
LDL Cholesterol: 126 mg/dL — ABNORMAL HIGH (ref 0–99)
Total CHOL/HDL Ratio: 5.3 RATIO
Triglycerides: 127 mg/dL (ref <150)
VLDL: 25 mg/dL (ref 0–40)

## 2022-04-15 MED ORDER — IOHEXOL 350 MG/ML SOLN
75.0000 mL | Freq: Once | INTRAVENOUS | Status: AC | PRN
  Administered 2022-04-15: 75 mL via INTRAVENOUS

## 2022-04-15 NOTE — Telephone Encounter (Signed)
Pt response.  KP

## 2022-04-22 DIAGNOSIS — G4733 Obstructive sleep apnea (adult) (pediatric): Secondary | ICD-10-CM | POA: Diagnosis not present

## 2022-04-23 ENCOUNTER — Ambulatory Visit: Payer: BC Managed Care – PPO | Attending: Cardiology

## 2022-04-23 ENCOUNTER — Other Ambulatory Visit: Payer: Self-pay | Admitting: Internal Medicine

## 2022-04-23 DIAGNOSIS — Z9889 Other specified postprocedural states: Secondary | ICD-10-CM

## 2022-04-23 DIAGNOSIS — Z8679 Personal history of other diseases of the circulatory system: Secondary | ICD-10-CM

## 2022-04-23 DIAGNOSIS — I2510 Atherosclerotic heart disease of native coronary artery without angina pectoris: Secondary | ICD-10-CM

## 2022-04-23 DIAGNOSIS — F411 Generalized anxiety disorder: Secondary | ICD-10-CM

## 2022-04-23 LAB — ECHOCARDIOGRAM COMPLETE
AR max vel: 0.99 cm2
AV Area VTI: 1.16 cm2
AV Area mean vel: 1.13 cm2
AV Mean grad: 14 mmHg
AV Peak grad: 23.2 mmHg
Ao pk vel: 2.41 m/s
Area-P 1/2: 2.72 cm2
Calc EF: 71.1 %
S' Lateral: 2.5 cm
Single Plane A2C EF: 81 %
Single Plane A4C EF: 59.6 %

## 2022-04-23 MED ORDER — ALPRAZOLAM 0.5 MG PO TABS: 0.5000 mg | ORAL_TABLET | Freq: Two times a day (BID) | ORAL | 5 refills | Status: DC | PRN

## 2022-04-23 NOTE — Telephone Encounter (Signed)
Called pt left VM that refill was sent.  KP

## 2022-04-23 NOTE — Telephone Encounter (Signed)
Please review. Last office visit 02/25/2022.  KP

## 2022-04-27 ENCOUNTER — Encounter: Payer: Self-pay | Admitting: Internal Medicine

## 2022-04-28 ENCOUNTER — Other Ambulatory Visit: Payer: Self-pay

## 2022-04-28 DIAGNOSIS — G4733 Obstructive sleep apnea (adult) (pediatric): Secondary | ICD-10-CM

## 2022-04-30 ENCOUNTER — Other Ambulatory Visit: Payer: Self-pay | Admitting: *Deleted

## 2022-04-30 DIAGNOSIS — M25562 Pain in left knee: Secondary | ICD-10-CM | POA: Diagnosis not present

## 2022-04-30 MED ORDER — ATORVASTATIN CALCIUM 80 MG PO TABS: 80.0000 mg | ORAL_TABLET | Freq: Every day | ORAL | 0 refills | Status: DC

## 2022-04-30 MED ORDER — METOPROLOL SUCCINATE ER 50 MG PO TB24: ORAL_TABLET | ORAL | 0 refills | Status: DC

## 2022-05-07 ENCOUNTER — Encounter: Payer: Self-pay | Admitting: Cardiology

## 2022-05-07 ENCOUNTER — Ambulatory Visit: Payer: BC Managed Care – PPO | Attending: Cardiology | Admitting: Cardiology

## 2022-05-07 VITALS — BP 98/76 | HR 66 | Ht 67.0 in | Wt 180.0 lb

## 2022-05-07 DIAGNOSIS — E782 Mixed hyperlipidemia: Secondary | ICD-10-CM

## 2022-05-07 DIAGNOSIS — Z952 Presence of prosthetic heart valve: Secondary | ICD-10-CM | POA: Diagnosis not present

## 2022-05-07 DIAGNOSIS — I712 Thoracic aortic aneurysm, without rupture, unspecified: Secondary | ICD-10-CM

## 2022-05-07 DIAGNOSIS — I2510 Atherosclerotic heart disease of native coronary artery without angina pectoris: Secondary | ICD-10-CM | POA: Diagnosis not present

## 2022-05-07 MED ORDER — NEXLIZET 180-10 MG PO TABS: 1.0000 | ORAL_TABLET | Freq: Every day | ORAL | 0 refills | Status: DC

## 2022-05-07 NOTE — Patient Instructions (Addendum)
Medication Instructions:   START Nexlizet I take one tablet by mouth daily.   *If you need a refill on your cardiac medications before your next appointment, please call your pharmacy*   Lab Work:  Your physician recommends that you return for lab work in: 3 months - before your follow up appt at the medical mall. You will need to be fasting. No appt is needed. Hours are M-F 7AM- 6 PM.  If you have labs (blood work) drawn today and your tests are completely normal, you will receive your results only by: [place] (if you have MyChart) OR A paper copy in the mail If you have any lab test that is abnormal or we need to change your treatment, we will call you to review the results.   Testing/Procedures:  None Ordered   Follow-Up: At [facility], you and your health needs are our priority.  As part of our continuing mission to provide you with exceptional heart care, we have created designated Provider Care Teams.  These Care Teams include your primary Cardiologist (physician) and Advanced Practice Providers (APPs -  Physician Assistants and Nurse Practitioners) who all work together to provide you with the care you need, when you need it.  We recommend signing up for the patient portal called "MyChart".  Sign up information is provided on this After Visit Summary.  MyChart is used to connect with patients for Virtual Visits (Telemedicine).  Patients are able to view lab/test results, encounter notes, upcoming appointments, etc.  Non-urgent messages can be sent to your provider as well.   To learn more about what you can do with MyChart, go to [email or web address].    Your next appointment:   3 month(s)  The format for your next appointment:   In Person  Provider:   You may see Kate Sable, MD or one of the following Advanced Practice Providers on your designated Care Team:   Murray Hodgkins, NP Christell Faith, PA-C Cadence Kathlen Mody, PA-C Gerrie Nordmann, NP

## 2022-05-07 NOTE — Progress Notes (Signed)
Cardiology Office Note:    Date:  05/07/2022   ID:  [name], DOB 01-Feb-1991, MRN [id number]  PCP:  Glean Hess, MD  [facility] Cardiologist:  Kate Sable, MD  [facility] Electrophysiologist:  None   Referring MD: Glean Hess, MD   Chief Complaint  Patient presents with   Follow-up    F/U after cardiac testing    History of Present Illness:    Henry Duncan is a 31 y.o. male with a hx of hypertension, CAD (lhc 05/2021-50% mid LAD), aortic aneurysm, bicuspid aortic valve, ascending thoracic aorta dissection s/p Bentall procedure (with 23 mm [employer] mechanical valve, developed pericardial effusion postoperatively and underwent pericardial window 08/2018), mixed hyperlipidemia who presents for follow-up.  Being seen for aortic root dilatation, CAD.  Feels well, denies chest pain.  Had a recent chest CT to evaluate aortic root size.  Appears stable compared to prior.  Repeat cholesterol showed elevated levels.  Taking high intensity Lipitor, fenofibrate as prescribed.  Feels well, denies any adverse effects from medications.  No bleeding issues with Coumadin.  Prior notes LHC 05/2021 mid LAD 50%.  IFFR 0.95   CCTA 05/2021 severe mid LAD stenosis, calcium score 958.  FFRct did not show significant stenosis TTE 03/2020 EF 55 to 60%, normal functioning prosthetic valve.  Patient presented to the hospital on 09/13/2018 due to chest pain.  CT did reveal ascending thoracic aorta aneurysm measuring 5.7 to 5.8 cm, localized partial dissection of the anterior middle ascending thoracic aorta.  He was subsequently taken for surgery and underwent Bentall procedure.  Last transthoracic echocardiogram 09/16/2018 showed normal systolic and diastolic function, EF 60 to 65%.  Intraoperative TEE on 09/19/2018 showed normal bioprosthetic valve with normal washing jets, transaortic gradient 7 mmHg.  Coronary CTA 2020, calcium score 476, moderate mid LAD  disease   Past Medical History:  Diagnosis Date   Allergy    Anxiety    Aortic aneurysm ([place]) 09/16/2018   a. 08/2018 Asc Ao/root aneuryms w/ dissection-->s/p Bentall w/ 70m Hemashield graft, 268mSJM AVR, and reimplantation of cor arteries; b. 04/2021 CTA Chest: stable post-op changes.   CAD (coronary artery disease)    a. 08/2018 Cor CTA: Ca2+ 99th%'ile (476). Calcified plaque in mLAD. Nl LM, LCX, RCA. S/p reimplantation of coronary arteries in setting of Bentall; b. 05/2021 Cor CTA: LAD >7054ml FFRct); c. 05/2021 Cath: LM nl, LAD 40p, 47m52mX/OM1 min irregs, RCA nl->Med rx.   H/O mechanical aortic valve replacement    a. 08/2018 in setting of bicuspid AoV and Asc Ao dissection-->23mm88m mech AVR; b. 03/2020 Echo: EF 55-60%. No rwma. Mild LVH. Nl RV fxn. Nl functioning AVR.   Hyperlipidemia    Hypertension    Pericardial effusion    a. 08/2018 post-op -->s/p window.    Past Surgical History:  Procedure Laterality Date   BENTALL PROCEDURE N/A 09/19/2018   Procedure: BENTALL PROCEDURE WITH [address] [place]: BartlGaye Pollack  [facility];  Laterality: N/A;   CARDIAC SURGERY     CLEFT LIP REPAIR     CORONARY ANGIOGRAPHY Left 06/13/2021   Procedure: CORONARY ANGIOGRAPHY (CATH LAB);  Surgeon: AridaWellington Hampshire  [facility];  Service: Cardiovascular;  Laterality: Left;   CORONARY PRESSURE WIRE/FFR WITH 3D MAPPING N/A 06/13/2021   Procedure: Coronary Pressure Wire, IFR;  Surgeon: AridaWellington Hampshire  [facility];  Service: Cardiovascular;  Laterality: N/A;   SUBXYPHOID PERICARDIAL WINDOW N/A 09/26/2018   Procedure: SUBXYPHOID PERICARDIAL WINDOW;  Surgeon: Gaye Pollack, MD;  [facility];  Service: Thoracic;  Laterality: N/A;   TEE WITHOUT CARDIOVERSION N/A 09/19/2018   Procedure: TRANSESOPHAGEAL ECHOCARDIOGRAM (TEE);  Surgeon: Gaye Pollack, MD;  Location: [place];  Service: Open  Heart Surgery;  Laterality: N/A;   WISDOM TOOTH EXTRACTION      Current Medications: Current Meds  Medication Sig   ALPRAZolam (XANAX) 0.5 MG tablet Take 1 tablet (0.5 mg total) by mouth 2 (two) times daily as needed for anxiety.   atorvastatin (LIPITOR) 80 MG tablet Take 1 tablet (80 mg total) by mouth daily.   Bempedoic Acid-Ezetimibe (NEXLIZET) 180-10 MG TABS Take 1 tablet by mouth daily.   busPIRone (BUSPAR) 5 MG tablet TAKE ONE TABLET BY MOUTH TWICE A DAY   cetirizine (ZYRTEC) 10 MG tablet Take 10 mg by mouth daily as needed for allergies.   clobetasol cream (TEMOVATE) 7.01 % Apply 1 application topically 2 (two) times daily. To external ear canals   fenofibrate (TRICOR) 145 MG tablet Take 1 tablet (145 mg total) by mouth daily.   isosorbide mononitrate (IMDUR) 30 MG 24 hr tablet Take 1 tablet (30 mg total) by mouth daily.   methocarbamol (ROBAXIN) 500 MG tablet Take 1 tablet (500 mg total) by mouth at bedtime.   metoprolol succinate (TOPROL-XL) 50 MG 24 hr tablet Take 1 1/2 tabs ('75mg'$ ) by mouth twice a day   OTEZLA 30 MG TABS Take 1 tablet by mouth 2 (two) times daily.   warfarin (COUMADIN) 5 MG tablet Take 1 tablet to 1.5 tablets by mouth daily or as directed by the Anticoagulation Clinic.   ZORYVE 0.3 % CREA SMARTSIG:sparingly Topical Daily     Allergies:   Patient has no known allergies.   Social History   Socioeconomic History   Marital status: Married    Spouse name: Not on file   Number of children: 0   Years of education: Not on file   Highest education level: Not on file  Occupational History   Not on file  Tobacco Use   Smoking status: Never   Smokeless tobacco: Never  Vaping Use   Vaping Use: Former  Substance and Sexual Activity   Alcohol use: Yes   Drug use: Never   Sexual activity: Yes    Partners: Female    Birth control/protection: Condom  Other Topics Concern   Not on file  Social History Narrative   Not on file   Social Determinants of Health    Financial Resource Strain: Low Risk  (09/30/2021)   Overall Financial Resource Strain (CARDIA)    Difficulty of Paying Living Expenses: Not hard at all  Food Insecurity: No Food Insecurity (09/30/2021)   Hunger Vital Sign    Worried About Running Out of Food in the Last Year: Never true    [place] in the Last Year: Never true  Transportation Needs: No Transportation Needs (09/30/2021)   PRAPARE - Hydrologist (Medical): No    Lack of Transportation (Non-Medical): No  Physical Activity: Not on file  Stress: Not on file  Social Connections: Not on file     Family History: The patient's family history includes Anxiety disorder in his brother and father; Depression in his brother; Diabetes in his father; Heart attack in his mother.  ROS:   Please see the history  of present illness.     All other systems reviewed and are negative.  EKGs/Labs/Other Studies Reviewed:    The following studies were reviewed today:   EKG:  EKG not ordered today.   Recent Labs: 09/30/2021: Hemoglobin 16.2; Platelets 216; TSH 0.732 12/18/2021: ALT 35 04/15/2022: BUN 16; Creatinine, Ser 0.62; Potassium 4.2; Sodium 137  Recent Lipid Panel    Component Value Date/Time   CHOL 186 04/15/2022 0934   CHOL 211 (H) 09/30/2021 1118   TRIG 127 04/15/2022 0934   HDL 35 (L) 04/15/2022 0934   HDL 46 09/30/2021 1118   CHOLHDL 5.3 04/15/2022 0934   VLDL 25 04/15/2022 0934   LDLCALC 126 (H) 04/15/2022 0934   LDLCALC 133 (H) 09/30/2021 1118     Risk Assessment/Calculations:      Physical Exam:    VS:  BP 98/76 (BP Location: Left Arm, Patient Position: Sitting, Cuff Size: Normal)   Pulse 66   Ht '5\' 7"'$  (1.702 m)   Wt 180 lb (81.6 kg)   SpO2 98%   BMI 28.19 kg/m     Wt Readings from Last 3 Encounters:  05/07/22 180 lb (81.6 kg)  03/19/22 178 lb 6.4 oz (80.9 kg)  02/25/22 180 lb (81.6 kg)     GEN:  Well nourished, well developed in no acute distress HEENT:  Normal NECK: No JVD; No carotid bruits CARDIAC: RRR, 2/6 systolic murmur, mechanical click consistent with mechanical valve noted. RESPIRATORY:  Clear to auscultation without rales, wheezing or rhonchi  ABDOMEN: Soft, non-tender, non-distended MUSCULOSKELETAL:  No edema; No deformity  SKIN: Warm and dry NEUROLOGIC:  Alert and oriented x 3 PSYCHIATRIC:  Normal affect   ASSESSMENT:    1. Coronary artery disease involving native coronary artery of native heart, unspecified whether angina present   2. Thoracic aortic aneurysm without rupture, unspecified part ([place])   3. H/O mechanical aortic valve replacement   4. Mixed hyperlipidemia    PLAN:    In order of problems listed above:  CAD, LHC 05/2021 mid LAD 50%.  IFFR 0.95.  Denies chest pain.  Continue Imdur 30 mg daily. Cont Toprol-XL, Lipitor, warfarin.  aortic root aneurysm, dissection, s/p Bentall procedure with aortic root replacement and mechanical aortic valve (08/2018).  Chest CT with stable aorta size.  Continue Toprol xl 75 mg twice daily. History of mechanical aortic valve replacement, normal aortic valve prosthesis on echo 03/2022.  EF 60 to 65%. hyperlipidemia, triglycerides elevated, LDL elevated.  Start meclizine, cont Lipitor  80 mg daily, fenofibrate.  Repeat fasting lipid profile in 3 months  Follow-up in 3 months  Total encounter time 40 minutes  Greater than 50% was spent in counseling and coordination of care with the patient   Medication Adjustments/Labs and Tests Ordered: Current medicines are reviewed at length with the patient today.  Concerns regarding medicines are outlined above.  Orders Placed This Encounter  Procedures   Lipid panel   Meds ordered this encounter  Medications   Bempedoic Acid-Ezetimibe (NEXLIZET) 180-10 MG TABS    Sig: Take 1 tablet by mouth daily.    Dispense:  30 tablet    Refill:  0    Patient Instructions  Medication Instructions:   START Nexlizet I take one tablet by mouth  daily.   *If you need a refill on your cardiac medications before your next appointment, please call your pharmacy*   Lab Work:  Your physician recommends that you return for lab work in: 3 months - before your  follow up appt at the medical mall. You will need to be fasting. No appt is needed. Hours are M-F 7AM- 6 PM.  If you have labs (blood work) drawn today and your tests are completely normal, you will receive your results only by: [place] (if you have MyChart) OR A paper copy in the mail If you have any lab test that is abnormal or we need to change your treatment, we will call you to review the results.   Testing/Procedures:  None Ordered   Follow-Up: At [facility], you and your health needs are our priority.  As part of our continuing mission to provide you with exceptional heart care, we have created designated Provider Care Teams.  These Care Teams include your primary Cardiologist (physician) and Advanced Practice Providers (APPs -  Physician Assistants and Nurse Practitioners) who all work together to provide you with the care you need, when you need it.  We recommend signing up for the patient portal called "MyChart".  Sign up information is provided on this After Visit Summary.  MyChart is used to connect with patients for Virtual Visits (Telemedicine).  Patients are able to view lab/test results, encounter notes, upcoming appointments, etc.  Non-urgent messages can be sent to your provider as well.   To learn more about what you can do with MyChart, go to [email or web address].    Your next appointment:   3 month(s)  The format for your next appointment:   In Person  Provider:   You may see Kate Sable, MD or one of the following Advanced Practice Providers on your designated Care Team:   Murray Hodgkins, NP Christell Faith, PA-C Cadence Kathlen Mody, PA-C Gerrie Nordmann, NP     Signed, Kate Sable, MD  05/07/2022 10:11 AM    [place]

## 2022-05-11 ENCOUNTER — Other Ambulatory Visit: Payer: Self-pay | Admitting: Cardiology

## 2022-05-13 ENCOUNTER — Encounter: Payer: Self-pay | Admitting: Gastroenterology

## 2022-05-13 ENCOUNTER — Telehealth: Payer: Self-pay

## 2022-05-13 ENCOUNTER — Other Ambulatory Visit: Payer: Self-pay

## 2022-05-13 ENCOUNTER — Ambulatory Visit (INDEPENDENT_AMBULATORY_CARE_PROVIDER_SITE_OTHER): Payer: BC Managed Care – PPO | Admitting: Gastroenterology

## 2022-05-13 ENCOUNTER — Ambulatory Visit: Payer: BC Managed Care – PPO | Attending: Cardiology

## 2022-05-13 VITALS — BP 126/80 | HR 81 | Temp 98.5°F | Ht 67.0 in | Wt 178.4 lb

## 2022-05-13 DIAGNOSIS — K219 Gastro-esophageal reflux disease without esophagitis: Secondary | ICD-10-CM | POA: Diagnosis not present

## 2022-05-13 DIAGNOSIS — Z952 Presence of prosthetic heart valve: Secondary | ICD-10-CM | POA: Diagnosis not present

## 2022-05-13 DIAGNOSIS — R0789 Other chest pain: Secondary | ICD-10-CM

## 2022-05-13 DIAGNOSIS — Z5181 Encounter for therapeutic drug level monitoring: Secondary | ICD-10-CM | POA: Diagnosis not present

## 2022-05-13 LAB — POCT INR: INR: 2.9 (ref 2.0–3.0)

## 2022-05-13 NOTE — Telephone Encounter (Signed)
   [facility] Pre-operative Risk Assessment    Request for surgical clearance:  What type of surgery is being performed? EGD    When is this surgery scheduled? 05/26/2022   Are there any medications that need to be held prior to surgery and how long? Warfarin    Practice name and name of physician performing surgery? [facility]    What is your office phone and fax number? [phone]  [phone]   Anesthesia type (None, local, MAC, general) ? General    Shelby Mattocks 05/13/2022, 2:09 PM  _________________________________________________________________   (provider comments below)

## 2022-05-13 NOTE — Patient Instructions (Signed)
-   Continue 1.5 tablets every day, except 1 tablet Monday and Friday - recheck INR in 3 weeks

## 2022-05-13 NOTE — Progress Notes (Signed)
Cephas Darby, MD [address]  [place]  [place], [place] [address]  Main: [phone]  Fax: [phone]    Gastroenterology Consultation  Referring Provider:     Glean Hess, MD Primary Care Physician:  Glean Hess, MD Primary Gastroenterologist:  Dr. Cephas Darby Reason for Consultation: Chronic GERD, ?  Hiatal hernia        HPI:   Henry Duncan is a 31 y.o. male referred by Dr. Army Melia, Jesse Sans, MD  for consultation & management of chronic GERD.  Patient reports heartburn for more than a year which is intermittent and occurs more than twice a week and sometimes nocturnal.  Patient also reports intermittent chest pain/spasm, cardiac etiology has been ruled out.  He has been taking omeprazole 20 mg daily in the morning and Pepcid as needed.  Patient denies difficulty swallowing. He was also informed that he has hiatal hernia.  Patient is interested to undergo upper endoscopy.  He has history of aortic aneurysm, s/p AVR on Coumadin  NSAIDs: None  Antiplts/Anticoagulants/Anti thrombotics: Coumadin for history of aortic valve  GI Procedures: None  Past Medical History:  Diagnosis Date   Allergy    Anxiety    Aortic aneurysm ([place]) 09/16/2018   a. 08/2018 Asc Ao/root aneuryms w/ dissection-->s/p Bentall w/ 53m Hemashield graft, 245mSJM AVR, and reimplantation of cor arteries; b. 04/2021 CTA Chest: stable post-op changes.   CAD (coronary artery disease)    a. 08/2018 Cor CTA: Ca2+ 99th%'ile (476). Calcified plaque in mLAD. Nl LM, LCX, RCA. S/p reimplantation of coronary arteries in setting of Bentall; b. 05/2021 Cor CTA: LAD >7098ml FFRct); c. 05/2021 Cath: LM nl, LAD 40p, 38m48mX/OM1 min irregs, RCA nl->Med rx.   H/O mechanical aortic valve replacement    a. 08/2018 in setting of bicuspid AoV and Asc Ao dissection-->23mm75m mech AVR; b. 03/2020 Echo: EF 55-60%. No rwma. Mild LVH. Nl RV fxn. Nl functioning AVR.   Hyperlipidemia    Hypertension     Pericardial effusion    a. 08/2018 post-op -->s/p window.    Past Surgical History:  Procedure Laterality Date   BENTALL PROCEDURE N/A 09/19/2018   Procedure: BENTALL PROCEDURE WITH 23 ST[address] [place]geon: BartlGaye Pollack  [facility];  Laterality: N/A;   CARDIAC SURGERY     CLEFT LIP REPAIR     CORONARY ANGIOGRAPHY Left 06/13/2021   Procedure: CORONARY ANGIOGRAPHY (CATH LAB);  Surgeon: AridaWellington Hampshire  [facility];  Service: Cardiovascular;  Laterality: Left;   CORONARY PRESSURE WIRE/FFR WITH 3D MAPPING N/A 06/13/2021   Procedure: Coronary Pressure Wire, IFR;  Surgeon: AridaWellington Hampshire  [facility];  Service: Cardiovascular;  Laterality: N/A;   SUBXYPHOID PERICARDIAL WINDOW N/A 09/26/2018   Procedure: SUBXYPHOID PERICARDIAL WINDOW;  Surgeon: BartlGaye Pollack  [facility];  Service: Thoracic;  Laterality: N/A;   TEE WITHOUT CARDIOVERSION N/A 09/19/2018   Procedure: TRANSESOPHAGEAL ECHOCARDIOGRAM (TEE);  Surgeon: BartlGaye Pollack  [facility];  Laterality: N/A;   WISDOM TOOTH EXTRACTION       Current Outpatient Medications:    ALPRAZolam (XANAX) 0.5 MG tablet, Take 1 tablet (0.5 mg total) by mouth 2 (two) times daily as needed for anxiety., Disp: 15 tablet, Rfl: 5   atorvastatin (LIPITOR) 80 MG tablet, Take 1 tablet (80 mg  total) by mouth daily., Disp: 90 tablet, Rfl: 0   Bempedoic Acid-Ezetimibe (NEXLIZET) 180-10 MG TABS, Take 1 tablet by mouth daily., Disp: 30 tablet, Rfl: 0   busPIRone (BUSPAR) 5 MG tablet, TAKE ONE TABLET BY MOUTH TWICE A DAY, Disp: 180 tablet, Rfl: 3   cetirizine (ZYRTEC) 10 MG tablet, Take 10 mg by mouth daily as needed for allergies., Disp: , Rfl:    clobetasol cream (TEMOVATE) 0.78 %, Apply 1 application topically 2 (two) times daily. To external ear canals, Disp: 30 g, Rfl: 0   fenofibrate (TRICOR) 145 MG tablet,  Take 1 tablet (145 mg total) by mouth daily., Disp: 30 tablet, Rfl: 3   isosorbide mononitrate (IMDUR) 30 MG 24 hr tablet, Take 1 tablet (30 mg total) by mouth daily., Disp: 30 tablet, Rfl: 3   methocarbamol (ROBAXIN) 500 MG tablet, Take 1 tablet (500 mg total) by mouth at bedtime., Disp: 30 tablet, Rfl: 0   metoprolol succinate (TOPROL-XL) 50 MG 24 hr tablet, Take 1 1/2 tabs ('75mg'$ ) by mouth twice a day, Disp: 270 tablet, Rfl: 0   omeprazole (PRILOSEC) 20 MG capsule, Take 20 mg by mouth daily., Disp: , Rfl:    OTEZLA 30 MG TABS, Take 1 tablet by mouth 2 (two) times daily., Disp: , Rfl:    warfarin (COUMADIN) 5 MG tablet, Take 1 tablet to 1.5 tablets by mouth daily or as directed by the Anticoagulation Clinic., Disp: 135 tablet, Rfl: 1   ZORYVE 0.3 % CREA, SMARTSIG:sparingly Topical Daily, Disp: , Rfl:    Family History  Problem Relation Age of Onset   Heart attack Mother    Anxiety disorder Father    Diabetes Father    Depression Brother    Anxiety disorder Brother      Social History   Tobacco Use   Smoking status: Never   Smokeless tobacco: Never  Vaping Use   Vaping Use: Former  Substance Use Topics   Alcohol use: Yes   Drug use: Never    Allergies as of 05/13/2022   (No Known Allergies)    Review of Systems:    All systems reviewed and negative except where noted in HPI.   Physical Exam:  BP 126/80 (BP Location: Left Arm, Patient Position: Sitting, Cuff Size: Normal)   Pulse 81   Temp 98.5 F (36.9 C) (Oral)   Ht '5\' 7"'$  (1.702 m)   Wt 178 lb 6 oz (80.9 kg)   BMI 27.94 kg/m  No LMP for male patient.  General:   Alert,  Well-developed, well-nourished, pleasant and cooperative in NAD Head:  Normocephalic and atraumatic. Eyes:  Sclera clear, no icterus.   Conjunctiva pink. Ears:  Normal auditory acuity. Nose:  No deformity, discharge, or lesions. Mouth:  No deformity or lesions,oropharynx pink & moist. Neck:  Supple; no masses or thyromegaly. Lungs:   Respirations even and unlabored.  Clear throughout to auscultation.   No wheezes, crackles, or rhonchi. No acute distress. Heart:  Regular rate and rhythm; no murmurs, clicks, rubs, or gallops. Abdomen:  Normal bowel sounds. Soft, non-tender and non-distended without masses, hepatosplenomegaly or hernias noted.  No guarding or rebound tenderness.   Rectal: Not performed Msk:  Symmetrical without gross deformities. Good, equal movement & strength bilaterally. Pulses:  Normal pulses noted. Extremities:  No clubbing or edema.  No cyanosis. Neurologic:  Alert and oriented x3;  grossly normal neurologically. Skin:  Intact without significant lesions or rashes. No jaundice. Psych:  Alert and cooperative. Normal mood and  affect.  Imaging Studies: No abdominal imaging  Assessment and Plan:   SAEL FURCHES is a 31 y.o. male with history of hypertension, hyperlipidemia, history of coronary disease s/p PCI, aortic aneurysm dissection, s/p AVR on Coumadin is seen in consultation for chronic symptoms of GERD  Chronic GERD and atypical chest pain Continue omeprazole 20 mg daily before meals Discussed about antireflux lifestyle Discussed about upper endoscopy with esophageal biopsies to rule out EOE after cardiac clearance and clearance for interruption of Coumadin  I have discussed alternative options, risks & benefits,  which include, but are not limited to, bleeding, infection, perforation,respiratory complication & drug reaction.  The patient agrees with this plan & written consent will be obtained.      Follow up based on EGD findings   Cephas Darby, MD

## 2022-05-13 NOTE — Telephone Encounter (Signed)
   Patient Name: Henry Duncan  DOB: Apr 05, 1991 MRN: [id number]  Primary Cardiologist: Kate Sable, MD  Chart reviewed as part of pre-operative protocol coverage. Patient has an EGD scheduled for 05/26/2022 and we were asked to give our recommendations for holding Coumadin. He was recently seen by Dr. Garen Lah on 05/07/2022 at which time he was doing well from a cardiac standpoint. Per Pharmacy, patient can hold Coumadin for 5 days prior to procedure but WILL need to be bridged with Lovenox. Our Coumadin Clinic will help arrange this.   I will route this recommendation to the requesting party via Epic fax function and remove from pre-op pool.  Please call with questions.  Darreld Mclean, PA-C 05/13/2022, 4:50 PM

## 2022-05-13 NOTE — Telephone Encounter (Signed)
Patient with diagnosis of AVR on warfarin for anticoagulation.    Procedure: EGD Date of procedure: 05/26/22  CrCl 199 mL/min Platelet count 216K  Per office protocol, patient can hold warfarin for 5 days prior to procedure.    Patient bridged with Lovenox in the past per MD. Will bridge again for consistency.  **This guidance is not considered finalized until pre-operative APP has relayed final recommendations.**

## 2022-05-13 NOTE — Telephone Encounter (Signed)
Pharmacy can you please comment on how long patient can hold Coumadin for upcoming procedure? It looks like he is on Coumadin for a mechanical valve.  Thank you!

## 2022-05-14 ENCOUNTER — Encounter: Payer: Self-pay | Admitting: Cardiology

## 2022-05-14 ENCOUNTER — Encounter: Payer: Self-pay | Admitting: Gastroenterology

## 2022-05-14 NOTE — Telephone Encounter (Signed)
Called and informed patient about stopping the Warfarin 5 days before procedure. Informed patient that they would be contacting him about doing the Lovenox bridge

## 2022-05-15 ENCOUNTER — Telehealth: Payer: Self-pay

## 2022-05-15 ENCOUNTER — Other Ambulatory Visit: Payer: Self-pay

## 2022-05-15 MED ORDER — ENOXAPARIN SODIUM 80 MG/0.8ML IJ SOSY: 80.0000 mg | PREFILLED_SYRINGE | Freq: Two times a day (BID) | INTRAMUSCULAR | 1 refills | Status: DC

## 2022-05-15 NOTE — Telephone Encounter (Signed)
Sent message to Dr Marius Ditch regarding Dr Thereasa Solo warfarin recommendations

## 2022-05-15 NOTE — Telephone Encounter (Signed)
I spoke to the patient and informed him that per Dr Thereasa Solo last note he will not need to HOLD Coumadin prior to his 1/2 Endoscopy.  He verbalized understanding.

## 2022-05-19 ENCOUNTER — Other Ambulatory Visit: Payer: Self-pay | Admitting: Cardiology

## 2022-05-19 ENCOUNTER — Encounter: Payer: Self-pay | Admitting: Cardiology

## 2022-05-20 MED ORDER — ATORVASTATIN CALCIUM 80 MG PO TABS: 80.0000 mg | ORAL_TABLET | Freq: Every day | ORAL | 3 refills | Status: DC

## 2022-05-22 ENCOUNTER — Telehealth: Payer: Self-pay

## 2022-05-22 DIAGNOSIS — G4733 Obstructive sleep apnea (adult) (pediatric): Secondary | ICD-10-CM | POA: Diagnosis not present

## 2022-05-22 NOTE — Telephone Encounter (Signed)
-----   Message from Kate Sable, MD sent at 05/14/2022  5:29 PM EST ----- Patient has a mechanical valve, as such I recommend patient not to stop Coumadin.  Coumadin should be continued. Thank you ----- Message ----- From: Shelby Mattocks, CMA Sent: 05/13/2022   2:10 PM EST To: Kate Sable, MD

## 2022-05-22 NOTE — Telephone Encounter (Signed)
Called patient to let him know that per Dr. Kate Sable, he does not want him to stop his coumadin due to patient having a mechanical valve. Patient understood and had no further questions.

## 2022-05-26 ENCOUNTER — Encounter: Payer: Self-pay | Admitting: Gastroenterology

## 2022-05-26 ENCOUNTER — Encounter
Admission: RE | Disposition: A | Discharge status: Home / Self Care | Payer: Self-pay | Source: Home / Self Care | Attending: Gastroenterology

## 2022-05-26 ENCOUNTER — Ambulatory Visit: Payer: BC Managed Care – PPO | Admitting: General Practice

## 2022-05-26 ENCOUNTER — Telehealth: Payer: Self-pay | Admitting: Internal Medicine

## 2022-05-26 ENCOUNTER — Ambulatory Visit
Admission: RE | Admit: 2022-05-26 | Discharge: 2022-05-26 | Disposition: A | Discharge status: Home / Self Care | Payer: BC Managed Care – PPO | Attending: Gastroenterology | Admitting: Gastroenterology

## 2022-05-26 DIAGNOSIS — G4733 Obstructive sleep apnea (adult) (pediatric): Secondary | ICD-10-CM | POA: Diagnosis not present

## 2022-05-26 DIAGNOSIS — K2100 Gastro-esophageal reflux disease with esophagitis, without bleeding: Secondary | ICD-10-CM | POA: Diagnosis not present

## 2022-05-26 DIAGNOSIS — K219 Gastro-esophageal reflux disease without esophagitis: Secondary | ICD-10-CM | POA: Diagnosis not present

## 2022-05-26 DIAGNOSIS — K2210 Ulcer of esophagus without bleeding: Secondary | ICD-10-CM

## 2022-05-26 DIAGNOSIS — I2510 Atherosclerotic heart disease of native coronary artery without angina pectoris: Secondary | ICD-10-CM | POA: Diagnosis not present

## 2022-05-26 DIAGNOSIS — E785 Hyperlipidemia, unspecified: Secondary | ICD-10-CM | POA: Diagnosis not present

## 2022-05-26 DIAGNOSIS — R0789 Other chest pain: Secondary | ICD-10-CM | POA: Insufficient documentation

## 2022-05-26 HISTORY — PX: ESOPHAGOGASTRODUODENOSCOPY (EGD) WITH PROPOFOL: SHX5813

## 2022-05-26 SURGERY — ESOPHAGOGASTRODUODENOSCOPY (EGD) WITH PROPOFOL
Anesthesia: General

## 2022-05-26 MED ORDER — SODIUM CHLORIDE 0.9 % IV SOLN: INTRAVENOUS | Status: DC

## 2022-05-26 MED ORDER — DEXMEDETOMIDINE HCL IN NACL 200 MCG/50ML IV SOLN
INTRAVENOUS | Status: DC | PRN
  Administered 2022-05-26: 8 ug via INTRAVENOUS

## 2022-05-26 MED ORDER — LIDOCAINE HCL (CARDIAC) PF 100 MG/5ML IV SOSY
PREFILLED_SYRINGE | INTRAVENOUS | Status: DC | PRN
  Administered 2022-05-26: 30 mg via INTRAVENOUS

## 2022-05-26 MED ORDER — OMEPRAZOLE 40 MG PO CPDR: 40.0000 mg | DELAYED_RELEASE_CAPSULE | Freq: Two times a day (BID) | ORAL | 2 refills | Status: DC

## 2022-05-26 MED ORDER — PROPOFOL 10 MG/ML IV BOLUS
INTRAVENOUS | Status: DC | PRN
  Administered 2022-05-26: 80 mg via INTRAVENOUS

## 2022-05-26 MED ORDER — PROPOFOL 500 MG/50ML IV EMUL
INTRAVENOUS | Status: DC | PRN
  Administered 2022-05-26: 200 ug/kg/min via INTRAVENOUS

## 2022-05-26 MED ORDER — DEXMEDETOMIDINE HCL IN NACL 80 MCG/20ML IV SOLN
INTRAVENOUS | Status: AC
  Filled 2022-05-26: qty 20

## 2022-05-26 NOTE — Telephone Encounter (Signed)
Kia from [facility] healt Sleep study center, says request was denied for sleep study referral, says doesn't meet criteria, so they are requesting a peer to peer appeal.

## 2022-05-26 NOTE — Anesthesia Preprocedure Evaluation (Signed)
Anesthesia Evaluation  Patient identified by MRN, date of birth, ID band Patient awake    Reviewed: Allergy & Precautions, NPO status , Patient's Chart, lab work & pertinent test results  History of Anesthesia Complications Negative for: history of anesthetic complications  Airway Mallampati: III  TM Distance: >3 FB Neck ROM: full    Dental  (+) Dental Advidsory Given   Pulmonary neg pulmonary ROS, neg COPD   Pulmonary exam normal        Cardiovascular hypertension, (-) angina Normal cardiovascular exam+ Valvular Problems/Murmurs      Neuro/Psych negative neurological ROS  negative psych ROS   GI/Hepatic negative GI ROS, Neg liver ROS,,,  Endo/Other  negative endocrine ROS    Renal/GU negative Renal ROS  negative genitourinary   Musculoskeletal   Abdominal   Peds  Hematology negative hematology ROS (+)   Anesthesia Other Findings PMH of aortic aneurysm with surgical correction and aortic valve replacement. Patient denies any symptoms today including shortness of breath, chest pain, dizzyness.   Past Medical History: No date: Allergy No date: Anxiety 09/16/2018: Aortic aneurysm (HCC)     Comment:  a. 08/2018 Asc Ao/root aneuryms w/ dissection-->s/p               Bentall w/ 77m Hemashield graft, 253mSJM AVR, and               reimplantation of cor arteries; b. 04/2021 CTA Chest:               stable post-op changes. No date: CAD (coronary artery disease)     Comment:  a. 08/2018 Cor CTA: Ca2+ 99th%'ile (476). Calcified               plaque in mLAD. Nl LM, LCX, RCA. S/p reimplantation of               coronary arteries in setting of Bentall; b. 05/2021 Cor               CTA: LAD >7037ml FFRct); c. 05/2021 Cath: LM nl, LAD 40p,              43m75mX/OM1 min irregs, RCA nl->Med rx. No date: H/O mechanical aortic valve replacement     Comment:  a. 08/2018 in setting of bicuspid AoV and Asc Ao                dissection-->23mm43m mech AVR; b. 03/2020 Echo: EF               55-60%. No rwma. Mild LVH. Nl RV fxn. Nl functioning AVR. No date: Hyperlipidemia No date: Hypertension No date: Pericardial effusion     Comment:  a. 08/2018 post-op -->s/p window.  Past Surgical History: 09/19/2018: BENTALL PROCEDURE; N/A     Comment:  Procedure: BENTALL PROCEDURE WITH 23 STNovatourgeon: BartlGaye Pollack  [facility];  Service: Open Heart Surgery;  Laterality: N/A; No date: CARDIAC SURGERY No date: CLEFT LIP REPAIR 06/13/2021: CORONARY ANGIOGRAPHY; Left     Comment:  Procedure: CORONARY ANGIOGRAPHY (CATH LAB);  Surgeon:               AridaWellington Hampshire  [facility];  Service: Cardiovascular;  Laterality: Left; 06/13/2021: CORONARY PRESSURE WIRE/FFR WITH 3D MAPPING; N/A     Comment:  Procedure: Coronary Pressure Wire, IFR;  Surgeon: Wellington Hampshire, MD;  Location: [facility];                Service: Cardiovascular;  Laterality: N/A; 09/26/2018: SUBXYPHOID PERICARDIAL WINDOW; N/A     Comment:  Procedure: SUBXYPHOID PERICARDIAL WINDOW;  Surgeon:               Gaye Pollack, MD;  Location: [facility];  Service:               Thoracic;  Laterality: N/A; 09/19/2018: TEE WITHOUT CARDIOVERSION; N/A     Comment:  Procedure: TRANSESOPHAGEAL ECHOCARDIOGRAM (TEE);                Surgeon: Gaye Pollack, MD;  Location: [facility];  Service:              Open Heart Surgery;  Laterality: N/A; No date: WISDOM TOOTH EXTRACTION  BMI    Body Mass Index: 28.38 kg/m      Reproductive/Obstetrics negative OB ROS                             Anesthesia Physical Anesthesia Plan  ASA: 2  Anesthesia Plan: General   Post-op Pain Management: Minimal or no pain anticipated   Induction: Intravenous  PONV Risk Score and Plan: 2 and Propofol infusion, TIVA and Ondansetron  Airway  Management Planned: Nasal Cannula  Additional Equipment: None  Intra-op Plan:   Post-operative Plan:   Informed Consent: I have reviewed the patients History and Physical, chart, labs and discussed the procedure including the risks, benefits and alternatives for the proposed anesthesia with the patient or authorized representative who has indicated his/her understanding and acceptance.     Dental advisory given  Plan Discussed with: CRNA and Surgeon  Anesthesia Plan Comments: (Discussed risks of anesthesia with patient, including possibility of difficulty with spontaneous ventilation under anesthesia necessitating airway intervention, PONV, and rare risks such as cardiac or respiratory or neurological events, and allergic reactions. Discussed the role of CRNA in patient's perioperative care. Patient understands.)       Anesthesia Quick Evaluation

## 2022-05-26 NOTE — Telephone Encounter (Signed)
Please review.  KP

## 2022-05-26 NOTE — H&P (Signed)
Cephas Darby, MD [address]  [place]  [place], [place] [address]  Main: [phone]  Fax: [phone] Pager: [phone]  Primary Care Physician:  Glean Hess, MD Primary Gastroenterologist:  Dr. Cephas Darby  Pre-Procedure History & Physical: HPI:  Henry Duncan is a 32 y.o. male is here for an endoscopy.   Past Medical History:  Diagnosis Date   Allergy    Anxiety    Aortic aneurysm ([place]) 09/16/2018   a. 08/2018 Asc Ao/root aneuryms w/ dissection-->s/p Bentall w/ 37m Hemashield graft, 265mSJM AVR, and reimplantation of cor arteries; b. 04/2021 CTA Chest: stable post-op changes.   CAD (coronary artery disease)    a. 08/2018 Cor CTA: Ca2+ 99th%'ile (476). Calcified plaque in mLAD. Nl LM, LCX, RCA. S/p reimplantation of coronary arteries in setting of Bentall; b. 05/2021 Cor CTA: LAD >7065ml FFRct); c. 05/2021 Cath: LM nl, LAD 40p, 77m22mX/OM1 min irregs, RCA nl->Med rx.   H/O mechanical aortic valve replacement    a. 08/2018 in setting of bicuspid AoV and Asc Ao dissection-->23mm40m mech AVR; b. 03/2020 Echo: EF 55-60%. No rwma. Mild LVH. Nl RV fxn. Nl functioning AVR.   Hyperlipidemia    Hypertension    Pericardial effusion    a. 08/2018 post-op -->s/p window.    Past Surgical History:  Procedure Laterality Date   BENTALL PROCEDURE N/A 09/19/2018   Procedure: BENTALL PROCEDURE WITH 23 ST. J[place] Harbor[place]geon: BartlGaye Pollack  [facility];  Laterality: N/A;   CARDIAC SURGERY     CLEFT LIP REPAIR     CORONARY ANGIOGRAPHY Left 06/13/2021   Procedure: CORONARY ANGIOGRAPHY (CATH LAB);  Surgeon: AridaWellington Hampshire  [facility];  Service: Cardiovascular;  Laterality: Left;   CORONARY PRESSURE WIRE/FFR WITH 3D MAPPING N/A 06/13/2021   Procedure: Coronary Pressure Wire, IFR;  Surgeon: AridaWellington Hampshire  [facility];  Service: Cardiovascular;  Laterality: N/A;    SUBXYPHOID PERICARDIAL WINDOW N/A 09/26/2018   Procedure: SUBXYPHOID PERICARDIAL WINDOW;  Surgeon: BartlGaye Pollack  [facility];  Service: Thoracic;  Laterality: N/A;   TEE WITHOUT CARDIOVERSION N/A 09/19/2018   Procedure: TRANSESOPHAGEAL ECHOCARDIOGRAM (TEE);  Surgeon: BartlGaye Pollack  [facility]: Open Heart Surgery;  Laterality: N/A;   WISDOM TOOTH EXTRACTION      Prior to Admission medications   Medication Sig Start Date End Date Taking? Authorizing Provider  ALPRAZolam (XANADuanne Moron MG tablet Take 1 tablet (0.5 mg total) by mouth 2 (two) times daily as needed for anxiety. 04/23/22  Yes BerglGlean Hess atorvastatin (LIPITOR) 80 MG tablet Take 1 tablet (80 mg total) by mouth daily. 05/20/22 05/15/23 Yes Agbor-Etang, BrianAaron Edelman Bempedoic Acid-Ezetimibe (NEXLIZET) 180-10 MG TABS Take 1 tablet by mouth daily. 05/07/22  Yes AgborKate Sable busPIRone (BUSPAR) 5 MG tablet TAKE ONE TABLET BY MOUTH TWICE A DAY 04/06/22  Yes BerglGlean Hess fenofibrate (TRICOR) 145 MG tablet Take 1 tablet (145 mg total) by mouth daily. 02/26/22  Yes AgborKate Sable isosorbide mononitrate (IMDUR) 30 MG 24 hr tablet Take 1 tablet (30 mg total) by mouth daily. 04/10/22  Yes Agbor-Etang, BrianAaron Edelman methocarbamol (ROBAXIN) 500 MG tablet Take 1 tablet (500 mg total) by mouth at bedtime. 02/25/22  Yes BerglGlean Hess metoprolol succinate (TOPROL-XL) 50 MG 24 hr  tablet Take 1 1/2 tabs ('75mg'$ ) by mouth twice a day 04/30/22  Yes Agbor-Etang, Aaron Edelman, MD  omeprazole (PRILOSEC) 20 MG capsule Take 20 mg by mouth daily.   Yes [provider]  OTEZLA 30 MG TABS Take 1 tablet by mouth 2 (two) times daily. 04/28/22  Yes [provider]  warfarin (COUMADIN) 5 MG tablet Take 1 tablet to 1.5 tablets by mouth daily or as directed by the Anticoagulation Clinic. 01/12/22  Yes Agbor-Etang, Aaron Edelman, MD  cetirizine (ZYRTEC) 10 MG tablet Take 10 mg by mouth daily as needed  for allergies.    [provider]  clobetasol cream (TEMOVATE) 4.74 % Apply 1 application topically 2 (two) times daily. To external ear canals 09/03/20   Glean Hess, MD  ZORYVE 0.3 % CREA SMARTSIG:sparingly Topical Daily 01/08/22   [provider]    Allergies as of 05/13/2022   (No Known Allergies)    Family History  Problem Relation Age of Onset   Heart attack Mother    Anxiety disorder Father    Diabetes Father    Depression Brother    Anxiety disorder Brother     Social History   Socioeconomic History   Marital status: Married    Spouse name: Not on file   Number of children: 0   Years of education: Not on file   Highest education level: Not on file  Occupational History   Not on file  Tobacco Use   Smoking status: Never   Smokeless tobacco: Never  Vaping Use   Vaping Use: Former  Substance and Sexual Activity   Alcohol use: Yes   Drug use: Never   Sexual activity: Yes    Partners: Female    Birth control/protection: Condom  Other Topics Concern   Not on file  Social History Narrative   Not on file   Social Determinants of Health   Financial Resource Strain: Low Risk  (09/30/2021)   Overall Financial Resource Strain (CARDIA)    Difficulty of Paying Living Expenses: Not hard at all  Food Insecurity: No Food Insecurity (09/30/2021)   Hunger Vital Sign    Worried About Running Out of Food in the Last Year: Never true    [place] in the Last Year: Never true  Transportation Needs: No Transportation Needs (09/30/2021)   PRAPARE - Hydrologist (Medical): No    Lack of Transportation (Non-Medical): No  Physical Activity: Not on file  Stress: Not on file  Social Connections: Not on file  Intimate Partner Violence: Not At Risk (09/30/2021)   Humiliation, Afraid, Rape, and Kick questionnaire    Fear of Current or Ex-Partner: No    Emotionally Abused: No    Physically Abused: No    Sexually Abused: No     Review of Systems: See HPI, otherwise negative ROS  Physical Exam: BP 102/65   Pulse 84   Temp (!) 97.4 F (36.3 C) (Temporal)   Resp 16   Wt 82.2 kg   SpO2 99%   BMI 28.38 kg/m  General:   Alert,  pleasant and cooperative in NAD Head:  Normocephalic and atraumatic. Neck:  Supple; no masses or thyromegaly. Lungs:  Clear throughout to auscultation.    Heart:  Regular rate and rhythm. Abdomen:  Soft, nontender and nondistended. Normal bowel sounds, without guarding, and without rebound.   Neurologic:  Alert and  oriented x4;  grossly normal neurologically.  Impression/Plan: Henry Duncan is here  for an upper endoscopy to be performed for Chronic GERD and atypical chest pain   Risks, benefits, limitations, and alternatives regarding  endoscopy have been reviewed with the patient.  Questions have been answered.  All parties agreeable.   Sherri Sear, MD  05/26/2022, 8:40 AM

## 2022-05-26 NOTE — Telephone Encounter (Signed)
Copied from [place] [id number]. Topic: General - Other >> May 26, 2022 10:51 AM Everette C wrote: Reason for CRM: Kia with [facility] has called to request orders for a sleep study   Kia would like orders split night study   Please contact further if/when possible

## 2022-05-26 NOTE — Anesthesia Postprocedure Evaluation (Signed)
Anesthesia Post Note  Patient: Henry Duncan  Procedure(s) Performed: ESOPHAGOGASTRODUODENOSCOPY (EGD) WITH PROPOFOL  Patient location during evaluation: Endoscopy Anesthesia Type: General Level of consciousness: awake and alert Pain management: pain level controlled Vital Signs Assessment: post-procedure vital signs reviewed and stable Respiratory status: spontaneous breathing, nonlabored ventilation, respiratory function stable and patient connected to nasal cannula oxygen Cardiovascular status: blood pressure returned to baseline and stable Postop Assessment: no apparent nausea or vomiting Anesthetic complications: no  There were no known notable events for this encounter.   Last Vitals:  Vitals:   05/26/22 0940 05/26/22 0950  BP: 94/64 93/74  Pulse:    Resp:    Temp: (!) 36.4 C   SpO2:      Last Pain:  Vitals:   05/26/22 1000  TempSrc:   PainSc: 0-No pain                 Dimas Millin

## 2022-05-26 NOTE — Transfer of Care (Addendum)
Immediate Anesthesia Transfer of Care Note  Patient: Henry Duncan  Procedure(s) Performed: ESOPHAGOGASTRODUODENOSCOPY (EGD) WITH PROPOFOL  Patient Location: PACU  Anesthesia Type:General  Level of Consciousness: awake, alert , drowsy, patient cooperative, and responds to stimulation  Airway & Oxygen Therapy: Patient Spontanous Breathing  Post-op Assessment: Report given to RN and Post -op Vital signs reviewed and stable  Post vital signs: Reviewed and stable  Last Vitals:  Vitals Value Taken Time  BP 165/152 05/26/22 0942  Temp    Pulse 76 05/26/22 0942  Resp 27 05/26/22 0942  SpO2 96 % 05/26/22 0942  Vitals shown include unvalidated device data.  Last Pain:  Vitals:   05/26/22 0816  TempSrc: Temporal         Complications: No notable events documented.

## 2022-05-26 NOTE — Op Note (Signed)
[facility] Gastroenterology Patient Name: Henry Duncan Procedure Date: 05/26/2022 9:29 AM MRN: [id number] Account #: [id number] Date of Birth: 12-28-1990 Admit Type: Outpatient Age: 32 Room: [facility] ENDO ROOM 3 Gender: Male Note Status: Finalized Instrument Name: Upper Endoscope [id number] Procedure:             Upper GI endoscopy Indications:           Follow-up of reflux esophagitis, Chest pain (non                         cardiac) Providers:             Lin Landsman MD, MD Referring MD:          Halina Maidens, MD (Referring MD) Medicines:             General Anesthesia Complications:         No immediate complications. Estimated blood loss: None. Procedure:             Pre-Anesthesia Assessment:                        - Prior to the procedure, a History and Physical was                         performed, and patient medications and allergies were                         reviewed. The patient is competent. The risks and                         benefits of the procedure and the sedation options and                         risks were discussed with the patient. All questions                         were answered and informed consent was obtained.                         Patient identification and proposed procedure were                         verified by the physician, the nurse, the                         anesthesiologist, the anesthetist and the technician                         in the pre-procedure area in the procedure room in the                         endoscopy suite. Mental Status Examination: alert and                         oriented. Airway Examination: normal oropharyngeal                         airway and neck mobility. Respiratory Examination:  clear to auscultation. CV Examination: normal.                         Prophylactic Antibiotics: The patient does not require                         prophylactic  antibiotics. Prior Anticoagulants: The                         patient has taken Coumadin (warfarin), last dose was 1                         day prior to procedure. ASA Grade Assessment: III - A                         patient with severe systemic disease. After reviewing                         the risks and benefits, the patient was deemed in                         satisfactory condition to undergo the procedure. The                         anesthesia plan was to use general anesthesia.                         Immediately prior to administration of medications,                         the patient was re-assessed for adequacy to receive                         sedatives. The heart rate, respiratory rate, oxygen                         saturations, blood pressure, adequacy of pulmonary                         ventilation, and response to care were monitored                         throughout the procedure. The physical status of the                         patient was re-assessed after the procedure.                        After obtaining informed consent, the endoscope was                         passed under direct vision. Throughout the procedure,                         the patient's blood pressure, pulse, and oxygen                         saturations were monitored continuously. The Endoscope  was introduced through the mouth, and advanced to the                         second part of duodenum. The upper GI endoscopy was                         accomplished without difficulty. The patient tolerated                         the procedure well. Findings:      The duodenal bulb and second portion of the duodenum were normal.      The entire examined stomach was normal.      The cardia and gastric fundus were normal on retroflexion.      Esophagogastric landmarks were identified: the gastroesophageal junction       was found at 39 cm from the incisors.      LA  Grade A (one or more mucosal breaks less than 5 mm, not extending       between tops of 2 mucosal folds) esophagitis with no bleeding was found       at the gastroesophageal junction. Impression:            - Normal duodenal bulb and second portion of the                         duodenum.                        - Normal stomach.                        - Esophagogastric landmarks identified.                        - LA Grade A reflux esophagitis with no bleeding.                        - No specimens collected. Recommendation:        - Discharge patient to home (with escort).                        - Resume previous diet today.                        - Continue present medications.                        - Use Prilosec (omeprazole) 40 mg PO BID for 3 months. Procedure Code(s):     --- Professional ---                        [phone], Esophagogastroduodenoscopy, flexible,                         transoral; diagnostic, including collection of                         specimen(s) by brushing or washing, when performed                         (separate procedure) Diagnosis Code(s):     ---  Professional ---                        K21.00, Gastro-esophageal reflux disease with                         esophagitis, without bleeding                        R07.89, Other chest pain CPT copyright 2022 American Medical Association. All rights reserved. The codes documented in this report are preliminary and upon coder review may  be revised to meet current compliance requirements. Dr. Ulyess Mort Lin Landsman MD, MD 05/26/2022 9:40:36 AM This report has been signed electronically. Number of Addenda: 0 Note Initiated On: 05/26/2022 9:29 AM Estimated Blood Loss:  Estimated blood loss: none.      [facility]

## 2022-05-27 ENCOUNTER — Encounter: Payer: Self-pay | Admitting: Gastroenterology

## 2022-05-27 ENCOUNTER — Other Ambulatory Visit: Payer: Self-pay

## 2022-05-27 ENCOUNTER — Encounter: Payer: Self-pay | Admitting: Internal Medicine

## 2022-05-27 DIAGNOSIS — G4730 Sleep apnea, unspecified: Secondary | ICD-10-CM

## 2022-05-27 NOTE — Telephone Encounter (Signed)
Referral for [facility] was placed.  KP

## 2022-06-03 ENCOUNTER — Ambulatory Visit: Payer: BC Managed Care – PPO

## 2022-06-15 ENCOUNTER — Other Ambulatory Visit: Payer: Self-pay

## 2022-06-15 ENCOUNTER — Other Ambulatory Visit: Payer: Self-pay | Admitting: Cardiology

## 2022-06-15 MED ORDER — NEXLIZET 180-10 MG PO TABS: 1.0000 | ORAL_TABLET | Freq: Every day | ORAL | 0 refills | Status: DC

## 2022-06-15 MED ORDER — FENOFIBRATE 145 MG PO TABS: 145.0000 mg | ORAL_TABLET | Freq: Every day | ORAL | 3 refills | Status: DC

## 2022-06-17 ENCOUNTER — Encounter: Payer: Self-pay | Admitting: Cardiology

## 2022-06-17 ENCOUNTER — Ambulatory Visit: Payer: BC Managed Care – PPO | Attending: Cardiology

## 2022-06-17 DIAGNOSIS — Z5181 Encounter for therapeutic drug level monitoring: Secondary | ICD-10-CM

## 2022-06-17 DIAGNOSIS — Z952 Presence of prosthetic heart valve: Secondary | ICD-10-CM | POA: Diagnosis not present

## 2022-06-17 LAB — POCT INR: INR: 3.9 — AB (ref 2.0–3.0)

## 2022-06-17 NOTE — Patient Instructions (Signed)
-  HOLD TODAY ONLY - Continue 1.5 tablets every day, except 1 tablet Monday and Friday - recheck INR in 3 weeks

## 2022-06-19 ENCOUNTER — Encounter (HOSPITAL_BASED_OUTPATIENT_CLINIC_OR_DEPARTMENT_OTHER): Payer: BC Managed Care – PPO | Admitting: Internal Medicine

## 2022-06-22 DIAGNOSIS — G4733 Obstructive sleep apnea (adult) (pediatric): Secondary | ICD-10-CM | POA: Diagnosis not present

## 2022-06-29 ENCOUNTER — Institutional Professional Consult (permissible substitution): Payer: BC Managed Care – PPO | Admitting: Adult Health

## 2022-07-08 ENCOUNTER — Ambulatory Visit: Payer: BC Managed Care – PPO | Attending: Cardiology | Admitting: Pharmacist

## 2022-07-08 DIAGNOSIS — Z5181 Encounter for therapeutic drug level monitoring: Secondary | ICD-10-CM | POA: Diagnosis not present

## 2022-07-08 DIAGNOSIS — Z952 Presence of prosthetic heart valve: Secondary | ICD-10-CM

## 2022-07-08 LAB — POCT INR: INR: 4.8 — AB (ref 2.0–3.0)

## 2022-07-08 NOTE — Patient Instructions (Signed)
Description   -HOLD TODAY AND TOMORROW - Continue 1.5 tablets every day, except 1 tablet Monday and Friday - recheck INR in 2 weeks

## 2022-07-13 DIAGNOSIS — Z79899 Other long term (current) drug therapy: Secondary | ICD-10-CM | POA: Diagnosis not present

## 2022-07-13 DIAGNOSIS — L400 Psoriasis vulgaris: Secondary | ICD-10-CM | POA: Diagnosis not present

## 2022-07-22 ENCOUNTER — Ambulatory Visit: Payer: BC Managed Care – PPO | Attending: Cardiology

## 2022-08-03 ENCOUNTER — Other Ambulatory Visit: Payer: Self-pay | Admitting: Cardiology

## 2022-08-03 MED ORDER — NEXLIZET 180-10 MG PO TABS: 1.0000 | ORAL_TABLET | Freq: Every day | ORAL | 0 refills | Status: DC

## 2022-08-05 ENCOUNTER — Ambulatory Visit: Payer: BC Managed Care – PPO | Attending: Cardiology

## 2022-08-05 DIAGNOSIS — Z5181 Encounter for therapeutic drug level monitoring: Secondary | ICD-10-CM

## 2022-08-05 DIAGNOSIS — Z952 Presence of prosthetic heart valve: Secondary | ICD-10-CM

## 2022-08-05 LAB — POCT INR: INR: 3.1 — AB (ref 2.0–3.0)

## 2022-08-05 NOTE — Patient Instructions (Addendum)
    Description    Continue on same dosage 1.5 tablets daily except 1 tablet Mondays and Fridays. Recheck INR in 4 weeks

## 2022-08-06 ENCOUNTER — Telehealth: Payer: Self-pay

## 2022-08-06 NOTE — Telephone Encounter (Signed)
Spoke to patient. He stated that he had a sleep study years ago but did not feel that it was accurate. He would like repeat study.

## 2022-08-07 ENCOUNTER — Ambulatory Visit (INDEPENDENT_AMBULATORY_CARE_PROVIDER_SITE_OTHER): Payer: BC Managed Care – PPO | Admitting: Adult Health

## 2022-08-07 ENCOUNTER — Encounter: Payer: Self-pay | Admitting: Adult Health

## 2022-08-07 VITALS — BP 120/62 | HR 75 | Ht 67.0 in | Wt 177.6 lb

## 2022-08-07 DIAGNOSIS — G4733 Obstructive sleep apnea (adult) (pediatric): Secondary | ICD-10-CM | POA: Diagnosis not present

## 2022-08-07 DIAGNOSIS — R400 Somnolence: Secondary | ICD-10-CM

## 2022-08-07 DIAGNOSIS — R0683 Snoring: Secondary | ICD-10-CM

## 2022-08-07 NOTE — Progress Notes (Signed)
@Patient  ID: Henry Duncan, male    DOB: 01/13/1991, 32 y.o.   MRN: [id number]  Chief Complaint  Patient presents with   sleep consult    Referring provider: Glean Hess, MD  HPI: 32 year old male seen for sleep consult August 07, 2022 to establish for sleep apnea Medical history significant for severe hypertension, coronary artery disease, thoracic aortic aneurysm status post dissection status post Bentall procedure, status post aortic valve replacement.  Postop developed a pericardial effusion requiring a pericardial window in 2020. Followed by dermatology for psoriasis.  TEST/EVENTS :   08/07/2022 Sleep consult  Patient presents for a sleep consult today to establish for sleep apnea.  Patient says he was diagnosed with sleep apnea around 6 years ago.  Says he took a home sleep study and was told that he had sleep apnea and started on a CPAP machine.  He does not have a copy of his sleep study and does not remember where this was done at.  Patient says he tried the CPAP machine for a few weeks but felt it was uncomfortable and pressures were too high.  And did not continue on CPAP.  Patient says he has ongoing snoring, daytime sleepiness very restless sleep, witnessed apneic events.  Patient typically goes to bed about 9 PM.  Takes about 15 minutes to go to sleep.  Is up 6 or more times at night.  Says he tosses and turns all night long.  Gets up at 4 AM.  Patient does not have a CD[employer] license but does operate forklift at work.  Occasionally takes a Unisom to help with sleep but it does not help at all.  Takes and minimal caffeine intake.  No history of congestive heart failure or stroke.  Patient does not nap. As above patient has an extensive cardiac history and is followed by cardiology. Epworth score is 11 out of 24.  Typically gets sleepy if he sits down to watch TV or in the evening hours and after eating lunch.   No Known Allergies  Immunization History  Administered  Date(s) Administered   Hepatitis B, ADULT 09/02/2015, 11/06/2015, 03/03/2016   Tdap 09/02/2011, 09/30/2021    Past Medical History:  Diagnosis Date   Allergy    Anxiety    Aortic aneurysm ([place]) 09/16/2018   a. 08/2018 Asc Ao/root aneuryms w/ dissection-->s/p Bentall w/ 24mm Hemashield graft, 72mm SJM AVR, and reimplantation of cor arteries; b. 04/2021 CTA Chest: stable post-op changes.   CAD (coronary artery disease)    a. 08/2018 Cor CTA: Ca2+ 99th%'ile (476). Calcified plaque in mLAD. Nl LM, LCX, RCA. S/p reimplantation of coronary arteries in setting of Bentall; b. 05/2021 Cor CTA: LAD >57m (nl FFRct); c. 05/2021 Cath: LM nl, LAD 40p, 62m, LCX/OM1 min irregs, RCA nl->Med rx.   H/O mechanical aortic valve replacement    a. 08/2018 in setting of bicuspid AoV and Asc Ao dissection-->52mm SJM mech AVR; b. 03/2020 Echo: EF 55-60%. No rwma. Mild LVH. Nl RV fxn. Nl functioning AVR.   Hyperlipidemia    Hypertension    Pericardial effusion    a. 08/2018 post-op -->s/p window.    Patient Active Problem List   Diagnosis Date Noted   OSA (obstructive sleep apnea) 08/07/2022   Esophagitis, erosive 05/26/2022   Chronic GERD 05/26/2022   OSA on CPAP 04/15/2022   Plaque psoriasis 04/14/2022   Mixed hyperlipidemia 09/09/2021   Onychodystrophy 06/25/2021   Neoplasm of uncertain behavior of skin 06/25/2021   Generalized  anxiety disorder 06/25/2021    No benefit from Sertraline, Celexa, Effexor    Abnormal cardiac CT angiography    Coronary artery disease involving native coronary artery of native heart     05/2021 Cardiac Cath:   Prox LAD lesion is 40% stenosed.   Mid LAD lesion is 50% stenosed.   1.  Moderate one-vessel coronary artery disease involving the proximal and mid LAD not significant by fractional flow reserve evaluation with an IFR of 0.95. 2.  Normal-appearing mechanical aortic valve. 3.  Difficult catheterization via the right radial artery due to arm discomfort and difficulty  engaging the coronary arteries.  Consider left radial access or femoral access in the future if needed.       Warfarin anticoagulation 03/15/2019   Encounter for therapeutic drug monitoring 09/29/2018   Aortic valve prosthesis present 09/29/2018    St Jude prosthetic valve placed for bicuspic aortic valve with ascending aortic aneurysm Warfarin therapy indefinitely    S/P ascending aortic aneurysm repair 09/19/2018    Thoracic aortic dissection: He underwent surgical repair for dissection of the anteromedial ascending thoracic aorta on 09/19/2018.  He is status post Bentall procedure with a 23 mm St. Jude mechanical valve.  He had a pericardial effusion postoperatively and underwent a pericardial window. Continue Lopressor 75 mg twice daily and warfarin.     Allergic rhinitis 09/06/2018   Hepatic steatosis 10/30/2016    On [place] 2018 for elevated liver tests    Insomnia 11/29/2015    Tried Trazodone, Ambien    Migraine 11/29/2015     Tobacco History: Social History   Tobacco Use  Smoking Status Never  Smokeless Tobacco Never   Counseling given: Not Answered  Social Hx patient is a never smoker.  Drinks alcohol socially.  No history of drug use.  Patient is married.  Has 1 child.  Lives at home with his wife and child.  Works for [employer].  Family history is negative.  Past Surgical History:  Procedure Laterality Date   BENTALL PROCEDURE N/A 09/19/2018   Procedure: BENTALL PROCEDURE WITH [address] [place].;  Surgeon: Gaye Pollack, MD;  Location: [facility];  Service: Open Heart Surgery;  Laterality: N/A;   CARDIAC SURGERY     CLEFT LIP REPAIR     CORONARY ANGIOGRAPHY Left 06/13/2021   Procedure: CORONARY ANGIOGRAPHY (CATH LAB);  Surgeon: Wellington Hampshire, MD;  Location: [facility];  Service: Cardiovascular;  Laterality: Left;   CORONARY PRESSURE WIRE/FFR WITH 3D MAPPING N/A 06/13/2021   Procedure: Coronary Pressure Wire, IFR;  Surgeon: Wellington Hampshire, MD;  Location: [place] [facility];  Service: Cardiovascular;  Laterality: N/A;   ESOPHAGOGASTRODUODENOSCOPY (EGD) WITH PROPOFOL N/A 05/26/2022   Procedure: ESOPHAGOGASTRODUODENOSCOPY (EGD) WITH PROPOFOL;  Surgeon: Lin Landsman, MD;  Location: [facility];  Service: Gastroenterology;  Laterality: N/A;   SUBXYPHOID PERICARDIAL WINDOW N/A 09/26/2018   Procedure: SUBXYPHOID PERICARDIAL WINDOW;  Surgeon: Gaye Pollack, MD;  Location: [facility];  Service: Thoracic;  Laterality: N/A;   TEE WITHOUT CARDIOVERSION N/A 09/19/2018   Procedure: TRANSESOPHAGEAL ECHOCARDIOGRAM (TEE);  Surgeon: Gaye Pollack, MD;  Location: [facility];  Service: Open Heart Surgery;  Laterality: N/A;   WISDOM TOOTH EXTRACTION       Outpatient Medications Prior to Visit  Medication Sig Dispense Refill   ALPRAZolam (XANAX) 0.5 MG tablet Take 1 tablet (0.5 mg total) by mouth 2 (two) times daily as needed for anxiety. 15 tablet 5  atorvastatin (LIPITOR) 80 MG tablet Take 1 tablet (80 mg total) by mouth daily. 90 tablet 3   Bempedoic Acid-Ezetimibe (NEXLIZET) 180-10 MG TABS Take 1 tablet by mouth daily. 30 tablet 0   busPIRone (BUSPAR) 5 MG tablet TAKE ONE TABLET BY MOUTH TWICE A DAY 180 tablet 3   cetirizine (ZYRTEC) 10 MG tablet Take 10 mg by mouth daily as needed for allergies.     clobetasol cream (TEMOVATE) [id number] % Apply 1 application topically 2 (two) times daily. To external ear canals 30 g 0   fenofibrate (TRICOR) 145 MG tablet Take 1 tablet (145 mg total) by mouth daily. 30 tablet 3   isosorbide mononitrate (IMDUR) 30 MG 24 hr tablet Take 1 tablet (30 mg total) by mouth daily. 30 tablet 3   methocarbamol (ROBAXIN) 500 MG tablet Take 1 tablet (500 mg total) by mouth at bedtime. 30 tablet 0   metoprolol succinate (TOPROL-XL) 50 MG 24 hr tablet Take 1 1/2 tabs (75mg ) by mouth twice a day 270 tablet 0   omeprazole (PRILOSEC) 40 MG capsule Take 1 capsule (40 mg total) by mouth 2 (two) times daily before a meal.  60 capsule 2   OTEZLA 30 MG TABS Take 1 tablet by mouth 2 (two) times daily.     warfarin (COUMADIN) 5 MG tablet Take 1 tablet to 1.5 tablets by mouth daily or as directed by the Anticoagulation Clinic. 135 tablet 1   ZORYVE 0.3 % CREA SMARTSIG:sparingly Topical Daily     No facility-administered medications prior to visit.     Review of Systems:   Constitutional:   No  weight loss, night sweats,  Fevers, chills, + fatigue, or  lassitude.  HEENT:   No headaches,  Difficulty swallowing,  Tooth/dental problems, or  Sore throat,                No sneezing, itching, ear ache, nasal congestion, post nasal drip,   CV:  No chest pain,  Orthopnea, PND, swelling in lower extremities, anasarca, dizziness, palpitations, syncope.   GI  No heartburn, indigestion, abdominal pain, nausea, vomiting, diarrhea, change in bowel habits, loss of appetite, bloody stools.   Resp: No shortness of breath with exertion or at rest.  No excess mucus, no productive cough,  No non-productive cough,  No coughing up of blood.  No change in color of mucus.  No wheezing.  No chest wall deformity  Skin: no rash or lesions.  GU: no dysuria, change in color of urine, no urgency or frequency.  No flank pain, no hematuria   MS:  No joint pain or swelling.  No decreased range of motion.  No back pain.    Physical Exam  BP 120/62 (BP Location: Left Arm, Cuff Size: Normal)   Pulse 75   Ht 5\' 7"  (1.702 m)   Wt 177 lb 9.6 oz (80.6 kg)   SpO2 97%   BMI 27.82 kg/m   GEN: A/Ox3; pleasant , NAD, well nourished    HEENT:  [place]/AT,  , NOSE-clear, THROAT-clear, no lesions, no postnasal drip or exudate noted.  Class III MP airway, full beard and mustache  NECK:  Supple w/ fair ROM; no JVD; normal carotid impulses w/o bruits; no thyromegaly or nodules palpated; no lymphadenopathy.    RESP  Clear  P & A; w/o, wheezes/ rales/ or rhonchi. no accessory muscle use, no dullness to percussion  CARD:  RRR, no m/r/g, no peripheral  edema, pulses intact, no cyanosis or clubbing.  GI:   Soft & nt; nml bowel sounds; no organomegaly or masses detected.   Musco: Warm bil, no deformities or joint swelling noted.   Neuro: alert, no focal deficits noted.    Skin: Warm, no lesions or rashes    Lab Results:  CBC    BNP No results found for: "BNP"  ProBNP No results found for: "PROBNP"  Imaging: No results found.        No data to display          No results found for: "NITRICOXIDE"      Assessment & Plan:   OSA (obstructive sleep apnea) History of previous sleep apnea with ongoing snoring, daytime sleepiness, witnessed apneic events all suspicious for sleep apnea.  Will set patient up for a home sleep study.  Patient education is provided.  - discussed how weight can impact sleep and risk for sleep disordered breathing - discussed options to assist with weight loss: combination of diet modification, cardiovascular and strength training exercises   - had an extensive discussion regarding the adverse health consequences related to untreated sleep disordered breathing - specifically discussed the risks for hypertension, coronary artery disease, cardiac dysrhythmias, cerebrovascular disease, and diabetes - lifestyle modification discussed   - discussed how sleep disruption can increase risk of accidents, particularly when driving - safe driving practices were discussed   Plan  Patient Instructions  Set up for home sleep study  Work on healthy weight loss  Do not drive if sleepy  Healthy sleep regimen  Follow up in 2 months to discuss sleep study results       Rexene Edison, NP 08/07/2022

## 2022-08-07 NOTE — Patient Instructions (Signed)
Set up for home sleep study  Work on healthy weight loss  Do not drive if sleepy  Healthy sleep regimen  Follow up in 2 months to discuss sleep study results

## 2022-08-07 NOTE — Assessment & Plan Note (Signed)
History of previous sleep apnea with ongoing snoring, daytime sleepiness, witnessed apneic events all suspicious for sleep apnea.  Will set patient up for a home sleep study.  Patient education is provided.  - discussed how weight can impact sleep and risk for sleep disordered breathing - discussed options to assist with weight loss: combination of diet modification, cardiovascular and strength training exercises   - had an extensive discussion regarding the adverse health consequences related to untreated sleep disordered breathing - specifically discussed the risks for hypertension, coronary artery disease, cardiac dysrhythmias, cerebrovascular disease, and diabetes - lifestyle modification discussed   - discussed how sleep disruption can increase risk of accidents, particularly when driving - safe driving practices were discussed   Plan  Patient Instructions  Set up for home sleep study  Work on healthy weight loss  Do not drive if sleepy  Healthy sleep regimen  Follow up in 2 months to discuss sleep study results

## 2022-08-09 NOTE — Progress Notes (Signed)
Reviewed and agree with assessment/plan.   Chesley Mires, MD [facility] Pulmonary/Critical Care 08/09/2022, 11:43 AM Pager:  [phone]

## 2022-08-10 ENCOUNTER — Other Ambulatory Visit: Payer: Self-pay

## 2022-08-10 DIAGNOSIS — I2510 Atherosclerotic heart disease of native coronary artery without angina pectoris: Secondary | ICD-10-CM

## 2022-08-10 MED ORDER — ISOSORBIDE MONONITRATE ER 30 MG PO TB24: 30.0000 mg | ORAL_TABLET | Freq: Every day | ORAL | 0 refills | Status: DC

## 2022-08-20 ENCOUNTER — Other Ambulatory Visit: Payer: Self-pay | Admitting: Gastroenterology

## 2022-08-20 NOTE — Telephone Encounter (Signed)
EGD report said to Omeprazole to take medication twice a day for 3 months. Please advise the patient needs to continue this

## 2022-08-21 ENCOUNTER — Telehealth: Payer: Self-pay | Admitting: Cardiology

## 2022-08-21 NOTE — Telephone Encounter (Signed)
Pt c/o medication issue:  1. Name of Medication: warfarin (COUMADIN) 5 MG tablet   2. How are you currently taking this medication (dosage and times per day)? As written   3. Are you having a reaction (difficulty breathing--STAT)?   4. What is your medication issue?  Pt wife called in stating accidentally doubled up this medication last night. She states they called poison control and they said he would be fine. They told them to call MD and see if he should do anything. Please advise.

## 2022-08-21 NOTE — Telephone Encounter (Signed)
Called pt who stated that he uses 5mg  warfarin tablets and took 2.5 tablets of warfarin yesterday instead of 1.5 tablets. Pt was suppose to take 1 tablet today.   Instructed pt to hold warfarin today and rescheduled pt to come in 4/3 to have INR check.

## 2022-08-24 ENCOUNTER — Encounter: Payer: Self-pay | Admitting: Cardiology

## 2022-08-24 MED ORDER — NEXLIZET 180-10 MG PO TABS: 1.0000 | ORAL_TABLET | Freq: Every day | ORAL | 0 refills | Status: DC

## 2022-08-24 MED ORDER — METOPROLOL SUCCINATE ER 50 MG PO TB24: ORAL_TABLET | ORAL | 0 refills | Status: DC

## 2022-08-26 ENCOUNTER — Ambulatory Visit: Payer: BC Managed Care – PPO

## 2022-09-02 ENCOUNTER — Ambulatory Visit: Payer: BC Managed Care – PPO | Attending: Cardiology

## 2022-09-02 DIAGNOSIS — Z5181 Encounter for therapeutic drug level monitoring: Secondary | ICD-10-CM | POA: Diagnosis not present

## 2022-09-02 DIAGNOSIS — Z952 Presence of prosthetic heart valve: Secondary | ICD-10-CM | POA: Diagnosis not present

## 2022-09-02 LAB — POCT INR: INR: 2 (ref 2.0–3.0)

## 2022-09-02 NOTE — Patient Instructions (Signed)
Description   Take 2 tablets today, then resume same dosage 1.5 tablets daily except 1 tablet Mondays and Fridays. Recheck INR in 3 weeks

## 2022-09-11 ENCOUNTER — Other Ambulatory Visit
Admission: RE | Admit: 2022-09-11 | Discharge: 2022-09-11 | Disposition: A | Discharge status: Home / Self Care | Payer: BC Managed Care – PPO | Attending: Cardiology | Admitting: Cardiology

## 2022-09-11 DIAGNOSIS — I2510 Atherosclerotic heart disease of native coronary artery without angina pectoris: Secondary | ICD-10-CM | POA: Diagnosis not present

## 2022-09-11 DIAGNOSIS — E782 Mixed hyperlipidemia: Secondary | ICD-10-CM | POA: Insufficient documentation

## 2022-09-11 DIAGNOSIS — G4730 Sleep apnea, unspecified: Secondary | ICD-10-CM | POA: Diagnosis not present

## 2022-09-11 LAB — LIPID PANEL
Cholesterol: 123 mg/dL (ref 0–200)
HDL: 36 mg/dL — ABNORMAL LOW (ref >40)
LDL Cholesterol: 52 mg/dL (ref 0–99)
Total CHOL/HDL Ratio: 3.4 RATIO
Triglycerides: 175 mg/dL — ABNORMAL HIGH (ref <150)
VLDL: 35 mg/dL (ref 0–40)

## 2022-09-16 ENCOUNTER — Encounter: Payer: Self-pay | Admitting: Cardiology

## 2022-09-16 ENCOUNTER — Ambulatory Visit: Payer: BC Managed Care – PPO | Attending: Cardiology | Admitting: Cardiology

## 2022-09-16 VITALS — BP 119/77 | HR 87 | Ht 67.0 in | Wt 175.4 lb

## 2022-09-16 DIAGNOSIS — I2510 Atherosclerotic heart disease of native coronary artery without angina pectoris: Secondary | ICD-10-CM

## 2022-09-16 DIAGNOSIS — Z952 Presence of prosthetic heart valve: Secondary | ICD-10-CM

## 2022-09-16 DIAGNOSIS — E782 Mixed hyperlipidemia: Secondary | ICD-10-CM | POA: Diagnosis not present

## 2022-09-16 DIAGNOSIS — I712 Thoracic aortic aneurysm, without rupture, unspecified: Secondary | ICD-10-CM | POA: Diagnosis not present

## 2022-09-16 MED ORDER — ISOSORBIDE MONONITRATE ER 30 MG PO TB24: 30.0000 mg | ORAL_TABLET | Freq: Two times a day (BID) | ORAL | 0 refills | Status: DC

## 2022-09-16 MED ORDER — FENOFIBRATE 145 MG PO TABS: 145.0000 mg | ORAL_TABLET | Freq: Every day | ORAL | 3 refills | Status: DC

## 2022-09-16 NOTE — Progress Notes (Signed)
Cardiology Office Note:    Date:  09/16/2022   ID:  Henry Duncan, DOB 07-12-1990, MRN [id number]  PCP:  Henry Milan, MD  [facility] Cardiologist:  Debbe Odea, MD  [facility] Electrophysiologist:  None   Referring MD: Henry Milan, MD   No chief complaint on file.   History of Present Illness:    Henry Duncan is a 32 y.o. male with a hx of hypertension, CAD (lhc 05/2021-50% mid LAD), aortic aneurysm, bicuspid aortic valve, ascending thoracic aorta dissection s/p Bentall procedure (with 23 mm St. Jude's mechanical valve, developed pericardial effusion postoperatively and underwent pericardial window 08/2018), mixed hyperlipidemia who presents for follow-up.  Started on nexlizet after last visit due to elevated LDL.  Repeat lipid panel obtained.  Compliant with medicines as prescribed.  States having occasional chest pain, not related with exertion.  Currently on Toprol-XL, Imdur.  Prior notes LHC 05/2021 mid LAD 50%.  IFFR 0.95   CCTA 05/2021 severe mid LAD stenosis, calcium score 958.  FFRct did not show significant stenosis TTE 03/2020 EF 55 to 60%, normal functioning prosthetic valve.  Patient presented to the hospital on 09/13/2018 due to chest pain.  CT did reveal ascending thoracic aorta aneurysm measuring 5.7 to 5.8 cm, localized partial dissection of the anterior middle ascending thoracic aorta.  He was subsequently taken for surgery and underwent Bentall procedure.  Last transthoracic echocardiogram 09/16/2018 showed normal systolic and diastolic function, EF 60 to 65%.  Intraoperative TEE on 09/19/2018 showed normal bioprosthetic valve with normal washing jets, transaortic gradient 7 mmHg.  Coronary CTA 2020, calcium score 476, moderate mid LAD disease   Past Medical History:  Diagnosis Date   Allergy    Anxiety    Aortic aneurysm 09/16/2018   a. 08/2018 Asc Ao/root aneuryms w/ dissection-->s/p Bentall w/ 26mm Hemashield graft, 23mm SJM  AVR, and reimplantation of cor arteries; b. 04/2021 CTA Chest: stable post-op changes.   CAD (coronary artery disease)    a. 08/2018 Cor CTA: Ca2+ 99th%'ile (476). Calcified plaque in mLAD. Nl LM, LCX, RCA. S/p reimplantation of coronary arteries in setting of Bentall; b. 05/2021 Cor CTA: LAD >37m (nl FFRct); c. 05/2021 Cath: LM nl, LAD 40p, 101m, LCX/OM1 min irregs, RCA nl->Med rx.   H/O mechanical aortic valve replacement    a. 08/2018 in setting of bicuspid AoV and Asc Ao dissection-->20mm SJM mech AVR; b. 03/2020 Echo: EF 55-60%. No rwma. Mild LVH. Nl RV fxn. Nl functioning AVR.   Hyperlipidemia    Hypertension    Pericardial effusion    a. 08/2018 post-op -->s/p window.    Past Surgical History:  Procedure Laterality Date   BENTALL PROCEDURE N/A 09/19/2018   Procedure: BENTALL PROCEDURE WITH 23 ST. JUDE GRAFT CONDUIT.;  Surgeon: Alleen Borne, MD;  Location: [facility] OR;  Service: Open Heart Surgery;  Laterality: N/A;   CARDIAC SURGERY     CLEFT LIP REPAIR     CORONARY ANGIOGRAPHY Left 06/13/2021   Procedure: CORONARY ANGIOGRAPHY (CATH LAB);  Surgeon: Iran Ouch, MD;  Location: [facility] INVASIVE CV LAB;  Service: Cardiovascular;  Laterality: Left;   CORONARY PRESSURE/FFR WITH 3D MAPPING N/A 06/13/2021   Procedure: Coronary Pressure Wire, IFR;  Surgeon: Iran Ouch, MD;  Location: [facility] INVASIVE CV LAB;  Service: Cardiovascular;  Laterality: N/A;   ESOPHAGOGASTRODUODENOSCOPY (EGD) WITH PROPOFOL N/A 05/26/2022   Procedure: ESOPHAGOGASTRODUODENOSCOPY (EGD) WITH PROPOFOL;  Surgeon: Toney Reil, MD;  Location: [facility] ENDOSCOPY;  Service: Gastroenterology;  Laterality:  N/A;   SUBXYPHOID PERICARDIAL WINDOW N/A 09/26/2018   Procedure: SUBXYPHOID PERICARDIAL WINDOW;  Surgeon: Alleen Borne, MD;  Location: [facility] OR;  Service: Thoracic;  Laterality: N/A;   TEE WITHOUT CARDIOVERSION N/A 09/19/2018   Procedure: TRANSESOPHAGEAL ECHOCARDIOGRAM (TEE);  Surgeon: Alleen Borne, MD;  Location: [facility] OR;   Service: Open Heart Surgery;  Laterality: N/A;   WISDOM TOOTH EXTRACTION      Current Medications: Current Meds  Medication Sig   ALPRAZolam (XANAX) 0.5 MG tablet Take 1 tablet (0.5 mg total) by mouth 2 (two) times daily as needed for anxiety.   atorvastatin (LIPITOR) 80 MG tablet Take 1 tablet (80 mg total) by mouth daily.   Bempedoic Acid-Ezetimibe (NEXLIZET) 180-10 MG TABS Take 1 tablet by mouth daily.   busPIRone (BUSPAR) 5 MG tablet TAKE ONE TABLET BY MOUTH TWICE A DAY   cetirizine (ZYRTEC) 10 MG tablet Take 10 mg by mouth daily as needed for allergies.   clobetasol cream (TEMOVATE) 0.05 % Apply 1 application topically 2 (two) times daily. To external ear canals   methocarbamol (ROBAXIN) 500 MG tablet Take 1 tablet (500 mg total) by mouth at bedtime.   metoprolol succinate (TOPROL-XL) 50 MG 24 hr tablet Take 1 1/2 tabs ( ) by mouth twice a day   omeprazole (PRILOSEC) 40 MG capsule TAKE ONE CAPSULE BY MOUTH TWICE A DAY BEFORE A MEAL   OTEZLA 30 MG TABS Take 1 tablet by mouth 2 (two) times daily.   warfarin (COUMADIN) 5 MG tablet Take 1 tablet to 1.5 tablets by mouth daily or as directed by the Anticoagulation Clinic.   ZORYVE 0.3 % CREA SMARTSIG:sparingly Topical Daily   [DISCONTINUED] fenofibrate (TRICOR) 145 MG tablet Take 1 tablet (145 mg total) by mouth daily.   [DISCONTINUED] isosorbide mononitrate (IMDUR) 30 MG 24 hr tablet Take 1 tablet (30 mg total) by mouth daily. PLEASE CALL TO SCHEDULE FOLLOW-UP APPOINTMENT FOR FURTHER REFILLS. THANK YOU!     Allergies:   Patient has no known allergies.   Social History   Socioeconomic History   Marital status: Married    Spouse name: Not on file   Number of children: 0   Years of education: Not on file   Highest education level: Not on file  Occupational History   Not on file  Tobacco Use   Smoking status: Never   Smokeless tobacco: Never  Vaping Use   Vaping Use: Former  Substance and Sexual Activity   Alcohol use: Yes    Drug use: Never   Sexual activity: Yes    Partners: Female    Birth control/protection: Condom  Other Topics Concern   Not on file  Social History Narrative   Not on file   Social Determinants of Health   Financial Resource Strain: Low Risk  (09/30/2021)   Overall Financial Resource Strain (CARDIA)    Difficulty of Paying Living Expenses: Not hard at all  Food Insecurity: No Food Insecurity (09/30/2021)   Hunger Vital Sign    Worried About Running Out of Food in the Last Year: Never true    Ran Out of Food in the Last Year: Never true  Transportation Needs: No Transportation Needs (09/30/2021)   PRAPARE - Administrator, Civil Service (Medical): No    Lack of Transportation (Non-Medical): No  Physical Activity: Not on file  Stress: Not on file  Social Connections: Not on file     Family History: The patient's family history includes Anxiety disorder in  his brother and father; Depression in his brother; Diabetes in his father; Heart attack in his mother.  ROS:   Please see the history of present illness.     All other systems reviewed and are negative.  EKGs/Labs/Other Studies Reviewed:    The following studies were reviewed today:   EKG:  EKG not ordered today.   Recent Labs: 09/30/2021: Hemoglobin 16.2; Platelets 216; TSH 0.732 12/18/2021: ALT 35 04/15/2022: BUN 16; Creatinine, Ser 0.62; Potassium 4.2; Sodium 137  Recent Lipid Panel    Component Value Date/Time   CHOL 123 09/11/2022 0653   CHOL 211 (H) 09/30/2021 1118   TRIG 175 (H) 09/11/2022 0653   HDL 36 (L) 09/11/2022 0653   HDL 46 09/30/2021 1118   CHOLHDL 3.4 09/11/2022 0653   VLDL 35 09/11/2022 0653   LDLCALC 52 09/11/2022 0653   LDLCALC 133 (H) 09/30/2021 1118     Risk Assessment/Calculations:      Physical Exam:    VS:  BP 119/77 (BP Location: Left Arm, Patient Position: Sitting, Cuff Size: Normal)   Pulse 87   Ht  (1.702 m)   Wt 175 lb 6.4 oz (79.6 kg)   SpO2 96%   BMI 27.47  kg/m     Wt Readings from Last 3 Encounters:  09/16/22 175 lb 6.4 oz (79.6 kg)  08/07/22 177 lb 9.6 oz (80.6 kg)  05/26/22 181 lb 3.2 oz (82.2 kg)     GEN:  Well nourished, well developed in no acute distress HEENT: Normal NECK: No JVD; No carotid bruits CARDIAC: RRR, 2/6 systolic murmur, mechanical click consistent with mechanical valve noted. RESPIRATORY:  Clear to auscultation without rales, wheezing or rhonchi  ABDOMEN: Soft, non-tender, non-distended MUSCULOSKELETAL:  No edema; No deformity  SKIN: Warm and dry NEUROLOGIC:  Alert and oriented x 3 PSYCHIATRIC:  Normal affect   ASSESSMENT:    1. Coronary artery disease involving native coronary artery of native heart, unspecified whether angina present   2. Thoracic aortic aneurysm without rupture, unspecified part   3. H/O mechanical aortic valve replacement   4. Mixed hyperlipidemia    PLAN:    In order of problems listed above:  CAD, LHC 05/2021 mid LAD 50%.  IFFR 0.95.  Has occasional chest pain.  Increase Imdur to 30 mg twice daily.  Continue Toprol-XL, Lipitor, warfarin.  aortic root aneurysm, dissection, s/p Bentall procedure with aortic root replacement and mechanical aortic valve (08/2018).  Chest CT with stable aorta size.  Continue Toprol xl 75 mg twice daily. History of mechanical aortic valve replacement, normal aortic valve prosthesis on echo 03/2022.  EF 60 to 65%. hyperlipidemia, LDL much improved, now controlled.  Triglycerides elevated,.  Continue Nexlizet, Lipitor  80 mg daily, fenofibrate.    Follow-up in 4 months  Total encounter time 40 minutes  Greater than 50% was spent in counseling and coordination of care with the patient   Medication Adjustments/Labs and Tests Ordered: Current medicines are reviewed at length with the patient today.  Concerns regarding medicines are outlined above.  No orders of the defined types were placed in this encounter.  Meds ordered this encounter  Medications    fenofibrate (TRICOR) 145 MG tablet    Sig: Take 1 tablet (145 mg total) by mouth daily.    Dispense:  30 tablet    Refill:  3   isosorbide mononitrate (IMDUR) 30 MG 24 hr tablet    Sig: Take 1 tablet (30 mg total) by mouth in the morning  and at bedtime.    Dispense:  180 tablet    Refill:  0    Dosage increase    Patient Instructions  Medication Instructions:   INCREASE Imdur - Take one tablet ( 30 mg) twice a day.   *If you need a refill on your cardiac medications before your next appointment, please call your pharmacy*   Lab Work:  None Ordered  If you have labs (blood work) drawn today and your tests are completely normal, you will receive your results only by: MyChart Message (if you have MyChart) OR A paper copy in the mail If you have any lab test that is abnormal or we need to change your treatment, we will call you to review the results.   Testing/Procedures:  None Ordered    Follow-Up: At [facility], you and your health needs are our priority.  As part of our continuing mission to provide you with exceptional heart care, we have created designated Provider Care Teams.  These Care Teams include your primary Cardiologist (physician) and Advanced Practice Providers (APPs -  Physician Assistants and Nurse Practitioners) who all work together to provide you with the care you need, when you need it.  We recommend signing up for the patient portal called "MyChart".  Sign up information is provided on this After Visit Summary.  MyChart is used to connect with patients for Virtual Visits (Telemedicine).  Patients are able to view lab/test results, encounter notes, upcoming appointments, etc.  Non-urgent messages can be sent to your provider as well.   To learn more about what you can do with MyChart, go to [email or web address].    Your next appointment:   4 month(s)  Provider:   You may see Debbe Odea, MD or one of the following Advanced Practice  Providers on your designated Care Team:   Nicolasa Ducking, NP Eula Listen, PA-C Cadence Fransico Michael, PA-C Charlsie Quest, NP    Signed, Debbe Odea, MD  09/16/2022 10:44 AM    [facility]

## 2022-09-16 NOTE — Patient Instructions (Signed)
Medication Instructions:   INCREASE Imdur - Take one tablet ( 30 mg) twice a day.   *If you need a refill on your cardiac medications before your next appointment, please call your pharmacy*   Lab Work:  None Ordered  If you have labs (blood work) drawn today and your tests are completely normal, you will receive your results only by: MyChart Message (if you have MyChart) OR A paper copy in the mail If you have any lab test that is abnormal or we need to change your treatment, we will call you to review the results.   Testing/Procedures:  None Ordered    Follow-Up: At [facility], you and your health needs are our priority.  As part of our continuing mission to provide you with exceptional heart care, we have created designated Provider Care Teams.  These Care Teams include your primary Cardiologist (physician) and Advanced Practice Providers (APPs -  Physician Assistants and Nurse Practitioners) who all work together to provide you with the care you need, when you need it.  We recommend signing up for the patient portal called "MyChart".  Sign up information is provided on this After Visit Summary.  MyChart is used to connect with patients for Virtual Visits (Telemedicine).  Patients are able to view lab/test results, encounter notes, upcoming appointments, etc.  Non-urgent messages can be sent to your provider as well.   To learn more about what you can do with MyChart, go to [email or web address].    Your next appointment:   4 month(s)  Provider:   You may see Debbe Odea, MD or one of the following Advanced Practice Providers on your designated Care Team:   Nicolasa Ducking, NP Eula Listen, PA-C Cadence Fransico Michael, PA-C Charlsie Quest, NP

## 2022-09-21 ENCOUNTER — Encounter (INDEPENDENT_AMBULATORY_CARE_PROVIDER_SITE_OTHER): Payer: BC Managed Care – PPO

## 2022-09-21 ENCOUNTER — Other Ambulatory Visit: Payer: Self-pay

## 2022-09-21 DIAGNOSIS — R0683 Snoring: Secondary | ICD-10-CM

## 2022-09-21 DIAGNOSIS — G4733 Obstructive sleep apnea (adult) (pediatric): Secondary | ICD-10-CM

## 2022-09-21 DIAGNOSIS — R400 Somnolence: Secondary | ICD-10-CM

## 2022-09-21 MED ORDER — NEXLIZET 180-10 MG PO TABS: 1.0000 | ORAL_TABLET | Freq: Every day | ORAL | 3 refills | Status: DC

## 2022-09-22 NOTE — Telephone Encounter (Signed)
The sleep study has been scanned into his chart.

## 2022-09-22 NOTE — Telephone Encounter (Signed)
Shows mild sleep apnea.  Please set up office visit or virtual visit to discuss sleep study results.  Can be in person or virtual and can double book

## 2022-09-23 ENCOUNTER — Ambulatory Visit: Payer: BC Managed Care – PPO | Attending: Cardiology | Admitting: *Deleted

## 2022-09-23 DIAGNOSIS — Z952 Presence of prosthetic heart valve: Secondary | ICD-10-CM | POA: Diagnosis not present

## 2022-09-23 DIAGNOSIS — Z5181 Encounter for therapeutic drug level monitoring: Secondary | ICD-10-CM

## 2022-09-23 LAB — POCT INR: INR: 3 (ref 2.0–3.0)

## 2022-09-23 NOTE — Patient Instructions (Signed)
Continue warfarin 1.5 tablets daily except 1 tablet Mondays and Fridays. Recheck INR in 4 weeks

## 2022-10-05 ENCOUNTER — Encounter: Payer: Self-pay | Admitting: Internal Medicine

## 2022-10-05 ENCOUNTER — Ambulatory Visit (INDEPENDENT_AMBULATORY_CARE_PROVIDER_SITE_OTHER): Payer: BC Managed Care – PPO | Admitting: Internal Medicine

## 2022-10-05 VITALS — BP 122/70 | HR 70 | Ht 67.0 in | Wt 173.0 lb

## 2022-10-05 DIAGNOSIS — K219 Gastro-esophageal reflux disease without esophagitis: Secondary | ICD-10-CM | POA: Diagnosis not present

## 2022-10-05 DIAGNOSIS — F411 Generalized anxiety disorder: Secondary | ICD-10-CM

## 2022-10-05 DIAGNOSIS — G4733 Obstructive sleep apnea (adult) (pediatric): Secondary | ICD-10-CM

## 2022-10-05 DIAGNOSIS — Z131 Encounter for screening for diabetes mellitus: Secondary | ICD-10-CM | POA: Diagnosis not present

## 2022-10-05 DIAGNOSIS — I2510 Atherosclerotic heart disease of native coronary artery without angina pectoris: Secondary | ICD-10-CM

## 2022-10-05 DIAGNOSIS — Z0000 Encounter for general adult medical examination without abnormal findings: Secondary | ICD-10-CM

## 2022-10-05 DIAGNOSIS — E782 Mixed hyperlipidemia: Secondary | ICD-10-CM

## 2022-10-05 MED ORDER — ALPRAZOLAM 0.5 MG PO TABS
0.5000 mg | ORAL_TABLET | Freq: Two times a day (BID) | ORAL | 5 refills | Status: DC | PRN
Start: 2022-10-05 — End: 2023-03-02

## 2022-10-05 NOTE — Progress Notes (Signed)
Date:  10/05/2022   Name:  Henry Duncan   DOB:  1990/11/09   MRN:  [id number]   Chief Complaint: Annual Exam Henry Duncan is a 32 y.o. male who presents today for his Complete Annual Exam. He feels well. He reports exercising - none. He reports he is sleeping fairly well.    Immunization History  Administered Date(s) Administered   Hepatitis B, ADULT 09/02/2015, 11/06/2015, 03/03/2016   Tdap 09/02/2011, 09/30/2021   There are no preventive care reminders to display for this patient.   No results found for: "PSA1", "PSA"   Gastroesophageal Reflux He complains of chest pain and heartburn. He reports no abdominal pain, no choking or no wheezing. This is a recurrent problem. The problem occurs rarely. Pertinent negatives include no fatigue. He has tried a PPI for the symptoms.  Chest Pain  This is a recurrent problem. The problem has been gradually improving (since increasing the dose of Imdur). Pertinent negatives include no abdominal pain, back pain, diaphoresis, dizziness, headaches, palpitations or shortness of breath.  Anxiety Presents for follow-up visit. Symptoms include chest pain. Patient reports no dizziness, nervous/anxious behavior, palpitations or shortness of breath. Symptoms occur occasionally. Nighttime awakenings: occasional.    OSA - he was using an old machine that did not adjust automatically.  He had a recent sleep study and is getting set up with new equipment.   Lab Results  Component Value Date   NA 137 04/15/2022   K 4.2 04/15/2022   CO2 25 04/15/2022   GLUCOSE 101 (H) 04/15/2022   BUN 16 04/15/2022   CREATININE 0.62 04/15/2022   CALCIUM 9.1 04/15/2022   EGFR 128 09/30/2021   GFRNONAA >60 04/15/2022   Lab Results  Component Value Date   CHOL 123 09/11/2022   HDL 36 (L) 09/11/2022   LDLCALC 52 09/11/2022   TRIG 175 (H) 09/11/2022   CHOLHDL 3.4 09/11/2022   Lab Results  Component Value Date   TSH 0.732 09/30/2021   Lab  Results  Component Value Date   HGBA1C 5.5 06/17/2021   Lab Results  Component Value Date   WBC 9.1 09/30/2021   HGB 16.2 09/30/2021   HCT 47.5 09/30/2021   MCV 91 09/30/2021   PLT 216 09/30/2021   Lab Results  Component Value Date   ALT 35 12/18/2021   AST 29 12/18/2021   ALKPHOS 50 12/18/2021   BILITOT 0.9 12/18/2021   No results found for: "25OHVITD2", "25OHVITD3", "VD25OH"   Review of Systems  Constitutional:  Negative for appetite change, chills, diaphoresis, fatigue and unexpected weight change.  HENT:  Negative for hearing loss, tinnitus, trouble swallowing and voice change.   Eyes:  Negative for visual disturbance.  Respiratory:  Negative for choking, shortness of breath and wheezing.   Cardiovascular:  Positive for chest pain. Negative for palpitations and leg swelling.  Gastrointestinal:  Positive for heartburn. Negative for abdominal pain, blood in stool, constipation and diarrhea.  Genitourinary:  Negative for difficulty urinating, dysuria and frequency.  Musculoskeletal:  Negative for arthralgias, back pain and myalgias.  Skin:  Negative for color change and rash.  Neurological:  Negative for dizziness, syncope and headaches.  Hematological:  Negative for adenopathy.  Psychiatric/Behavioral:  Positive for sleep disturbance (OSA). Negative for dysphoric mood. The patient is not nervous/anxious.     Patient Active Problem List   Diagnosis Date Noted   Esophagitis, erosive 05/26/2022   Chronic GERD 05/26/2022   OSA on CPAP 04/15/2022   Plaque  psoriasis 04/14/2022   Mixed hyperlipidemia 09/09/2021   Onychodystrophy 06/25/2021   Neoplasm of uncertain behavior of skin 06/25/2021   Generalized anxiety disorder 06/25/2021   Abnormal cardiac CT angiography    Coronary artery disease involving native coronary artery of native heart    Warfarin anticoagulation 03/15/2019   Encounter for therapeutic drug monitoring 09/29/2018   Aortic valve prosthesis present  09/29/2018   S/P ascending aortic aneurysm repair 09/19/2018   Allergic rhinitis 09/06/2018   Hepatic steatosis 10/30/2016   Insomnia 11/29/2015   Migraine 11/29/2015    No Known Allergies  Past Surgical History:  Procedure Laterality Date   BENTALL PROCEDURE N/A 09/19/2018   Procedure: BENTALL PROCEDURE WITH 23 ST. JUDE GRAFT CONDUIT.;  Surgeon: Alleen Borne, MD;  Location: [facility] OR;  Service: Open Heart Surgery;  Laterality: N/A;   CARDIAC SURGERY     CLEFT LIP REPAIR     CORONARY ANGIOGRAPHY Left 06/13/2021   Procedure: CORONARY ANGIOGRAPHY (CATH LAB);  Surgeon: Iran Ouch, MD;  Location: [facility] INVASIVE CV LAB;  Service: Cardiovascular;  Laterality: Left;   CORONARY PRESSURE/FFR WITH 3D MAPPING N/A 06/13/2021   Procedure: Coronary Pressure Wire, IFR;  Surgeon: Iran Ouch, MD;  Location: [facility] INVASIVE CV LAB;  Service: Cardiovascular;  Laterality: N/A;   ESOPHAGOGASTRODUODENOSCOPY (EGD) WITH PROPOFOL N/A 05/26/2022   Procedure: ESOPHAGOGASTRODUODENOSCOPY (EGD) WITH PROPOFOL;  Surgeon: Toney Reil, MD;  Location: [facility] ENDOSCOPY;  Service: Gastroenterology;  Laterality: N/A;   SUBXYPHOID PERICARDIAL WINDOW N/A 09/26/2018   Procedure: SUBXYPHOID PERICARDIAL WINDOW;  Surgeon: Alleen Borne, MD;  Location: [facility] OR;  Service: Thoracic;  Laterality: N/A;   TEE WITHOUT CARDIOVERSION N/A 09/19/2018   Procedure: TRANSESOPHAGEAL ECHOCARDIOGRAM (TEE);  Surgeon: Alleen Borne, MD;  Location[facility];  Service: Open Heart Surgery;  Laterality: N/A;   WISDOM TOOTH EXTRACTION      Social History   Tobacco Use   Smoking status: Never   Smokeless tobacco: Never  Vaping Use   Vaping Use: Former  Substance Use Topics   Alcohol use: Yes   Drug use: Never     Medication list has been reviewed and updated.  Current Meds  Medication Sig   atorvastatin (LIPITOR) 80 MG tablet Take 1 tablet (80 mg total) by mouth daily.   Bempedoic Acid-Ezetimibe (NEXLIZET) 180-10 MG TABS  Take 1 tablet by mouth daily.   busPIRone (BUSPAR) 5 MG tablet TAKE ONE TABLET BY MOUTH TWICE A DAY   cetirizine (ZYRTEC) 10 MG tablet Take 10 mg by mouth daily as needed for allergies.   fenofibrate (TRICOR) 145 MG tablet Take 1 tablet (145 mg total) by mouth daily.   isosorbide mononitrate (IMDUR) 30 MG 24 hr tablet Take 1 tablet (30 mg total) by mouth in the morning and at bedtime.   methocarbamol (ROBAXIN) 500 MG tablet Take 1 tablet (500 mg total) by mouth at bedtime.   metoprolol succinate (TOPROL-XL) 50 MG 24 hr tablet Take 1 1/2 tabs (75mg ) by mouth twice a day   omeprazole (PRILOSEC) 40 MG capsule TAKE ONE CAPSULE BY MOUTH TWICE A DAY BEFORE A MEAL   OTEZLA 30 MG TABS Take 1 tablet by mouth 2 (two) times daily.   warfarin (COUMADIN) 5 MG tablet Take 1 tablet to 1.5 tablets by mouth daily or as directed by the Anticoagulation Clinic.   ZORYVE 0.3 % CREA SMARTSIG:sparingly Topical Daily   [DISCONTINUED] ALPRAZolam (XANAX) 0.5 MG tablet Take 1 tablet (0.5 mg total) by mouth 2 (two) times  daily as needed for anxiety.       10/05/2022    8:07 AM 02/25/2022    3:57 PM 01/06/2022   11:27 AM 09/30/2021   10:51 AM  GAD 7 : Generalized Anxiety Score  Nervous, Anxious, on Edge 0 1 1 0  Control/stop worrying 0 0 1 1  Worry too much - different things 0 0 2 0  Trouble relaxing 0 0 1 0  Restless 0 0 1 0  Easily annoyed or irritable 0 0 1 1  Afraid - awful might happen 0 0 2 0  Total GAD 7 Score 0 1 9 2   Anxiety Difficulty Not difficult at all Not difficult at all Somewhat difficult Not difficult at all       10/05/2022    8:07 AM 02/25/2022    3:56 PM 01/06/2022   11:27 AM  Depression screen PHQ 2/9  Decreased Interest 0 0 1  Down, Depressed, Hopeless 0 0 1  PHQ - 2 Score 0 0 2  Altered sleeping 2 0 0  Tired, decreased energy 1 0 0  Change in appetite 0 0 0  Feeling bad or failure about yourself  0 0 0  Trouble concentrating 0 0 0  Moving slowly or fidgety/restless 0 0 0  Suicidal  thoughts 0 0 0  PHQ-9 Score 3 0 2  Difficult doing work/chores Not difficult at all Not difficult at all Not difficult at all    BP Readings from Last 3 Encounters:  10/05/22 122/70  09/16/22 119/77  08/07/22 120/62    Physical Exam Vitals and nursing note reviewed.  Constitutional:      Appearance: Normal appearance. He is well-developed.  HENT:     Head: Normocephalic.     Right Ear: Tympanic membrane, ear canal and external ear normal.     Left Ear: Tympanic membrane, ear canal and external ear normal.     Nose: Nose normal.  Eyes:     Conjunctiva/sclera: Conjunctivae normal.     Pupils: Pupils are equal, round, and reactive to light.  Neck:     Thyroid: No thyromegaly.     Vascular: No carotid bruit.  Cardiovascular:     Rate and Rhythm: Normal rate and regular rhythm.     Heart sounds: Murmur (pan systolic mechanical valve click) heard.  Pulmonary:     Effort: Pulmonary effort is normal.     Breath sounds: Normal breath sounds. No wheezing.  Chest:  Breasts:    Right: No mass.     Left: No mass.  Abdominal:     General: Bowel sounds are normal.     Palpations: Abdomen is soft.     Tenderness: There is no abdominal tenderness.  Musculoskeletal:        General: Normal range of motion.     Cervical back: Normal range of motion and neck supple.  Lymphadenopathy:     Cervical: No cervical adenopathy.  Skin:    General: Skin is warm and dry.     Capillary Refill: Capillary refill takes less than 2 seconds.  Neurological:     General: No focal deficit present.     Mental Status: He is alert and oriented to person, place, and time.     Deep Tendon Reflexes: Reflexes are normal and symmetric.  Psychiatric:        Attention and Perception: Attention normal.        Mood and Affect: Mood normal.        Thought  Content: Thought content normal.     Wt Readings from Last 3 Encounters:  10/05/22 173 lb (78.5 kg)  09/16/22 175 lb 6.4 oz (79.6 kg)  08/07/22 177 lb 9.6  oz (80.6 kg)    BP 122/70   Pulse 70   Ht 5\' 7"  (1.702 m)   Wt 173 lb (78.5 kg)   SpO2 98%   BMI 27.10 kg/m   Assessment and Plan:  Problem List Items Addressed This Visit       Cardiovascular and Mediastinum   Coronary artery disease involving native coronary artery of native heart (Chronic)    Stable clinically  and followed closely by cardiology Recent increase in ImDur with some improvement in chest pain On BB and warfarin; INR monitored by the coumadin clinic On triple lipid lowering agents      Relevant Orders   Comprehensive metabolic panel     Respiratory   OSA on CPAP    New recent study Planning to get set up with an auto CPap in the near future.        Digestive   Chronic GERD    Recent EGD with esophagitis Recommended omeprazole bid      Relevant Orders   CBC with Differential/Platelet     Other   Generalized anxiety disorder (Chronic)    Clinically stable on current regimen with good control of symptoms, No SI or HI. No change in management at this time. Buspar and prn Xanax      Relevant Medications   ALPRAZolam (XANAX) 0.5 MG tablet   Other Relevant Orders   TSH   Mixed hyperlipidemia (Chronic)    Cholesterol controlled with statin, fenofibrate and Nexlizet. Lab Results  Component Value Date   LDLCALC 52 09/11/2022        Other Visit Diagnoses     Annual physical exam    -  Primary   up to date on immunizations declines Covid vaccine   Relevant Orders   CBC with Differential/Platelet   Comprehensive metabolic panel   Hemoglobin A1c   TSH   Screening for diabetes mellitus       Relevant Orders   Hemoglobin A1c       No follow-ups on file.   Partially dictated using Dragon software, any errors are not intentional.  Reubin Milan, MD [facility] [place], [place]

## 2022-10-05 NOTE — Patient Instructions (Signed)
-  It was a pleasure to see you today! Please review your visit summary for helpful information. -Lab results are usually available within 1-2 days and we will call once reviewed. -I would encourage you to follow your care via MyChart where you can access lab results, notes, messages, and more. -If you feel that we did a nice job today, please complete your after-visit survey and leave us a Google review! Your CMA today was Chassidy and your provider was Dr Cathern Tahir, MD.  

## 2022-10-05 NOTE — Assessment & Plan Note (Addendum)
Cholesterol controlled with statin, fenofibrate and Nexlizet. Lab Results  Component Value Date   LDLCALC 52 09/11/2022

## 2022-10-05 NOTE — Assessment & Plan Note (Signed)
New recent study Planning to get set up with an auto CPap in the near future.

## 2022-10-05 NOTE — Assessment & Plan Note (Signed)
Recent EGD with esophagitis Recommended omeprazole bid

## 2022-10-05 NOTE — Assessment & Plan Note (Signed)
Clinically stable on current regimen with good control of symptoms, No SI or HI. No change in management at this time. Buspar and prn Xanax

## 2022-10-05 NOTE — Assessment & Plan Note (Addendum)
Stable clinically  and followed closely by cardiology Recent increase in ImDur with some improvement in chest pain On BB and warfarin; INR monitored by the coumadin clinic On triple lipid lowering agents

## 2022-10-06 ENCOUNTER — Encounter: Payer: Self-pay | Admitting: Internal Medicine

## 2022-10-06 LAB — COMPREHENSIVE METABOLIC PANEL
ALT: 69 IU/L — ABNORMAL HIGH (ref 0–44)
AST: 65 IU/L — ABNORMAL HIGH (ref 0–40)
Albumin/Globulin Ratio: 2.2 (ref 1.2–2.2)
Albumin: 4.6 g/dL (ref 4.1–5.1)
Alkaline Phosphatase: 55 IU/L (ref 44–121)
BUN/Creatinine Ratio: 14 (ref 9–20)
BUN: 10 mg/dL (ref 6–20)
Bilirubin Total: 0.5 mg/dL (ref 0.0–1.2)
CO2: 22 mmol/L (ref 20–29)
Calcium: 9.5 mg/dL (ref 8.7–10.2)
Chloride: 100 mmol/L (ref 96–106)
Creatinine, Ser: 0.74 mg/dL — ABNORMAL LOW (ref 0.76–1.27)
Globulin, Total: 2.1 g/dL (ref 1.5–4.5)
Glucose: 85 mg/dL (ref 70–99)
Potassium: 4.3 mmol/L (ref 3.5–5.2)
Sodium: 138 mmol/L (ref 134–144)
Total Protein: 6.7 g/dL (ref 6.0–8.5)
eGFR: 124 mL/min/{1.73_m2} (ref >59)

## 2022-10-06 LAB — CBC WITH DIFFERENTIAL/PLATELET
Basophils Absolute: 0 10*3/uL (ref 0.0–0.2)
Basos: 1 %
EOS (ABSOLUTE): 0.1 10*3/uL (ref 0.0–0.4)
Eos: 1 %
Hematocrit: 46.8 % (ref 37.5–51.0)
Hemoglobin: 15.7 g/dL (ref 13.0–17.7)
Immature Grans (Abs): 0 10*3/uL (ref 0.0–0.1)
Immature Granulocytes: 0 %
Lymphocytes Absolute: 1.9 10*3/uL (ref 0.7–3.1)
Lymphs: 27 %
MCH: 30.5 pg (ref 26.6–33.0)
MCHC: 33.5 g/dL (ref 31.5–35.7)
MCV: 91 fL (ref 79–97)
Monocytes Absolute: 0.7 10*3/uL (ref 0.1–0.9)
Monocytes: 10 %
Neutrophils Absolute: 4.1 10*3/uL (ref 1.4–7.0)
Neutrophils: 61 %
Platelets: 290 10*3/uL (ref 150–450)
RBC: 5.14 x10E6/uL (ref 4.14–5.80)
RDW: 12.5 % (ref 11.6–15.4)
WBC: 6.9 10*3/uL (ref 3.4–10.8)

## 2022-10-06 LAB — TSH: TSH: 0.235 u[IU]/mL — ABNORMAL LOW (ref 0.450–4.500)

## 2022-10-06 LAB — HEMOGLOBIN A1C
Est. average glucose Bld gHb Est-mCnc: 120 mg/dL
Hgb A1c MFr Bld: 5.8 % — ABNORMAL HIGH (ref 4.8–5.6)

## 2022-10-09 LAB — SPECIMEN STATUS REPORT

## 2022-10-09 LAB — T4: T4, Total: 6 ug/dL (ref 4.5–12.0)

## 2022-10-15 ENCOUNTER — Encounter: Payer: Self-pay | Admitting: Adult Health

## 2022-10-15 ENCOUNTER — Ambulatory Visit (INDEPENDENT_AMBULATORY_CARE_PROVIDER_SITE_OTHER): Payer: BC Managed Care – PPO | Admitting: Adult Health

## 2022-10-15 VITALS — BP 130/70 | HR 65 | Temp 97.9°F | Ht 67.0 in | Wt 172.8 lb

## 2022-10-15 DIAGNOSIS — G4733 Obstructive sleep apnea (adult) (pediatric): Secondary | ICD-10-CM | POA: Diagnosis not present

## 2022-10-15 NOTE — Progress Notes (Signed)
@Patient  ID: Henry Duncan, male    DOB: 1990-06-20, 32 y.o.   MRN: [id number]  Chief Complaint  Patient presents with   Follow-up    Referring provider: Reubin Milan, MD  HPI: 32 year old male seen for sleep consult August 07, 2022 to establish for sleep apnea Diagnosed with sleep apnea around 2018 started on CPAP-was CPAP intolerant  Medical history significant for severe hypertension, coronary artery disease, thoracic aortic aneurysm status post dissection status post Bentall procedure, status post aortic valve replacement Postop developed a pericardial effusion required pericardial window in 2020 Followed by dermatology for psoriasis   TEST/EVENTS :  Home sleep study September 11, 2022 showed mild sleep apnea with AHI of 5.3/hour and SpO2 low at 88%  2D echo April 23, 2022 showed EF at 60 to 65%, normal pulmonary artery systolic pressure, aortic valve repair no stenosis noted  10/15/2022 Follow up : OSA  Patient presents for a 75-month follow-up.  Patient was seen last visit for sleep consult to establish for sleep apnea.  Was diagnosed with sleep apnea in 2018 started on CPAP but had difficulty tolerating.  Last visit was having ongoing episodes of snoring, daytime sleepiness and witnessed apneic events.  Patient was set up for home sleep study that was done on September 11, 2022 that showed mild sleep apnea with AHI 5.3/hour and SpO2 low at 88%.  We discussed his sleep study results in detail went over treatment options including weight loss, oral appliance and CPAP therapy.  Patient would like to proceed with oral appliance .   Past Surgical History:  Procedure Laterality Date   BENTALL PROCEDURE N/A 09/19/2018   Procedure: BENTALL PROCEDURE WITH 23 ST. JUDE GRAFT CONDUIT.;  Surgeon: Alleen Borne, MD;  Location: [facility] OR;  Service: Open Heart Surgery;  Laterality: N/A;   CARDIAC SURGERY     CLEFT LIP REPAIR     CORONARY ANGIOGRAPHY Left 06/13/2021   Procedure: CORONARY  ANGIOGRAPHY (CATH LAB);  Surgeon: Iran Ouch, MD;  Location: [facility] INVASIVE CV LAB;  Service: Cardiovascular;  Laterality: Left;   CORONARY PRESSURE/FFR WITH 3D MAPPING N/A 06/13/2021   Procedure: Coronary Pressure Wire, IFR;  Surgeon: Iran Ouch, MD;  Location: [facility] INVASIVE CV LAB;  Service: Cardiovascular;  Laterality: N/A;   ESOPHAGOGASTRODUODENOSCOPY (EGD) WITH PROPOFOL N/A 05/26/2022   Procedure: ESOPHAGOGASTRODUODENOSCOPY (EGD) WITH PROPOFOL;  Surgeon: Toney Reil, MD;  Location: [facility] ENDOSCOPY;  Service: Gastroenterology;  Laterality: N/A;   SUBXYPHOID PERICARDIAL WINDOW N/A 09/26/2018   Procedure: SUBXYPHOID PERICARDIAL WINDOW;  Surgeon: Alleen Borne, MD;  Location: [facility] OR;  Service: Thoracic;  Laterality: N/A;   TEE WITHOUT CARDIOVERSION N/A 09/19/2018   Procedure: TRANSESOPHAGEAL ECHOCARDIOGRAM (TEE);  Surgeon: Alleen Borne, MD;  Location: [facility] OR;  Service: Open Heart Surgery;  Laterality: N/A;   WISDOM TOOTH EXTRACTION      No Known Allergies  Immunization History  Administered Date(s) Administered   Hepatitis B, ADULT 09/02/2015, 11/06/2015, 03/03/2016   Tdap 09/02/2011, 09/30/2021    Past Medical History:  Diagnosis Date   Allergy    Anxiety    Aortic aneurysm (HCC) 09/16/2018   a. 08/2018 Asc Ao/root aneuryms w/ dissection-->s/p Bentall w/ 26mm Hemashield graft, 23mm SJM AVR, and reimplantation of cor arteries; b. 04/2021 CTA Chest: stable post-op changes.   CAD (coronary artery disease)    a. 08/2018 Cor CTA: Ca2+ 99th%'ile (476). Calcified plaque in mLAD. Nl LM, LCX, RCA. S/p reimplantation of coronary arteries in setting of Bentall;  b. 05/2021 Cor CTA: LAD >76m (nl FFRct); c. 05/2021 Cath: LM nl, LAD 40p, 59m, LCX/OM1 min irregs, RCA nl->Med rx.   H/O mechanical aortic valve replacement    a. 08/2018 in setting of bicuspid AoV and Asc Ao dissection-->18mm SJM mech AVR; b. 03/2020 Echo: EF 55-60%. No rwma. Mild LVH. Nl RV fxn. Nl functioning AVR.    Hyperlipidemia    Hypertension    Pericardial effusion    a. 08/2018 post-op -->s/p window.    Tobacco History: Social History   Tobacco Use  Smoking Status Never  Smokeless Tobacco Never   Counseling given: Not Answered   Outpatient Medications Prior to Visit  Medication Sig Dispense Refill   ALPRAZolam (XANAX) 0.5 MG tablet Take 1 tablet (0.5 mg total) by mouth 2 (two) times daily as needed for anxiety. 15 tablet 5   atorvastatin (LIPITOR) 80 MG tablet Take 1 tablet (80 mg total) by mouth daily. 90 tablet 3   Bempedoic Acid-Ezetimibe (NEXLIZET) 180-10 MG TABS Take 1 tablet by mouth daily. 30 tablet 3   busPIRone (BUSPAR) 5 MG tablet TAKE ONE TABLET BY MOUTH TWICE A DAY 180 tablet 3   cetirizine (ZYRTEC) 10 MG tablet Take 10 mg by mouth daily as needed for allergies.     clobetasol cream (TEMOVATE) 0.05 % Apply 1 application topically 2 (two) times daily. To external ear canals 30 g 0   fenofibrate (TRICOR) 145 MG tablet Take 1 tablet (145 mg total) by mouth daily. 30 tablet 3   isosorbide mononitrate (IMDUR) 30 MG 24 hr tablet Take 1 tablet (30 mg total) by mouth in the morning and at bedtime. 180 tablet 0   methocarbamol (ROBAXIN) 500 MG tablet Take 1 tablet (500 mg total) by mouth at bedtime. 30 tablet 0   metoprolol succinate (TOPROL-XL) 50 MG 24 hr tablet Take 1 1/2 tabs (75mg ) by mouth twice a day 90 tablet 0   omeprazole (PRILOSEC) 40 MG capsule TAKE ONE CAPSULE BY MOUTH TWICE A DAY BEFORE A MEAL 60 capsule 2   OTEZLA 30 MG TABS Take 1 tablet by mouth 2 (two) times daily.     warfarin (COUMADIN) 5 MG tablet Take 1 tablet to 1.5 tablets by mouth daily or as directed by the Anticoagulation Clinic. 135 tablet 1   ZORYVE 0.3 % CREA SMARTSIG:sparingly Topical Daily     No facility-administered medications prior to visit.     Review of Systems:   Constitutional:   No  weight loss, night sweats,  Fevers, chills, + fatigue, or  lassitude.  HEENT:   No headaches,  Difficulty  swallowing,  Tooth/dental problems, or  Sore throat,                No sneezing, itching, ear ache, nasal congestion, post nasal drip,   CV:  No chest pain,  Orthopnea, PND, swelling in lower extremities, anasarca, dizziness, palpitations, syncope.   GI  No heartburn, indigestion, abdominal pain, nausea, vomiting, diarrhea, change in bowel habits, loss of appetite, bloody stools.   Resp: No shortness of breath with exertion or at rest.  No excess mucus, no productive cough,  No non-productive cough,  No coughing up of blood.  No change in color of mucus.  No wheezing.  No chest wall deformity  Skin: no rash or lesions.  GU: no dysuria, change in color of urine, no urgency or frequency.  No flank pain, no hematuria   MS:  No joint pain or swelling.  No decreased  range of motion.  No back pain.    Physical Exam  BP 130/70 (BP Location: Left Arm, Cuff Size: Normal)   Pulse 65   Temp 97.9 F (36.6 C)   Ht 5\' 7"  (1.702 m)   Wt 172 lb 12.8 oz (78.4 kg)   SpO2 96%   BMI 27.06 kg/m   GEN: A/Ox3; pleasant , NAD, well nourished    HEENT:  [place]/AT,  NOSE-clear, THROAT-clear, no lesions, no postnasal drip or exudate noted. Class 3 MP airway , prev cleft lip repair , full beard   NECK:  Supple w/ fair ROM; no JVD; normal carotid impulses w/o bruits; no thyromegaly or nodules palpated; no lymphadenopathy.    RESP  Clear  P & A; w/o, wheezes/ rales/ or rhonchi. no accessory muscle use, no dullness to percussion  CARD:  RRR, no m/r/g, no peripheral edema, pulses intact, no cyanosis or clubbing.  GI:   Soft & nt; nml bowel sounds; no organomegaly or masses detected.   Musco: Warm bil, no deformities or joint swelling noted.   Neuro: alert, no focal deficits noted.    Skin: Warm, no lesions or rashes      BMET   BNP No results found for: "BNP"  ProBNP No results found for: "PROBNP"  Imaging: No results found.        No data to display          No results found for:  "NITRICOXIDE"      Assessment & Plan:   OSA (obstructive sleep apnea) Mild obstructive sleep apnea.  We went over her sleep study results in detail.  Patient education was provided on sleep apnea.  Patient has tried CPAP in the past and felt it was uncomfortable.  Would like to be referred for oral appliance.  Will refer to Dr. Myrtis Ser orthodontics in [facility]  Patient Instructions  Refer to orthodontics Dr. Myrtis Ser for oral appliance [phone])  Work on healthy weight loss  Do not drive if sleepy  Healthy sleep regimen  Follow up in 6 months and As needed         Rubye Oaks, NP 10/15/2022

## 2022-10-15 NOTE — Assessment & Plan Note (Signed)
Mild obstructive sleep apnea.  We went over her sleep study results in detail.  Patient education was provided on sleep apnea.  Patient has tried CPAP in the past and felt it was uncomfortable.  Would like to be referred for oral appliance.  Will refer to Dr. Myrtis Ser orthodontics in [facility]  Patient Instructions  Refer to orthodontics Dr. Myrtis Ser for oral appliance [phone])  Work on healthy weight loss  Do not drive if sleepy  Healthy sleep regimen  Follow up in 6 months and As needed

## 2022-10-15 NOTE — Patient Instructions (Addendum)
Refer to orthodontics Dr. Myrtis Ser for oral appliance [phone])  Work on healthy weight loss  Do not drive if sleepy  Healthy sleep regimen  Follow up in 6 months and As needed

## 2022-10-15 NOTE — Progress Notes (Signed)
Reviewed and agree with assessment/plan.   Coralyn Helling, MD [facility] 10/15/2022, 4:20 PM Pager:  [phone]

## 2022-10-21 ENCOUNTER — Ambulatory Visit: Payer: BC Managed Care – PPO | Attending: Cardiology

## 2022-10-21 DIAGNOSIS — Z5181 Encounter for therapeutic drug level monitoring: Secondary | ICD-10-CM | POA: Diagnosis not present

## 2022-10-21 DIAGNOSIS — Z952 Presence of prosthetic heart valve: Secondary | ICD-10-CM

## 2022-10-21 LAB — POCT INR: INR: 3.6 — AB (ref 2.0–3.0)

## 2022-10-21 NOTE — Patient Instructions (Signed)
HOLD TONIGHT ONLY then Continue warfarin 1.5 tablets daily except 1 tablet Mondays and Fridays. Recheck INR in 3 weeks

## 2022-11-05 ENCOUNTER — Other Ambulatory Visit: Payer: Self-pay | Admitting: *Deleted

## 2022-11-05 DIAGNOSIS — I2510 Atherosclerotic heart disease of native coronary artery without angina pectoris: Secondary | ICD-10-CM

## 2022-11-05 MED ORDER — METOPROLOL SUCCINATE ER 50 MG PO TB24: ORAL_TABLET | ORAL | 0 refills | Status: DC

## 2022-11-05 MED ORDER — ISOSORBIDE MONONITRATE ER 30 MG PO TB24
30.0000 mg | ORAL_TABLET | Freq: Two times a day (BID) | ORAL | 0 refills | Status: DC
Start: 2022-11-05 — End: 2023-01-05

## 2022-11-11 ENCOUNTER — Ambulatory Visit: Payer: BC Managed Care – PPO

## 2022-11-23 ENCOUNTER — Emergency Department
Admission: EM | Admit: 2022-11-23 | Discharge: 2022-11-23 | Disposition: A | Discharge status: Home / Self Care | Payer: PRIVATE HEALTH INSURANCE | Attending: Emergency Medicine | Admitting: Emergency Medicine

## 2022-11-23 ENCOUNTER — Emergency Department: Payer: PRIVATE HEALTH INSURANCE

## 2022-11-23 ENCOUNTER — Other Ambulatory Visit: Payer: Self-pay

## 2022-11-23 DIAGNOSIS — Z7901 Long term (current) use of anticoagulants: Secondary | ICD-10-CM | POA: Insufficient documentation

## 2022-11-23 DIAGNOSIS — K529 Noninfective gastroenteritis and colitis, unspecified: Secondary | ICD-10-CM | POA: Insufficient documentation

## 2022-11-23 DIAGNOSIS — R1030 Lower abdominal pain, unspecified: Secondary | ICD-10-CM | POA: Diagnosis present

## 2022-11-23 LAB — COMPREHENSIVE METABOLIC PANEL
ALT: 61 U/L — ABNORMAL HIGH (ref 0–44)
AST: 64 U/L — ABNORMAL HIGH (ref 15–41)
Albumin: 4 g/dL (ref 3.5–5.0)
Alkaline Phosphatase: 44 U/L (ref 38–126)
Anion gap: 9 (ref 5–15)
BUN: 14 mg/dL (ref 6–20)
CO2: 23 mmol/L (ref 22–32)
Calcium: 9 mg/dL (ref 8.9–10.3)
Chloride: 104 mmol/L (ref 98–111)
Creatinine, Ser: 0.71 mg/dL (ref 0.61–1.24)
GFR, Estimated: >60 mL/min (ref >60)
Glucose, Bld: 120 mg/dL — ABNORMAL HIGH (ref 70–99)
Potassium: 3.9 mmol/L (ref 3.5–5.1)
Sodium: 136 mmol/L (ref 135–145)
Total Bilirubin: 0.8 mg/dL (ref 0.3–1.2)
Total Protein: 6.5 g/dL (ref 6.5–8.1)

## 2022-11-23 LAB — CBC
HCT: 43.7 % (ref 39.0–52.0)
Hemoglobin: 14.5 g/dL (ref 13.0–17.0)
MCH: 29.8 pg (ref 26.0–34.0)
MCHC: 33.2 g/dL (ref 30.0–36.0)
MCV: 89.9 fL (ref 80.0–100.0)
Platelets: 216 10*3/uL (ref 150–400)
RBC: 4.86 MIL/uL (ref 4.22–5.81)
RDW: 12.5 % (ref 11.5–15.5)
WBC: 7.7 10*3/uL (ref 4.0–10.5)
nRBC: 0 % (ref 0.0–0.2)

## 2022-11-23 LAB — PROTIME-INR
INR: 2.4 — ABNORMAL HIGH (ref 0.8–1.2)
Prothrombin Time: 26.3 seconds — ABNORMAL HIGH (ref 11.4–15.2)

## 2022-11-23 LAB — LIPASE, BLOOD: Lipase: 41 U/L (ref 11–51)

## 2022-11-23 MED ORDER — OXYCODONE-ACETAMINOPHEN 5-325 MG PO TABS
1.0000 | ORAL_TABLET | Freq: Once | ORAL | Status: AC
  Administered 2022-11-23: 1 via ORAL
  Filled 2022-11-23: qty 1

## 2022-11-23 MED ORDER — ONDANSETRON 4 MG PO TBDP: 4.0000 mg | ORAL_TABLET | Freq: Three times a day (TID) | ORAL | 0 refills | Status: DC | PRN

## 2022-11-23 MED ORDER — ONDANSETRON HCL 4 MG/2ML IJ SOLN
4.0000 mg | Freq: Once | INTRAMUSCULAR | Status: AC
  Administered 2022-11-23: 4 mg via INTRAVENOUS
  Filled 2022-11-23: qty 2

## 2022-11-23 MED ORDER — HYDROCODONE-ACETAMINOPHEN 5-325 MG PO TABS: 1.0000 | ORAL_TABLET | Freq: Four times a day (QID) | ORAL | 0 refills | Status: DC | PRN

## 2022-11-23 MED ORDER — IOHEXOL 350 MG/ML SOLN
100.0000 mL | Freq: Once | INTRAVENOUS | Status: AC | PRN
  Administered 2022-11-23: 100 mL via INTRAVENOUS

## 2022-11-23 MED ORDER — AMOXICILLIN-POT CLAVULANATE 875-125 MG PO TABS: 1.0000 | ORAL_TABLET | Freq: Two times a day (BID) | ORAL | 0 refills | Status: AC

## 2022-11-23 MED ORDER — MORPHINE SULFATE (PF) 4 MG/ML IV SOLN
4.0000 mg | Freq: Once | INTRAVENOUS | Status: AC
  Administered 2022-11-23: 4 mg via INTRAVENOUS
  Filled 2022-11-23: qty 1

## 2022-11-23 NOTE — ED Provider Notes (Signed)
[facility] Provider Note    Event Date/Time   First MD Initiated Contact with Patient 11/23/22 [phone]     (approximate)   History   Abdominal Pain   HPI  Henry Duncan is a 32 y.o. male  with h/o bicuspid AV s/p repair here with abdominal pain. Pt reports that for the past 3 days or so he he has had aching, throbbing, intermittently severe lower abdominal pain. He has had associated nausea, but no vomiting. No diarrhea or change in BMs. He is on anticoagulation for his mechanical valve replacement and has been taking it as prescribed. No fevers, chills. No urinary sx. No h/o diverticulitis.       Physical Exam   Triage Vital Signs: ED Triage Vitals  Enc Vitals Group     BP 11/23/22 0628 94/62     Pulse Rate 11/23/22 0628 85     Resp 11/23/22 0628 16     Temp 11/23/22 0628 97.9 F (36.6 C)     Temp src --      SpO2 11/23/22 0628 98 %     Weight 11/23/22 0629 164 lb (74.4 kg)     Height 11/23/22 0629 5\' 7"  (1.702 m)     Head Circumference --      Peak Flow --      Pain Score 11/23/22 0629 0     Pain Loc --      Pain Edu? --      Excl. in GC? --     Most recent vital signs: Vitals:   11/23/22 0628  BP: 94/62  Pulse: 85  Resp: 16  Temp: 97.9 F (36.6 C)  SpO2: 98%     General: Awake, no distress.  CV:  Good peripheral perfusion. RRR. Resp:  Normal work of breathing. No m/r/g. Abd:  No distention. Mild lower abdominal TTP. No rebound or guarding. Other:  2+ radial and DP pulses are symmetric bilaterally   ED Results / Procedures / Treatments   Labs (all labs ordered are listed, but only abnormal results are displayed) Labs Reviewed  COMPREHENSIVE METABOLIC PANEL - Abnormal; Notable for the following components:      Result Value   Glucose, Bld 120 (*)    AST 64 (*)    ALT 61 (*)    All other components within normal limits  PROTIME-INR - Abnormal; Notable for the following components:   Prothrombin Time 26.3 (*)     INR 2.4 (*)    All other components within normal limits  LIPASE, BLOOD  CBC  URINALYSIS, ROUTINE W REFLEX MICROSCOPIC     EKG Normal sinus rhythm, VR 62. PR 136, QRS 96, QTc 420. No acute ST elevations. No ischemia or infarct.   RADIOLOGY No AAS, stable postsurgical changes, mild wall thickening of colon at hepatic flexure   I also independently reviewed and agree with radiologist interpretations.   PROCEDURES:  Critical Care performed: No   MEDICATIONS ORDERED IN ED: Medications  morphine (PF) 4 MG/ML injection 4 mg (4 mg Intravenous Given 11/23/22 0731)  ondansetron (ZOFRAN) injection 4 mg (4 mg Intravenous Given 11/23/22 0731)  iohexol (OMNIPAQUE) 350 MG/ML injection 100 mL (100 mLs Intravenous Contrast Given 11/23/22 0742)     IMPRESSION / MDM / ASSESSMENT AND PLAN / ED COURSE  I reviewed the triage vital signs and the nursing notes.  Differential diagnosis includes, but is not limited to, colitis, enteritis, mesenteric ischemia, mesenteric embolism in setting of mechanical valve, aortic dissection, AAA  Patient's presentation is most consistent with acute presentation with potential threat to life or bodily function.  The patient is on the cardiac monitor to evaluate for evidence of arrhythmia and/or significant heart rate changes  32 yo M with h/o bicuspid AV s/p repair here with lower abdominal pain. Exam as above. Pt is HDS (mild low BP is baseline due to his metoprolol use). CBC unremarkable. CMP with mild transaminitis - query FLD. Normal bili, no RUQ TTP noted. Lipase is normal. CT dissection obtained, reviewed, given his h/o AV replacement and for evaluation of possible mesenteric embolism or ischemia. CT scan shows mild colitis but is o/w urnemarkable. He is HDS. Will place on empiric ABX, d/c with outpt analgesia and good return precautions.    FINAL CLINICAL IMPRESSION(S) / ED DIAGNOSES   Final diagnoses:  Colitis     Rx / DC  Orders   ED Discharge Orders          Ordered    amoxicillin-clavulanate (AUGMENTIN) 875-125 MG tablet  2 times daily        11/23/22 0914    HYDROcodone-acetaminophen (NORCO/VICODIN) 5-325 MG tablet  Every 6 hours PRN        11/23/22 0914    ondansetron (ZOFRAN-ODT) 4 MG disintegrating tablet  Every 8 hours PRN        11/23/22 0914             Note:  This document was prepared using Dragon voice recognition software and may include unintentional dictation errors.   Shaune Pollack, MD 11/23/22 [phone]

## 2022-11-23 NOTE — ED Notes (Signed)
Pt A&O x4, no obvious distress noted, respirations regular/unlabored. Pt verbalizes understanding of discharge instructions. Pt able to ambulate from ED independently.   

## 2022-11-23 NOTE — ED Triage Notes (Signed)
Pt to ED for intermittent lower abd cramping since Friday. Denies n/v/d. Pt drinking coffee in triage. Denies pain at this time

## 2022-11-23 NOTE — ED Notes (Signed)
Patient transported to CT 

## 2022-11-25 ENCOUNTER — Ambulatory Visit: Payer: PRIVATE HEALTH INSURANCE | Attending: Cardiology

## 2022-11-25 DIAGNOSIS — Z5181 Encounter for therapeutic drug level monitoring: Secondary | ICD-10-CM | POA: Diagnosis not present

## 2022-11-25 DIAGNOSIS — Z952 Presence of prosthetic heart valve: Secondary | ICD-10-CM | POA: Diagnosis not present

## 2022-11-25 LAB — POCT INR: INR: 4 — AB (ref 2.0–3.0)

## 2022-11-25 NOTE — Patient Instructions (Signed)
HOLD TONIGHT ONLY then TAKE 1 TABLET THURSDAY AND SUNDAY THEN Continue warfarin 1.5 tablets daily except 1 tablet Mondays and Fridays. Recheck INR in 2 weeks

## 2022-11-26 ENCOUNTER — Ambulatory Visit
Admission: EM | Admit: 2022-11-26 | Discharge: 2022-11-26 | Disposition: A | Discharge status: Home / Self Care | Payer: PRIVATE HEALTH INSURANCE | Attending: Emergency Medicine | Admitting: Emergency Medicine

## 2022-11-26 DIAGNOSIS — K122 Cellulitis and abscess of mouth: Secondary | ICD-10-CM | POA: Diagnosis not present

## 2022-11-26 DIAGNOSIS — J029 Acute pharyngitis, unspecified: Secondary | ICD-10-CM

## 2022-11-26 MED ORDER — PREDNISONE 10 MG (21) PO TBPK: ORAL_TABLET | Freq: Every day | ORAL | 0 refills | Status: DC

## 2022-11-26 MED ORDER — DEXAMETHASONE SODIUM PHOSPHATE 10 MG/ML IJ SOLN
10.0000 mg | Freq: Once | INTRAMUSCULAR | Status: AC
  Administered 2022-11-26: 10 mg via INTRAMUSCULAR

## 2022-11-26 NOTE — ED Triage Notes (Signed)
Patient to Urgent Care with complaints of a sore throat that started this morning. Reports feeling like his uvula is more swollen than usual. Denies any known fevers. Painful swallowing.  Currently taking Augmentin for colitis (started Monday).

## 2022-11-26 NOTE — ED Provider Notes (Signed)
Henry Duncan    CSN: [id number] Arrival date & time: 11/26/22  0902      History   Chief Complaint Chief Complaint  Patient presents with   Sore Throat    Enlarged throat - Entered by patient    HPI Henry Duncan is a 32 y.o. male.  Patient presents with sore throat and swollen uvula since this morning.  He reports pain with swallowing but no occlusion of his airway.  No shortness of breath, fever, chills, rash, or other symptoms.  He is currently on Augmentin for colitis; he was seen at [facility] ED on 11/23/2022.  The history is provided by the patient and medical records.    Past Medical History:  Diagnosis Date   Allergy    Anxiety    Aortic aneurysm (HCC) 09/16/2018   a. 08/2018 Asc Ao/root aneuryms w/ dissection-->s/p Bentall w/ 26mm Hemashield graft, 23mm SJM AVR, and reimplantation of cor arteries; b. 04/2021 CTA Chest: stable post-op changes.   CAD (coronary artery disease)    a. 08/2018 Cor CTA: Ca2+ 99th%'ile (476). Calcified plaque in mLAD. Nl LM, LCX, RCA. S/p reimplantation of coronary arteries in setting of Bentall; b. 05/2021 Cor CTA: LAD >77m (nl FFRct); c. 05/2021 Cath: LM nl, LAD 40p, 38m, LCX/OM1 min irregs, RCA nl->Med rx.   H/O mechanical aortic valve replacement    a. 08/2018 in setting of bicuspid AoV and Asc Ao dissection-->75mm SJM mech AVR; b. 03/2020 Echo: EF 55-60%. No rwma. Mild LVH. Nl RV fxn. Nl functioning AVR.   Hyperlipidemia    Hypertension    Pericardial effusion    a. 08/2018 post-op -->s/p window.    Patient Active Problem List   Diagnosis Date Noted   Esophagitis, erosive 05/26/2022   Chronic GERD 05/26/2022   OSA (obstructive sleep apnea) 04/15/2022   Plaque psoriasis 04/14/2022   Mixed hyperlipidemia 09/09/2021   Onychodystrophy 06/25/2021   Neoplasm of uncertain behavior of skin 06/25/2021   Generalized anxiety disorder 06/25/2021   Abnormal cardiac CT angiography    Coronary artery disease involving native coronary  artery of native heart    Warfarin anticoagulation 03/15/2019   Encounter for therapeutic drug monitoring 09/29/2018   Aortic valve prosthesis present 09/29/2018   S/P ascending aortic aneurysm repair 09/19/2018   Allergic rhinitis 09/06/2018   Hepatic steatosis 10/30/2016   Insomnia 11/29/2015   Migraine 11/29/2015    Past Surgical History:  Procedure Laterality Date   BENTALL PROCEDURE N/A 09/19/2018   Procedure: BENTALL PROCEDURE WITH 23 ST. JUDE GRAFT CONDUIT.;  Surgeon: Alleen Borne, MD;  Location: [facility] OR;  Service: Open Heart Surgery;  Laterality: N/A;   CARDIAC SURGERY     CLEFT LIP REPAIR     CORONARY ANGIOGRAPHY Left 06/13/2021   Procedure: CORONARY ANGIOGRAPHY (CATH LAB);  Surgeon: Iran Ouch, MD;  Location: [facility] INVASIVE CV LAB;  Service: Cardiovascular;  Laterality: Left;   CORONARY PRESSURE/FFR WITH 3D MAPPING N/A 06/13/2021   Procedure: Coronary Pressure Wire, IFR;  Surgeon: Iran Ouch, MD;  Location: [facility] INVASIVE CV LAB;  Service: Cardiovascular;  Laterality: N/A;   ESOPHAGOGASTRODUODENOSCOPY (EGD) WITH PROPOFOL N/A 05/26/2022   Procedure: ESOPHAGOGASTRODUODENOSCOPY (EGD) WITH PROPOFOL;  Surgeon: Toney Reil, MD;  Location: [facility] ENDOSCOPY;  Service: Gastroenterology;  Laterality: N/A;   SUBXYPHOID PERICARDIAL WINDOW N/A 09/26/2018   Procedure: SUBXYPHOID PERICARDIAL WINDOW;  Surgeon: Alleen Borne, MD;  Location: [facility] OR;  Service: Thoracic;  Laterality: N/A;   TEE WITHOUT CARDIOVERSION N/A 09/19/2018  Procedure: TRANSESOPHAGEAL ECHOCARDIOGRAM (TEE);  Surgeon: Alleen Borne, MD;  Location: [facility] OR;  Service: Open Heart Surgery;  Laterality: N/A;   WISDOM TOOTH EXTRACTION         Home Medications    Prior to Admission medications   Medication Sig Start Date End Date Taking? Authorizing Provider  predniSONE (STERAPRED UNI-PAK 21 TAB) 10 MG (21) TBPK tablet Take by mouth daily. As directed 11/27/22  Yes Mickie Bail, NP  ALPRAZolam Prudy Feeler) 0.5  MG tablet Take 1 tablet (0.5 mg total) by mouth 2 (two) times daily as needed for anxiety. 10/05/22   Reubin Milan, MD  amoxicillin-clavulanate (AUGMENTIN) 875-125 MG tablet Take 1 tablet by mouth 2 (two) times daily for 7 days. 11/23/22 11/30/22  Shaune Pollack, MD  atorvastatin (LIPITOR) 80 MG tablet Take 1 tablet (80 mg total) by mouth daily. 05/20/22 05/15/23  Debbe Odea, MD  Bempedoic Acid-Ezetimibe (NEXLIZET) 180-10 MG TABS Take 1 tablet by mouth daily. 09/21/22   Debbe Odea, MD  busPIRone (BUSPAR) 5 MG tablet TAKE ONE TABLET BY MOUTH TWICE A DAY 04/06/22   Reubin Milan, MD  cetirizine (ZYRTEC) 10 MG tablet Take 10 mg by mouth daily as needed for allergies.    [provider]  clobetasol cream (TEMOVATE) 0.05 % Apply 1 application topically 2 (two) times daily. To external ear canals 09/03/20   Reubin Milan, MD  fenofibrate (TRICOR) 145 MG tablet Take 1 tablet (145 mg total) by mouth daily. 09/16/22   Debbe Odea, MD  HYDROcodone-acetaminophen (NORCO/VICODIN) 5-325 MG tablet Take 1-2 tablets by mouth every 6 (six) hours as needed for moderate pain or severe pain (no more than 6 tabs daily). 11/23/22 11/23/23  Shaune Pollack, MD  isosorbide mononitrate (IMDUR) 30 MG 24 hr tablet Take 1 tablet (30 mg total) by mouth in the morning and at bedtime. 11/05/22 02/03/23  Debbe Odea, MD  methocarbamol (ROBAXIN) 500 MG tablet Take 1 tablet (500 mg total) by mouth at bedtime. 02/25/22   Reubin Milan, MD  metoprolol succinate (TOPROL-XL) 50 MG 24 hr tablet Take 1 1/2 tabs (75mg ) by mouth twice a day 11/05/22   Debbe Odea, MD  omeprazole (PRILOSEC) 40 MG capsule TAKE ONE CAPSULE BY MOUTH TWICE A DAY BEFORE A MEAL 08/20/22   Vanga, Loel Dubonnet, MD  ondansetron (ZOFRAN-ODT) 4 MG disintegrating tablet Take 1 tablet (4 mg total) by mouth every 8 (eight) hours as needed. 11/23/22   Shaune Pollack, MD  OTEZLA 30 MG TABS Take 1 tablet by mouth 2 (two) times daily.  04/28/22   [provider]  warfarin (COUMADIN) 5 MG tablet Take 1 tablet to 1.5 tablets by mouth daily or as directed by the Anticoagulation Clinic. 01/12/22   Debbe Odea, MD  ZORYVE 0.3 % CREA SMARTSIG:sparingly Topical Daily 01/08/22   [provider]    Family History Family History  Problem Relation Age of Onset   Heart attack Mother    Anxiety disorder Father    Diabetes Father    Depression Brother    Anxiety disorder Brother     Social History Social History   Tobacco Use   Smoking status: Never   Smokeless tobacco: Never  Vaping Use   Vaping Use: Former  Substance Use Topics   Alcohol use: Yes   Drug use: Never     Allergies   Patient has no known allergies.   Review of Systems Review of Systems  Constitutional:  Negative for chills and fever.  HENT:  Positive for sore throat. Negative for ear pain, trouble swallowing and voice change.   Respiratory:  Negative for cough and shortness of breath.   Cardiovascular:  Negative for chest pain and palpitations.  Skin:  Negative for color change and rash.  All other systems reviewed and are negative.    Physical Exam Triage Vital Signs ED Triage Vitals [11/26/22 0917]  Enc Vitals Group     BP      Pulse Rate 88     Resp 18     Temp 98.3 F (36.8 C)     Temp src      SpO2 96 %     Weight      Height      Head Circumference      Peak Flow      Pain Score      Pain Loc      Pain Edu?      Excl. in GC?    No data found.  Updated Vital Signs BP 98/66   Pulse 88   Temp 98.3 F (36.8 C)   Resp 18   SpO2 96%   Visual Acuity Right Eye Distance:   Left Eye Distance:   Bilateral Distance:    Right Eye Near:   Left Eye Near:    Bilateral Near:     Physical Exam Vitals and nursing note reviewed.  Constitutional:      General: He is not in acute distress.    Appearance: Normal appearance. He is well-developed.  HENT:     Mouth/Throat:     Mouth: Mucous membranes are  moist.     Pharynx: Posterior oropharyngeal erythema and uvula swelling present.  Cardiovascular:     Rate and Rhythm: Normal rate and regular rhythm.     Heart sounds: Normal heart sounds.  Pulmonary:     Effort: Pulmonary effort is normal. No respiratory distress.     Breath sounds: Normal breath sounds.  Musculoskeletal:     Cervical back: Neck supple.  Skin:    General: Skin is warm and dry.  Neurological:     Mental Status: He is alert.  Psychiatric:        Mood and Affect: Mood normal.        Behavior: Behavior normal.      UC Treatments / Results  Labs (all labs ordered are listed, but only abnormal results are displayed) Labs Reviewed  POCT RAPID STREP A (OFFICE)    EKG   Radiology No results found.  Procedures Procedures (including critical care time)  Medications Ordered in UC Medications  dexamethasone (DECADRON) injection 10 mg (10 mg Intramuscular Given 11/26/22 0943)    Initial Impression / Assessment and Plan / UC Course  I have reviewed the triage vital signs and the nursing notes.  Pertinent labs & imaging results that were available during my care of the patient were reviewed by me and considered in my medical decision making (see chart for details).   Uvulitis, acute pharyngitis.  Patient is already on Augmentin which was prescribed at [facility] ED on 11/23/2022 for colitis.  He woke up this morning with a sore throat and swollen uvula.  No difficulty swallowing or breathing.  Afebrile and vital signs are stable.  Dexamethasone injection given here and starting prednisone taper tomorrow.  911 and ED precautions discussed with patient.  Education provided on uvulitis and pharyngitis.  Instructed patient to follow-up with his PCP tomorrow.  Instructed him to continue the  Augmentin as prescribed.  He agrees to plan of care.   Final Clinical Impressions(s) / UC Diagnoses   Final diagnoses:  Uvulitis  Acute pharyngitis, unspecified etiology      Discharge Instructions      You were given an injection of a steroid called dexamethasone.  Start the prednisone taper tomorrow as directed.    Follow up with your primary care provider tomorrow.   Call 911 and go to the emergency department if you have difficulty swallowing or breathing.        ED Prescriptions     Medication Sig Dispense Auth. Provider   predniSONE (STERAPRED UNI-PAK 21 TAB) 10 MG (21) TBPK tablet Take by mouth daily. As directed 21 tablet Mickie Bail, NP      PDMP not reviewed this encounter.   Mickie Bail, NP 11/26/22 [phone]

## 2022-11-26 NOTE — Discharge Instructions (Addendum)
You were given an injection of a steroid called dexamethasone.  Start the prednisone taper tomorrow as directed.    Follow up with your primary care provider tomorrow.   Call 911 and go to the emergency department if you have difficulty swallowing or breathing.

## 2022-11-30 ENCOUNTER — Telehealth: Payer: Self-pay

## 2022-11-30 ENCOUNTER — Ambulatory Visit: Payer: PRIVATE HEALTH INSURANCE | Admitting: Internal Medicine

## 2022-11-30 NOTE — Telephone Encounter (Signed)
Dr Judithann Graves asked me to contact patient to let him know if he is feeling better from his Urgent Care appointment then he does not need to come in for appointment today. Left VM informing pt of this.  - Henry Duncan

## 2022-12-03 ENCOUNTER — Encounter: Payer: Self-pay | Admitting: Internal Medicine

## 2022-12-03 NOTE — Telephone Encounter (Signed)
Please review. Appointment?  KP

## 2022-12-07 ENCOUNTER — Other Ambulatory Visit: Payer: Self-pay

## 2022-12-07 ENCOUNTER — Ambulatory Visit: Payer: PRIVATE HEALTH INSURANCE | Admitting: Internal Medicine

## 2022-12-07 MED ORDER — METOPROLOL SUCCINATE ER 50 MG PO TB24: ORAL_TABLET | ORAL | 0 refills | Status: DC

## 2022-12-07 NOTE — Telephone Encounter (Signed)
Requested Prescriptions   Signed Prescriptions Disp Refills   metoprolol succinate (TOPROL-XL) 50 MG 24 hr tablet 90 tablet 0    Sig: Take 1 1/2 tabs (75mg ) by mouth twice a day    Authorizing Provider: Debbe Odea    Ordering User: Guerry Minors

## 2022-12-08 ENCOUNTER — Encounter: Payer: Self-pay | Admitting: Internal Medicine

## 2022-12-09 ENCOUNTER — Ambulatory Visit: Payer: PRIVATE HEALTH INSURANCE | Attending: Cardiology

## 2022-12-09 DIAGNOSIS — Z952 Presence of prosthetic heart valve: Secondary | ICD-10-CM

## 2022-12-09 DIAGNOSIS — Z5181 Encounter for therapeutic drug level monitoring: Secondary | ICD-10-CM | POA: Diagnosis not present

## 2022-12-09 LAB — POCT INR: INR: 5.3 — AB (ref 2.0–3.0)

## 2022-12-09 NOTE — Patient Instructions (Signed)
HOLD TONIGHT and THURSDAY ONLY THEN Continue warfarin 1.5 tablets daily except 1 tablet Mondays and Fridays. Recheck INR in 2 weeks  [phone]

## 2022-12-12 ENCOUNTER — Other Ambulatory Visit: Payer: Self-pay | Admitting: Gastroenterology

## 2022-12-14 MED ORDER — OMEPRAZOLE 40 MG PO CPDR: 40.0000 mg | DELAYED_RELEASE_CAPSULE | Freq: Every day | ORAL | 0 refills | Status: DC

## 2022-12-14 NOTE — Telephone Encounter (Signed)
Last EGD 05/26/2022 said to use Omeprazole for 3 months  Last office visit 05/13/2022 Last refill 08/20/2022 2 refills

## 2022-12-14 NOTE — Telephone Encounter (Signed)
Patient verbalized understanding and made appointment 02/10/2023 with Inetta Fermo

## 2022-12-14 NOTE — Addendum Note (Signed)
Addended by: Radene Knee L on: 12/14/2022 09:46 AM   Modules accepted: Orders

## 2022-12-23 ENCOUNTER — Ambulatory Visit: Payer: PRIVATE HEALTH INSURANCE | Attending: Cardiology

## 2022-12-23 DIAGNOSIS — Z952 Presence of prosthetic heart valve: Secondary | ICD-10-CM | POA: Diagnosis not present

## 2022-12-23 DIAGNOSIS — Z5181 Encounter for therapeutic drug level monitoring: Secondary | ICD-10-CM | POA: Diagnosis not present

## 2022-12-23 LAB — POCT INR: INR: 2.7 (ref 2.0–3.0)

## 2022-12-23 NOTE — Patient Instructions (Signed)
Continue warfarin 1.5 tablets daily except 1 tablet Mondays and Fridays. Recheck INR in 4 weeks  [phone]

## 2022-12-30 ENCOUNTER — Ambulatory Visit
Admission: RE | Admit: 2022-12-30 | Discharge: 2022-12-30 | Disposition: A | Discharge status: Home / Self Care | Payer: PRIVATE HEALTH INSURANCE | Source: Ambulatory Visit | Attending: Emergency Medicine | Admitting: Emergency Medicine

## 2022-12-30 VITALS — BP 112/78 | HR 87 | Temp 99.3°F | Resp 18

## 2022-12-30 DIAGNOSIS — R059 Cough, unspecified: Secondary | ICD-10-CM | POA: Diagnosis present

## 2022-12-30 DIAGNOSIS — Z7901 Long term (current) use of anticoagulants: Secondary | ICD-10-CM | POA: Diagnosis not present

## 2022-12-30 DIAGNOSIS — B349 Viral infection, unspecified: Secondary | ICD-10-CM | POA: Diagnosis not present

## 2022-12-30 DIAGNOSIS — Z1152 Encounter for screening for COVID-19: Secondary | ICD-10-CM | POA: Insufficient documentation

## 2022-12-30 LAB — POCT RAPID STREP A (OFFICE): Rapid Strep A Screen: NEGATIVE

## 2022-12-30 MED ORDER — AMOXICILLIN-POT CLAVULANATE 875-125 MG PO TABS: 1.0000 | ORAL_TABLET | Freq: Two times a day (BID) | ORAL | 0 refills | Status: DC

## 2022-12-30 MED ORDER — DICYCLOMINE HCL 20 MG PO TABS: 20.0000 mg | ORAL_TABLET | Freq: Two times a day (BID) | ORAL | 0 refills | Status: DC

## 2022-12-30 NOTE — Discharge Instructions (Addendum)
Your symptoms today are most likely being caused by a virus and should steadily improve in time it can take up to 7 to 10 days before you truly start to see a turnaround however things will get better  Rapid strep test is negative for bacteria  COVID test is pending up to 24 hours, you will be notified of positive test results only, if positive you will need to quarantine if having fever, if no fever you may continue activity wearing mask until symptoms have resolved  On exam there is redness,  patches and increased tonsil size that causes concern for additional bacteria that may be causing your symptoms therefore if your COVID testing is negative begin Augmentin to provide coverage for bacteria, take as directed  For stomach cramping you may use Bentyl every morning and every evening to help reduce symptoms, may take Tylenol in addition to this as needed    You can take Tylenol and/or Ibuprofen as needed for fever reduction and pain relief.   For cough: honey 1/2 to 1 teaspoon (you can dilute the honey in water or another fluid).  You can also use guaifenesin and dextromethorphan for cough. You can use a humidifier for chest congestion and cough.  If you don't have a humidifier, you can sit in the bathroom with the hot shower running.      For sore throat: try warm salt water gargles, cepacol lozenges, throat spray, warm tea or water with lemon/honey, popsicles or ice, or OTC cold relief medicine for throat discomfort.   For congestion: take a daily anti-histamine like Zyrtec, Claritin, and a oral decongestant, such as pseudoephedrine.  You can also use Flonase 1-2 sprays in each nostril daily.   It is important to stay hydrated: drink plenty of fluids (water, gatorade/powerade/pedialyte, juices, or teas) to keep your throat moisturized and help further relieve irritation/discomfort.

## 2022-12-30 NOTE — ED Triage Notes (Addendum)
Patient to Urgent Care with complaints of cough/ fever/ chills/ chest congestion. Max temp 100.3. Reports multiple episodes of vomiting yesterday and the day before. One episode today.  Reports his son is 28 months old and attends daycare. Sick with same symptoms.   Reports symptoms started two days ago. Poor oral intake for the last two days due to throat discomfort and painful swallowing.

## 2022-12-30 NOTE — ED Provider Notes (Signed)
Henry Duncan    CSN: [id number] Arrival date & time: 12/30/22  1341      History   Chief Complaint Chief Complaint  Patient presents with   Cough    Fever chills chest congestion - Entered by patient    HPI Henry Duncan is a 32 y.o. male.   Patient presents for evaluation of fever, chills, body aches, nasal congestion, cough, sore throat ,abdominal pain and vomiting beginning 2 days ago.  Fever peaking at 100.3.  Last occurrence of vomiting this morning.  Painful to swallow, decreased appetite, difficulty tolerating food but able to tolerate liquids.  Abdominal pain occurring intermittently, centralized described as a severe cramping.  cough is nonproductive and has begun to cause a chest soreness but denies shortness of breath and wheezing.  Known sick contact in household as 49-month-old son has similar symptoms, attends daycare.  Tempted use of Zofran, ginger ale and over-the-counter medication which has been somewhat helpful.  Denies respiratory history, non-smoker.  Currently taking Coumadin.    Past Medical History:  Diagnosis Date   Allergy    Anxiety    Aortic aneurysm (HCC) 09/16/2018   a. 08/2018 Asc Ao/root aneuryms w/ dissection-->s/p Bentall w/ 26mm Hemashield graft, 23mm SJM AVR, and reimplantation of cor arteries; b. 04/2021 CTA Chest: stable post-op changes.   CAD (coronary artery disease)    a. 08/2018 Cor CTA: Ca2+ 99th%'ile (476). Calcified plaque in mLAD. Nl LM, LCX, RCA. S/p reimplantation of coronary arteries in setting of Bentall; b. 05/2021 Cor CTA: LAD >64m (nl FFRct); c. 05/2021 Cath: LM nl, LAD 40p, 68m, LCX/OM1 min irregs, RCA nl->Med rx.   H/O mechanical aortic valve replacement    a. 08/2018 in setting of bicuspid AoV and Asc Ao dissection-->73mm SJM mech AVR; b. 03/2020 Echo: EF 55-60%. No rwma. Mild LVH. Nl RV fxn. Nl functioning AVR.   Hyperlipidemia    Hypertension    Pericardial effusion    a. 08/2018 post-op -->s/p window.     Patient Active Problem List   Diagnosis Date Noted   Esophagitis, erosive 05/26/2022   Chronic GERD 05/26/2022   OSA (obstructive sleep apnea) 04/15/2022   Plaque psoriasis 04/14/2022   Mixed hyperlipidemia 09/09/2021   Onychodystrophy 06/25/2021   Neoplasm of uncertain behavior of skin 06/25/2021   Generalized anxiety disorder 06/25/2021   Abnormal cardiac CT angiography    Coronary artery disease involving native coronary artery of native heart    Warfarin anticoagulation 03/15/2019   Encounter for therapeutic drug monitoring 09/29/2018   Aortic valve prosthesis present 09/29/2018   S/P ascending aortic aneurysm repair 09/19/2018   Allergic rhinitis 09/06/2018   Hepatic steatosis 10/30/2016   Insomnia 11/29/2015   Migraine 11/29/2015    Past Surgical History:  Procedure Laterality Date   BENTALL PROCEDURE N/A 09/19/2018   Procedure: BENTALL PROCEDURE WITH 23 ST. JUDE GRAFT CONDUIT.;  Surgeon: Alleen Borne, MD;  Location: [facility] OR;  Service: Open Heart Surgery;  Laterality: N/A;   CARDIAC SURGERY     CLEFT LIP REPAIR     CORONARY ANGIOGRAPHY Left 06/13/2021   Procedure: CORONARY ANGIOGRAPHY (CATH LAB);  Surgeon: Iran Ouch, MD;  Location: [facility] INVASIVE CV LAB;  Service: Cardiovascular;  Laterality: Left;   CORONARY PRESSURE/FFR WITH 3D MAPPING N/A 06/13/2021   Procedure: Coronary Pressure Wire, IFR;  Surgeon: Iran Ouch, MD;  Location: [facility] INVASIVE CV LAB;  Service: Cardiovascular;  Laterality: N/A;   ESOPHAGOGASTRODUODENOSCOPY (EGD) WITH PROPOFOL N/A 05/26/2022   Procedure:  ESOPHAGOGASTRODUODENOSCOPY (EGD) WITH PROPOFOL;  Surgeon: Toney Reil, MD;  Location[facility];  Service: Gastroenterology;  Laterality: N/A;   SUBXYPHOID PERICARDIAL WINDOW N/A 09/26/2018   Procedure: SUBXYPHOID PERICARDIAL WINDOW;  Surgeon: Alleen Borne, MD;  Location: [facility] OR;  Service: Thoracic;  Laterality: N/A;   TEE WITHOUT CARDIOVERSION N/A 09/19/2018   Procedure:  TRANSESOPHAGEAL ECHOCARDIOGRAM (TEE);  Surgeon: Alleen Borne, MD;  Location: [facility] OR;  Service: Open Heart Surgery;  Laterality: N/A;   WISDOM TOOTH EXTRACTION         Home Medications    Prior to Admission medications   Medication Sig Start Date End Date Taking? Authorizing Provider  amoxicillin-clavulanate (AUGMENTIN) 875-125 MG tablet Take 1 tablet by mouth every 12 (twelve) hours. 12/30/22  Yes ,  R, NP  dicyclomine (BENTYL) 20 MG tablet Take 1 tablet (20 mg total) by mouth 2 (two) times daily. 12/30/22  Yes , Elita Boone, NP  ALPRAZolam (XANAX) 0.5 MG tablet Take 1 tablet (0.5 mg total) by mouth 2 (two) times daily as needed for anxiety. 10/05/22   Reubin Milan, MD  atorvastatin (LIPITOR) 80 MG tablet Take 1 tablet (80 mg total) by mouth daily. 05/20/22 05/15/23  Debbe Odea, MD  Bempedoic Acid-Ezetimibe (NEXLIZET) 180-10 MG TABS Take 1 tablet by mouth daily. 09/21/22   Debbe Odea, MD  busPIRone (BUSPAR) 5 MG tablet TAKE ONE TABLET BY MOUTH TWICE A DAY 04/06/22   Reubin Milan, MD  cetirizine (ZYRTEC) 10 MG tablet Take 10 mg by mouth daily as needed for allergies.    [provider]  clobetasol cream (TEMOVATE) 0.05 % Apply 1 application topically 2 (two) times daily. To external ear canals 09/03/20   Reubin Milan, MD  fenofibrate (TRICOR) 145 MG tablet Take 1 tablet (145 mg total) by mouth daily. 09/16/22   Debbe Odea, MD  HYDROcodone-acetaminophen (NORCO/VICODIN) 5-325 MG tablet Take 1-2 tablets by mouth every 6 (six) hours as needed for moderate pain or severe pain (no more than 6 tabs daily). Patient not taking: Reported on 12/30/2022 11/23/22 11/23/23  Shaune Pollack, MD  isosorbide mononitrate (IMDUR) 30 MG 24 hr tablet Take 1 tablet (30 mg total) by mouth in the morning and at bedtime. 11/05/22 02/03/23  Debbe Odea, MD  methocarbamol (ROBAXIN) 500 MG tablet Take 1 tablet (500 mg total) by mouth at bedtime. 02/25/22   Reubin Milan, MD  metoprolol succinate (TOPROL-XL) 50 MG 24 hr tablet Take 1 1/2 tabs (75mg ) by mouth twice a day 12/07/22   Debbe Odea, MD  omeprazole (PRILOSEC) 40 MG capsule Take 1 capsule (40 mg total) by mouth daily before breakfast. 12/14/22   Vanga, Loel Dubonnet, MD  ondansetron (ZOFRAN-ODT) 4 MG disintegrating tablet Take 1 tablet (4 mg total) by mouth every 8 (eight) hours as needed. 11/23/22   Shaune Pollack, MD  OTEZLA 30 MG TABS Take 1 tablet by mouth 2 (two) times daily. 04/28/22   [provider]  predniSONE (STERAPRED UNI-PAK 21 TAB) 10 MG (21) TBPK tablet Take by mouth daily. As directed Patient not taking: Reported on 12/30/2022 11/27/22   Mickie Bail, NP  warfarin (COUMADIN) 5 MG tablet Take 1 tablet to 1.5 tablets by mouth daily or as directed by the Anticoagulation Clinic. 01/12/22   Debbe Odea, MD  ZORYVE 0.3 % CREA SMARTSIG:sparingly Topical Daily 01/08/22   [provider]    Family History Family History  Problem Relation Age of Onset   Heart attack Mother  Anxiety disorder Father    Diabetes Father    Depression Brother    Anxiety disorder Brother     Social History Social History   Tobacco Use   Smoking status: Never   Smokeless tobacco: Never  Vaping Use   Vaping status: Former  Substance Use Topics   Alcohol use: Yes   Drug use: Never     Allergies   Patient has no known allergies.   Review of Systems Review of Systems  Respiratory:  Positive for cough.      Physical Exam Triage Vital Signs ED Triage Vitals  Encounter Vitals Group     BP 12/30/22 1409 112/78     Systolic BP Percentile --      Diastolic BP Percentile --      Pulse Rate 12/30/22 1409 87     Resp 12/30/22 1409 18     Temp 12/30/22 1409 99.3 F (37.4 C)     Temp src --      SpO2 12/30/22 1409 98 %     Weight --      Height --      Head Circumference --      Peak Flow --      Pain Score 12/30/22 1405 8     Pain Loc --      Pain Education --       Exclude from Growth Chart --    No data found.  Updated Vital Signs BP 112/78   Pulse 87   Temp 99.3 F (37.4 C)   Resp 18   SpO2 98%   Visual Acuity Right Eye Distance:   Left Eye Distance:   Bilateral Distance:    Right Eye Near:   Left Eye Near:    Bilateral Near:     Physical Exam Constitutional:      Appearance: Normal appearance.  HENT:     Right Ear: Tympanic membrane, ear canal and external ear normal.     Left Ear: Tympanic membrane, ear canal and external ear normal.     Nose: Congestion and rhinorrhea present.     Mouth/Throat:     Pharynx: Posterior oropharyngeal erythema present.     Tonsils: Tonsillar exudate present. 2+ on the right. 2+ on the left.  Eyes:     Extraocular Movements: Extraocular movements intact.  Cardiovascular:     Rate and Rhythm: Normal rate and regular rhythm.     Pulses: Normal pulses.     Heart sounds: Normal heart sounds.  Pulmonary:     Effort: Pulmonary effort is normal.     Breath sounds: Normal breath sounds.  Abdominal:     General: Abdomen is flat. Bowel sounds are normal.     Palpations: Abdomen is soft.  Skin:    General: Skin is warm and dry.  Neurological:     Mental Status: He is alert and oriented to person, place, and time. Mental status is at baseline.      UC Treatments / Results  Labs (all labs ordered are listed, but only abnormal results are displayed) Labs Reviewed  SARS CORONAVIRUS 2 (TAT 6-24 HRS)  POCT RAPID STREP A (OFFICE)    EKG   Radiology No results found.  Procedures Procedures (including critical care time)  Medications Ordered in UC Medications - No data to display  Initial Impression / Assessment and Plan / UC Course  I have reviewed the triage vital signs and the nursing notes.  Pertinent labs & imaging results that  were available during my care of the patient were reviewed by me and considered in my medical decision making (see chart for details).   Viral  illness  Vital signs are able, patient is in no signs of distress nontoxic-appearing, rapid strep test negative however based on presentation of throat with significant erythema, tonsillar adenopathy and exudate possible additional bacteria causing symptoms, COVID test is pending, if antiviral compatible with Coumadin may use, Augmentin sent to pharmacy prophylactically that if COVID testing negative he will begin use, discussed this with patient, discussed quarantine if positive, at this time abdominal cramping most worrisome symptom, recent bout of colitis, prescribed dicyclomine for treatment, may use Tylenol additionally, may attempt additional over-the-counter medications with urgent care follow-up if symptoms persist or worsen, work note given Final Clinical Impressions(s) / UC Diagnoses   Final diagnoses:  Viral illness     Discharge Instructions      Your symptoms today are most likely being caused by a virus and should steadily improve in time it can take up to 7 to 10 days before you truly start to see a turnaround however things will get better  Rapid strep test is negative for bacteria  COVID test is pending up to 24 hours, you will be notified of positive test results only, if positive you will need to quarantine if having fever, if no fever you may continue activity wearing mask until symptoms have resolved  On exam there is redness,  patches and increased tonsil size that causes concern for additional bacteria that may be causing your symptoms therefore if your COVID testing is negative begin Augmentin to provide coverage for bacteria, take as directed  For stomach cramping you may use Bentyl every morning and every evening to help reduce symptoms, may take Tylenol in addition to this as needed    You can take Tylenol and/or Ibuprofen as needed for fever reduction and pain relief.   For cough: honey 1/2 to 1 teaspoon (you can dilute the honey in water or another fluid).   You can also use guaifenesin and dextromethorphan for cough. You can use a humidifier for chest congestion and cough.  If you don't have a humidifier, you can sit in the bathroom with the hot shower running.      For sore throat: try warm salt water gargles, cepacol lozenges, throat spray, warm tea or water with lemon/honey, popsicles or ice, or OTC cold relief medicine for throat discomfort.   For congestion: take a daily anti-histamine like Zyrtec, Claritin, and a oral decongestant, such as pseudoephedrine.  You can also use Flonase 1-2 sprays in each nostril daily.   It is important to stay hydrated: drink plenty of fluids (water, gatorade/powerade/pedialyte, juices, or teas) to keep your throat moisturized and help further relieve irritation/discomfort.    ED Prescriptions     Medication Sig Dispense Auth. Provider   amoxicillin-clavulanate (AUGMENTIN) 875-125 MG tablet Take 1 tablet by mouth every 12 (twelve) hours. 14 tablet ,  R, NP   dicyclomine (BENTYL) 20 MG tablet Take 1 tablet (20 mg total) by mouth 2 (two) times daily. 20 tablet Valinda Hoar, NP      PDMP not reviewed this encounter.   Valinda Hoar, [place] 12/30/22 [phone]

## 2022-12-31 LAB — SARS CORONAVIRUS 2 (TAT 6-24 HRS): SARS Coronavirus 2: NEGATIVE

## 2023-01-04 ENCOUNTER — Other Ambulatory Visit: Payer: Self-pay | Admitting: Gastroenterology

## 2023-01-05 ENCOUNTER — Other Ambulatory Visit: Payer: Self-pay | Admitting: *Deleted

## 2023-01-05 DIAGNOSIS — I2510 Atherosclerotic heart disease of native coronary artery without angina pectoris: Secondary | ICD-10-CM

## 2023-01-05 MED ORDER — ISOSORBIDE MONONITRATE ER 30 MG PO TB24
30.0000 mg | ORAL_TABLET | Freq: Two times a day (BID) | ORAL | 0 refills | Status: DC
Start: 2023-01-05 — End: 2023-04-05

## 2023-01-05 MED ORDER — METOPROLOL SUCCINATE ER 50 MG PO TB24: ORAL_TABLET | ORAL | 0 refills | Status: DC

## 2023-01-07 ENCOUNTER — Encounter: Payer: Self-pay | Admitting: Internal Medicine

## 2023-01-07 NOTE — Progress Notes (Signed)
Cardiology Office Note    Date:  01/11/2023   ID:  Henry Duncan, DOB 06-09-1990, MRN [id number]  PCP:  Reubin Milan, MD  Cardiologist:  Debbe Odea, MD  Electrophysiologist:  None   Chief Complaint: Follow-up  History of Present Illness:   Henry Duncan is a 32 y.o. male with history of nonobstructive CAD by LHC in 05/2021, bicuspid aortic valve, aortic aneurysm complicated by ascending thoracic aortic dissection status post Bentall procedure with 23 mm Saint Jude's mechanical aortic valve on warfarin complicated by pericardial effusion postoperatively status post pericardial window in 08/2018, HTN, and HLD who presents for follow-up of his CAD and aortic valve disease.  He was admitted in 08/2018 with chest pain.  CT revealed an ascending thoracic aortic aneurysm measuring 5.7 to 5.8 cm with localized partial dissection of the anterior middle ascending aorta.  Echo at that time showed an EF of 60 to 65%, no regional wall motion abnormalities, normal RV systolic function and ventricular cavity size, small pericardial effusion, poorly visualized aortic valve, and severe dilatation of the aortic root and ascending aorta measuring greater than 50 mm.  Coronary CT with morphology showed an ascending aorta/root aneurysm with dissection flap, suspected bicuspid aortic valve, and a calcium score of 467 which was the 99th percentile with multivessel coronary artery calcification.  CT FFR was not hemodynamically significant.  He subsequently underwent Bentall procedure in 08/2018.  Echo in 2021 showed an EF of 55 to 60% with normal functioning prosthetic aortic valve.  Coronary CTA in 05/2021 showed a calcium score of 958 with greater than 70% mid LAD stenosis, and less than 25% proximal LCx and mid RCA stenosis.  CT FFR showed no hemodynamically significant stenosis.  He underwent LHC on 06/13/2021 which showed moderate one-vessel CAD involving the proximal and mid LAD estimated at 40%  and 50% stenosis respectively that was not significant by FFR with an IFR of 0.95, and a normal-appearing mechanical aortic valve.  Most recent echo from 03/2022 showed an EF of 60 to 65%, no regional wall motion abnormalities, normal LV diastolic function parameters, normal RV systolic function and ventricular cavity size, normal RVSP, prosthetic aortic valve with normal function, and an estimated right atrial pressure of 3 mmHg.  He was most recently seen in the office in 08/2022 noting occasional episodes of chest pain that were not exertional.  He was seen in the ED on 11/23/2018 for with abdominal pain to be related to colitis.  CTA of the chest/abdomen/pelvis at that time showed no evidence of acute aortic syndrome, stable postsurgical changes reflecting aortic root repair and aortic valve prosthesis, unchanged age accelerated coronary artery calcification, and mild wall thickening and surrounding inflammatory fat stranding involving the hepatic flexure representative of possible mild infectious or inflammatory colitis.  He comes in doing well from a cardiac perspective and is without symptoms of angina or cardiac decompensation.  Since titrating Imdur to 30 mg twice daily, he has not had any further chest discomfort.  He does note some intermittent dizziness, particularly if he stands up quickly.  He also notes an intermittent randomly occurring sensation of bilateral upper extremity heaviness that has been present since his surgery, less frequent since the perioperative timeframe.  No upper extremity pain, erythema, or swelling.  No falls or symptoms concerning for bleeding.  Adherent and tolerating cardiac medications.   Labs independently reviewed: 12/23/2022 - INR 2.7 11/2022 - Hgb 14.5, PLT 216, potassium 3.9, BUN 14, serum creatinine 0.71, albumin  4.0, AST 64, ALT 61 09/2022 - TSH 0.235, total T4 normal, A1c 5.8 08/2022 - TC 123, TG 175, HDL 36, LDL 52  Past Medical History:  Diagnosis Date    Allergy    Anxiety    Aortic aneurysm (HCC) 09/16/2018   a. 08/2018 Asc Ao/root aneuryms w/ dissection-->s/p Bentall w/ 26mm Hemashield graft, 23mm SJM AVR, and reimplantation of cor arteries; b. 04/2021 CTA Chest: stable post-op changes.   CAD (coronary artery disease)    a. 08/2018 Cor CTA: Ca2+ 99th%'ile (476). Calcified plaque in mLAD. Nl LM, LCX, RCA. S/p reimplantation of coronary arteries in setting of Bentall; b. 05/2021 Cor CTA: LAD >60m (nl FFRct); c. 05/2021 Cath: LM nl, LAD 40p, 75m, LCX/OM1 min irregs, RCA nl->Med rx.   H/O mechanical aortic valve replacement    a. 08/2018 in setting of bicuspid AoV and Asc Ao dissection-->38mm SJM mech AVR; b. 03/2020 Echo: EF 55-60%. No rwma. Mild LVH. Nl RV fxn. Nl functioning AVR.   Hyperlipidemia    Hypertension    Pericardial effusion    a. 08/2018 post-op -->s/p window.    Past Surgical History:  Procedure Laterality Date   BENTALL PROCEDURE N/A 09/19/2018   Procedure: BENTALL PROCEDURE WITH 23 ST. JUDE GRAFT CONDUIT.;  Surgeon: Alleen Borne, MD;  Location: [facility] OR;  Service: Open Heart Surgery;  Laterality: N/A;   CARDIAC SURGERY     CLEFT LIP REPAIR     CORONARY ANGIOGRAPHY Left 06/13/2021   Procedure: CORONARY ANGIOGRAPHY (CATH LAB);  Surgeon: Iran Ouch, MD;  Location: [facility] INVASIVE CV LAB;  Service: Cardiovascular;  Laterality: Left;   CORONARY PRESSURE/FFR WITH 3D MAPPING N/A 06/13/2021   Procedure: Coronary Pressure Wire, IFR;  Surgeon: Iran Ouch, MD;  Location: [facility] INVASIVE CV LAB;  Service: Cardiovascular;  Laterality: N/A;   ESOPHAGOGASTRODUODENOSCOPY (EGD) WITH PROPOFOL N/A 05/26/2022   Procedure: ESOPHAGOGASTRODUODENOSCOPY (EGD) WITH PROPOFOL;  Surgeon: Toney Reil, MD;  Location[facility] ENDOSCOPY;  Service: Gastroenterology;  Laterality: N/A;   SUBXYPHOID PERICARDIAL WINDOW N/A 09/26/2018   Procedure: SUBXYPHOID PERICARDIAL WINDOW;  Surgeon: Alleen Borne, MD;  Location: [facility] OR;  Service: Thoracic;   Laterality: N/A;   TEE WITHOUT CARDIOVERSION N/A 09/19/2018   Procedure: TRANSESOPHAGEAL ECHOCARDIOGRAM (TEE);  Surgeon: Alleen Borne, MD;  Location: [facility] OR;  Service: Open Heart Surgery;  Laterality: N/A;   WISDOM TOOTH EXTRACTION      Current Medications: Current Meds  Medication Sig   ALPRAZolam (XANAX) 0.5 MG tablet Take 1 tablet (0.5 mg total) by mouth 2 (two) times daily as needed for anxiety.   amoxicillin-clavulanate (AUGMENTIN) 875-125 MG tablet Take 1 tablet by mouth every 12 (twelve) hours.   atorvastatin (LIPITOR) 80 MG tablet Take 1 tablet (80 mg total) by mouth daily.   Bempedoic Acid-Ezetimibe (NEXLIZET) 180-10 MG TABS Take 1 tablet by mouth daily.   busPIRone (BUSPAR) 5 MG tablet TAKE ONE TABLET BY MOUTH TWICE A DAY   cetirizine (ZYRTEC) 10 MG tablet Take 10 mg by mouth daily as needed for allergies.   clobetasol cream (TEMOVATE) 0.05 % Apply 1 application topically 2 (two) times daily. To external ear canals   dicyclomine (BENTYL) 20 MG tablet Take 1 tablet (20 mg total) by mouth 2 (two) times daily.   fenofibrate (TRICOR) 145 MG tablet Take 1 tablet (145 mg total) by mouth daily.   isosorbide mononitrate (IMDUR) 30 MG 24 hr tablet Take 1 tablet (30 mg total) by mouth in the morning and at bedtime.  methocarbamol (ROBAXIN) 500 MG tablet Take 1 tablet (500 mg total) by mouth at bedtime.   metoprolol succinate (TOPROL-XL) 50 MG 24 hr tablet Take 1 1/2 tabs (75mg ) by mouth twice a day   omeprazole (PRILOSEC) 40 MG capsule TAKE ONE CAPSULE BY MOUTH ONE TIME DAILY BEFORE BREAKFAST   ondansetron (ZOFRAN-ODT) 4 MG disintegrating tablet Take 1 tablet (4 mg total) by mouth every 8 (eight) hours as needed.   OTEZLA 30 MG TABS Take 1 tablet by mouth 2 (two) times daily.   warfarin (COUMADIN) 5 MG tablet Take 1 tablet to 1.5 tablets by mouth daily or as directed by the Anticoagulation Clinic.   ZORYVE 0.3 % CREA SMARTSIG:sparingly Topical Daily    Allergies:   Patient has no known  allergies.   Social History   Socioeconomic History   Marital status: Married    Spouse name: Not on file   Number of children: 0   Years of education: Not on file   Highest education level: Not on file  Occupational History   Not on file  Tobacco Use   Smoking status: Never   Smokeless tobacco: Never  Vaping Use   Vaping status: Former  Substance and Sexual Activity   Alcohol use: Yes   Drug use: Never   Sexual activity: Yes    Partners: Female    Birth control/protection: Condom  Other Topics Concern   Not on file  Social History Narrative   Not on file   Social Determinants of Health   Financial Resource Strain: Low Risk  (09/30/2021)   Overall Financial Resource Strain (CARDIA)    Difficulty of Paying Living Expenses: Not hard at all  Food Insecurity: No Food Insecurity (09/30/2021)   Hunger Vital Sign    Worried About Running Out of Food in the Last Year: Never true    Ran Out of Food in the Last Year: Never true  Transportation Needs: No Transportation Needs (09/30/2021)   PRAPARE - Administrator, Civil Service (Medical): No    Lack of Transportation (Non-Medical): No  Physical Activity: Not on file  Stress: Not on file  Social Connections: Not on file     Family History:  The patient's family history includes Anxiety disorder in his brother and father; Depression in his brother; Diabetes in his father; Heart attack in his mother.  ROS:   12-point review of systems is negative unless otherwise noted in the HPI.   EKGs/Labs/Other Studies Reviewed:    Studies reviewed were summarized above. The additional studies were reviewed today:  2D echo 04/23/2022: 1. Left ventricular ejection fraction, by estimation, is 60 to 65%. The  left ventricle has normal function. The left ventricle has no regional  wall motion abnormalities. Left ventricular diastolic parameters were  normal.   2. Right ventricular systolic function is normal. The right  ventricular  size is normal. There is normal pulmonary artery systolic pressure. The  estimated right ventricular systolic pressure is 23.3 mmHg.   3. The mitral valve is normal in structure. No evidence of mitral valve  regurgitation. No evidence of mitral stenosis.   4. The aortic valve has been repaired/replaced. Aortic valve  regurgitation is not visualized. No aortic stenosis is present. Aortic  valve mean gradient measures 14.0 mmHg. Aortic valve Vmax measures 2.41  m/s.   5. The inferior vena cava is normal in size with greater than 50%  respiratory variability, suggesting right atrial pressure of 3 mmHg.   Comparison(s): 04/03/20-Normal  systolic and diastolic function, normal  functioning mechanical aortic valve prosthesis. EF 55-60%.  __________  Coronary CTA 06/09/2022: FINDINGS: Aorta:  Normal size.  S/p aortic root repair.  No dissection.   Aortic Valve: Mechanical aortic valve prosthesis noted.   Coronary Arteries:  Normal coronary origin.  Right dominance.   RCA is a dominant artery that gives rise to PDA and PLA. There is calcified plaque in the mid to distal vessel causing minimal stenosis (<25%).   Left main is a large artery that gives rise to LAD and LCX arteries. There is no LM disease.   LAD has calcified and non calcified plaque int he proximal segment causing mild stenosis (25-49%). There is calcified plaque in the mid segment causing severe stenosis (>70%).   LCX is a non-dominant artery that gives rise to a small OM1 branch. There is calcified plaque in the proximal LCx causing minimal stenosis (<25%).   Other findings:   Normal pulmonary vein drainage into the left atrium.   Normal left atrial appendage without a thrombus.   Normal size of the pulmonary artery.   IMPRESSION: 1. High coronary calcium score of 958. 2. Normal coronary origin with right dominance. 3. Severe mid LAD stenosis (>70%). 4. Minimal proximal LCX and mid RCA stenosis  (<25%) 5. CAD-RADS 4 Severe stenosis. (70-99% or > 50% left main). Cardiac catheterization is recommended. 6. Additional analysis with CT FFR will be submitted and reported separately.  ctFFR: 1. Left Main:  No significant stenosis.   2. LAD: No significant stenosis.  FFRct 0.84 3. LCX: No significant stenosis.  FFRct 0.94 4. RCA: No significant stenosis.  FFRct 0.94   IMPRESSION: 1.  CT FFR analysis didn't show any significant stenosis. __________  2D echo 04/02/2020: 1. Left ventricular ejection fraction, by estimation, is 55 to 60%. The  left ventricle has normal function. The left ventricle has no regional  wall motion abnormalities. There is mild left ventricular hypertrophy.  Left ventricular diastolic parameters  were normal.   2. Right ventricular systolic function is normal. The right ventricular  size is normal. There is normal pulmonary artery systolic pressure.   3. Right atrial size was mildly dilated.   4. The mitral valve is grossly normal. No evidence of mitral valve  regurgitation.   5. The aortic valve has been repaired/replaced. Aortic valve  regurgitation is not visualized. There is a 23 mm St. Jude bileaflet valve  present in the aortic position. Procedure Date: 09/19/18. Echo findings are  consistent with normal structure and  function of the aortic valve prosthesis.   6. The inferior vena cava is normal in size with greater than 50%  respiratory variability, suggesting right atrial pressure of 3 mmHg.  __________  Intraoperative TEE 09/19/2018: POST-OP IMPRESSIONS  - Aorta: There was an ascending prosthetic graft present.  - Left Atrial Appendage: The left atrial appendage appears unchanged from  pre-bypass.  - Aortic Valve: No stenosis present. Normal washing jets for valve  type.There  was a bileaflet mechanical prosthetic valve in the aortic position. Both  leaflets opened and closed normally with normal appearing washing jets of  aortic   insufficiency. The mean trans-aortic gradient was 7 mm hg.  - Mitral Valve: The mitral valve appears unchanged from pre-bypass.  - Tricuspid Valve: The tricuspid valve appears unchanged from pre-bypass.  - Interatrial Septum: The interatrial septum appears unchanged from  pre-bypass.  __________  CTA coronary morphology 09/16/2018: IMPRESSION: 1. Ascending aorta/root aneurysm with dissection flap as described  in radiology report.   2.  Suspect bicuspid aortic valve.   3. Coronary artery calcium score 476 Agatston units, placing the patient in the 99th percentile for age and gender, suggesting high risk for future cardiac events. Age advanced coronary disease.   4.  Nonobstructive coronary disease in RCA, LCx, and left main.   5. Extensive calcified plaque in the proximal to mid LAD. There is concern for moderate-severe mid LAD stenosis but may be artifact from shadowing from adjacent calcification. Will send for FFR to confirm. __________  2D echo 09/16/2018: 1. The left ventricle has normal systolic function with an ejection  fraction of 60-65%. The cavity size was normal. Left ventricular diastolic  parameters were normal. No evidence of left ventricular regional wall  motion abnormalities.   2. The right ventricle has normal systolic function. The cavity was  normal. There is no increase in right ventricular wall thickness. Right  ventricular systolic pressure could not be assessed.   3. Small pericardial effusion.   4. The pericardial effusion is circumferential.   5. The aortic valve was not well visualized.   6. There is severe dilatation of the aortic root and of the ascending  aorta measuring >50 mm.    EKG:  EKG is not ordered today.    Recent Labs: 10/05/2022: TSH 0.235 11/23/2022: ALT 61; BUN 14; Creatinine, Ser 0.71; Hemoglobin 14.5; Platelets 216; Potassium 3.9; Sodium 136  Recent Lipid Panel    Component Value Date/Time   CHOL 123 09/11/2022 0653   CHOL  211 (H) 09/30/2021 1118   TRIG 175 (H) 09/11/2022 0653   HDL 36 (L) 09/11/2022 0653   HDL 46 09/30/2021 1118   CHOLHDL 3.4 09/11/2022 0653   VLDL 35 09/11/2022 0653   LDLCALC 52 09/11/2022 0653   LDLCALC 133 (H) 09/30/2021 1118    PHYSICAL EXAM:    VS:  BP 102/64 (BP Location: Left Arm, Patient Position: Sitting, Cuff Size: Normal)   Pulse 72   Ht 5\' 7"  (1.702 m)   Wt 170 lb (77.1 kg)   SpO2 97%   BMI 26.63 kg/m   BMI: Body mass index is 26.63 kg/m.  Physical Exam Vitals reviewed.  Constitutional:      Appearance: He is well-developed.  HENT:     Head: Normocephalic and atraumatic.  Eyes:     General:        Right eye: No discharge.        Left eye: No discharge.  Neck:     Vascular: No JVD.  Cardiovascular:     Rate and Rhythm: Normal rate and regular rhythm.     Pulses:          Posterior tibial pulses are 2+ on the right side and 2+ on the left side.     Heart sounds: S1 normal and S2 normal. Heart sounds not distant. No midsystolic click and no opening snap. Murmur heard.     Systolic murmur is present. Mechanical click.     No friction rub.  Pulmonary:     Effort: Pulmonary effort is normal. No respiratory distress.     Breath sounds: Normal breath sounds. No decreased breath sounds, wheezing or rales.  Chest:     Chest wall: No tenderness.  Abdominal:     General: There is no distension.  Musculoskeletal:     Cervical back: Normal range of motion.     Right lower leg: No edema.     Left lower leg: No edema.  Skin:    General: Skin is warm and dry.     Nails: There is no clubbing.  Neurological:     Mental Status: He is alert and oriented to person, place, and time.  Psychiatric:        Speech: Speech normal.        Behavior: Behavior normal.        Thought Content: Thought content normal.        Judgment: Judgment normal.     Wt Readings from Last 3 Encounters:  01/11/23 170 lb (77.1 kg)  11/23/22 164 lb (74.4 kg)  10/15/22 172 lb 12.8 oz (78.4  kg)     ASSESSMENT & PLAN:   CAD involving the native coronary arteries without angina: Doing well, without symptoms of angina or cardiac decompensation.  Continue current medical therapy and primary prevention including warfarin in place of aspirin given history of mechanical aortic valve, atorvastatin, Nexlizet, and Tricor.  No indication for further ischemic testing at this time.  History of bicuspid aortic valve status post mechanical AVR and aortic root and ascending aorta aneurysm complicated by dissection: Status post Bentall procedure.  Stable aortic root repair by CTA last month.  Normal aortic valve prosthesis function by echo in 03/2022.  SBE prophylaxis is indicated for all dental procedures.  Remains on warfarin and is followed by the Coumadin clinic.  HTN: Blood pressure is well-controlled in the office today.  HLD: LDL 52 with triglycerides 409 in 08/2022.  Minimize carbs.  Remains on atorvastatin, bempedoic acid/ezetimibe, and Tricor.  Dizziness: Appears to be consistent with orthostasis.  However, with bilateral upper extremity heaviness we will pursue carotid artery ultrasound to evaluate the subclavian arteries as well.   Disposition: F/u with Dr. Azucena Cecil or an APP in 6 months.   Medication Adjustments/Labs and Tests Ordered: Current medicines are reviewed at length with the patient today.  Concerns regarding medicines are outlined above. Medication changes, Labs and Tests ordered today are summarized above and listed in the Patient Instructions accessible in Encounters.   Signed, Eula Listen, PA-C 01/11/2023 9:49 AM     [facility] -  [address] [place], [place] [address] [phone]

## 2023-01-11 ENCOUNTER — Ambulatory Visit: Payer: PRIVATE HEALTH INSURANCE | Attending: Physician Assistant | Admitting: Physician Assistant

## 2023-01-11 ENCOUNTER — Encounter: Payer: Self-pay | Admitting: Physician Assistant

## 2023-01-11 VITALS — BP 102/64 | HR 72 | Ht 67.0 in | Wt 170.0 lb

## 2023-01-11 DIAGNOSIS — Q231 Congenital insufficiency of aortic valve: Secondary | ICD-10-CM

## 2023-01-11 DIAGNOSIS — E785 Hyperlipidemia, unspecified: Secondary | ICD-10-CM

## 2023-01-11 DIAGNOSIS — Z952 Presence of prosthetic heart valve: Secondary | ICD-10-CM

## 2023-01-11 DIAGNOSIS — Z8679 Personal history of other diseases of the circulatory system: Secondary | ICD-10-CM

## 2023-01-11 DIAGNOSIS — I7121 Aneurysm of the ascending aorta, without rupture: Secondary | ICD-10-CM | POA: Diagnosis not present

## 2023-01-11 DIAGNOSIS — I2510 Atherosclerotic heart disease of native coronary artery without angina pectoris: Secondary | ICD-10-CM

## 2023-01-11 DIAGNOSIS — I10 Essential (primary) hypertension: Secondary | ICD-10-CM

## 2023-01-11 DIAGNOSIS — Z9889 Other specified postprocedural states: Secondary | ICD-10-CM

## 2023-01-11 DIAGNOSIS — R42 Dizziness and giddiness: Secondary | ICD-10-CM

## 2023-01-11 NOTE — Patient Instructions (Signed)
Medication Instructions:  Your Physician recommend you continue on your current medication as directed.     *If you need a refill on your cardiac medications before your next appointment, please call your pharmacy*   Lab Work: NONE   If you have labs (blood work) drawn today and your tests are completely normal, you will receive your results only by: MyChart Message (if you have MyChart) OR A paper copy in the mail If you have any lab test that is abnormal or we need to change your treatment, we will call you to review the results.   Testing/Procedures: Your physician has requested that you have a carotid duplex. This test is an ultrasound of the carotid arteries in your neck. It looks at blood flow through these arteries that supply the brain with blood.   Allow one hour for this exam.  There are no restrictions or special instructions.  This will take place at [address] [address], [place] [address]    Follow-Up: At [facility], you and your health needs are our priority.  As part of our continuing mission to provide you with exceptional heart care, we have created designated Provider Care Teams.  These Care Teams include your primary Cardiologist (physician) and Advanced Practice Providers (APPs -  Physician Assistants and Nurse Practitioners) who all work together to provide you with the care you need, when you need it.  We recommend signing up for the patient portal called "MyChart".  Sign up information is provided on this After Visit Summary.  MyChart is used to connect with patients for Virtual Visits (Telemedicine).  Patients are able to view lab/test results, encounter notes, upcoming appointments, etc.  Non-urgent messages can be sent to your provider as well.   To learn more about what you can do with MyChart, go to [email or web address].    Your next appointment:   6 month(s)  Provider:   You may see Debbe Odea, MD or one of  the following Advanced Practice Providers on your designated Care Team:    Eula Listen, [place]

## 2023-01-13 ENCOUNTER — Other Ambulatory Visit: Payer: Self-pay

## 2023-01-13 MED ORDER — NEXLIZET 180-10 MG PO TABS: 1.0000 | ORAL_TABLET | Freq: Every day | ORAL | 3 refills | Status: DC

## 2023-01-18 ENCOUNTER — Other Ambulatory Visit: Payer: Self-pay

## 2023-01-18 DIAGNOSIS — Z7901 Long term (current) use of anticoagulants: Secondary | ICD-10-CM

## 2023-01-18 DIAGNOSIS — Z5181 Encounter for therapeutic drug level monitoring: Secondary | ICD-10-CM

## 2023-01-18 MED ORDER — WARFARIN SODIUM 5 MG PO TABS
ORAL_TABLET | ORAL | 0 refills | Status: DC
Start: 2023-01-18 — End: 2023-04-06

## 2023-01-18 NOTE — Telephone Encounter (Signed)
Prescription refill request received for warfarin Lov: 01/11/23 Shea Evans)  Next INR check: 01/20/23 Warfarin tablet strength: 5mg   Appropriate dose. Refill sent.

## 2023-01-20 ENCOUNTER — Ambulatory Visit: Payer: PRIVATE HEALTH INSURANCE | Attending: Cardiology

## 2023-01-20 DIAGNOSIS — Z952 Presence of prosthetic heart valve: Secondary | ICD-10-CM

## 2023-01-20 DIAGNOSIS — Z5181 Encounter for therapeutic drug level monitoring: Secondary | ICD-10-CM

## 2023-01-20 LAB — POCT INR: INR: 3.1 — AB (ref 2.0–3.0)

## 2023-01-20 NOTE — Patient Instructions (Signed)
Continue warfarin 1.5 tablets daily except 1 tablet Mondays and Fridays. Recheck INR in 6 weeks  [phone]

## 2023-01-22 ENCOUNTER — Encounter: Payer: Self-pay | Admitting: Cardiology

## 2023-01-22 ENCOUNTER — Other Ambulatory Visit: Payer: Self-pay | Admitting: Cardiology

## 2023-01-22 MED ORDER — METOPROLOL SUCCINATE ER 50 MG PO TB24: ORAL_TABLET | ORAL | 11 refills | Status: DC

## 2023-01-23 ENCOUNTER — Other Ambulatory Visit: Payer: Self-pay | Admitting: Internal Medicine

## 2023-01-23 DIAGNOSIS — F411 Generalized anxiety disorder: Secondary | ICD-10-CM

## 2023-01-26 ENCOUNTER — Ambulatory Visit: Payer: BC Managed Care – PPO | Admitting: Internal Medicine

## 2023-01-26 NOTE — Progress Notes (Deleted)
Date:  01/26/2023   Name:  Henry Duncan   DOB:  25-Jan-1991   MRN:  [id number]   Chief Complaint: No chief complaint on file.  Diabetes He presents for his follow-up diabetic visit. Diabetes type: prediabetes. His disease course has been stable. Current diabetic treatment includes diet.    Lab Results  Component Value Date   NA 136 11/23/2022   K 3.9 11/23/2022   CO2 23 11/23/2022   GLUCOSE 120 (H) 11/23/2022   BUN 14 11/23/2022   CREATININE 0.71 11/23/2022   CALCIUM 9.0 11/23/2022   EGFR 124 10/05/2022   GFRNONAA >60 11/23/2022   Lab Results  Component Value Date   CHOL 123 09/11/2022   HDL 36 (L) 09/11/2022   LDLCALC 52 09/11/2022   TRIG 175 (H) 09/11/2022   CHOLHDL 3.4 09/11/2022   Lab Results  Component Value Date   TSH 0.235 (L) 10/05/2022   Lab Results  Component Value Date   HGBA1C 5.8 (H) 10/05/2022   Lab Results  Component Value Date   WBC 7.7 11/23/2022   HGB 14.5 11/23/2022   HCT 43.7 11/23/2022   MCV 89.9 11/23/2022   PLT 216 11/23/2022   Lab Results  Component Value Date   ALT 61 (H) 11/23/2022   AST 64 (H) 11/23/2022   ALKPHOS 44 11/23/2022   BILITOT 0.8 11/23/2022   No results found for: "25OHVITD2", "25OHVITD3", "VD25OH"   Review of Systems  Patient Active Problem List   Diagnosis Date Noted   Esophagitis, erosive 05/26/2022   Chronic GERD 05/26/2022   OSA (obstructive sleep apnea) 04/15/2022   Plaque psoriasis 04/14/2022   Mixed hyperlipidemia 09/09/2021   Onychodystrophy 06/25/2021   Neoplasm of uncertain behavior of skin 06/25/2021   Generalized anxiety disorder 06/25/2021   Abnormal cardiac CT angiography    Coronary artery disease involving native coronary artery of native heart    Warfarin anticoagulation 03/15/2019   Encounter for therapeutic drug monitoring 09/29/2018   Aortic valve prosthesis present 09/29/2018   S/P ascending aortic aneurysm repair 09/19/2018   Allergic rhinitis 09/06/2018   Hepatic  steatosis 10/30/2016   Insomnia 11/29/2015   Migraine 11/29/2015    No Known Allergies  Past Surgical History:  Procedure Laterality Date   BENTALL PROCEDURE N/A 09/19/2018   Procedure: BENTALL PROCEDURE WITH 23 ST. JUDE GRAFT CONDUIT.;  Surgeon: Alleen Borne, MD;  Location: [facility];  Service: Open Heart Surgery;  Laterality: N/A;   CARDIAC SURGERY     CLEFT LIP REPAIR     CORONARY ANGIOGRAPHY Left 06/13/2021   Procedure: CORONARY ANGIOGRAPHY (CATH LAB);  Surgeon: Iran Ouch, MD;  Location: [facility] INVASIVE CV LAB;  Service: Cardiovascular;  Laterality: Left;   CORONARY PRESSURE/FFR WITH 3D MAPPING N/A 06/13/2021   Procedure: Coronary Pressure Wire, IFR;  Surgeon: Iran Ouch, MD;  Location: [facility] INVASIVE CV LAB;  Service: Cardiovascular;  Laterality: N/A;   ESOPHAGOGASTRODUODENOSCOPY (EGD) WITH PROPOFOL N/A 05/26/2022   Procedure: ESOPHAGOGASTRODUODENOSCOPY (EGD) WITH PROPOFOL;  Surgeon: Toney Reil, MD;  Location[facility] ENDOSCOPY;  Service: Gastroenterology;  Laterality: N/A;   SUBXYPHOID PERICARDIAL WINDOW N/A 09/26/2018   Procedure: SUBXYPHOID PERICARDIAL WINDOW;  Surgeon: Alleen Borne, MD;  Location: [facility] OR;  Service: Thoracic;  Laterality: N/A;   TEE WITHOUT CARDIOVERSION N/A 09/19/2018   Procedure: TRANSESOPHAGEAL ECHOCARDIOGRAM (TEE);  Surgeon: Alleen Borne, MD;  Location: [facility] OR;  Service: Open Heart Surgery;  Laterality: N/A;   WISDOM TOOTH EXTRACTION  Social History   Tobacco Use   Smoking status: Never   Smokeless tobacco: Never  Vaping Use   Vaping status: Former  Substance Use Topics   Alcohol use: Yes   Drug use: Never     Medication list has been reviewed and updated.  No outpatient medications have been marked as taking for the 01/26/23 encounter (Appointment) with Reubin Milan, MD.       10/05/2022    8:07 AM 02/25/2022    3:57 PM 01/06/2022   11:27 AM 09/30/2021   10:51 AM  GAD 7 : Generalized Anxiety Score  Nervous, Anxious,  on Edge 0 1 1 0  Control/stop worrying 0 0 1 1  Worry too much - different things 0 0 2 0  Trouble relaxing 0 0 1 0  Restless 0 0 1 0  Easily annoyed or irritable 0 0 1 1  Afraid - awful might happen 0 0 2 0  Total GAD 7 Score 0 1 9 2   Anxiety Difficulty Not difficult at all Not difficult at all Somewhat difficult Not difficult at all       10/05/2022    8:07 AM 02/25/2022    3:56 PM 01/06/2022   11:27 AM  Depression screen PHQ 2/9  Decreased Interest 0 0 1  Down, Depressed, Hopeless 0 0 1  PHQ - 2 Score 0 0 2  Altered sleeping 2 0 0  Tired, decreased energy 1 0 0  Change in appetite 0 0 0  Feeling bad or failure about yourself  0 0 0  Trouble concentrating 0 0 0  Moving slowly or fidgety/restless 0 0 0  Suicidal thoughts 0 0 0  PHQ-9 Score 3 0 2  Difficult doing work/chores Not difficult at all Not difficult at all Not difficult at all    BP Readings from Last 3 Encounters:  01/11/23 102/64  12/30/22 112/78  11/26/22 98/66    Physical Exam  Wt Readings from Last 3 Encounters:  01/11/23 170 lb (77.1 kg)  11/23/22 164 lb (74.4 kg)  10/15/22 172 lb 12.8 oz (78.4 kg)    There were no vitals taken for this visit.  Assessment and Plan:  Problem List Items Addressed This Visit   None   No follow-ups on file.    Reubin Milan, MD [facility] [place]

## 2023-01-26 NOTE — Telephone Encounter (Signed)
Requested Prescriptions  Pending Prescriptions Disp Refills   busPIRone (BUSPAR) 5 MG tablet [Pharmacy Med Name: BUSPIRONE 5 MG TAB[*]] 180 tablet 0    Sig: TAKE ONE TABLET BY MOUTH TWICE A DAY     Psychiatry: Anxiolytics/Hypnotics - Non-controlled Passed - 01/23/2023 10:47 AM      Passed - Valid encounter within last 12 months    Recent Outpatient Visits           3 months ago Annual physical exam   [place] [facility] at [facility], Nyoka Cowden, MD   11 months ago Acute left-sided thoracic back pain   [place] [facility] at [facility], Nyoka Cowden, MD   1 year ago Generalized anxiety disorder   [facility] at [facility], Nyoka Cowden, MD   1 year ago Annual physical exam   [facility] at [facility], Nyoka Cowden, MD   1 year ago Generalized anxiety disorder   [facility] at [facility], Nyoka Cowden, MD       Future Appointments             Today Reubin Milan, MD [facility] at [facility]

## 2023-01-31 ENCOUNTER — Encounter: Payer: Self-pay | Admitting: Cardiology

## 2023-02-01 MED ORDER — AMOXICILLIN 500 MG PO CAPS: 2000.0000 mg | ORAL_CAPSULE | Freq: Once | ORAL | 0 refills | Status: AC

## 2023-02-04 ENCOUNTER — Other Ambulatory Visit: Payer: Self-pay

## 2023-02-04 MED ORDER — FENOFIBRATE 145 MG PO TABS: 145.0000 mg | ORAL_TABLET | Freq: Every day | ORAL | 3 refills | Status: DC

## 2023-02-05 ENCOUNTER — Other Ambulatory Visit: Payer: Self-pay

## 2023-02-09 NOTE — Progress Notes (Signed)
Henry Amy, PA-C [address]  [address]  [place], [place] [address]  Main: [phone]  Fax: [phone]   Primary Care Physician: Reubin Milan, MD  Primary Gastroenterologist:  Henry Amy, PA-C / Dr. Lannette Donath    CC: Follow-up erosive esophagitis and Acute Colitis  HPI: Henry Duncan is a 32 y.o. male, established patient Dr. Allegra Lai, returns for 103-month follow-up of GERD with erosive esophagitis.  History of bicuspid aortic valve repair in 2020, on chronic anticoagulation with Coumadin.  Followed by cardiologist.  EGD 05/2022 by Dr. Allegra Lai showed mild LA Grade A reflux esophagitis.  Normal stomach and duodenum.  He was started on Prilosec 40mg  BID x 3 months with benefit.  Heartburn and upper abdominal pain resolved.  He is currently taking omeprazole 40 mg once daily dose with good control of acid reflux.  He needs medication refill.  He went to [facility] ED 11/23/2022 to evaluate acute abdominal pain with nausea.  No vomiting or diarrhea.  CTA chest, abdomen, pelvis showed mild wall thickening and surrounding inflammation involving the hepatic flexure, concerning for mild infectious or inflammatory colitis.  No evidence of acute aortic syndrome.  Labs showed normal CBC (WBC 7.7, Hgb 14.5g).  Mildly elevated liver transaminases.  Normal lipase and CMP.  He was treated with Augmentin.  Since then he has not had any more lower abdominal pain, or diarrhea.  He denies rectal bleeding or unintentional weight loss.  No family history of colon cancer or IBD.  No current GI symptoms.   Current Outpatient Medications  Medication Sig Dispense Refill   ALPRAZolam (XANAX) 0.5 MG tablet Take 1 tablet (0.5 mg total) by mouth 2 (two) times daily as needed for anxiety. 15 tablet 5   atorvastatin (LIPITOR) 80 MG tablet Take 1 tablet (80 mg total) by mouth daily. 90 tablet 3   Bempedoic Acid-Ezetimibe (NEXLIZET) 180-10 MG TABS Take 1 tablet by mouth daily. 30 tablet 3   busPIRone  (BUSPAR) 5 MG tablet TAKE ONE TABLET BY MOUTH TWICE A DAY 180 tablet 0   cetirizine (ZYRTEC) 10 MG tablet Take 10 mg by mouth daily as needed for allergies.     clobetasol cream (TEMOVATE) 0.05 % Apply 1 application topically 2 (two) times daily. To external ear canals 30 g 0   fenofibrate (TRICOR) 145 MG tablet Take 1 tablet (145 mg total) by mouth daily. 30 tablet 3   isosorbide mononitrate (IMDUR) 30 MG 24 hr tablet Take 1 tablet (30 mg total) by mouth in the morning and at bedtime. 60 tablet 0   methocarbamol (ROBAXIN) 500 MG tablet Take 1 tablet (500 mg total) by mouth at bedtime. 30 tablet 0   metoprolol succinate (TOPROL-XL) 50 MG 24 hr tablet Take 1 1/2 tabs (75mg ) by mouth twice a day 90 tablet 11   ondansetron (ZOFRAN-ODT) 4 MG disintegrating tablet Take 1 tablet (4 mg total) by mouth every 8 (eight) hours as needed. 12 tablet 0   predniSONE (STERAPRED UNI-PAK 21 TAB) 10 MG (21) TBPK tablet Take by mouth daily. As directed 21 tablet 0   Vitamin D, Ergocalciferol, (DRISDOL) 1.25 MG (50000 UNIT) CAPS capsule Take 50,000 Units by mouth once a week.     warfarin (COUMADIN) 5 MG tablet Take 1 tablet to 1.5 tablets by mouth daily or as directed by the Anticoagulation Clinic. 135 tablet 0   ZORYVE 0.3 % CREA SMARTSIG:sparingly Topical Daily     omeprazole (PRILOSEC) 40 MG capsule Take 1 capsule (  40 mg total) by mouth daily. 90 capsule 3   No current facility-administered medications for this visit.    Allergies as of 02/10/2023   (No Known Allergies)    Past Medical History:  Diagnosis Date   Allergy    Anxiety    Aortic aneurysm (HCC) 09/16/2018   a. 08/2018 Asc Ao/root aneuryms w/ dissection-->s/p Bentall w/ 26mm Hemashield graft, 23mm SJM AVR, and reimplantation of cor arteries; b. 04/2021 CTA Chest: stable post-op changes.   CAD (coronary artery disease)    a. 08/2018 Cor CTA: Ca2+ 99th%'ile (476). Calcified plaque in mLAD. Nl LM, LCX, RCA. S/p reimplantation of coronary arteries in  setting of Bentall; b. 05/2021 Cor CTA: LAD >11m (nl FFRct); c. 05/2021 Cath: LM nl, LAD 40p, 24m, LCX/OM1 min irregs, RCA nl->Med rx.   H/O mechanical aortic valve replacement    a. 08/2018 in setting of bicuspid AoV and Asc Ao dissection-->59mm SJM mech AVR; b. 03/2020 Echo: EF 55-60%. No rwma. Mild LVH. Nl RV fxn. Nl functioning AVR.   Hyperlipidemia    Hypertension    Pericardial effusion    a. 08/2018 post-op -->s/p window.    Past Surgical History:  Procedure Laterality Date   BENTALL PROCEDURE N/A 09/19/2018   Procedure: BENTALL PROCEDURE WITH 23 ST. JUDE GRAFT CONDUIT.;  Surgeon: Alleen Borne, MD;  Location: [facility] OR;  Service: Open Heart Surgery;  Laterality: N/A;   CARDIAC SURGERY     CLEFT LIP REPAIR     CORONARY ANGIOGRAPHY Left 06/13/2021   Procedure: CORONARY ANGIOGRAPHY (CATH LAB);  Surgeon: Iran Ouch, MD;  Location: [facility] INVASIVE CV LAB;  Service: Cardiovascular;  Laterality: Left;   CORONARY PRESSURE/FFR WITH 3D MAPPING N/A 06/13/2021   Procedure: Coronary Pressure Wire, IFR;  Surgeon: Iran Ouch, MD;  Location: [facility] INVASIVE CV LAB;  Service: Cardiovascular;  Laterality: N/A;   ESOPHAGOGASTRODUODENOSCOPY (EGD) WITH PROPOFOL N/A 05/26/2022   Procedure: ESOPHAGOGASTRODUODENOSCOPY (EGD) WITH PROPOFOL;  Surgeon: Toney Reil, MD;  Location: [facility] ENDOSCOPY;  Service: Gastroenterology;  Laterality: N/A;   SUBXYPHOID PERICARDIAL WINDOW N/A 09/26/2018   Procedure: SUBXYPHOID PERICARDIAL WINDOW;  Surgeon: Alleen Borne, MD;  Location: [facility] OR;  Service: Thoracic;  Laterality: N/A;   TEE WITHOUT CARDIOVERSION N/A 09/19/2018   Procedure: TRANSESOPHAGEAL ECHOCARDIOGRAM (TEE);  Surgeon: Alleen Borne, MD;  Location: [facility] OR;  Service: Open Heart Surgery;  Laterality: N/A;   WISDOM TOOTH EXTRACTION      Review of Systems:    All systems reviewed and negative except where noted in HPI.   Physical Examination:   BP 99/64   Pulse 71   Temp 98.2 F (36.8 C)   Ht  5\' 7"  (1.702 m)   Wt 171 lb 12.8 oz (77.9 kg)   BMI 26.91 kg/m   General: Well-nourished, well-developed in no acute distress.  Lungs: Clear to auscultation bilaterally. Non-labored. Heart: Regular rate and rhythm;  Loud mechanical click consistent with mechanical valve.  2/6 systolic murmur.  No rubs or gallops.  Abdomen: Bowel sounds are normal; Abdomen is Soft; No hepatosplenomegaly, masses or hernias;  No Abdominal Tenderness; No guarding or rebound tenderness. Neuro: Alert and oriented x 3.  Grossly intact.  Psych: Alert and cooperative, normal mood and affect.  Imaging Studies: No results found.  Assessment and Plan:   BARNES SCHONBERGER is a 32 y.o. y/o male returns for f/u of Chronic GERD with esophagitis.  Also follow-up of acute colitis 11/23/2022.  GI symptoms are currently resolved.  Chronic GERD with LA Grade A Reflux Esophagitis - Controlled on omeprazole 40 Mg daily. Continue omeprazole 40mg  daily, #90 with 3 refills. Continue avoiding GERD trigger foods. Follow-up in 1 year to refill medication.  Sooner if recurrent GI symptoms.  2.  Abnormal CT scan of the colon. - Hepatic Flexure.  Most likely acute infectious colitis 11/23/2022 seen on CT scan.  Treated with Augmentin.  Currently asymptomatic.  I will discuss patient's case with Dr. Allegra Lai to decide if colonoscopy is necessary.  3.  History of bicuspid aortic valve repair in 2020; on chronic anticoagulation Coumadin.  Cardiologist Dr. Azucena Cecil.  Requesting cardiac clearance and permission to hold Coumadin 5 days prior to colonoscopy versus Lovenox bridging during procedure.  Pending cardiology response, then we can decide about scheduling a colonoscopy.    Henry Amy, PA-C  Follow up in 1 year or sooner if recurrent GI symptoms.

## 2023-02-10 ENCOUNTER — Telehealth: Payer: Self-pay | Admitting: Cardiology

## 2023-02-10 ENCOUNTER — Encounter: Payer: Self-pay | Admitting: Physician Assistant

## 2023-02-10 ENCOUNTER — Ambulatory Visit (INDEPENDENT_AMBULATORY_CARE_PROVIDER_SITE_OTHER): Payer: PRIVATE HEALTH INSURANCE | Admitting: Physician Assistant

## 2023-02-10 VITALS — BP 99/64 | HR 71 | Temp 98.2°F | Ht 67.0 in | Wt 171.8 lb

## 2023-02-10 DIAGNOSIS — Z7901 Long term (current) use of anticoagulants: Secondary | ICD-10-CM | POA: Diagnosis not present

## 2023-02-10 DIAGNOSIS — R933 Abnormal findings on diagnostic imaging of other parts of digestive tract: Secondary | ICD-10-CM

## 2023-02-10 DIAGNOSIS — K2100 Gastro-esophageal reflux disease with esophagitis, without bleeding: Secondary | ICD-10-CM

## 2023-02-10 DIAGNOSIS — Z952 Presence of prosthetic heart valve: Secondary | ICD-10-CM | POA: Diagnosis not present

## 2023-02-10 MED ORDER — OMEPRAZOLE 40 MG PO CPDR: 40.0000 mg | DELAYED_RELEASE_CAPSULE | Freq: Every day | ORAL | 3 refills | Status: DC

## 2023-02-10 NOTE — Telephone Encounter (Signed)
Patient with diagnosis of mechanical aortic valve on warfarin for anticoagulation.    Procedure: colonoscopy Date of procedure: TBD  CrCl 151 ml/min Platelet count 216  Per office protocol, patient can hold warfarin for 5 days prior to procedure.   Although guidelines would say patient does not need to be bridged due to lack of additional risk factor, MD has historically wanted patient bridged while off warfarin. Therefore will ask coumadin clinic to coordinate bridge.  **This guidance is not considered finalized until pre-operative APP has relayed final recommendations.**

## 2023-02-10 NOTE — Telephone Encounter (Signed)
Pre-operative Risk Assessment    Patient Name: Henry Duncan  DOB: 1990/11/05 MRN: [id number]      Request for Surgical Clearance    Procedure:   colonscopy  Date of Surgery:  Clearance TBD                                 Surgeon:  not indicated Surgeon's Group or Practice Name:   gastroenterology Phone number:  [phone] Fax number:  [phone]   Type of Clearance Requested:   - Medical    Type of Anesthesia:  General    Additional requests/questions:    SignedShawna Orleans   02/10/2023, 11:24 AM

## 2023-02-10 NOTE — Telephone Encounter (Signed)
Pre-operative Risk Assessment    Patient Name: Henry Duncan  DOB: 02/02/1991 MRN: [id number]      Request for Surgical Clearance    Procedure:   colonoscopy   Date of Surgery:  Clearance TBD                                 Surgeon:  not indicated Surgeon's Group or Practice Name:  [place] [facility]  Phone number:  [phone] Fax number:  [phone]   Type of Clearance Requested:   - Medical    Type of Anesthesia:  General    Additional requests/questions:    Courtney Heys   02/10/2023, 10:26 AM

## 2023-02-10 NOTE — Telephone Encounter (Signed)
Patient Name: Henry Duncan  DOB: [birth year] MRN: [id number]  Primary Cardiologist: Debbe Odea, MD  Chart reviewed as part of pre-operative protocol coverage. Pre-op clearance already addressed by colleagues in earlier phone notes. To summarize recommendations:  -Per office protocol, patient can hold warfarin for 5 days prior to procedure.   Although guidelines would say patient does not need to be bridged due to lack of additional risk factor, MD has historically wanted patient bridged while off warfarin. Therefore will ask coumadin clinic to coordinate bridge.    He was doing well when he was evaluated in the office and without symptoms of angina or cardiac decompensation.  He was able to achieve greater than 4 METs without cardiac limitation.  Per RCRI, he is class I with an estimated rate of 3.9% for adverse cardiac event in the perioperative timeframe.  He may proceed with low risk noncardiac procedure without further cardiac testing.   -Eula Listen, PA-C  Will route this bundled recommendation to requesting provider via Epic fax function and remove from pre-op pool. Please call with questions.  Sharlene Dory, PA-C 02/10/2023, 4:13 PM

## 2023-02-10 NOTE — Telephone Encounter (Signed)
He was doing well when he was evaluated in the office and without symptoms of angina or cardiac decompensation.  He was able to achieve greater than 4 METs without cardiac limitation.  Per RCRI, he is class I with an estimated rate of 3.9% for adverse cardiac event in the perioperative timeframe.  He may proceed with low risk noncardiac procedure without further cardiac testing.  Warfarin per pharmacy recommendations.

## 2023-02-11 ENCOUNTER — Telehealth: Payer: Self-pay

## 2023-02-11 ENCOUNTER — Encounter: Payer: Self-pay | Admitting: Physician Assistant

## 2023-02-11 NOTE — Telephone Encounter (Signed)
Called patient and left a message for call back

## 2023-02-11 NOTE — Telephone Encounter (Signed)
Pt has scheduled appt with [facility] on 03/03/23. Called pt and made him aware that he will need Lovenox bridging. Procedure has not been scheduled at this time; however, pt is aware that he should call Coumadin Clinic if procedure if scheduled prior to upcoming coumadin clinic appt on 10/9.

## 2023-02-11 NOTE — Telephone Encounter (Signed)
Patient called and verbalized understanding of instructions

## 2023-02-11 NOTE — Telephone Encounter (Signed)
Patient Cardiologist Debbe Odea cleared patient to have colonoscopy and to hold Warfarin 5 days before procedure They will also get bridge while off the Warfarin with Lovenox  they will coordinated this with coumadin clinic.  I see in your office visit not you were going to talk to Dr. Allegra Lai about colonoscopy. Please advise if you want me to schedule patient,

## 2023-02-12 ENCOUNTER — Telehealth: Payer: Self-pay | Admitting: Cardiology

## 2023-02-12 NOTE — Telephone Encounter (Signed)
Refer to previous clearance encounter.  Patient is following up. He states the performing office determined colonoscopy is no longer needed and cancelled his procedure. Patient will continue on Warfarin. Lovenox bridging is no longer needed and patient would like to make sure a prescription has not been processed for it--FYI.

## 2023-02-18 ENCOUNTER — Ambulatory Visit: Payer: PRIVATE HEALTH INSURANCE | Attending: Physician Assistant

## 2023-02-18 DIAGNOSIS — R42 Dizziness and giddiness: Secondary | ICD-10-CM

## 2023-02-19 ENCOUNTER — Ambulatory Visit: Payer: PRIVATE HEALTH INSURANCE | Admitting: Internal Medicine

## 2023-03-02 ENCOUNTER — Encounter: Payer: Self-pay | Admitting: Internal Medicine

## 2023-03-02 ENCOUNTER — Ambulatory Visit (INDEPENDENT_AMBULATORY_CARE_PROVIDER_SITE_OTHER): Payer: PRIVATE HEALTH INSURANCE | Admitting: Internal Medicine

## 2023-03-02 VITALS — BP 90/60 | HR 65 | Ht 67.0 in | Wt 174.4 lb

## 2023-03-02 DIAGNOSIS — F411 Generalized anxiety disorder: Secondary | ICD-10-CM

## 2023-03-02 DIAGNOSIS — L400 Psoriasis vulgaris: Secondary | ICD-10-CM | POA: Diagnosis not present

## 2023-03-02 DIAGNOSIS — H6122 Impacted cerumen, left ear: Secondary | ICD-10-CM | POA: Diagnosis not present

## 2023-03-02 MED ORDER — ALPRAZOLAM 0.5 MG PO TABS: 0.5000 mg | ORAL_TABLET | Freq: Two times a day (BID) | ORAL | 5 refills | Status: DC | PRN

## 2023-03-02 NOTE — Progress Notes (Signed)
Date:  03/02/2023   Name:  Henry Duncan   DOB:  29-Sep-1990   MRN:  [id number]   Chief Complaint: Anxiety  Anxiety Presents for follow-up visit. Symptoms include chest pain, excessive worry and nervous/anxious behavior. Patient reports no palpitations, restlessness or shortness of breath. Symptoms occur occasionally. The severity of symptoms is moderate. The quality of sleep is good.   Compliance with medications is 76-100%.  Ear fullness - he thinks he has cerumen in both ears complicated by psoriasis.    Review of Systems  Constitutional:  Negative for chills, fatigue and fever.  Respiratory:  Negative for shortness of breath.   Cardiovascular:  Positive for chest pain. Negative for palpitations.  Skin:  Positive for rash (psoriasis improved.).  Psychiatric/Behavioral:  The patient is nervous/anxious.      Lab Results  Component Value Date   NA 136 11/23/2022   K 3.9 11/23/2022   CO2 23 11/23/2022   GLUCOSE 120 (H) 11/23/2022   BUN 14 11/23/2022   CREATININE 0.71 11/23/2022   CALCIUM 9.0 11/23/2022   EGFR 124 10/05/2022   GFRNONAA >60 11/23/2022   Lab Results  Component Value Date   CHOL 123 09/11/2022   HDL 36 (L) 09/11/2022   LDLCALC 52 09/11/2022   TRIG 175 (H) 09/11/2022   CHOLHDL 3.4 09/11/2022   Lab Results  Component Value Date   TSH 0.235 (L) 10/05/2022   Lab Results  Component Value Date   HGBA1C 5.8 (H) 10/05/2022   Lab Results  Component Value Date   WBC 7.7 11/23/2022   HGB 14.5 11/23/2022   HCT 43.7 11/23/2022   MCV 89.9 11/23/2022   PLT 216 11/23/2022   Lab Results  Component Value Date   ALT 61 (H) 11/23/2022   AST 64 (H) 11/23/2022   ALKPHOS 44 11/23/2022   BILITOT 0.8 11/23/2022   No results found for: "25OHVITD2", "25OHVITD3", "VD25OH"   Patient Active Problem List   Diagnosis Date Noted   Esophagitis, erosive 05/26/2022   Chronic GERD 05/26/2022   OSA (obstructive sleep apnea) 04/15/2022   Plaque psoriasis  04/14/2022   Mixed hyperlipidemia 09/09/2021   Onychodystrophy 06/25/2021   Neoplasm of uncertain behavior of skin 06/25/2021   Generalized anxiety disorder 06/25/2021   Abnormal cardiac CT angiography    Coronary artery disease involving native coronary artery of native heart    Warfarin anticoagulation 03/15/2019   Encounter for therapeutic drug monitoring 09/29/2018   Aortic valve prosthesis present 09/29/2018   S/P ascending aortic aneurysm repair 09/19/2018   Allergic rhinitis 09/06/2018   Hepatic steatosis 10/30/2016   Insomnia 11/29/2015   Migraine 11/29/2015    No Known Allergies  Past Surgical History:  Procedure Laterality Date   BENTALL PROCEDURE N/A 09/19/2018   Procedure: BENTALL PROCEDURE WITH 23 ST. JUDE GRAFT CONDUIT.;  Surgeon: Alleen Borne, MD;  Location: [facility] OR;  Service: Open Heart Surgery;  Laterality: N/A;   CARDIAC SURGERY     CLEFT LIP REPAIR     CORONARY ANGIOGRAPHY Left 06/13/2021   Procedure: CORONARY ANGIOGRAPHY (CATH LAB);  Surgeon: Iran Ouch, MD;  Location: [facility] INVASIVE CV LAB;  Service: Cardiovascular;  Laterality: Left;   CORONARY PRESSURE/FFR WITH 3D MAPPING N/A 06/13/2021   Procedure: Coronary Pressure Wire, IFR;  Surgeon: Iran Ouch, MD;  Location: [facility] INVASIVE CV LAB;  Service: Cardiovascular;  Laterality: N/A;   ESOPHAGOGASTRODUODENOSCOPY (EGD) WITH PROPOFOL N/A 05/26/2022   Procedure: ESOPHAGOGASTRODUODENOSCOPY (EGD) WITH PROPOFOL;  Surgeon: Toney Reil,  MD;  Location: [facility] ENDOSCOPY;  Service: Gastroenterology;  Laterality: N/A;   SUBXYPHOID PERICARDIAL WINDOW N/A 09/26/2018   Procedure: SUBXYPHOID PERICARDIAL WINDOW;  Surgeon: Alleen Borne, MD;  Location: [facility] OR;  Service: Thoracic;  Laterality: N/A;   TEE WITHOUT CARDIOVERSION N/A 09/19/2018   Procedure: TRANSESOPHAGEAL ECHOCARDIOGRAM (TEE);  Surgeon: Alleen Borne, MD;  Location: [facility] OR;  Service: Open Heart Surgery;  Laterality: N/A;   WISDOM TOOTH EXTRACTION       Social History   Tobacco Use   Smoking status: Never   Smokeless tobacco: Never  Vaping Use   Vaping status: Former  Substance Use Topics   Alcohol use: Yes   Drug use: Never     Medication list has been reviewed and updated.  Current Meds  Medication Sig   atorvastatin (LIPITOR) 80 MG tablet Take 1 tablet (80 mg total) by mouth daily.   Bempedoic Acid-Ezetimibe (NEXLIZET) 180-10 MG TABS Take 1 tablet by mouth daily.   bimekizumab-bkzx (BIMZELX) 160 MG/ML pen Inject into the skin every 30 (thirty) days.   busPIRone (BUSPAR) 5 MG tablet TAKE ONE TABLET BY MOUTH TWICE A DAY   cetirizine (ZYRTEC) 10 MG tablet Take 10 mg by mouth daily as needed for allergies.   clobetasol cream (TEMOVATE) 0.05 % Apply 1 application topically 2 (two) times daily. To external ear canals   fenofibrate (TRICOR) 145 MG tablet Take 1 tablet (145 mg total) by mouth daily.   isosorbide mononitrate (IMDUR) 30 MG 24 hr tablet Take 1 tablet (30 mg total) by mouth in the morning and at bedtime.   methocarbamol (ROBAXIN) 500 MG tablet Take 1 tablet (500 mg total) by mouth at bedtime.   metoprolol succinate (TOPROL-XL) 50 MG 24 hr tablet Take 1 1/2 tabs (75mg ) by mouth twice a day   omeprazole (PRILOSEC) 40 MG capsule Take 1 capsule (40 mg total) by mouth daily.   ondansetron (ZOFRAN-ODT) 4 MG disintegrating tablet Take 1 tablet (4 mg total) by mouth every 8 (eight) hours as needed.   Vitamin D, Ergocalciferol, (DRISDOL) 1.25 MG (50000 UNIT) CAPS capsule Take 50,000 Units by mouth once a week.   warfarin (COUMADIN) 5 MG tablet Take 1 tablet to 1.5 tablets by mouth daily or as directed by the Anticoagulation Clinic.   ZORYVE 0.3 % CREA SMARTSIG:sparingly Topical Daily   [DISCONTINUED] ALPRAZolam (XANAX) 0.5 MG tablet Take 1 tablet (0.5 mg total) by mouth 2 (two) times daily as needed for anxiety.       03/02/2023    3:48 PM 10/05/2022    8:07 AM 02/25/2022    3:57 PM 01/06/2022   11:27 AM  GAD 7 :  Generalized Anxiety Score  Nervous, Anxious, on Edge 2 0 1 1  Control/stop worrying 1 0 0 1  Worry too much - different things 1 0 0 2  Trouble relaxing 1 0 0 1  Restless 0 0 0 1  Easily annoyed or irritable 0 0 0 1  Afraid - awful might happen 1 0 0 2  Total GAD 7 Score 6 0 1 9  Anxiety Difficulty Somewhat difficult Not difficult at all Not difficult at all Somewhat difficult       03/02/2023    3:48 PM 10/05/2022    8:07 AM 02/25/2022    3:56 PM  Depression screen PHQ 2/9  Decreased Interest 0 0 0  Down, Depressed, Hopeless 0 0 0  PHQ - 2 Score 0 0 0  Altered sleeping 1 2 0  Tired, decreased energy 1 1 0  Change in appetite 0 0 0  Feeling bad or failure about yourself  0 0 0  Trouble concentrating 0 0 0  Moving slowly or fidgety/restless 0 0 0  Suicidal thoughts 0 0 0  PHQ-9 Score 2 3 0  Difficult doing work/chores Not difficult at all Not difficult at all Not difficult at all    BP Readings from Last 3 Encounters:  03/02/23 90/60  02/10/23 99/64  01/11/23 102/64    Physical Exam Vitals and nursing note reviewed.  Constitutional:      General: He is not in acute distress.    Appearance: Normal appearance. He is well-developed.  HENT:     Head: Normocephalic and atraumatic.     Right Ear: Tympanic membrane and ear canal normal.     Left Ear: There is impacted cerumen.  Cardiovascular:     Rate and Rhythm: Normal rate and regular rhythm.     Comments: Mechanical valve click across the precordium Pulmonary:     Effort: Pulmonary effort is normal. No respiratory distress.     Breath sounds: No wheezing or rhonchi.  Musculoskeletal:     Cervical back: Normal range of motion.  Lymphadenopathy:     Cervical: No cervical adenopathy.  Skin:    General: Skin is warm and dry.     Findings: No rash.  Neurological:     Mental Status: He is alert and oriented to person, place, and time.  Psychiatric:        Mood and Affect: Mood normal.        Behavior: Behavior  normal.     Wt Readings from Last 3 Encounters:  03/02/23 174 lb 6.4 oz (79.1 kg)  02/10/23 171 lb 12.8 oz (77.9 kg)  01/11/23 170 lb (77.1 kg)    BP 90/60   Pulse 65   Ht 5\' 7"  (1.702 m)   Wt 174 lb 6.4 oz (79.1 kg)   SpO2 96%   BMI 27.31 kg/m   Assessment and Plan:  Problem List Items Addressed This Visit       Unprioritized   Generalized anxiety disorder - Primary (Chronic)    Anxiety controlled with Buspar daily and PRN Xanax Generally doing well without new health issues. GAD7 = 6 Continue Buspar; refill Xanax      Relevant Medications   ALPRAZolam (XANAX) 0.5 MG tablet   Plaque psoriasis    Now on Bimzelx injections with immediate improvement. Continue follow up with Dermatology      Other Visit Diagnoses     Impacted cerumen of left ear       Recommend ENT evaluation       Return in about 6 months (around 08/31/2023) for CPX.    Reubin Milan, MD [facility] [place]

## 2023-03-02 NOTE — Assessment & Plan Note (Addendum)
Anxiety controlled with Buspar daily and PRN Xanax Generally doing well without new health issues. GAD7 = 6 Continue Buspar; refill Xanax

## 2023-03-02 NOTE — Assessment & Plan Note (Signed)
Now on Bimzelx injections with immediate improvement. Continue follow up with Dermatology

## 2023-03-03 ENCOUNTER — Ambulatory Visit: Payer: PRIVATE HEALTH INSURANCE

## 2023-03-05 ENCOUNTER — Other Ambulatory Visit: Payer: Self-pay | Admitting: Internal Medicine

## 2023-03-05 DIAGNOSIS — F411 Generalized anxiety disorder: Secondary | ICD-10-CM

## 2023-03-17 ENCOUNTER — Ambulatory Visit: Payer: PRIVATE HEALTH INSURANCE | Attending: Cardiology

## 2023-03-20 ENCOUNTER — Encounter: Payer: Self-pay | Admitting: Internal Medicine

## 2023-03-22 ENCOUNTER — Other Ambulatory Visit: Payer: Self-pay | Admitting: Internal Medicine

## 2023-03-22 DIAGNOSIS — F5101 Primary insomnia: Secondary | ICD-10-CM

## 2023-03-22 MED ORDER — ALPRAZOLAM 1 MG PO TABS
1.0000 mg | ORAL_TABLET | Freq: Every evening | ORAL | 0 refills | Status: DC | PRN
Start: 2023-03-22 — End: 2023-07-28

## 2023-04-05 ENCOUNTER — Other Ambulatory Visit: Payer: Self-pay | Admitting: *Deleted

## 2023-04-05 DIAGNOSIS — I2510 Atherosclerotic heart disease of native coronary artery without angina pectoris: Secondary | ICD-10-CM

## 2023-04-05 MED ORDER — ISOSORBIDE MONONITRATE ER 30 MG PO TB24: 30.0000 mg | ORAL_TABLET | Freq: Two times a day (BID) | ORAL | 3 refills | Status: DC

## 2023-04-06 ENCOUNTER — Other Ambulatory Visit: Payer: Self-pay | Admitting: Cardiology

## 2023-04-06 DIAGNOSIS — Z5181 Encounter for therapeutic drug level monitoring: Secondary | ICD-10-CM

## 2023-04-06 DIAGNOSIS — Z7901 Long term (current) use of anticoagulants: Secondary | ICD-10-CM

## 2023-04-06 MED ORDER — WARFARIN SODIUM 5 MG PO TABS: ORAL_TABLET | ORAL | 0 refills | Status: DC

## 2023-04-06 NOTE — Telephone Encounter (Signed)
Warfarin 5mg  refill [employer] mechanical aortic valve  Last INR 01/20/23 & pending appt 04/14/23 Last OV 01/11/23

## 2023-04-07 ENCOUNTER — Other Ambulatory Visit: Payer: Self-pay

## 2023-04-07 MED ORDER — ATORVASTATIN CALCIUM 80 MG PO TABS: 80.0000 mg | ORAL_TABLET | Freq: Every day | ORAL | 0 refills | Status: DC

## 2023-04-14 ENCOUNTER — Ambulatory Visit: Payer: PRIVATE HEALTH INSURANCE | Attending: Cardiology

## 2023-04-14 DIAGNOSIS — Z952 Presence of prosthetic heart valve: Secondary | ICD-10-CM

## 2023-04-14 DIAGNOSIS — Z5181 Encounter for therapeutic drug level monitoring: Secondary | ICD-10-CM | POA: Diagnosis not present

## 2023-04-14 LAB — POCT INR: INR: 1.8 — AB (ref 2.0–3.0)

## 2023-04-14 NOTE — Patient Instructions (Signed)
TAKE 2.5 TABLETS TODAY ONLY THEN Continue warfarin 1.5 tablets daily except 1 tablet Mondays and Fridays. Recheck INR in 3 weeks  [phone]

## 2023-04-30 ENCOUNTER — Telehealth: Payer: Self-pay

## 2023-04-30 ENCOUNTER — Telehealth: Payer: Self-pay | Admitting: Cardiology

## 2023-04-30 ENCOUNTER — Ambulatory Visit
Admission: RE | Admit: 2023-04-30 | Discharge: 2023-04-30 | Disposition: A | Discharge status: Home / Self Care | Payer: PRIVATE HEALTH INSURANCE | Source: Ambulatory Visit | Attending: Physician Assistant | Admitting: Physician Assistant

## 2023-04-30 VITALS — BP 114/76 | HR 76 | Temp 97.8°F | Resp 18

## 2023-04-30 DIAGNOSIS — J0120 Acute ethmoidal sinusitis, unspecified: Secondary | ICD-10-CM

## 2023-04-30 MED ORDER — AMOXICILLIN-POT CLAVULANATE 875-125 MG PO TABS: 1.0000 | ORAL_TABLET | Freq: Two times a day (BID) | ORAL | 0 refills | Status: DC

## 2023-04-30 NOTE — ED Triage Notes (Signed)
Productive cough with green/yellow mucus, congestion x 2 months. Taking Nyquil, and mucinex with no relief of symptoms.

## 2023-04-30 NOTE — Telephone Encounter (Signed)
Pt c/o medication issue:  1. Name of Medication:   warfarin (COUMADIN) 5 MG tablet   2. How are you currently taking this medication (dosage and times per day)?  As prescribed  3. Are you having a reaction (difficulty breathing--STAT)?   4. What is your medication issue?   Wife stated patient has been prescribed a steroid and his warfarin medication will need to be adjusted.  Wife stated can call patient directly at [phone] for next steps.

## 2023-04-30 NOTE — Telephone Encounter (Signed)
See other telephone encounter.

## 2023-04-30 NOTE — ED Provider Notes (Signed)
Henry Duncan    CSN: [id number] Arrival date & time: 04/30/23  1514      History   Chief Complaint Chief Complaint  Patient presents with   Cough    Cough congestion - Entered by patient    HPI Henry Duncan is a 32 y.o. male.   Patient complains of a cough and congestion for the past 2 months.  Patient reports he has sinus pressure and sinus congestion.  Patient reports he is concerned that symptoms may be going to his chest.  Patient reports he has not had a fever or chills he does have some facial discomfort.  Patient has a past medical history of mechanical valve replacement.  The history is provided by the patient. No language interpreter was used.  Cough Cough characteristics:  Non-productive Sputum characteristics:  Nondescript Severity:  Moderate Onset quality:  Gradual Progression:  Worsening Chronicity:  New Relieved by:  Nothing Worsened by:  Nothing   Past Medical History:  Diagnosis Date   Allergy    Anxiety    Aortic aneurysm (HCC) 09/16/2018   a. 08/2018 Asc Ao/root aneuryms w/ dissection-->s/p Bentall w/ 26mm Hemashield graft, 23mm SJM AVR, and reimplantation of cor arteries; b. 04/2021 CTA Chest: stable post-op changes.   CAD (coronary artery disease)    a. 08/2018 Cor CTA: Ca2+ 99th%'ile (476). Calcified plaque in mLAD. Nl LM, LCX, RCA. S/p reimplantation of coronary arteries in setting of Bentall; b. 05/2021 Cor CTA: LAD >54m (nl FFRct); c. 05/2021 Cath: LM nl, LAD 40p, 59m, LCX/OM1 min irregs, RCA nl->Med rx.   H/O mechanical aortic valve replacement    a. 08/2018 in setting of bicuspid AoV and Asc Ao dissection-->39mm SJM mech AVR; b. 03/2020 Echo: EF 55-60%. No rwma. Mild LVH. Nl RV fxn. Nl functioning AVR.   Hyperlipidemia    Hypertension    Pericardial effusion    a. 08/2018 post-op -->s/p window.    Patient Active Problem List   Diagnosis Date Noted   Esophagitis, erosive 05/26/2022   Chronic GERD 05/26/2022   OSA (obstructive  sleep apnea) 04/15/2022   Plaque psoriasis 04/14/2022   Mixed hyperlipidemia 09/09/2021   Onychodystrophy 06/25/2021   Neoplasm of uncertain behavior of skin 06/25/2021   Generalized anxiety disorder 06/25/2021   Abnormal cardiac CT angiography    Coronary artery disease involving native coronary artery of native heart    Warfarin anticoagulation 03/15/2019   Encounter for therapeutic drug monitoring 09/29/2018   Aortic valve prosthesis present 09/29/2018   S/P ascending aortic aneurysm repair 09/19/2018   Allergic rhinitis 09/06/2018   Hepatic steatosis 10/30/2016   Insomnia 11/29/2015   Migraine 11/29/2015    Past Surgical History:  Procedure Laterality Date   BENTALL PROCEDURE N/A 09/19/2018   Procedure: BENTALL PROCEDURE WITH 23 ST. JUDE GRAFT CONDUIT.;  Surgeon: Alleen Borne, MD;  Location: [facility] OR;  Service: Open Heart Surgery;  Laterality: N/A;   CARDIAC SURGERY     CLEFT LIP REPAIR     CORONARY ANGIOGRAPHY Left 06/13/2021   Procedure: CORONARY ANGIOGRAPHY (CATH LAB);  Surgeon: Iran Ouch, MD;  Location: [facility] INVASIVE CV LAB;  Service: Cardiovascular;  Laterality: Left;   CORONARY PRESSURE/FFR WITH 3D MAPPING N/A 06/13/2021   Procedure: Coronary Pressure Wire, IFR;  Surgeon: Iran Ouch, MD;  Location: [facility] INVASIVE CV LAB;  Service: Cardiovascular;  Laterality: N/A;   ESOPHAGOGASTRODUODENOSCOPY (EGD) WITH PROPOFOL N/A 05/26/2022   Procedure: ESOPHAGOGASTRODUODENOSCOPY (EGD) WITH PROPOFOL;  Surgeon: Toney Reil, MD;  Location: [facility] ENDOSCOPY;  Service: Gastroenterology;  Laterality: N/A;   SUBXYPHOID PERICARDIAL WINDOW N/A 09/26/2018   Procedure: SUBXYPHOID PERICARDIAL WINDOW;  Surgeon: Alleen Borne, MD;  Location: [facility] OR;  Service: Thoracic;  Laterality: N/A;   TEE WITHOUT CARDIOVERSION N/A 09/19/2018   Procedure: TRANSESOPHAGEAL ECHOCARDIOGRAM (TEE);  Surgeon: Alleen Borne, MD;  [facility];  Service: Open Heart Surgery;  Laterality: N/A;    WISDOM TOOTH EXTRACTION         Home Medications    Prior to Admission medications   Medication Sig Start Date End Date Taking? Authorizing Provider  ALPRAZolam Prudy Feeler) 1 MG tablet Take 1 tablet (1 mg total) by mouth at bedtime as needed for anxiety. 03/22/23  Yes Reubin Milan, MD  amoxicillin-clavulanate (AUGMENTIN) 875-125 MG tablet Take 1 tablet by mouth 2 (two) times daily. 04/30/23  Yes Cheron Schaumann K, PA-C  atorvastatin (LIPITOR) 80 MG tablet Take 1 tablet (80 mg total) by mouth daily. 04/07/23 04/01/24 Yes Agbor-Etang, Arlys John, MD  Bempedoic Acid-Ezetimibe (NEXLIZET) 180-10 MG TABS Take 1 tablet by mouth daily. 01/13/23  Yes Agbor-Etang, Arlys John, MD  bimekizumab-bkzx [facility]) 160 MG/ML pen Inject into the skin every 30 (thirty) days.   Yes [provider]  busPIRone (BUSPAR) 5 MG tablet TAKE ONE TABLET BY MOUTH TWICE A DAY 03/07/23  Yes Reubin Milan, MD  fenofibrate (TRICOR) 145 MG tablet Take 1 tablet (145 mg total) by mouth daily. 02/04/23  Yes Debbe Odea, MD  isosorbide mononitrate (IMDUR) 30 MG 24 hr tablet Take 1 tablet (30 mg total) by mouth in the morning and at bedtime. 04/05/23 07/04/23 Yes Agbor-Etang, Arlys John, MD  methocarbamol (ROBAXIN) 500 MG tablet Take 1 tablet (500 mg total) by mouth at bedtime. 02/25/22  Yes Reubin Milan, MD  metoprolol succinate (TOPROL-XL) 50 MG 24 hr tablet Take 1 1/2 tabs (75mg ) by mouth twice a day 01/22/23  Yes Agbor-Etang, Arlys John, MD  omeprazole (PRILOSEC) 40 MG capsule Take 1 capsule (40 mg total) by mouth daily. 02/10/23 02/05/24 Yes Celso Amy, PA-C  Vitamin D, Ergocalciferol, (DRISDOL) 1.25 MG (50000 UNIT) CAPS capsule Take 50,000 Units by mouth once a week. 01/28/23  Yes [provider]  warfarin (COUMADIN) 5 MG tablet Take 1 tablet to 1.5 tablets by mouth daily or as directed by the Anticoagulation Clinic. 04/06/23  Yes Agbor-Etang, Arlys John, MD  cetirizine (ZYRTEC) 10 MG tablet Take 10 mg by mouth daily as needed  for allergies.    [provider]  clobetasol cream (TEMOVATE) 0.05 % Apply 1 application topically 2 (two) times daily. To external ear canals 09/03/20   Reubin Milan, MD  ondansetron (ZOFRAN-ODT) 4 MG disintegrating tablet Take 1 tablet (4 mg total) by mouth every 8 (eight) hours as needed. 11/23/22   Shaune Pollack, MD  ZORYVE 0.3 % CREA SMARTSIG:sparingly Topical Daily Patient not taking: Reported on 04/30/2023 01/08/22   [provider]    Family History Family History  Problem Relation Age of Onset   Heart attack Mother    Anxiety disorder Father    Diabetes Father    Depression Brother    Anxiety disorder Brother     Social History Social History   Tobacco Use   Smoking status: Never   Smokeless tobacco: Never  Vaping Use   Vaping status: Former  Substance Use Topics   Alcohol use: Not Currently   Drug use: Never     Allergies   Patient has no known allergies.   Review  of Systems Review of Systems  Respiratory:  Positive for cough.   All other systems reviewed and are negative.    Physical Exam Triage Vital Signs ED Triage Vitals [04/30/23 1534]  Encounter Vitals Group     BP 114/76     Systolic BP Percentile      Diastolic BP Percentile      Pulse Rate 76     Resp 18     Temp 97.8 F (36.6 C)     Temp Source Oral     SpO2 95 %     Weight      Height      Head Circumference      Peak Flow      Pain Score 0     Pain Loc      Pain Education      Exclude from Growth Chart    No data found.  Updated Vital Signs BP 114/76 (BP Location: Left Arm)   Pulse 76   Temp 97.8 F (36.6 C) (Oral)   Resp 18   SpO2 95%   Visual Acuity Right Eye Distance:   Left Eye Distance:   Bilateral Distance:    Right Eye Near:   Left Eye Near:    Bilateral Near:     Physical Exam Vitals and nursing note reviewed.  Constitutional:      Appearance: He is well-developed.  HENT:     Head: Normocephalic.     Mouth/Throat:     Mouth:  Mucous membranes are moist.  Eyes:     Pupils: Pupils are equal, round, and reactive to light.  Cardiovascular:     Rate and Rhythm: Normal rate.  Pulmonary:     Effort: Pulmonary effort is normal.  Abdominal:     General: There is no distension.  Musculoskeletal:        General: Normal range of motion.     Cervical back: Normal range of motion.  Skin:    General: Skin is warm.  Neurological:     General: No focal deficit present.     Mental Status: He is alert and oriented to person, place, and time.    Tender bilateral maxillary sinuses.  UC Treatments / Results  Labs (all labs ordered are listed, but only abnormal results are displayed) Labs Reviewed - No data to display  EKG   Radiology No results found.  Procedures Procedures (including critical care time)  Medications Ordered in UC Medications - No data to display  Initial Impression / Assessment and Plan / UC Course  I have reviewed the triage vital signs and the nursing notes.  Pertinent labs & imaging results that were available during my care of the patient were reviewed by me and considered in my medical decision making (see chart for details).     Suspect patient has a sinus infection.  His lungs are clear.  I advised patient if he starts experiencing more chest symptoms he should return for a chest x-ray.  Patient is given a prescription for Augmentin.  He is advised to follow-up with his primary care physician for recheck. Final Clinical Impressions(s) / UC Diagnoses   Final diagnoses:  Acute ethmoidal sinusitis, recurrence not specified     Discharge Instructions      Return for chest xray if symptoms persist after antibiotic treatment. .     ED Prescriptions     Medication Sig Dispense Auth. Provider   amoxicillin-clavulanate (AUGMENTIN) 875-125 MG tablet Take 1 tablet by  mouth 2 (two) times daily. 20 tablet Elson Areas, [place]      An After Visit Summary was printed and given to the  patient.     PDMP not reviewed this encounter.   Elson Areas, [place] 04/30/23 1606

## 2023-04-30 NOTE — Telephone Encounter (Signed)
I spoke with patient who was started on Augmentin and advised him to decrease Warfarin to 1 tablet daily until I see him 12/11.  He verbalized understanding

## 2023-04-30 NOTE — Discharge Instructions (Addendum)
Return for chest xray if symptoms persist after antibiotic treatment. Marland Kitchen

## 2023-05-04 ENCOUNTER — Ambulatory Visit: Payer: PRIVATE HEALTH INSURANCE | Admitting: Adult Health

## 2023-05-05 ENCOUNTER — Ambulatory Visit: Payer: PRIVATE HEALTH INSURANCE | Attending: Cardiology

## 2023-05-05 DIAGNOSIS — Z952 Presence of prosthetic heart valve: Secondary | ICD-10-CM | POA: Diagnosis not present

## 2023-05-05 DIAGNOSIS — Z5181 Encounter for therapeutic drug level monitoring: Secondary | ICD-10-CM

## 2023-05-05 LAB — POCT INR: INR: 2.1 (ref 2.0–3.0)

## 2023-05-05 NOTE — Patient Instructions (Signed)
Continue warfarin 1.5 tablets daily except 1 tablet Mondays and Fridays.  TAKE 1 TABLET on SATURDAY and SUNDAY while on Augmentin  Recheck INR in 3 weeks  [phone]

## 2023-05-12 ENCOUNTER — Encounter: Payer: Self-pay | Admitting: Internal Medicine

## 2023-05-25 ENCOUNTER — Ambulatory Visit: Payer: PRIVATE HEALTH INSURANCE | Attending: Cardiology

## 2023-05-25 DIAGNOSIS — Z5181 Encounter for therapeutic drug level monitoring: Secondary | ICD-10-CM | POA: Diagnosis not present

## 2023-05-25 DIAGNOSIS — Z952 Presence of prosthetic heart valve: Secondary | ICD-10-CM | POA: Diagnosis not present

## 2023-05-25 LAB — POCT INR: INR: 4 — AB (ref 2.0–3.0)

## 2023-05-25 NOTE — Patient Instructions (Signed)
 HOLD TODAY ONLY THEN Continue warfarin 1.5 tablets daily except 1 tablet Mondays and Fridays.    Recheck INR in 4 weeks  [phone]

## 2023-06-01 ENCOUNTER — Other Ambulatory Visit: Payer: Self-pay

## 2023-06-01 ENCOUNTER — Encounter: Payer: Self-pay | Admitting: Cardiology

## 2023-06-01 MED ORDER — FENOFIBRATE 145 MG PO TABS: 145.0000 mg | ORAL_TABLET | Freq: Every day | ORAL | 0 refills | Status: DC

## 2023-06-01 NOTE — Telephone Encounter (Signed)
 Last office visit: 01/11/23 with plan to f/u in 6 months. next office visit: none/active recall  Please schedule f/u appt.  Thanks!

## 2023-06-01 NOTE — Telephone Encounter (Signed)
 Requested Prescriptions   Signed Prescriptions Disp Refills   fenofibrate  (TRICOR ) 145 MG tablet 90 tablet 0    Sig: Take 1 tablet (145 mg total) by mouth daily. PLEASE CALL OFFICE TO SCHEDULE APPOINTMENT PRIOR TO NEXT REFILL    Authorizing Provider: DARLISS ROGUE    Ordering User: CLAUDENE POWELL CROME

## 2023-06-03 NOTE — Telephone Encounter (Signed)
 Left voicemail for return call to schedule follow up appointment

## 2023-06-07 NOTE — Telephone Encounter (Signed)
 Appointment scheduled for 07/16/23

## 2023-06-09 ENCOUNTER — Ambulatory Visit: Payer: PRIVATE HEALTH INSURANCE | Admitting: Internal Medicine

## 2023-06-13 ENCOUNTER — Emergency Department: Payer: PRIVATE HEALTH INSURANCE

## 2023-06-13 ENCOUNTER — Emergency Department
Admission: EM | Admit: 2023-06-13 | Discharge: 2023-06-13 | Disposition: A | Discharge status: Home / Self Care | Payer: PRIVATE HEALTH INSURANCE | Attending: Emergency Medicine | Admitting: Emergency Medicine

## 2023-06-13 ENCOUNTER — Other Ambulatory Visit: Payer: Self-pay

## 2023-06-13 DIAGNOSIS — I10 Essential (primary) hypertension: Secondary | ICD-10-CM | POA: Insufficient documentation

## 2023-06-13 DIAGNOSIS — W01198A Fall on same level from slipping, tripping and stumbling with subsequent striking against other object, initial encounter: Secondary | ICD-10-CM | POA: Diagnosis not present

## 2023-06-13 DIAGNOSIS — S20212A Contusion of left front wall of thorax, initial encounter: Secondary | ICD-10-CM | POA: Insufficient documentation

## 2023-06-13 DIAGNOSIS — I2510 Atherosclerotic heart disease of native coronary artery without angina pectoris: Secondary | ICD-10-CM | POA: Diagnosis not present

## 2023-06-13 DIAGNOSIS — S29001A Unspecified injury of muscle and tendon of front wall of thorax, initial encounter: Secondary | ICD-10-CM | POA: Diagnosis present

## 2023-06-13 MED ORDER — OXYCODONE HCL 5 MG PO TABS: 5.0000 mg | ORAL_TABLET | Freq: Four times a day (QID) | ORAL | 0 refills | Status: DC | PRN

## 2023-06-13 MED ORDER — OXYCODONE-ACETAMINOPHEN 5-325 MG PO TABS
1.0000 | ORAL_TABLET | Freq: Once | ORAL | Status: AC
  Administered 2023-06-13: 1 via ORAL
  Filled 2023-06-13: qty 1

## 2023-06-13 NOTE — Discharge Instructions (Signed)
Follow-up with your primary care provider if any continued problems or concerns.  A prescription for oxycodone was sent to the pharmacy for you to take every 6 hours as needed for pain.  Do not drive or operate machinery while taking this medication as it could cause drowsiness.  You may also take Tylenol with this medication if additional pain medication is needed.

## 2023-06-13 NOTE — ED Triage Notes (Signed)
Pt comes with c/o left side rib pain after tripping and falling last night. Pt denies any loc or hitting head.

## 2023-06-13 NOTE — ED Provider Notes (Signed)
[facility] Provider Note    Event Date/Time   First MD Initiated Contact with Patient 06/13/23 1019     (approximate)   History   Fall   HPI  Henry Duncan is a 33 y.o. male   presents to the ED with complaint of left sided rib pain after tripping and falling hitting steps with left side of his torso last evening.  Patient denies any head injury or loss of consciousness.  He states he was carrying his infant in his right arm and tripped on his pants leg.  Patient has history of hypertension, aortic aneurysm, CAD, mechanical valve replacement, migraine and long-term anticoagulant therapy.      Physical Exam   Triage Vital Signs: ED Triage Vitals [06/13/23 0950]  Encounter Vitals Group     BP (!) 122/104     Systolic BP Percentile      Diastolic BP Percentile      Pulse Rate (!) 106     Resp 18     Temp 97.7 F (36.5 C)     Temp src      SpO2 100 %     Weight 185 lb (83.9 kg)     Height 5\' 7"  (1.702 m)     Head Circumference      Peak Flow      Pain Score 7     Pain Loc      Pain Education      Exclude from Growth Chart     Most recent vital signs: Vitals:   06/13/23 0950 06/13/23 1015  BP: (!) 122/104   Pulse: (!) 106   Resp: 18   Temp: 97.7 F (36.5 C)   SpO2: 100% 98%     General: Awake, no distress.  CV:  Good peripheral perfusion.  Heart regular rate and rhythm with a mechanical click noted. Resp:  Normal effort.  Lungs are clear bilaterally. Abd:  No distention.  Other:     ED Results / Procedures / Treatments   Labs (all labs ordered are listed, but only abnormal results are displayed) Labs Reviewed - No data to display     RADIOLOGY Left rib detail with 1 view chest images were reviewed by myself independent of the radiologist with no obvious fracture noted.  Radiology report is negative.    PROCEDURES:  Critical Care performed:   Procedures   MEDICATIONS ORDERED IN ED: Medications   oxyCODONE-acetaminophen (PERCOCET/ROXICET) 5-325 MG per tablet 1 tablet (1 tablet Oral Given 06/13/23 1048)     IMPRESSION / MDM / ASSESSMENT AND PLAN / ED COURSE  I reviewed the triage vital signs and the nursing notes.   Differential diagnosis includes, but is not limited to, contusion left ribs, fracture, musculoskeletal strain secondary to fall.  33 year old male presents to the ED with complaint of left sided rib pain after falling on the steps in his home while carrying his infant.  Patient denied any head injury but has continued to have left sided rib pain especially with deep inspiration.  X-rays were reassuring and patient was made aware.  He was given Percocet 5 mg while in the emergency department.  A prescription for oxycodone was sent to the pharmacy for him to begin taking with additional Tylenol if needed.  Patient is aware that he cannot drive or operate machinery while taking this medication.  He is to follow-up with his PCP if any continued problems.      Patient's presentation is  most consistent with acute complicated illness / injury requiring diagnostic workup.  FINAL CLINICAL IMPRESSION(S) / ED DIAGNOSES   Final diagnoses:  Contusion of ribs, left, initial encounter     Rx / DC Orders   ED Discharge Orders          Ordered    oxyCODONE (OXY IR/ROXICODONE) 5 MG immediate release tablet  Every 6 hours PRN        06/13/23 1056             Note:  This document was prepared using Dragon voice recognition software and may include unintentional dictation errors.   Tommi Rumps, PA-C 06/13/23 1107    Minna Antis, MD 06/13/23 1414

## 2023-06-18 ENCOUNTER — Other Ambulatory Visit: Payer: Self-pay | Admitting: Internal Medicine

## 2023-06-18 DIAGNOSIS — F411 Generalized anxiety disorder: Secondary | ICD-10-CM

## 2023-06-18 DIAGNOSIS — F5101 Primary insomnia: Secondary | ICD-10-CM

## 2023-06-18 NOTE — Telephone Encounter (Signed)
Requested medication (s) are due for refill today: no  Requested medication (s) are on the active medication list: no, dose increased   Last refill:  03/02/23  Future visit scheduled: yes  Notes to clinic:  not delegated, The original prescription was discontinued on 03/22/2023 by Reubin Milan, MD. Renewing this prescription may not be appropriate.      Requested Prescriptions  Pending Prescriptions Disp Refills   ALPRAZolam (XANAX) 0.5 MG tablet [Pharmacy Med Name: ALPRAZOLAM 0.5 MG TAB] 15 tablet 5    Sig: TAKE ONE TABLET BY MOUTH TWICE A DAY AS NEEDED FOR ANXIETY     Not Delegated - Psychiatry: Anxiolytics/Hypnotics 2 Failed - 06/18/2023  3:27 PM      Failed - This refill cannot be delegated      Failed - Urine Drug Screen completed in last 360 days      Passed - Patient is not pregnant      Passed - Valid encounter within last 6 months    Recent Outpatient Visits           3 months ago Generalized anxiety disorder   [place] [facility] at [facility], Nyoka Cowden, MD   8 months ago Annual physical exam   [facility] at [facility], Nyoka Cowden, MD   1 year ago Acute left-sided thoracic back pain   [place] [facility] at [facility], Nyoka Cowden, MD   1 year ago Generalized anxiety disorder   [facility] at [facility], Nyoka Cowden, MD   1 year ago Annual physical exam   [facility] at [facility], Nyoka Cowden, MD       Future Appointments             In 4 weeks Agbor-Etang, Arlys John, MD [facility] at [place]   In 3 months Judithann Graves, Nyoka Cowden, MD [facility] Medicine at [facility], [facility]

## 2023-06-23 ENCOUNTER — Ambulatory Visit: Payer: PRIVATE HEALTH INSURANCE

## 2023-06-30 ENCOUNTER — Ambulatory Visit: Payer: PRIVATE HEALTH INSURANCE

## 2023-07-01 ENCOUNTER — Other Ambulatory Visit: Payer: Self-pay | Admitting: *Deleted

## 2023-07-01 ENCOUNTER — Other Ambulatory Visit (HOSPITAL_COMMUNITY): Payer: Self-pay

## 2023-07-01 DIAGNOSIS — Z5181 Encounter for therapeutic drug level monitoring: Secondary | ICD-10-CM

## 2023-07-01 DIAGNOSIS — Z7901 Long term (current) use of anticoagulants: Secondary | ICD-10-CM

## 2023-07-01 DIAGNOSIS — I2510 Atherosclerotic heart disease of native coronary artery without angina pectoris: Secondary | ICD-10-CM

## 2023-07-01 MED ORDER — WARFARIN SODIUM 5 MG PO TABS
ORAL_TABLET | ORAL | 0 refills | Status: DC
  Filled 2023-07-01: qty 50, 30d supply, fill #0

## 2023-07-01 MED ORDER — ISOSORBIDE MONONITRATE ER 30 MG PO TB24: 30.0000 mg | ORAL_TABLET | Freq: Two times a day (BID) | ORAL | 0 refills | Status: DC

## 2023-07-01 MED ORDER — WARFARIN SODIUM 5 MG PO TABS: ORAL_TABLET | ORAL | 0 refills | Status: DC

## 2023-07-01 NOTE — Addendum Note (Signed)
 Addended by: Karis Outhouse on: 07/01/2023 09:27 AM   Modules accepted: Orders

## 2023-07-01 NOTE — Telephone Encounter (Signed)
 Prescription refill request received for warfarin Lov: [place], 01/11/2023 Next INR check: 06/23/2023 Warfarin tablet strength: 5mg    Pt scheduled to come in on 07/06/2022

## 2023-07-05 ENCOUNTER — Other Ambulatory Visit: Payer: Self-pay

## 2023-07-05 DIAGNOSIS — Z7901 Long term (current) use of anticoagulants: Secondary | ICD-10-CM

## 2023-07-05 DIAGNOSIS — Z5181 Encounter for therapeutic drug level monitoring: Secondary | ICD-10-CM

## 2023-07-05 MED ORDER — WARFARIN SODIUM 5 MG PO TABS: ORAL_TABLET | ORAL | 0 refills | Status: DC

## 2023-07-05 NOTE — Telephone Encounter (Signed)
 Prescription refill request received for warfarin Lov: 01/11/23 [place])  Next INR check: 06/23/23 Warfarin tablet strength: 5mg   Anticoagulation clinic appt overdue. Pt has scheduled appt on 07/07/23 with Anticoagulation clinic. Refill sent for 30 day supply to prevent any  misses doses.

## 2023-07-07 ENCOUNTER — Ambulatory Visit: Payer: PRIVATE HEALTH INSURANCE | Attending: Cardiology

## 2023-07-07 ENCOUNTER — Encounter: Payer: Self-pay | Admitting: Cardiology

## 2023-07-07 DIAGNOSIS — Z952 Presence of prosthetic heart valve: Secondary | ICD-10-CM

## 2023-07-07 DIAGNOSIS — Z5181 Encounter for therapeutic drug level monitoring: Secondary | ICD-10-CM | POA: Diagnosis not present

## 2023-07-07 LAB — POCT INR: INR: 5.7 — AB (ref 2.0–3.0)

## 2023-07-07 NOTE — Patient Instructions (Signed)
HOLD TODAY and Thursday ONLY THEN Continue warfarin 1.5 tablets daily except 1 tablet Mondays and Fridays.    Recheck INR in 3 weeks  [phone]

## 2023-07-08 ENCOUNTER — Other Ambulatory Visit: Payer: Self-pay

## 2023-07-08 MED ORDER — NEXLIZET 180-10 MG PO TABS: 1.0000 | ORAL_TABLET | Freq: Every day | ORAL | 1 refills | Status: DC

## 2023-07-08 NOTE — Telephone Encounter (Signed)
Last visit: 01/11/23 with follow up plan 6 months  next visit: 2/21 Requested Prescriptions   Signed Prescriptions Disp Refills   Bempedoic Acid-Ezetimibe (NEXLIZET) 180-10 MG TABS 30 tablet 1    Sig: Take 1 tablet by mouth daily.    Authorizing Provider: Debbe Odea    Ordering User: Guerry Minors

## 2023-07-16 ENCOUNTER — Ambulatory Visit: Payer: PRIVATE HEALTH INSURANCE | Admitting: Cardiology

## 2023-07-26 ENCOUNTER — Ambulatory Visit: Payer: PRIVATE HEALTH INSURANCE | Admitting: Internal Medicine

## 2023-07-27 ENCOUNTER — Other Ambulatory Visit: Payer: Self-pay | Admitting: Physician Assistant

## 2023-07-27 DIAGNOSIS — F411 Generalized anxiety disorder: Secondary | ICD-10-CM

## 2023-07-27 NOTE — Telephone Encounter (Signed)
 Requested medication (s) are due for refill today: Yes  Requested medication (s) are on the active medication list: Yes  Last refill:  06/18/23  Future visit scheduled: Yes  Notes to clinic:  Unable to refill per protocol, cannot delegate.      Requested Prescriptions  Pending Prescriptions Disp Refills   ALPRAZolam (XANAX) 0.5 MG tablet [Pharmacy Med Name: ALPRAZOLAM 0.5 MG TAB] 15 tablet 1    Sig: TAKE ONE TABLET BY MOUTH TWICE A DAY AS NEEDED FOR ANXIETY     Not Delegated - Psychiatry: Anxiolytics/Hypnotics 2 Failed - 07/27/2023  5:28 PM      Failed - This refill cannot be delegated      Failed - Urine Drug Screen completed in last 360 days      Passed - Patient is not pregnant      Passed - Valid encounter within last 6 months    Recent Outpatient Visits           4 months ago Generalized anxiety disorder   [place] [facility] at [facility], Nyoka Cowden, MD   9 months ago Annual physical exam   [facility] at [facility], Nyoka Cowden, MD   1 year ago Acute left-sided thoracic back pain   [place] [facility] at [facility], Nyoka Cowden, MD   1 year ago Generalized anxiety disorder   [facility] at [facility], Nyoka Cowden, MD   1 year ago Annual physical exam   [facility] at [facility], Nyoka Cowden, MD       Future Appointments             In 3 weeks Agbor-Etang, Arlys John, MD [facility] at [place]   In 2 months Judithann Graves, Nyoka Cowden, MD [facility] Medicine at [facility]

## 2023-07-28 ENCOUNTER — Other Ambulatory Visit: Payer: Self-pay | Admitting: Internal Medicine

## 2023-07-28 ENCOUNTER — Ambulatory Visit: Payer: PRIVATE HEALTH INSURANCE | Attending: Cardiology

## 2023-07-28 DIAGNOSIS — Z5181 Encounter for therapeutic drug level monitoring: Secondary | ICD-10-CM

## 2023-07-28 DIAGNOSIS — Z952 Presence of prosthetic heart valve: Secondary | ICD-10-CM | POA: Diagnosis not present

## 2023-07-28 LAB — POCT INR: INR: 2.3 (ref 2.0–3.0)

## 2023-07-28 NOTE — Patient Instructions (Signed)
 Take 2 tablets today only then Continue warfarin 1.5 tablets daily except 1 tablet Mondays and Fridays.    Recheck INR in 4 weeks  [phone]

## 2023-07-28 NOTE — Progress Notes (Unsigned)
 Date:  07/28/2023   Name:  Henry Duncan   DOB:  [birth year]   MRN:  [id number]   Chief Complaint: No chief complaint on file.  HPI  Review of Systems   Lab Results  Component Value Date   NA 136 11/23/2022   K 3.9 11/23/2022   CO2 23 11/23/2022   GLUCOSE 120 (H) 11/23/2022   BUN 14 11/23/2022   CREATININE 0.71 11/23/2022   CALCIUM 9.0 11/23/2022   EGFR 124 10/05/2022   GFRNONAA >60 11/23/2022   Lab Results  Component Value Date   CHOL 123 09/11/2022   HDL 36 (L) 09/11/2022   LDLCALC 52 09/11/2022   TRIG 175 (H) 09/11/2022   CHOLHDL 3.4 09/11/2022   Lab Results  Component Value Date   TSH 0.235 (L) 10/05/2022   Lab Results  Component Value Date   HGBA1C 5.8 (H) 10/05/2022   Lab Results  Component Value Date   WBC 7.7 11/23/2022   HGB 14.5 11/23/2022   HCT 43.7 11/23/2022   MCV 89.9 11/23/2022   PLT 216 11/23/2022   Lab Results  Component Value Date   ALT 61 (H) 11/23/2022   AST 64 (H) 11/23/2022   ALKPHOS 44 11/23/2022   BILITOT 0.8 11/23/2022   No results found for: "25OHVITD2", "25OHVITD3", "VD25OH"   Patient Active Problem List   Diagnosis Date Noted   Esophagitis, erosive 05/26/2022   Chronic GERD 05/26/2022   OSA (obstructive sleep apnea) 04/15/2022   Plaque psoriasis 04/14/2022   Mixed hyperlipidemia 09/09/2021   Onychodystrophy 06/25/2021   Neoplasm of uncertain behavior of skin 06/25/2021   Generalized anxiety disorder 06/25/2021   Abnormal cardiac CT angiography    Coronary artery disease involving native coronary artery of native heart    Warfarin anticoagulation 03/15/2019   Encounter for therapeutic drug monitoring 09/29/2018   Aortic valve prosthesis present 09/29/2018   S/P ascending aortic aneurysm repair 09/19/2018   Allergic rhinitis 09/06/2018   Hepatic steatosis 10/30/2016   Insomnia 11/29/2015   Migraine 11/29/2015    No Known Allergies  Past Surgical History:  Procedure Laterality Date   BENTALL PROCEDURE  N/A 09/19/2018   Procedure: BENTALL PROCEDURE WITH 23 ST. JUDE GRAFT CONDUIT.;  Surgeon: Alleen Borne, MD;  Location: [facility] OR;  Service: Open Heart Surgery;  Laterality: N/A;   CARDIAC SURGERY     CLEFT LIP REPAIR     CORONARY ANGIOGRAPHY Left 06/13/2021   Procedure: CORONARY ANGIOGRAPHY (CATH LAB);  Surgeon: Iran Ouch, MD;  Location: [facility] INVASIVE CV LAB;  Service: Cardiovascular;  Laterality: Left;   CORONARY PRESSURE/FFR WITH 3D MAPPING N/A 06/13/2021   Procedure: Coronary Pressure Wire, IFR;  Surgeon: Iran Ouch, MD;  Location: [facility] INVASIVE CV LAB;  Service: Cardiovascular;  Laterality: N/A;   ESOPHAGOGASTRODUODENOSCOPY (EGD) WITH PROPOFOL N/A 05/26/2022   Procedure: ESOPHAGOGASTRODUODENOSCOPY (EGD) WITH PROPOFOL;  Surgeon: Toney Reil, MD;  Location: [facility] ENDOSCOPY;  Service: Gastroenterology;  Laterality: N/A;   SUBXYPHOID PERICARDIAL WINDOW N/A 09/26/2018   Procedure: SUBXYPHOID PERICARDIAL WINDOW;  Surgeon: Alleen Borne, MD;  Location: [facility] OR;  Service: Thoracic;  Laterality: N/A;   TEE WITHOUT CARDIOVERSION N/A 09/19/2018   Procedure: TRANSESOPHAGEAL ECHOCARDIOGRAM (TEE);  Surgeon: Alleen Borne, MD;  Location[facility];  Service: Open Heart Surgery;  Laterality: N/A;   WISDOM TOOTH EXTRACTION      Social History   Tobacco Use   Smoking status: Never   Smokeless tobacco: Never  Vaping Use   Vaping  status: Former  Substance Use Topics   Alcohol use: Not Currently   Drug use: Never     Medication list has been reviewed and updated.  No outpatient medications have been marked as taking for the 07/28/23 encounter (Orders Only) with Reubin Milan, MD.       03/02/2023    3:48 PM 10/05/2022    8:07 AM 02/25/2022    3:57 PM 01/06/2022   11:27 AM  GAD 7 : Generalized Anxiety Score  Nervous, Anxious, on Edge 2 0 1 1  Control/stop worrying 1 0 0 1  Worry too much - different things 1 0 0 2  Trouble relaxing 1 0 0 1  Restless 0 0 0 1  Easily annoyed  or irritable 0 0 0 1  Afraid - awful might happen 1 0 0 2  Total GAD 7 Score 6 0 1 9  Anxiety Difficulty Somewhat difficult Not difficult at all Not difficult at all Somewhat difficult       03/02/2023    3:48 PM 10/05/2022    8:07 AM 02/25/2022    3:56 PM  Depression screen PHQ 2/9  Decreased Interest 0 0 0  Down, Depressed, Hopeless 0 0 0  PHQ - 2 Score 0 0 0  Altered sleeping 1 2 0  Tired, decreased energy 1 1 0  Change in appetite 0 0 0  Feeling bad or failure about yourself  0 0 0  Trouble concentrating 0 0 0  Moving slowly or fidgety/restless 0 0 0  Suicidal thoughts 0 0 0  PHQ-9 Score 2 3 0  Difficult doing work/chores Not difficult at all Not difficult at all Not difficult at all    BP Readings from Last 3 Encounters:  06/13/23 (!) 122/104  04/30/23 114/76  03/02/23 90/60    Physical Exam  Wt Readings from Last 3 Encounters:  06/13/23 185 lb (83.9 kg)  03/02/23 174 lb 6.4 oz (79.1 kg)  02/10/23 171 lb 12.8 oz (77.9 kg)    There were no vitals taken for this visit.  Assessment and Plan:  Problem List Items Addressed This Visit   None   No follow-ups on file.    Reubin Milan, MD [facility] Medicine [place]

## 2023-07-28 NOTE — Telephone Encounter (Signed)
 Please review.  KP

## 2023-07-30 ENCOUNTER — Telehealth: Payer: Self-pay | Admitting: Cardiology

## 2023-07-30 MED ORDER — NEXLIZET 180-10 MG PO TABS: 1.0000 | ORAL_TABLET | Freq: Every day | ORAL | 3 refills | Status: DC

## 2023-07-30 MED ORDER — NEXLIZET 180-10 MG PO TABS: 1.0000 | ORAL_TABLET | Freq: Every day | ORAL | 0 refills | Status: DC

## 2023-07-30 NOTE — Addendum Note (Signed)
 Addended by: Malena Peer D on: 07/30/2023 03:58 PM   Modules accepted: Orders

## 2023-07-30 NOTE — Telephone Encounter (Signed)
 Refill request for Nexlizet

## 2023-07-30 NOTE — Telephone Encounter (Signed)
*  STAT* If patient is at the pharmacy, call can be transferred to refill team.   1. Which medications need to be refilled? (please list name of each medication and dose if known) Bempedoic Acid-Ezetimibe (NEXLIZET) 180-10 MG TABS   2. Which pharmacy/location (including street and city if local pharmacy) is medication to be sent to? [facility] [place] [place] - [address] Phone: [phone]  Fax: [phone]     3. Do they need a 30 day or 90 day supply? 90

## 2023-08-03 ENCOUNTER — Ambulatory Visit (INDEPENDENT_AMBULATORY_CARE_PROVIDER_SITE_OTHER): Payer: PRIVATE HEALTH INSURANCE | Admitting: Internal Medicine

## 2023-08-03 VITALS — BP 128/72 | HR 79 | Ht 67.0 in | Wt 177.1 lb

## 2023-08-03 DIAGNOSIS — F411 Generalized anxiety disorder: Secondary | ICD-10-CM | POA: Diagnosis not present

## 2023-08-03 DIAGNOSIS — F5101 Primary insomnia: Secondary | ICD-10-CM

## 2023-08-03 MED ORDER — TRAZODONE HCL 50 MG PO TABS: 25.0000 mg | ORAL_TABLET | Freq: Every evening | ORAL | 3 refills | Status: DC | PRN

## 2023-08-03 NOTE — Assessment & Plan Note (Signed)
 Currently on Buspar and PRN Xanax Will continue current regimen and follow up as needed

## 2023-08-03 NOTE — Progress Notes (Signed)
 Date:  08/03/2023   Name:  Henry Duncan   DOB:  May 29, 1990   MRN:  [id number]   Chief Complaint: Insomnia and Anxiety  Insomnia Primary symptoms: sleep disturbance, difficulty falling asleep, frequent awakening.   The problem occurs nightly. The problem has been gradually worsening since onset. The symptoms are aggravated by anxiety.  Anxiety Presents for follow-up visit. Symptoms include excessive worry, insomnia and nervous/anxious behavior. Patient reports no dizziness or shortness of breath. Symptoms occur constantly.      Review of Systems  Constitutional:  Negative for chills, fatigue and fever.  Respiratory:  Negative for chest tightness and shortness of breath.   Musculoskeletal:  Negative for arthralgias.  Neurological:  Negative for dizziness and headaches.  Psychiatric/Behavioral:  Positive for sleep disturbance. Negative for dysphoric mood. The patient is nervous/anxious and has insomnia.      Lab Results  Component Value Date   NA 136 11/23/2022   K 3.9 11/23/2022   CO2 23 11/23/2022   GLUCOSE 120 (H) 11/23/2022   BUN 14 11/23/2022   CREATININE 0.71 11/23/2022   CALCIUM 9.0 11/23/2022   EGFR 124 10/05/2022   GFRNONAA >60 11/23/2022   Lab Results  Component Value Date   CHOL 123 09/11/2022   HDL 36 (L) 09/11/2022   LDLCALC 52 09/11/2022   TRIG 175 (H) 09/11/2022   CHOLHDL 3.4 09/11/2022   Lab Results  Component Value Date   TSH 0.235 (L) 10/05/2022   Lab Results  Component Value Date   HGBA1C 5.8 (H) 10/05/2022   Lab Results  Component Value Date   WBC 7.7 11/23/2022   HGB 14.5 11/23/2022   HCT 43.7 11/23/2022   MCV 89.9 11/23/2022   PLT 216 11/23/2022   Lab Results  Component Value Date   ALT 61 (H) 11/23/2022   AST 64 (H) 11/23/2022   ALKPHOS 44 11/23/2022   BILITOT 0.8 11/23/2022   No results found for: "25OHVITD2", "25OHVITD3", "VD25OH"   Patient Active Problem List   Diagnosis Date Noted   Esophagitis, erosive  05/26/2022   Chronic GERD 05/26/2022   OSA (obstructive sleep apnea) 04/15/2022   Plaque psoriasis 04/14/2022   Mixed hyperlipidemia 09/09/2021   Onychodystrophy 06/25/2021   Neoplasm of uncertain behavior of skin 06/25/2021   Generalized anxiety disorder 06/25/2021   Abnormal cardiac CT angiography    Coronary artery disease involving native coronary artery of native heart    Warfarin anticoagulation 03/15/2019   Encounter for therapeutic drug monitoring 09/29/2018   Aortic valve prosthesis present 09/29/2018   S/P ascending aortic aneurysm repair 09/19/2018   Allergic rhinitis 09/06/2018   Hepatic steatosis 10/30/2016   Insomnia 11/29/2015   Migraine 11/29/2015    No Known Allergies  Past Surgical History:  Procedure Laterality Date   BENTALL PROCEDURE N/A 09/19/2018   Procedure: BENTALL PROCEDURE WITH 23 ST. JUDE GRAFT CONDUIT.;  Surgeon: Alleen Borne, MD;  Location: [facility] OR;  Service: Open Heart Surgery;  Laterality: N/A;   CARDIAC SURGERY     CLEFT LIP REPAIR     CORONARY ANGIOGRAPHY Left 06/13/2021   Procedure: CORONARY ANGIOGRAPHY (CATH LAB);  Surgeon: Iran Ouch, MD;  Location: [facility] INVASIVE CV LAB;  Service: Cardiovascular;  Laterality: Left;   CORONARY PRESSURE/FFR WITH 3D MAPPING N/A 06/13/2021   Procedure: Coronary Pressure Wire, IFR;  Surgeon: Iran Ouch, MD;  Location: [facility] INVASIVE CV LAB;  Service: Cardiovascular;  Laterality: N/A;   ESOPHAGOGASTRODUODENOSCOPY (EGD) WITH PROPOFOL N/A 05/26/2022   Procedure:  ESOPHAGOGASTRODUODENOSCOPY (EGD) WITH PROPOFOL;  Surgeon: Toney Reil, MD;  Location[facility] ENDOSCOPY;  Service: Gastroenterology;  Laterality: N/A;   SUBXYPHOID PERICARDIAL WINDOW N/A 09/26/2018   Procedure: SUBXYPHOID PERICARDIAL WINDOW;  Surgeon: Alleen Borne, MD;  Location: [facility] OR;  Service: Thoracic;  Laterality: N/A;   TEE WITHOUT CARDIOVERSION N/A 09/19/2018   Procedure: TRANSESOPHAGEAL ECHOCARDIOGRAM (TEE);  Surgeon: Alleen Borne, MD;  Location: [facility] OR;  Service: Open Heart Surgery;  Laterality: N/A;   WISDOM TOOTH EXTRACTION      Social History   Tobacco Use   Smoking status: Never   Smokeless tobacco: Never  Vaping Use   Vaping status: Former  Substance Use Topics   Alcohol use: Not Currently   Drug use: Never     Medication list has been reviewed and updated.  Current Meds  Medication Sig   ALPRAZolam (XANAX) 0.5 MG tablet TAKE ONE TABLET BY MOUTH TWICE A DAY AS NEEDED FOR ANXIETY   atorvastatin (LIPITOR) 80 MG tablet Take 1 tablet (80 mg total) by mouth daily.   bimekizumab-bkzx (BIMZELX) 160 MG/ML pen Inject into the skin every 30 (thirty) days.   busPIRone (BUSPAR) 5 MG tablet TAKE ONE TABLET BY MOUTH TWICE A DAY   cetirizine (ZYRTEC) 10 MG tablet Take 10 mg by mouth daily as needed for allergies.   fenofibrate (TRICOR) 145 MG tablet Take 1 tablet (145 mg total) by mouth daily. PLEASE CALL OFFICE TO SCHEDULE APPOINTMENT PRIOR TO NEXT REFILL   isosorbide mononitrate (IMDUR) 30 MG 24 hr tablet Take 1 tablet (30 mg total) by mouth in the morning and at bedtime.   methocarbamol (ROBAXIN) 500 MG tablet Take 1 tablet (500 mg total) by mouth at bedtime.   omeprazole (PRILOSEC) 40 MG capsule Take 1 capsule (40 mg total) by mouth daily.   traZODone (DESYREL) 50 MG tablet Take 0.5-1 tablets (25-50 mg total) by mouth at bedtime as needed for sleep.   Vitamin D, Ergocalciferol, (DRISDOL) 1.25 MG (50000 UNIT) CAPS capsule Take 50,000 Units by mouth once a week.   warfarin (COUMADIN) 5 MG tablet Take 1 tablet to 1.5 tablets by mouth daily or as directed by the Anticoagulation Clinic.       08/03/2023    2:58 PM 03/02/2023    3:48 PM 10/05/2022    8:07 AM 02/25/2022    3:57 PM  GAD 7 : Generalized Anxiety Score  Nervous, Anxious, on Edge 1 2 0 1  Control/stop worrying 1 1 0 0  Worry too much - different things 1 1 0 0  Trouble relaxing 1 1 0 0  Restless 1 0 0 0  Easily annoyed or irritable 1 0 0 0  Afraid  - awful might happen 1 1 0 0  Total GAD 7 Score 7 6 0 1  Anxiety Difficulty Somewhat difficult Somewhat difficult Not difficult at all Not difficult at all       08/03/2023    2:56 PM 03/02/2023    3:48 PM 10/05/2022    8:07 AM  Depression screen PHQ 2/9  Decreased Interest 0 0 0  Down, Depressed, Hopeless 0 0 0  PHQ - 2 Score 0 0 0  Altered sleeping 1 1 2   Tired, decreased energy 1 1 1   Change in appetite 0 0 0  Feeling bad or failure about yourself  0 0 0  Trouble concentrating 0 0 0  Moving slowly or fidgety/restless 0 0 0  Suicidal thoughts 0 0 0  PHQ-9  Score 2 2 3   Difficult doing work/chores Somewhat difficult Not difficult at all Not difficult at all    BP Readings from Last 3 Encounters:  08/03/23 128/72  06/13/23 (!) 122/104  04/30/23 114/76    Physical Exam Vitals and nursing note reviewed.  Constitutional:      General: He is not in acute distress.    Appearance: Normal appearance. He is well-developed.  HENT:     Head: Normocephalic and atraumatic.  Cardiovascular:     Rate and Rhythm: Normal rate and regular rhythm.     Heart sounds: Murmur (audible mechanical valve click) heard.  Pulmonary:     Effort: Pulmonary effort is normal. No respiratory distress.     Breath sounds: No wheezing or rhonchi.  Musculoskeletal:     Cervical back: Normal range of motion.     Right lower leg: No edema.     Left lower leg: No edema.  Lymphadenopathy:     Cervical: No cervical adenopathy.  Skin:    General: Skin is warm and dry.     Findings: No rash.  Neurological:     Mental Status: He is alert and oriented to person, place, and time.  Psychiatric:        Mood and Affect: Mood normal.        Behavior: Behavior normal.     Wt Readings from Last 3 Encounters:  08/03/23 177 lb 2 oz (80.3 kg)  06/13/23 185 lb (83.9 kg)  03/02/23 174 lb 6.4 oz (79.1 kg)    BP 128/72   Pulse 79   Ht 5\' 7"  (1.702 m)   Wt 177 lb 2 oz (80.3 kg)   SpO2 96%   BMI 27.74 kg/m    Assessment and Plan:  Problem List Items Addressed This Visit       Unprioritized   Insomnia - Primary (Chronic)   Will try Trazodone again 25-50 mg at bedtime      Relevant Medications   traZODone (DESYREL) 50 MG tablet   Generalized anxiety disorder (Chronic)   Currently on Buspar and PRN Xanax Will continue current regimen and follow up as needed      Relevant Medications   traZODone (DESYREL) 50 MG tablet    No follow-ups on file.    Reubin Milan, MD [facility] [place]

## 2023-08-03 NOTE — Assessment & Plan Note (Signed)
 Will try Trazodone again 25-50 mg at bedtime

## 2023-08-04 ENCOUNTER — Encounter: Payer: Self-pay | Admitting: Cardiology

## 2023-08-06 ENCOUNTER — Encounter: Payer: Self-pay | Admitting: Internal Medicine

## 2023-08-06 ENCOUNTER — Other Ambulatory Visit: Payer: Self-pay | Admitting: Internal Medicine

## 2023-08-06 DIAGNOSIS — Z792 Long term (current) use of antibiotics: Secondary | ICD-10-CM

## 2023-08-06 MED ORDER — AMOXICILLIN 500 MG PO CAPS: 2000.0000 mg | ORAL_CAPSULE | Freq: Once | ORAL | 0 refills | Status: AC

## 2023-08-09 ENCOUNTER — Ambulatory Visit: Payer: PRIVATE HEALTH INSURANCE | Attending: Cardiology | Admitting: Cardiology

## 2023-08-09 ENCOUNTER — Encounter: Payer: Self-pay | Admitting: Cardiology

## 2023-08-09 VITALS — BP 102/69 | HR 59 | Ht 67.0 in | Wt 178.6 lb

## 2023-08-09 DIAGNOSIS — Z952 Presence of prosthetic heart valve: Secondary | ICD-10-CM

## 2023-08-09 DIAGNOSIS — I2510 Atherosclerotic heart disease of native coronary artery without angina pectoris: Secondary | ICD-10-CM | POA: Diagnosis not present

## 2023-08-09 DIAGNOSIS — E785 Hyperlipidemia, unspecified: Secondary | ICD-10-CM | POA: Diagnosis not present

## 2023-08-09 DIAGNOSIS — I7121 Aneurysm of the ascending aorta, without rupture: Secondary | ICD-10-CM

## 2023-08-09 MED ORDER — ISOSORBIDE MONONITRATE ER 30 MG PO TB24: ORAL_TABLET | ORAL | 1 refills | Status: DC

## 2023-08-09 NOTE — Progress Notes (Signed)
 Cardiology Office Note:    Date:  08/09/2023   ID:  Henry Duncan, DOB [birth year], MRN [id number]  PCP:  Reubin Milan, MD  [facility] Cardiologist:  Debbe Odea, MD  [facility] Electrophysiologist:  None   Referring MD: Reubin Milan, MD   Chief Complaint  Patient presents with   Follow-up    6 month follow up, chest pain yesterday while sitting at work, medication reviewed verbally with patient     History of Present Illness:    Henry Duncan is a 33 y.o. male with a hx of hypertension, CAD (lhc 05/2021-50% mid LAD), aortic aneurysm, bicuspid aortic valve, ascending thoracic aorta dissection s/p Bentall procedure (with 23 mm St. Jude's mechanical valve, developed pericardial effusion postoperatively and underwent pericardial window 08/2018), mixed hyperlipidemia who presents for follow-up.  States being stressed of late, has occasional chest pain which might be due to this.  He runs very frequently without any symptoms of chest pain.  Wife recently had weight loss surgery, associated with some complications prolonging hospital stay.  Compliant with Imdur as prescribed.  Otherwise doing okay.   Prior notes LHC 05/2021 mid LAD 50%.  IFFR 0.95   CCTA 05/2021 severe mid LAD stenosis, calcium score 958.  FFRct did not show significant stenosis TTE 03/2020 EF 55 to 60%, normal functioning prosthetic valve.  Patient presented to the hospital on 09/13/2018 due to chest pain.  CT did reveal ascending thoracic aorta aneurysm measuring 5.7 to 5.8 cm, localized partial dissection of the anterior middle ascending thoracic aorta.  He was subsequently taken for surgery and underwent Bentall procedure.  Last transthoracic echocardiogram 09/16/2018 showed normal systolic and diastolic function, EF 60 to 65%.  Intraoperative TEE on 09/19/2018 showed normal bioprosthetic valve with normal washing jets, transaortic gradient 7 mmHg.  Coronary CTA 2020, calcium score 476,  moderate mid LAD disease   Past Medical History:  Diagnosis Date   Allergy    Anxiety    Aortic aneurysm (HCC) 09/16/2018   a. 08/2018 Asc Ao/root aneuryms w/ dissection-->s/p Bentall w/ 26mm Hemashield graft, 23mm SJM AVR, and reimplantation of cor arteries; b. 04/2021 CTA Chest: stable post-op changes.   CAD (coronary artery disease)    a. 08/2018 Cor CTA: Ca2+ 99th%'ile (476). Calcified plaque in mLAD. Nl LM, LCX, RCA. S/p reimplantation of coronary arteries in setting of Bentall; b. 05/2021 Cor CTA: LAD >56m (nl FFRct); c. 05/2021 Cath: LM nl, LAD 40p, 25m, LCX/OM1 min irregs, RCA nl->Med rx.   H/O mechanical aortic valve replacement    a. 08/2018 in setting of bicuspid AoV and Asc Ao dissection-->24mm SJM mech AVR; b. 03/2020 Echo: EF 55-60%. No rwma. Mild LVH. Nl RV fxn. Nl functioning AVR.   Hyperlipidemia    Hypertension    Pericardial effusion    a. 08/2018 post-op -->s/p window.    Past Surgical History:  Procedure Laterality Date   BENTALL PROCEDURE N/A 09/19/2018   Procedure: BENTALL PROCEDURE WITH 23 ST. JUDE GRAFT CONDUIT.;  Surgeon: Alleen Borne, MD;  Location: [facility] OR;  Service: Open Heart Surgery;  Laterality: N/A;   CARDIAC SURGERY     CLEFT LIP REPAIR     CORONARY ANGIOGRAPHY Left 06/13/2021   Procedure: CORONARY ANGIOGRAPHY (CATH LAB);  Surgeon: Iran Ouch, MD;  Location: [facility] INVASIVE CV LAB;  Service: Cardiovascular;  Laterality: Left;   CORONARY PRESSURE/FFR WITH 3D MAPPING N/A 06/13/2021   Procedure: Coronary Pressure Wire, IFR;  Surgeon: Iran Ouch, MD;  Location: [facility] INVASIVE CV LAB;  Service: Cardiovascular;  Laterality: N/A;   ESOPHAGOGASTRODUODENOSCOPY (EGD) WITH PROPOFOL N/A 05/26/2022   Procedure: ESOPHAGOGASTRODUODENOSCOPY (EGD) WITH PROPOFOL;  Surgeon: Toney Reil, MD;  Location: [facility] ENDOSCOPY;  Service: Gastroenterology;  Laterality: N/A;   SUBXYPHOID PERICARDIAL WINDOW N/A 09/26/2018   Procedure: SUBXYPHOID PERICARDIAL WINDOW;   Surgeon: Alleen Borne, MD;  Location: [facility] OR;  Service: Thoracic;  Laterality: N/A;   TEE WITHOUT CARDIOVERSION N/A 09/19/2018   Procedure: TRANSESOPHAGEAL ECHOCARDIOGRAM (TEE);  Surgeon: Alleen Borne, MD;  Location: [facility] OR;  Service: Open Heart Surgery;  Laterality: N/A;   WISDOM TOOTH EXTRACTION      Current Medications: Current Meds  Medication Sig   ALPRAZolam (XANAX) 0.5 MG tablet TAKE ONE TABLET BY MOUTH TWICE A DAY AS NEEDED FOR ANXIETY   atorvastatin (LIPITOR) 80 MG tablet Take 1 tablet (80 mg total) by mouth daily.   Bempedoic Acid-Ezetimibe (NEXLIZET) 180-10 MG TABS Take 1 tablet by mouth daily.   bimekizumab-bkzx (BIMZELX) 160 MG/ML pen Inject into the skin every 30 (thirty) days.   busPIRone (BUSPAR) 5 MG tablet TAKE ONE TABLET BY MOUTH TWICE A DAY   cetirizine (ZYRTEC) 10 MG tablet Take 10 mg by mouth daily as needed for allergies.   clobetasol cream (TEMOVATE) 0.05 % Apply 1 application topically 2 (two) times daily. To external ear canals   fenofibrate (TRICOR) 145 MG tablet Take 1 tablet (145 mg total) by mouth daily. PLEASE CALL OFFICE TO SCHEDULE APPOINTMENT PRIOR TO NEXT REFILL   methocarbamol (ROBAXIN) 500 MG tablet Take 1 tablet (500 mg total) by mouth at bedtime.   omeprazole (PRILOSEC) 40 MG capsule Take 1 capsule (40 mg total) by mouth daily.   oxyCODONE (OXY IR/ROXICODONE) 5 MG immediate release tablet Take 1 tablet (5 mg total) by mouth every 6 (six) hours as needed.   traZODone (DESYREL) 50 MG tablet Take 0.5-1 tablets (25-50 mg total) by mouth at bedtime as needed for sleep.   Vitamin D, Ergocalciferol, (DRISDOL) 1.25 MG (50000 UNIT) CAPS capsule Take 50,000 Units by mouth once a week.   warfarin (COUMADIN) 5 MG tablet Take 1 tablet to 1.5 tablets by mouth daily or as directed by the Anticoagulation Clinic.   ZORYVE 0.3 % CREA    [DISCONTINUED] isosorbide mononitrate (IMDUR) 30 MG 24 hr tablet Take 1 tablet (30 mg total) by mouth in the morning and at bedtime.      Allergies:   Patient has no known allergies.   Social History   Socioeconomic History   Marital status: Married    Spouse name: Not on file   Number of children: 0   Years of education: Not on file   Highest education level: 12th grade  Occupational History   Not on file  Tobacco Use   Smoking status: Never   Smokeless tobacco: Never  Vaping Use   Vaping status: Former  Substance and Sexual Activity   Alcohol use: Not Currently   Drug use: Never   Sexual activity: Yes    Partners: Female    Birth control/protection: Condom  Other Topics Concern   Not on file  Social History Narrative   Not on file   Social Drivers of Health   Financial Resource Strain: Low Risk  (08/03/2023)   Overall Financial Resource Strain (CARDIA)    Difficulty of Paying Living Expenses: Not very hard  Food Insecurity: No Food Insecurity (08/03/2023)   Hunger Vital Sign    Worried About Running Out  of Food in the Last Year: Never true    Ran Out of Food in the Last Year: Never true  Transportation Needs: No Transportation Needs (08/03/2023)   PRAPARE - Administrator, Civil Service (Medical): No    Lack of Transportation (Non-Medical): No  Physical Activity: Insufficiently Active (08/03/2023)   Exercise Vital Sign    Days of Exercise per Week: 5 days    Minutes of Exercise per Session: 20 min  Stress: Stress Concern Present (08/03/2023)   [employer] of Occupational Health - Occupational Stress Questionnaire    Feeling of Stress : Rather much  Social Connections: Socially Integrated (08/03/2023)   Social Connection and Isolation Panel [NHANES]    Frequency of Communication with Friends and Family: More than three times a week    Frequency of Social Gatherings with Friends and Family: Twice a week    Attends Religious Services: More than 4 times per year    Active Member of [employer] or Organizations: Yes    Attends Engineer, structural: More than 4 times per year     Marital Status: Married     Family History: The patient's family history includes Anxiety disorder in his brother and father; Depression in his brother; Diabetes in his father; Heart attack in his mother.  ROS:   Please see the history of present illness.     All other systems reviewed and are negative.  EKGs/Labs/Other Studies Reviewed:    The following studies were reviewed today:   EKG:  EKG not ordered today.   Recent Labs: 10/05/2022: TSH 0.235 11/23/2022: ALT 61; BUN 14; Creatinine, Ser 0.71; Hemoglobin 14.5; Platelets 216; Potassium 3.9; Sodium 136  Recent Lipid Panel    Component Value Date/Time   CHOL 123 09/11/2022 0653   CHOL 211 (H) 09/30/2021 1118   TRIG 175 (H) 09/11/2022 0653   HDL 36 (L) 09/11/2022 0653   HDL 46 09/30/2021 1118   CHOLHDL 3.4 09/11/2022 0653   VLDL 35 09/11/2022 0653   LDLCALC 52 09/11/2022 0653   LDLCALC 133 (H) 09/30/2021 1118     Risk Assessment/Calculations:      Physical Exam:    VS:  BP 102/69 (BP Location: Left Arm, Patient Position: Sitting, Cuff Size: Normal)   Pulse (!) 59   Ht 5\' 7"  (1.702 m)   Wt 178 lb 9.6 oz (81 kg)   SpO2 96%   BMI 27.97 kg/m     Wt Readings from Last 3 Encounters:  08/09/23 178 lb 9.6 oz (81 kg)  08/03/23 177 lb 2 oz (80.3 kg)  06/13/23 185 lb (83.9 kg)     GEN:  Well nourished, well developed in no acute distress HEENT: Normal NECK: No JVD; No carotid bruits CARDIAC: RRR, 2/6 systolic murmur, mechanical click consistent with mechanical valve noted. RESPIRATORY:  Clear to auscultation without rales, wheezing or rhonchi  ABDOMEN: Soft, non-tender, non-distended MUSCULOSKELETAL:  No edema; No deformity  SKIN: Warm and dry NEUROLOGIC:  Alert and oriented x 3 PSYCHIATRIC:  Normal affect   ASSESSMENT:    1. Coronary artery disease involving native coronary artery of native heart without angina pectoris   2. Aneurysm of ascending aorta without rupture (HCC)   3. H/O mechanical aortic valve  replacement   4. Hyperlipidemia LDL goal <70   5. Coronary artery disease involving native coronary artery of native heart, unspecified whether angina present    PLAN:    In order of problems listed above:  CAD, LHC  05/2021 mid LAD 50%.  IFFR 0.95.  Has occasional chest pain associated with stress.  Increase Imdur to 90 mg daily.  Continue Lipitor, warfarin.  aortic root aneurysm, dissection, s/p Bentall procedure with aortic root replacement and mechanical aortic valve (08/2018).  Chest CT with stable aorta size.  History of mechanical aortic valve replacement, normal aortic valve prosthesis on echo 03/2022.  EF 60 to 65%.  Continue warfarin. hyperlipidemia, Continue Nexlizet, Lipitor  80 mg daily, fenofibrate.    Follow-up in 2-3 months    Medication Adjustments/Labs and Tests Ordered: Current medicines are reviewed at length with the patient today.  Concerns regarding medicines are outlined above.  Orders Placed This Encounter  Procedures   EKG 12-Lead   Meds ordered this encounter  Medications   isosorbide mononitrate (IMDUR) 30 MG 24 hr tablet    Sig: Take 2 tablets (60 mg total) by mouth every morning AND 1 tablet (30 mg total) at bedtime.    Dispense:  270 tablet    Refill:  1    Patient Instructions  Medication Instructions:  Your physician recommends the following medication changes.   INCREASE: Imdur to 60 mg in the morning and 30 mg in the evening   *If you need a refill on your cardiac medications before your next appointment, please call your pharmacy*   Lab Work: No labs ordered today    Testing/Procedures: No test ordered today    Follow-Up: At [facility], you and your health needs are our priority.  As part of our continuing mission to provide you with exceptional heart care, we have created designated Provider Care Teams.  These Care Teams include your primary Cardiologist (physician) and Advanced Practice Providers (APPs -  Physician  Assistants and Nurse Practitioners) who all work together to provide you with the care you need, when you need it.  We recommend signing up for the patient portal called "MyChart".  Sign up information is provided on this After Visit Summary.  MyChart is used to connect with patients for Virtual Visits (Telemedicine).  Patients are able to view lab/test results, encounter notes, upcoming appointments, etc.  Non-urgent messages can be sent to your provider as well.   To learn more about what you can do with MyChart, go to [email or web address].    Your next appointment:   2-3 month(s)  Provider:    Debbe Odea, MD    Signed, Debbe Odea, MD  08/09/2023 12:54 PM    [facility]

## 2023-08-09 NOTE — Patient Instructions (Signed)
 Medication Instructions:  Your physician recommends the following medication changes.   INCREASE: Imdur to 60 mg in the morning and 30 mg in the evening   *If you need a refill on your cardiac medications before your next appointment, please call your pharmacy*   Lab Work: No labs ordered today    Testing/Procedures: No test ordered today    Follow-Up: At [facility], you and your health needs are our priority.  As part of our continuing mission to provide you with exceptional heart care, we have created designated Provider Care Teams.  These Care Teams include your primary Cardiologist (physician) and Advanced Practice Providers (APPs -  Physician Assistants and Nurse Practitioners) who all work together to provide you with the care you need, when you need it.  We recommend signing up for the patient portal called "MyChart".  Sign up information is provided on this After Visit Summary.  MyChart is used to connect with patients for Virtual Visits (Telemedicine).  Patients are able to view lab/test results, encounter notes, upcoming appointments, etc.  Non-urgent messages can be sent to your provider as well.   To learn more about what you can do with MyChart, go to [email or web address].    Your next appointment:   2-3 month(s)  Provider:    Debbe Odea, MD

## 2023-08-11 ENCOUNTER — Encounter: Payer: Self-pay | Admitting: Internal Medicine

## 2023-08-16 ENCOUNTER — Encounter (HOSPITAL_COMMUNITY): Payer: Self-pay

## 2023-08-16 ENCOUNTER — Observation Stay (HOSPITAL_COMMUNITY)
Admission: EM | Admit: 2023-08-16 | Discharge: 2023-08-18 | Disposition: A | Discharge status: Home / Self Care | Payer: PRIVATE HEALTH INSURANCE | Attending: Cardiology | Admitting: Cardiology

## 2023-08-16 ENCOUNTER — Other Ambulatory Visit: Payer: Self-pay

## 2023-08-16 ENCOUNTER — Emergency Department (HOSPITAL_COMMUNITY): Payer: PRIVATE HEALTH INSURANCE

## 2023-08-16 DIAGNOSIS — Z952 Presence of prosthetic heart valve: Secondary | ICD-10-CM

## 2023-08-16 DIAGNOSIS — R079 Chest pain, unspecified: Principal | ICD-10-CM

## 2023-08-16 DIAGNOSIS — E785 Hyperlipidemia, unspecified: Secondary | ICD-10-CM | POA: Insufficient documentation

## 2023-08-16 DIAGNOSIS — I10 Essential (primary) hypertension: Secondary | ICD-10-CM | POA: Insufficient documentation

## 2023-08-16 DIAGNOSIS — Z5181 Encounter for therapeutic drug level monitoring: Secondary | ICD-10-CM

## 2023-08-16 DIAGNOSIS — Z79899 Other long term (current) drug therapy: Secondary | ICD-10-CM | POA: Insufficient documentation

## 2023-08-16 DIAGNOSIS — Z7901 Long term (current) use of anticoagulants: Secondary | ICD-10-CM | POA: Diagnosis not present

## 2023-08-16 DIAGNOSIS — I2510 Atherosclerotic heart disease of native coronary artery without angina pectoris: Secondary | ICD-10-CM | POA: Diagnosis present

## 2023-08-16 DIAGNOSIS — R072 Precordial pain: Secondary | ICD-10-CM | POA: Diagnosis not present

## 2023-08-16 DIAGNOSIS — I719 Aortic aneurysm of unspecified site, without rupture: Secondary | ICD-10-CM | POA: Insufficient documentation

## 2023-08-16 DIAGNOSIS — R7989 Other specified abnormal findings of blood chemistry: Secondary | ICD-10-CM

## 2023-08-16 DIAGNOSIS — R0789 Other chest pain: Secondary | ICD-10-CM | POA: Diagnosis present

## 2023-08-16 LAB — COMPREHENSIVE METABOLIC PANEL
ALT: 66 U/L — ABNORMAL HIGH (ref 0–44)
AST: 65 U/L — ABNORMAL HIGH (ref 15–41)
Albumin: 4.2 g/dL (ref 3.5–5.0)
Alkaline Phosphatase: 41 U/L (ref 38–126)
Anion gap: 10 (ref 5–15)
BUN: 12 mg/dL (ref 6–20)
CO2: 23 mmol/L (ref 22–32)
Calcium: 9.2 mg/dL (ref 8.9–10.3)
Chloride: 103 mmol/L (ref 98–111)
Creatinine, Ser: 0.85 mg/dL (ref 0.61–1.24)
GFR, Estimated: >60 mL/min (ref >60)
Glucose, Bld: 102 mg/dL — ABNORMAL HIGH (ref 70–99)
Potassium: 4 mmol/L (ref 3.5–5.1)
Sodium: 136 mmol/L (ref 135–145)
Total Bilirubin: 1 mg/dL (ref 0.0–1.2)
Total Protein: 6.4 g/dL — ABNORMAL LOW (ref 6.5–8.1)

## 2023-08-16 LAB — CBC
HCT: 42.2 % (ref 39.0–52.0)
Hemoglobin: 14.2 g/dL (ref 13.0–17.0)
MCH: 31.2 pg (ref 26.0–34.0)
MCHC: 33.6 g/dL (ref 30.0–36.0)
MCV: 92.7 fL (ref 80.0–100.0)
Platelets: 212 10*3/uL (ref 150–400)
RBC: 4.55 MIL/uL (ref 4.22–5.81)
RDW: 13.1 % (ref 11.5–15.5)
WBC: 7.1 10*3/uL (ref 4.0–10.5)
nRBC: 0 % (ref 0.0–0.2)

## 2023-08-16 LAB — HIV ANTIBODY (ROUTINE TESTING W REFLEX): HIV Screen 4th Generation wRfx: NONREACTIVE

## 2023-08-16 LAB — PROTIME-INR
INR: 2.4 — ABNORMAL HIGH (ref 0.8–1.2)
Prothrombin Time: 26.2 s — ABNORMAL HIGH (ref 11.4–15.2)

## 2023-08-16 LAB — TROPONIN I (HIGH SENSITIVITY)
Troponin I (High Sensitivity): 39 ng/L — ABNORMAL HIGH (ref <18)
Troponin I (High Sensitivity): 52 ng/L — ABNORMAL HIGH (ref <18)

## 2023-08-16 LAB — C-REACTIVE PROTEIN: CRP: 0.5 mg/dL (ref <1.0)

## 2023-08-16 LAB — SEDIMENTATION RATE: Sed Rate: 1 mm/h (ref 0–16)

## 2023-08-16 MED ORDER — HYDROMORPHONE HCL 1 MG/ML IJ SOLN
0.5000 mg | Freq: Once | INTRAMUSCULAR | Status: AC
  Administered 2023-08-16: 0.5 mg via INTRAVENOUS
  Filled 2023-08-16: qty 1

## 2023-08-16 MED ORDER — BUSPIRONE HCL 5 MG PO TABS
5.0000 mg | ORAL_TABLET | Freq: Two times a day (BID) | ORAL | Status: DC
  Administered 2023-08-16 – 2023-08-18 (×4): 5 mg via ORAL
  Filled 2023-08-16 (×4): qty 1

## 2023-08-16 MED ORDER — ASPIRIN 81 MG PO CHEW
324.0000 mg | CHEWABLE_TABLET | Freq: Once | ORAL | Status: AC
  Administered 2023-08-16: 324 mg via ORAL
  Filled 2023-08-16: qty 4

## 2023-08-16 MED ORDER — HEPARIN (PORCINE) 25000 UT/250ML-% IV SOLN
1200.0000 [IU]/h | INTRAVENOUS | Status: DC
  Administered 2023-08-16: 1000 [IU]/h via INTRAVENOUS
  Filled 2023-08-16: qty 250

## 2023-08-16 MED ORDER — OXYCODONE HCL 5 MG PO TABS
5.0000 mg | ORAL_TABLET | Freq: Four times a day (QID) | ORAL | Status: DC | PRN
  Administered 2023-08-16 – 2023-08-18 (×6): 5 mg via ORAL
  Filled 2023-08-16 (×6): qty 1

## 2023-08-16 MED ORDER — ONDANSETRON HCL 4 MG/2ML IJ SOLN
4.0000 mg | Freq: Once | INTRAMUSCULAR | Status: AC
  Administered 2023-08-16: 4 mg via INTRAVENOUS
  Filled 2023-08-16: qty 2

## 2023-08-16 MED ORDER — PANTOPRAZOLE SODIUM 40 MG PO TBEC
80.0000 mg | DELAYED_RELEASE_TABLET | Freq: Every day | ORAL | Status: DC
  Administered 2023-08-16 – 2023-08-18 (×3): 80 mg via ORAL
  Filled 2023-08-16 (×3): qty 2

## 2023-08-16 MED ORDER — ASPIRIN 81 MG PO TBEC
81.0000 mg | DELAYED_RELEASE_TABLET | Freq: Every day | ORAL | Status: DC
  Administered 2023-08-17 – 2023-08-18 (×2): 81 mg via ORAL
  Filled 2023-08-16 (×2): qty 1

## 2023-08-16 MED ORDER — ATORVASTATIN CALCIUM 80 MG PO TABS
80.0000 mg | ORAL_TABLET | Freq: Every day | ORAL | Status: DC
  Administered 2023-08-16 – 2023-08-18 (×3): 80 mg via ORAL
  Filled 2023-08-16 (×2): qty 1
  Filled 2023-08-16: qty 2

## 2023-08-16 MED ORDER — ACETAMINOPHEN 325 MG PO TABS
650.0000 mg | ORAL_TABLET | ORAL | Status: DC | PRN
  Administered 2023-08-17 – 2023-08-18 (×3): 650 mg via ORAL
  Filled 2023-08-16 (×3): qty 2

## 2023-08-16 MED ORDER — BEMPEDOIC ACID-EZETIMIBE 180-10 MG PO TABS
1.0000 | ORAL_TABLET | Freq: Every day | ORAL | Status: DC
Start: 2023-08-16 — End: 2023-08-17

## 2023-08-16 MED ORDER — MORPHINE SULFATE (PF) 4 MG/ML IV SOLN
4.0000 mg | Freq: Once | INTRAVENOUS | Status: AC
  Administered 2023-08-16: 4 mg via INTRAVENOUS
  Filled 2023-08-16: qty 1

## 2023-08-16 MED ORDER — DIAZEPAM 2 MG PO TABS
2.0000 mg | ORAL_TABLET | Freq: Once | ORAL | Status: AC
  Administered 2023-08-16: 2 mg via ORAL
  Filled 2023-08-16: qty 1

## 2023-08-16 MED ORDER — ALPRAZOLAM 0.5 MG PO TABS
0.5000 mg | ORAL_TABLET | Freq: Two times a day (BID) | ORAL | Status: DC | PRN
  Administered 2023-08-16 – 2023-08-17 (×3): 0.5 mg via ORAL
  Filled 2023-08-16 (×3): qty 1

## 2023-08-16 MED ORDER — ONDANSETRON HCL 4 MG/2ML IJ SOLN: 4.0000 mg | Freq: Four times a day (QID) | INTRAMUSCULAR | Status: DC | PRN

## 2023-08-16 MED ORDER — TRAZODONE HCL 50 MG PO TABS
50.0000 mg | ORAL_TABLET | Freq: Every evening | ORAL | Status: DC | PRN
  Administered 2023-08-16 – 2023-08-17 (×2): 50 mg via ORAL
  Filled 2023-08-16 (×2): qty 1

## 2023-08-16 MED ORDER — NITROGLYCERIN 0.4 MG SL SUBL: 0.4000 mg | SUBLINGUAL_TABLET | SUBLINGUAL | Status: DC | PRN

## 2023-08-16 MED ORDER — ISOSORBIDE MONONITRATE ER 30 MG PO TB24
30.0000 mg | ORAL_TABLET | Freq: Two times a day (BID) | ORAL | Status: DC
Start: 2023-08-16 — End: 2023-08-18
  Administered 2023-08-16 – 2023-08-18 (×4): 30 mg via ORAL
  Filled 2023-08-16 (×4): qty 1

## 2023-08-16 MED ORDER — LORATADINE 10 MG PO TABS
10.0000 mg | ORAL_TABLET | Freq: Every day | ORAL | Status: DC
  Administered 2023-08-16 – 2023-08-18 (×3): 10 mg via ORAL
  Filled 2023-08-16 (×4): qty 1

## 2023-08-16 MED ORDER — FENOFIBRATE 160 MG PO TABS
160.0000 mg | ORAL_TABLET | Freq: Every day | ORAL | Status: DC
  Administered 2023-08-17 – 2023-08-18 (×2): 160 mg via ORAL
  Filled 2023-08-16 (×2): qty 1

## 2023-08-16 MED ORDER — METHOCARBAMOL 500 MG PO TABS
500.0000 mg | ORAL_TABLET | Freq: Every day | ORAL | Status: DC
  Administered 2023-08-17: 500 mg via ORAL
  Filled 2023-08-16: qty 1

## 2023-08-16 NOTE — ED Provider Triage Note (Signed)
 Emergency Medicine Provider Triage Evaluation Note  Henry Duncan , a 33 y.o. male  was evaluated in triage.  Pt complains of left-sided chest pain going on for the last couple weeks, but worse today.  He states he went to the cardiologist last week, and they increased his Imdur, but he has not had any symptomatic relief.  It is left-sided, and occasionally goes down his left arm.  He has a history of thoracic aortic aneurysm repair, and history of the dissection.  Has been compliant with his warfarin.  Notes that the pain got worse 1 hour after jogging today.  Denies any shortness of breath.  Review of Systems  Positive: Chest pain Negative: SOB  Physical Exam  BP 106/80 (BP Location: Right Arm)   Pulse 85   Temp 98.3 F (36.8 C)   Resp 20   Ht 5\' 7"  (1.702 m)   Wt 81 kg   SpO2 95%   BMI 27.97 kg/m  Gen:   Awake, no distress   Resp:  Normal effort  MSK:   Moves extremities without difficulty  Other:    Medical Decision Making  Medically screening exam initiated at 2:57 PM.  Appropriate orders placed.  Henry Duncan was informed that the remainder of the evaluation will be completed by another provider, this initial triage assessment does not replace that evaluation, and the importance of remaining in the ED until their evaluation is complete.    Pete Pelt, [place] 08/16/23 1458

## 2023-08-16 NOTE — ED Provider Notes (Signed)
 [place] EMERGENCY DEPARTMENT AT [facility] Provider Note   CSN: [id number] Arrival date & time: 08/16/23  1442     History  Chief Complaint  Patient presents with   Chest Pain    Henry Duncan is a 33 y.o. male.  33 y.o. male with a hx of hypertension, CAD, aortic aneurysm, bicuspid aortic valve, ascending thoracic aorta dissection s/p Bentall procedure with mechanical valve on coumadin, mixed hyperlipidemia presenting to the emergency department with chest pain.  Patient reports that he has had chest pain coming and going for the last 2 to 3 weeks.  He recently saw his cardiologist who increased his Imdur and he states that despite increasing this the chest pain has continued and seems to have gotten worse.  He states that today he initially felt well when he woke up and went for a run.  He states about an hour after the run he started to develop the chest pain.  He describes as a sharp left-sided chest pain.  He states is nonradiating.  Reports some mild nausea, denies any vomiting, shortness of breath or diaphoresis.  He denies any lower extremity swelling.  He states he has been compliant on his medication.  He states that he has had a chronic cough for about the last 2 months it has been productive of mucus.  Denies any fever.  The history is provided by the patient and a relative.  Chest Pain      Home Medications Prior to Admission medications   Medication Sig Start Date End Date Taking? Authorizing Provider  ALPRAZolam Prudy Feeler) 0.5 MG tablet TAKE ONE TABLET BY MOUTH TWICE A DAY AS NEEDED FOR ANXIETY 07/28/23   Reubin Milan, MD  atorvastatin (LIPITOR) 80 MG tablet Take 1 tablet (80 mg total) by mouth daily. 04/07/23 04/01/24  Debbe Odea, MD  Bempedoic Acid-Ezetimibe (NEXLIZET) 180-10 MG TABS Take 1 tablet by mouth daily. 07/30/23   Debbe Odea, MD  bimekizumab-bkzx [facility]) 160 MG/ML pen Inject into the skin every 30 (thirty) days.     [provider]  busPIRone (BUSPAR) 5 MG tablet TAKE ONE TABLET BY MOUTH TWICE A DAY 03/07/23   Reubin Milan, MD  cetirizine (ZYRTEC) 10 MG tablet Take 10 mg by mouth daily as needed for allergies.    [provider]  clobetasol cream (TEMOVATE) 0.05 % Apply 1 application topically 2 (two) times daily. To external ear canals 09/03/20   Reubin Milan, MD  fenofibrate (TRICOR) 145 MG tablet Take 1 tablet (145 mg total) by mouth daily. PLEASE CALL OFFICE TO SCHEDULE APPOINTMENT PRIOR TO NEXT REFILL 06/01/23   Debbe Odea, MD  isosorbide mononitrate (IMDUR) 30 MG 24 hr tablet Take 2 tablets (60 mg total) by mouth every morning AND 1 tablet (30 mg total) at bedtime. 08/09/23 11/07/23  Debbe Odea, MD  methocarbamol (ROBAXIN) 500 MG tablet Take 1 tablet (500 mg total) by mouth at bedtime. 02/25/22   Reubin Milan, MD  omeprazole (PRILOSEC) 40 MG capsule Take 1 capsule (40 mg total) by mouth daily. 02/10/23 02/05/24  Celso Amy, PA-C  oxyCODONE (OXY IR/ROXICODONE) 5 MG immediate release tablet Take 1 tablet (5 mg total) by mouth every 6 (six) hours as needed. 06/13/23   Tommi Rumps, PA-C  traZODone (DESYREL) 50 MG tablet Take 0.5-1 tablets (25-50 mg total) by mouth at bedtime as needed for sleep. 08/03/23   Reubin Milan, MD  Vitamin D, Ergocalciferol, (DRISDOL) 1.25 MG (50000 UNIT)  CAPS capsule Take 50,000 Units by mouth once a week. 01/28/23   [provider]  warfarin (COUMADIN) 5 MG tablet Take 1 tablet to 1.5 tablets by mouth daily or as directed by the Anticoagulation Clinic. 07/05/23   Debbe Odea, MD  ZORYVE 0.3 % CREA  01/08/22   [provider]      Allergies    Patient has no known allergies.    Review of Systems   Review of Systems  Cardiovascular:  Positive for chest pain.    Physical Exam Updated Vital Signs BP 102/68   Pulse 62   Temp 98.3 F (36.8 C)   Resp 11   Ht 5\' 7"  (1.702 m)   Wt 81 kg   SpO2 98%    BMI 27.97 kg/m  Physical Exam Vitals and nursing note reviewed.  Constitutional:      General: He is not in acute distress.    Appearance: He is well-developed.  HENT:     Head: Normocephalic and atraumatic.  Eyes:     Extraocular Movements: Extraocular movements intact.  Cardiovascular:     Rate and Rhythm: Normal rate and regular rhythm.     Pulses:          Radial pulses are 2+ on the right side and 2+ on the left side.     Comments: Mechanical valve heart sounds auscultated Pulmonary:     Effort: Pulmonary effort is normal.     Breath sounds: Normal breath sounds.  Chest:     Chest wall: No tenderness.  Abdominal:     Palpations: Abdomen is soft.     Tenderness: There is no abdominal tenderness.  Musculoskeletal:        General: Normal range of motion.     Cervical back: Normal range of motion and neck supple.     Right lower leg: No edema.     Left lower leg: No edema.  Skin:    General: Skin is warm and dry.  Neurological:     General: No focal deficit present.     Mental Status: He is alert and oriented to person, place, and time.  Psychiatric:        Mood and Affect: Mood normal.        Behavior: Behavior normal.     ED Results / Procedures / Treatments   Labs (all labs ordered are listed, but only abnormal results are displayed) Labs Reviewed  COMPREHENSIVE METABOLIC PANEL - Abnormal; Notable for the following components:      Result Value   Glucose, Bld 102 (*)    Total Protein 6.4 (*)    AST 65 (*)    ALT 66 (*)    All other components within normal limits  PROTIME-INR - Abnormal; Notable for the following components:   Prothrombin Time 26.2 (*)    INR 2.4 (*)    All other components within normal limits  TROPONIN I (HIGH SENSITIVITY) - Abnormal; Notable for the following components:   Troponin I (High Sensitivity) 39 (*)    All other components within normal limits  TROPONIN I (HIGH SENSITIVITY) - Abnormal; Notable for the following components:    Troponin I (High Sensitivity) 52 (*)    All other components within normal limits  CBC    EKG EKG Interpretation Date/Time:  Monday August 16 2023 14:47:50 EDT Ventricular Rate:  74 PR Interval:  134 QRS Duration:  98 QT Interval:  386 QTC Calculation: 428 R Axis:   57  Text Interpretation: Normal sinus rhythm with sinus arrhythmia Normal ECG No significant change since last tracing Confirmed by Elayne Snare ([id number]) on 08/16/2023 4:23:22 PM  Radiology DG Chest 2 View Result Date: 08/16/2023 CLINICAL DATA:  Chest pain EXAM: CHEST - 2 VIEW COMPARISON:  06/13/2023 FINDINGS: Previous median sternotomy and aortic valve replacement. Stable cardiac silhouette and vascularity. Negative for edema, acute CHF, pneumonia, collapse or consolidation. No large effusion or pneumothorax. Trachea midline. No osseous abnormality. IMPRESSION: 1. Previous median sternotomy and aortic valve replacement. 2. No acute chest process. Electronically Signed   By: Judie Petit.  Shick M.D.   On: 08/16/2023 15:43    Procedures Procedures    Medications Ordered in ED Medications  aspirin chewable tablet 324 mg (has no administration in time range)  HYDROmorphone (DILAUDID) injection 0.5 mg (has no administration in time range)  diazepam (VALIUM) tablet 2 mg (2 mg Oral Given 08/16/23 1509)  morphine (PF) 4 MG/ML injection 4 mg (4 mg Intravenous Given 08/16/23 1706)  ondansetron (ZOFRAN) injection 4 mg (4 mg Intravenous Given 08/16/23 1705)    ED Course/ Medical Decision Making/ A&P Clinical Course as of 08/16/23 1922  Mon Aug 16, 2023  1721 Initial troponin mildly elevated.  Will need repeat.  Labs otherwise appear to be at baseline.  INR is pending. [VK]  1824 Rpt trop untrending, will consult cards. [VK]  [id number] I spoke with Dr. Servando Salina with cardiology who will evaluate the patient and recommends admission for high risk chest pain. [VK]    Clinical Course User Index [VK] Rexford Maus, DO                                  Medical Decision Making This patient presents to the ED with chief complaint(s) of chest pain with pertinent past medical history of CAD, aortic aneurysm and bicuspid valve status post repair on Coumadin, hyperlipidemia which further complicates the presenting complaint. The complaint involves an extensive differential diagnosis and also carries with it a high risk of complications and morbidity.    The differential diagnosis includes ACS, arrhythmia, anemia, pneumonia, pneumothorax, pulmonary edema, pleural effusion, PE unlikely as he has been compliant on his Coumadin, dissection less likely due to chronicity of the pain, musculoskeletal pain, gastritis, GERD  Additional history obtained: Additional history obtained from family Records reviewed outpatient cardiology records  ED Course and Reassessment: On patient's arrival he is hemodynamically stable in no acute distress.  EKG on arrival had no acute ischemic changes.  Patient labs initiated in triage and are pending at this time as well as a chest x-ray.  The patient will be given pain and nausea control and will be closely reassessed.  Independent labs interpretation:  The following labs were independently interpreted: troponin uptrending 39 -> 52, otherwise labs within normal range  Independent visualization of imaging: - I independently visualized the following imaging with scope of interpretation limited to determining acute life threatening conditions related to emergency care: CXR, which revealed no acute disease  Consultation: - Consulted or discussed management/test interpretation w/ external professional: cardiology  Consideration for admission or further workup: patient requires admission for high risk chest pain Social Determinants of health: N/A    Amount and/or Complexity of Data Reviewed Labs: ordered. Radiology: ordered.  Risk OTC drugs. Prescription drug management. Decision regarding  hospitalization.          Final Clinical Impression(s) / ED Diagnoses Final diagnoses:  Nonspecific  chest pain  Elevated troponin    Rx / DC Orders ED Discharge Orders     None         Rexford Maus, DO 08/16/23 1922

## 2023-08-16 NOTE — Progress Notes (Addendum)
 PHARMACY - ANTICOAGULATION CONSULT NOTE  Pharmacy Consult for heparin  Indication: chest pain/ACS and warfarin PTA for  aortic mechanical valve   No Known Allergies  Patient Measurements: Height: 5\' 7"  (170.2 cm) Weight: 81 kg (178 lb 9.2 oz) IBW/kg (Calculated) : 66.1 Heparin Dosing Weight: 81kg   Vital Signs: Temp: 98.3 F (36.8 C) (03/24 1446) BP: 102/68 (03/24 1900) Pulse Rate: 62 (03/24 1900)  Labs: Recent Labs    08/16/23 1448 08/16/23 1450 08/16/23 1645 08/16/23 1732  HGB 14.2  --   --   --   HCT 42.2  --   --   --   PLT 212  --   --   --   LABPROT  --   --   --  26.2*  INR  --   --   --  2.4*  CREATININE  --  0.85  --   --   TROPONINIHS 39*  --  52*  --     Estimated Creatinine Clearance: 127.2 mL/min (by C-G formula based on SCr of 0.85 mg/dL).   Medical History: Past Medical History:  Diagnosis Date   Allergy    Anxiety    Aortic aneurysm (HCC) 09/16/2018   a. 08/2018 Asc Ao/root aneuryms w/ dissection-->s/p Bentall w/ 26mm Hemashield graft, 23mm SJM AVR, and reimplantation of cor arteries; b. 04/2021 CTA Chest: stable post-op changes.   CAD (coronary artery disease)    a. 08/2018 Cor CTA: Ca2+ 99th%'ile (476). Calcified plaque in mLAD. Nl LM, LCX, RCA. S/p reimplantation of coronary arteries in setting of Bentall; b. 05/2021 Cor CTA: LAD >8m (nl FFRct); c. 05/2021 Cath: LM nl, LAD 40p, 72m, LCX/OM1 min irregs, RCA nl->Med rx.   H/O mechanical aortic valve replacement    a. 08/2018 in setting of bicuspid AoV and Asc Ao dissection-->73mm SJM mech AVR; b. 03/2020 Echo: EF 55-60%. No rwma. Mild LVH. Nl RV fxn. Nl functioning AVR.   Hyperlipidemia    Hypertension    Pericardial effusion    a. 08/2018 post-op -->s/p window.    Assessment: Patient presenting with CC of chest pain x 2 weeks, trop elevated 39 > 52. Patient on warfarin PTA, taking 7.5mg  all days except Mon/Fri taking 5mg . INR today subtherapeutic at 2.4 (goal 2.5-3.5). Last dose of warfarin 3/23 @  18:00. HgB and PLTs stable.   Pharmacy consulted to dose heparin.   Goal of Therapy:  Heparin level 0.3-0.7 units/ml Monitor platelets by anticoagulation protocol: Yes   Plan:  No bolus given recent warfarin intake.  Start heparin infusion at 1000 units/hr Check anti-Xa level in 6 hours and daily while on heparin Continue to monitor H&H and platelets F/u plans for potential cath to determine when to resume warfarin   Estill Batten, PharmD, BCCCP  08/16/2023,7:53 PM

## 2023-08-16 NOTE — Progress Notes (Signed)
 The patient is admitted from ED to 4 E 14. A & O x 4. He was oriented to his room,staff and call bed. Patient and mom voice no concern. Will continue to monitor.

## 2023-08-16 NOTE — ED Triage Notes (Signed)
 Pt c/o int non radiating left sided chest [id number]. Pt denies N/V, SOB

## 2023-08-16 NOTE — H&P (Signed)
 Cardiology Admission History and Physical   Patient ID: Henry Duncan MRN: [id number]; DOB: Sep 14, 1990   Admission date: 08/16/2023  PCP:  Reubin Milan, MD   [facility] Providers Cardiologist:  Debbe Odea, MD        Chief Complaint:  " I am experiencing chest pain"  Patient Profile:   Henry Duncan is a 33 y.o. 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 Jude's mechanical valve, developed pericardial effusion postoperatively and underwent pericardial window 08/2018), mixed hyperlipidemia  who is being seen 08/16/2023 for the evaluation of Henry Kingsley,DO.  History of Present Illness:   Henry Duncan follows with our group and sees Dr.Brian Agbor-Etang and was last seen on 08/09/2023 for intermittent chest pain. At that visit his Imdur was increased to 90 mg daily.  He tells me that despite the increase of the Imdur he continues to have chest discomfort.  He notes that this overall has been going on for 2 to 3 weeks.  He describes this as  a sharp left-sided chest pain. He states is nonradiating. Reports some mild nausea, denies any vomiting, shortness of breath or diaphoresis. He denies any lower extremity swelling. He states he has been compliant on his medication.  His mom who is in the room reports that the patient has had intermittent/chronic cough over 6 to 8 weeks now.    No other complaints at this time.  Past Medical History:  Diagnosis Date   Allergy    Anxiety    Aortic aneurysm (HCC) 09/16/2018   a. 08/2018 Asc Ao/root aneuryms w/ dissection-->s/p Bentall w/ 26mm Hemashield graft, 23mm SJM AVR, and reimplantation of cor arteries; b. 04/2021 CTA Chest: stable post-op changes.   CAD (coronary artery disease)    a. 08/2018 Cor CTA: Ca2+ 99th%'ile (476). Calcified plaque in mLAD. Nl LM, LCX, RCA. S/p  reimplantation of coronary arteries in setting of Bentall; b. 05/2021 Cor CTA: LAD >66m (nl FFRct); c. 05/2021 Cath: LM nl, LAD 40p, 68m, LCX/OM1 min irregs, RCA nl->Med rx.   H/O mechanical aortic valve replacement    a. 08/2018 in setting of bicuspid AoV and Asc Ao dissection-->5mm SJM mech AVR; b. 03/2020 Echo: EF 55-60%. No rwma. Mild LVH. Nl RV fxn. Nl functioning AVR.   Hyperlipidemia    Hypertension    Pericardial effusion    a. 08/2018 post-op -->s/p window.    Past Surgical History:  Procedure Laterality Date   BENTALL PROCEDURE N/A 09/19/2018   Procedure: BENTALL PROCEDURE WITH 23 ST. JUDE GRAFT CONDUIT.;  Surgeon: Alleen Borne, MD;  Location: [facility] OR;  Service: Open Heart Surgery;  Laterality: N/A;   CARDIAC SURGERY     CLEFT LIP REPAIR     CORONARY ANGIOGRAPHY Left 06/13/2021   Procedure: CORONARY ANGIOGRAPHY (CATH LAB);  Surgeon: Iran Ouch, MD;  Location: [facility] INVASIVE CV LAB;  Service: Cardiovascular;  Laterality: Left;   CORONARY PRESSURE/FFR WITH 3D MAPPING N/A 06/13/2021   Procedure: Coronary Pressure Wire, IFR;  Surgeon: Iran Ouch, MD;  Location: [facility] INVASIVE CV LAB;  Service: Cardiovascular;  Laterality: N/A;   ESOPHAGOGASTRODUODENOSCOPY (EGD) WITH PROPOFOL N/A 05/26/2022   Procedure: ESOPHAGOGASTRODUODENOSCOPY (EGD) WITH PROPOFOL;  Surgeon: Toney Reil, MD;  Location: [facility] ENDOSCOPY;  Service: Gastroenterology;  Laterality: N/A;   SUBXYPHOID PERICARDIAL WINDOW N/A 09/26/2018   Procedure: SUBXYPHOID PERICARDIAL WINDOW;  Surgeon: Alleen Borne, MD;  Location: [facility] OR;  Service: Thoracic;  Laterality: N/A;   TEE WITHOUT CARDIOVERSION N/A 09/19/2018   Procedure: TRANSESOPHAGEAL ECHOCARDIOGRAM (TEE);  Surgeon: Alleen Borne, MD;  Location: [facility] OR;  Service: Open Heart Surgery;  Laterality: N/A;   WISDOM TOOTH EXTRACTION       Medications Prior to Admission: Prior to Admission medications   Medication Sig Start Date End Date Taking? Authorizing  Provider  ALPRAZolam Prudy Feeler) 0.5 MG tablet TAKE ONE TABLET BY MOUTH TWICE A DAY AS NEEDED FOR ANXIETY 07/28/23   Reubin Milan, MD  atorvastatin (LIPITOR) 80 MG tablet Take 1 tablet (80 mg total) by mouth daily. 04/07/23 04/01/24  Debbe Odea, MD  Bempedoic Acid-Ezetimibe (NEXLIZET) 180-10 MG TABS Take 1 tablet by mouth daily. 07/30/23   Debbe Odea, MD  bimekizumab-bkzx [facility]) 160 MG/ML pen Inject into the skin every 30 (thirty) days.    [provider]  busPIRone (BUSPAR) 5 MG tablet TAKE ONE TABLET BY MOUTH TWICE A DAY 03/07/23   Reubin Milan, MD  cetirizine (ZYRTEC) 10 MG tablet Take 10 mg by mouth daily as needed for allergies.    [provider]  clobetasol cream (TEMOVATE) 0.05 % Apply 1 application topically 2 (two) times daily. To external ear canals 09/03/20   Reubin Milan, MD  fenofibrate (TRICOR) 145 MG tablet Take 1 tablet (145 mg total) by mouth daily. PLEASE CALL OFFICE TO SCHEDULE APPOINTMENT PRIOR TO NEXT REFILL 06/01/23   Debbe Odea, MD  isosorbide mononitrate (IMDUR) 30 MG 24 hr tablet Take 2 tablets (60 mg total) by mouth every morning AND 1 tablet (30 mg total) at bedtime. 08/09/23 11/07/23  Debbe Odea, MD  methocarbamol (ROBAXIN) 500 MG tablet Take 1 tablet (500 mg total) by mouth at bedtime. 02/25/22   Reubin Milan, MD  omeprazole (PRILOSEC) 40 MG capsule Take 1 capsule (40 mg total) by mouth daily. 02/10/23 02/05/24  Celso Amy, PA-C  oxyCODONE (OXY IR/ROXICODONE) 5 MG immediate release tablet Take 1 tablet (5 mg total) by mouth every 6 (six) hours as needed. 06/13/23   Tommi Rumps, PA-C  traZODone (DESYREL) 50 MG tablet Take 0.5-1 tablets (25-50 mg total) by mouth at bedtime as needed for sleep. 08/03/23   Reubin Milan, MD  Vitamin D, Ergocalciferol, (DRISDOL) 1.25 MG (50000 UNIT) CAPS capsule Take 50,000 Units by mouth once a week. 01/28/23   [provider]  warfarin (COUMADIN) 5 MG tablet Take 1  tablet to 1.5 tablets by mouth daily or as directed by the Anticoagulation Clinic. 07/05/23   Debbe Odea, MD  ZORYVE 0.3 % CREA  01/08/22   [provider]     Allergies:   No Known Allergies  Social History:   Social History   Socioeconomic History   Marital status: Married    Spouse name: Not on file   Number of children: 0   Years of education: Not on file   Highest education level: 12th grade  Occupational History   Not on file  Tobacco Use   Smoking status: Never   Smokeless tobacco: Never  Vaping Use   Vaping status: Former  Substance and Sexual Activity   Alcohol use: Not Currently   Drug use: Never   Sexual activity: Yes    Partners: Female    Birth control/protection: Condom  Other Topics Concern   Not on file  Social History Narrative   Not on file   Social Drivers of Health   Financial Resource Strain:  Low Risk  (08/03/2023)   Overall Financial Resource Strain (CARDIA)    Difficulty of Paying Living Expenses: Not very hard  Food Insecurity: No Food Insecurity (08/03/2023)   Hunger Vital Sign    Worried About Running Out of Food in the Last Year: Never true    Ran Out of Food in the Last Year: Never true  Transportation Needs: No Transportation Needs (08/03/2023)   PRAPARE - Administrator, Civil Service (Medical): No    Lack of Transportation (Non-Medical): No  Physical Activity: Insufficiently Active (08/03/2023)   Exercise Vital Sign    Days of Exercise per Week: 5 days    Minutes of Exercise per Session: 20 min  Stress: Stress Concern Present (08/03/2023)   [employer] of Occupational Health - Occupational Stress Questionnaire    Feeling of Stress : Rather much  Social Connections: Socially Integrated (08/03/2023)   Social Connection and Isolation Panel [NHANES]    Frequency of Communication with Friends and Family: More than three times a week    Frequency of Social Gatherings with Friends and Family: Twice a week     Attends Religious Services: More than 4 times per year    Active Member of [employer] or Organizations: Yes    Attends Engineer, structural: More than 4 times per year    Marital Status: Married  Catering manager Violence: Not At Risk (09/30/2021)   Humiliation, Afraid, Rape, and Kick questionnaire    Fear of Current or Ex-Partner: No    Emotionally Abused: No    Physically Abused: No    Sexually Abused: No    Family History:   The patient's family history includes Anxiety disorder in his brother and father; Depression in his brother; Diabetes in his father; Heart attack in his mother.    ROS:  Please see the history of present illness.  Report  chest pain .  All other ROS reviewed and negative.     Physical Exam/Data:   Vitals:   08/16/23 1615 08/16/23 1700 08/16/23 1715 08/16/23 1900  BP: 110/76 118/85 112/74 102/68  Pulse: 63 66 66 62  Resp: 12 19 15 11   Temp:      SpO2: 99% 98% 94% 98%  Weight:      Height:       No intake or output data in the 24 hours ending 08/16/23 1954    08/16/2023    2:46 PM 08/09/2023   11:48 AM 08/03/2023    2:49 PM  Last 3 Weights  Weight (lbs) 178 lb 9.2 oz 178 lb 9.6 oz 177 lb 2 oz  Weight (kg) 81 kg 81.012 kg 80.343 kg     Body mass index is 27.97 kg/m.  General:  Well nourished, well developed, in no acute distress HEENT: normal Neck: no JVD Vascular: No carotid bruits; Distal pulses 2+ bilaterally   Cardiac:  normal S1, S2; RRR; no murmur  Lungs:  clear to auscultation bilaterally, no wheezing, rhonchi or rales  Abd: soft, nontender, no hepatomegaly  Ext: no edema Musculoskeletal:  No deformities, BUE and BLE strength normal and equal Skin: warm and dry  Neuro:  CNs 2-12 intact, no focal abnormalities noted Psych:  Normal affect    EKG:  The ECG that was done  was personally reviewed and demonstrates sinus rhythm  Relevant CV Studies:   Laboratory Data:  High Sensitivity Troponin:   Recent Labs  Lab 08/16/23 1448  08/16/23 1645  TROPONINIHS 39* 52*  Chemistry Recent Labs  Lab 08/16/23 1450  NA 136  K 4.0  CL 103  CO2 23  GLUCOSE 102*  BUN 12  CREATININE 0.85  CALCIUM 9.2  GFRNONAA >60  ANIONGAP 10    Recent Labs  Lab 08/16/23 1450  PROT 6.4*  ALBUMIN 4.2  AST 65*  ALT 66*  ALKPHOS 41  BILITOT 1.0   Lipids No results for input(s): "CHOL", "TRIG", "HDL", "LABVLDL", "LDLCALC", "CHOLHDL" in the last 168 hours. Hematology Recent Labs  Lab 08/16/23 1448  WBC 7.1  RBC 4.55  HGB 14.2  HCT 42.2  MCV 92.7  MCH 31.2  MCHC 33.6  RDW 13.1  PLT 212   Thyroid No results for input(s): "TSH", "FREET4" in the last 168 hours. BNPNo results for input(s): "BNP", "PROBNP" in the last 168 hours.  DDimer No results for input(s): "DDIMER" in the last 168 hours.   Radiology/Studies:  DG Chest 2 View Result Date: 08/16/2023 CLINICAL DATA:  Chest pain EXAM: CHEST - 2 VIEW COMPARISON:  06/13/2023 FINDINGS: Previous median sternotomy and aortic valve replacement. Stable cardiac silhouette and vascularity. Negative for edema, acute CHF, pneumonia, collapse or consolidation. No large effusion or pneumothorax. Trachea midline. No osseous abnormality. IMPRESSION: 1. Previous median sternotomy and aortic valve replacement. 2. No acute chest process. Electronically Signed   By: Judie Petit.  Shick M.D.   On: 08/16/2023 15:43     Assessment and Plan:   Coronary artery disease with intermittent chest pain and mild troponin elevation Status post mechanical valve Aneurysm of the ascending aorta Hyperlipidemia  His intermittent chest pain is atypical but concerning given his mildly troponin elevation.  He also does have a mixed picture with recent coughing/likely viral prodrome.  For now I am going to start the patient on heparin drip, continue his aspirin 81 mg daily, continue his lipid-lowering agent which includes atorvastatin 80 mg daily, bempedoic acid/Zetia 180 mg-10 milligrams, continues Imdur.  He  will benefit from a repeat echocardiogram which we will order. Certainly if his troponin continues to trend he will benefit from a heart catheterization we will keep the patient n.p.o. for now.    With his recent viral prodrome I will get ESR and CRP for completeness she is making sure that his atypical symptoms is not due to pericarditis.  DVT prophylaxis with heparin drip.  Risk Assessment/Risk Scores:       Code Status: Full Code  Severity of Illness: The appropriate patient status for this patient is OBSERVATION. Observation status is judged to be reasonable and necessary in order to provide the required intensity of service to ensure the patient's safety. The patient's presenting symptoms, physical exam findings, and initial radiographic and laboratory data in the context of their medical condition is felt to place them at decreased risk for further clinical deterioration. Furthermore, it is anticipated that the patient will be medically stable for discharge from the hospital within 2 midnights of admission.    For questions or updates, please contact [facility] Please consult [email or web address] for contact info under     Osvaldo Shipper, DO  08/16/2023 7:54 PM

## 2023-08-17 ENCOUNTER — Encounter (HOSPITAL_COMMUNITY)
Admission: EM | Disposition: A | Discharge status: Home / Self Care | Payer: Self-pay | Source: Home / Self Care | Attending: Emergency Medicine

## 2023-08-17 ENCOUNTER — Other Ambulatory Visit: Payer: Self-pay

## 2023-08-17 ENCOUNTER — Observation Stay (HOSPITAL_BASED_OUTPATIENT_CLINIC_OR_DEPARTMENT_OTHER): Payer: PRIVATE HEALTH INSURANCE

## 2023-08-17 DIAGNOSIS — I2510 Atherosclerotic heart disease of native coronary artery without angina pectoris: Secondary | ICD-10-CM

## 2023-08-17 DIAGNOSIS — R079 Chest pain, unspecified: Secondary | ICD-10-CM | POA: Diagnosis not present

## 2023-08-17 DIAGNOSIS — R7989 Other specified abnormal findings of blood chemistry: Secondary | ICD-10-CM

## 2023-08-17 HISTORY — PX: CORONARY ANGIOGRAPHY: CATH118303

## 2023-08-17 LAB — ECHOCARDIOGRAM COMPLETE
AR max vel: 1.12 cm2
AV Area VTI: 1.26 cm2
AV Area mean vel: 1.26 cm2
AV Mean grad: 22 mmHg
AV Peak grad: 38.9 mmHg
Ao pk vel: 3.12 m/s
Area-P 1/2: 2.79 cm2
Height: 67 in
MV VTI: 2.69 cm2
S' Lateral: 3.2 cm
Weight: 2792 [oz_av]

## 2023-08-17 LAB — BASIC METABOLIC PANEL
Anion gap: 5 (ref 5–15)
BUN: 14 mg/dL (ref 6–20)
CO2: 30 mmol/L (ref 22–32)
Calcium: 9 mg/dL (ref 8.9–10.3)
Chloride: 102 mmol/L (ref 98–111)
Creatinine, Ser: 0.74 mg/dL (ref 0.61–1.24)
GFR, Estimated: >60 mL/min (ref >60)
Glucose, Bld: 93 mg/dL (ref 70–99)
Potassium: 3.9 mmol/L (ref 3.5–5.1)
Sodium: 137 mmol/L (ref 135–145)

## 2023-08-17 LAB — CBC
HCT: 43.2 % (ref 39.0–52.0)
Hemoglobin: 14.5 g/dL (ref 13.0–17.0)
MCH: 30.9 pg (ref 26.0–34.0)
MCHC: 33.6 g/dL (ref 30.0–36.0)
MCV: 92.1 fL (ref 80.0–100.0)
Platelets: 185 10*3/uL (ref 150–400)
RBC: 4.69 MIL/uL (ref 4.22–5.81)
RDW: 13.3 % (ref 11.5–15.5)
WBC: 4 10*3/uL (ref 4.0–10.5)
nRBC: 0 % (ref 0.0–0.2)

## 2023-08-17 LAB — PROTIME-INR
INR: 1.6 — ABNORMAL HIGH (ref 0.8–1.2)
Prothrombin Time: 19.6 s — ABNORMAL HIGH (ref 11.4–15.2)

## 2023-08-17 LAB — LIPID PANEL
Cholesterol: 129 mg/dL (ref 0–200)
HDL: 29 mg/dL — ABNORMAL LOW (ref >40)
LDL Cholesterol: 75 mg/dL (ref 0–99)
Total CHOL/HDL Ratio: 4.4 ratio
Triglycerides: 127 mg/dL (ref <150)
VLDL: 25 mg/dL (ref 0–40)

## 2023-08-17 LAB — TSH: TSH: 1.517 u[IU]/mL (ref 0.350–4.500)

## 2023-08-17 LAB — HEMOGLOBIN A1C
Hgb A1c MFr Bld: 5.4 % (ref 4.8–5.6)
Mean Plasma Glucose: 108.28 mg/dL

## 2023-08-17 LAB — TROPONIN I (HIGH SENSITIVITY): Troponin I (High Sensitivity): 7 ng/L (ref <18)

## 2023-08-17 LAB — HEPARIN LEVEL (UNFRACTIONATED): Heparin Unfractionated: 0.17 [IU]/mL — ABNORMAL LOW (ref 0.30–0.70)

## 2023-08-17 SURGERY — CORONARY ANGIOGRAPHY (CATH LAB)
Anesthesia: LOCAL

## 2023-08-17 MED ORDER — WARFARIN SODIUM 5 MG PO TABS
7.5000 mg | ORAL_TABLET | Freq: Once | ORAL | Status: AC
  Administered 2023-08-17: 7.5 mg via ORAL
  Filled 2023-08-17: qty 1

## 2023-08-17 MED ORDER — SODIUM CHLORIDE 0.9 % WEIGHT BASED INFUSION: 3.0000 mL/kg/h | INTRAVENOUS | Status: DC

## 2023-08-17 MED ORDER — SODIUM CHLORIDE 0.9% FLUSH: 3.0000 mL | INTRAVENOUS | Status: DC | PRN

## 2023-08-17 MED ORDER — LIDOCAINE HCL (PF) 1 % IJ SOLN
INTRAMUSCULAR | Status: AC
  Filled 2023-08-17: qty 30

## 2023-08-17 MED ORDER — EZETIMIBE 10 MG PO TABS
10.0000 mg | ORAL_TABLET | Freq: Every day | ORAL | Status: DC
  Administered 2023-08-17 – 2023-08-18 (×2): 10 mg via ORAL
  Filled 2023-08-17 (×2): qty 1

## 2023-08-17 MED ORDER — FENTANYL CITRATE (PF) 100 MCG/2ML IJ SOLN
INTRAMUSCULAR | Status: AC
  Filled 2023-08-17: qty 2

## 2023-08-17 MED ORDER — LABETALOL HCL 5 MG/ML IV SOLN: 10.0000 mg | INTRAVENOUS | Status: AC | PRN

## 2023-08-17 MED ORDER — MIDAZOLAM HCL 2 MG/2ML IJ SOLN
INTRAMUSCULAR | Status: DC | PRN
  Administered 2023-08-17: 1 mg via INTRAVENOUS

## 2023-08-17 MED ORDER — IOHEXOL 350 MG/ML SOLN
INTRAVENOUS | Status: DC | PRN
  Administered 2023-08-17: 50 mL

## 2023-08-17 MED ORDER — SODIUM CHLORIDE 0.9 % IV SOLN: INTRAVENOUS | Status: AC

## 2023-08-17 MED ORDER — LIDOCAINE HCL (PF) 1 % IJ SOLN
INTRAMUSCULAR | Status: DC | PRN
  Administered 2023-08-17: 15 mL via INTRADERMAL

## 2023-08-17 MED ORDER — FENTANYL CITRATE (PF) 100 MCG/2ML IJ SOLN
INTRAMUSCULAR | Status: DC | PRN
  Administered 2023-08-17: 25 ug via INTRAVENOUS

## 2023-08-17 MED ORDER — WARFARIN - PHARMACIST DOSING INPATIENT: Freq: Every day | Status: DC

## 2023-08-17 MED ORDER — SODIUM CHLORIDE 0.9 % IV SOLN
INTRAVENOUS | Status: AC | PRN
  Administered 2023-08-17: 10 mL/h via INTRAVENOUS

## 2023-08-17 MED ORDER — ATORVASTATIN CALCIUM 80 MG PO TABS: 80.0000 mg | ORAL_TABLET | Freq: Every day | ORAL | 0 refills | Status: DC

## 2023-08-17 MED ORDER — SODIUM CHLORIDE 0.9 % WEIGHT BASED INFUSION: 1.0000 mL/kg/h | INTRAVENOUS | Status: DC

## 2023-08-17 MED ORDER — SODIUM CHLORIDE 0.9 % IV SOLN: 250.0000 mL | INTRAVENOUS | Status: DC | PRN

## 2023-08-17 MED ORDER — HYDROMORPHONE HCL 1 MG/ML IJ SOLN
0.5000 mg | Freq: Once | INTRAMUSCULAR | Status: AC
  Administered 2023-08-17: 0.5 mg via INTRAVENOUS
  Filled 2023-08-17: qty 0.5

## 2023-08-17 MED ORDER — HYDRALAZINE HCL 20 MG/ML IJ SOLN: 10.0000 mg | INTRAMUSCULAR | Status: AC | PRN

## 2023-08-17 MED ORDER — HEPARIN (PORCINE) 25000 UT/250ML-% IV SOLN
1200.0000 [IU]/h | INTRAVENOUS | Status: DC
  Administered 2023-08-17: 1200 [IU]/h via INTRAVENOUS
  Filled 2023-08-17: qty 250

## 2023-08-17 MED ORDER — MIDAZOLAM HCL 2 MG/2ML IJ SOLN
INTRAMUSCULAR | Status: AC
  Filled 2023-08-17: qty 2

## 2023-08-17 MED ORDER — HEPARIN (PORCINE) IN NACL 1000-0.9 UT/500ML-% IV SOLN
INTRAVENOUS | Status: DC | PRN
Start: 2023-08-17 — End: 2023-08-17
  Administered 2023-08-17: 500 mL

## 2023-08-17 MED ORDER — SODIUM CHLORIDE 0.9% FLUSH
3.0000 mL | Freq: Two times a day (BID) | INTRAVENOUS | Status: DC
  Administered 2023-08-18: 3 mL via INTRAVENOUS

## 2023-08-17 SURGICAL SUPPLY — 9 items
CATH INFINITI 5FR MULTPACK ANG (CATHETERS) IMPLANT
CLOSURE MYNX CONTROL 5F (Vascular Products) IMPLANT
KIT SINGLE USE MANIFOLD (KITS) IMPLANT
PACK CARDIAC CATHETERIZATION (CUSTOM PROCEDURE TRAY) ×1 IMPLANT
SET ATX-X65L (MISCELLANEOUS) IMPLANT
SHEATH PINNACLE 5F 10CM (SHEATH) IMPLANT
WIRE EMERALD 3MM-J .035X150CM (WIRE) IMPLANT
WIRE MICRO SET 5FR 12 (WIRE) IMPLANT
WIRE MICRO SET SILHO 5FR 7 (SHEATH) IMPLANT

## 2023-08-17 NOTE — Interval H&P Note (Signed)
 History and Physical Interval Note:  08/17/2023 2:09 PM  Henry Duncan  has presented today for surgery, with the diagnosis of chest pain.  The various methods of treatment have been discussed with the patient and family. After consideration of risks, benefits and other options for treatment, the patient has consented to  Procedure(s): LEFT HEART CATH AND CORONARY ANGIOGRAPHY (N/A) as a surgical intervention.  The patient's history has been reviewed, patient examined, no change in status, stable for surgery.  I have reviewed the patient's chart and labs.  Questions were answered to the patient's satisfaction.    Cath Lab Visit (complete for each Cath Lab visit)  Clinical Evaluation Leading to the Procedure:   ACS: Yes.    Non-ACS:    Anginal Classification: CCS III  Anti-ischemic medical therapy: Minimal Therapy (1 class of medications)  Non-Invasive Test Results: No non-invasive testing performed  Prior CABG: No previous CABG    Verne Carrow

## 2023-08-17 NOTE — Progress Notes (Signed)
 PHARMACY - ANTICOAGULATION CONSULT NOTE  Pharmacy Consult for heparin  Indication: chest pain/ACS and warfarin PTA for  aortic mechanical valve   No Known Allergies  Patient Measurements: Height: 5\' 7"  (170.2 cm) Weight: 79.2 kg (174 lb 8 oz) IBW/kg (Calculated) : 66.1 Heparin Dosing Weight: 81kg   Vital Signs: Temp: 97.6 F (36.4 C) (03/25 0729) Temp Source: Oral (03/25 0729) BP: 107/75 (03/25 0729) Pulse Rate: 77 (03/25 0729)  Labs: Recent Labs    08/16/23 1448 08/16/23 1450 08/16/23 1645 08/16/23 1732 08/17/23 0752  HGB 14.2  --   --   --  14.5  HCT 42.2  --   --   --  43.2  PLT 212  --   --   --  185  LABPROT  --   --   --  26.2*  --   INR  --   --   --  2.4*  --   HEPARINUNFRC  --   --   --   --  0.17*  CREATININE  --  0.85  --   --  0.74  TROPONINIHS 39*  --  52*  --   --     Estimated Creatinine Clearance: 123.9 mL/min (by C-G formula based on SCr of 0.74 mg/dL).   Medical History: Past Medical History:  Diagnosis Date   Allergy    Anxiety    Aortic aneurysm (HCC) 09/16/2018   a. 08/2018 Asc Ao/root aneuryms w/ dissection-->s/p Bentall w/ 26mm Hemashield graft, 23mm SJM AVR, and reimplantation of cor arteries; b. 04/2021 CTA Chest: stable post-op changes.   CAD (coronary artery disease)    a. 08/2018 Cor CTA: Ca2+ 99th%'ile (476). Calcified plaque in mLAD. Nl LM, LCX, RCA. S/p reimplantation of coronary arteries in setting of Bentall; b. 05/2021 Cor CTA: LAD >74m (nl FFRct); c. 05/2021 Cath: LM nl, LAD 40p, 25m, LCX/OM1 min irregs, RCA nl->Med rx.   H/O mechanical aortic valve replacement    a. 08/2018 in setting of bicuspid AoV and Asc Ao dissection-->20mm SJM mech AVR; b. 03/2020 Echo: EF 55-60%. No rwma. Mild LVH. Nl RV fxn. Nl functioning AVR.   Hyperlipidemia    Hypertension    Pericardial effusion    a. 08/2018 post-op -->s/p window.    Assessment: Patient presenting with CC of chest pain x 2 weeks, trop elevated 39 > 52. Patient on warfarin PTA for  hx mAVR, taking 7.5mg  all days except Mon/Fri taking 5mg . INR today subtherapeutic at 2.4 (goal 2.5-3.5). Last dose of warfarin 3/23 @ 18:00. Pharmacy consulted to dose heparin.   Heparin level is subtherapeutic at 0.17, CBC ok. No infusion issues.  Goal of Therapy:  Heparin level 0.3-0.7 units/ml Monitor platelets by anticoagulation protocol: Yes   Plan:  Increase heparin to 1200 units/h Recheck heparin level in 6h  Fredonia Highland, PharmD, [place], [facility] Clinical Pharmacist [phone] Please check AMION for all [facility] Pharmacy numbers 08/17/2023

## 2023-08-17 NOTE — Progress Notes (Signed)
 PHARMACY - ANTICOAGULATION CONSULT NOTE  Pharmacy Consult for heparin and warfarin Indication: chest pain/ACS and warfarin PTA for  aortic mechanical valve   No Known Allergies  Patient Measurements: Height: 5\' 7"  (170.2 cm) Weight: 79.2 kg (174 lb 8 oz) IBW/kg (Calculated) : 66.1 Heparin Dosing Weight: 81kg   Vital Signs: Temp: 97.6 F (36.4 C) (03/25 1559) Temp Source: Axillary (03/25 1559) BP: 89/44 (03/25 1600) Pulse Rate: 58 (03/25 1600)  Labs: Recent Labs    08/16/23 1448 08/16/23 1450 08/16/23 1645 08/16/23 1732 08/17/23 0752  HGB 14.2  --   --   --  14.5  HCT 42.2  --   --   --  43.2  PLT 212  --   --   --  185  LABPROT  --   --   --  26.2* 19.6*  INR  --   --   --  2.4* 1.6*  HEPARINUNFRC  --   --   --   --  0.17*  CREATININE  --  0.85  --   --  0.74  TROPONINIHS 39*  --  52*  --  7    Estimated Creatinine Clearance: 123.9 mL/min (by C-G formula based on SCr of 0.74 mg/dL).  Assessment: Patient presenting with CC of chest pain x 2 weeks, trop elevated 39 > 52. Patient on warfarin PTA for hx mAVR, taking 7.5mg  all days except Mon/Fri taking 5mg . INR today subtherapeutic at 2.4 (goal 2.5-3.5). Last dose of warfarin 3/23 @ 18:00. Pharmacy consulted to dose heparin pre cath.   Pt now s/p cath - plan for medical management. Consult to restart heparin 4 hours post sheath removal (start at 7pm) and okay to restart coumadin today too. INR down to 1.6 today.  Goal of Therapy:  Heparin level 0.3-0.7 units/ml Monitor platelets by anticoagulation protocol: Yes INR 2.5-3.5   Plan:  Warfarin 7.5mg  po tonight At 1900, restart heparin 1200 units/hr F/u 8hr heparin level Daily heparin level, CBC, and INR  Christoper Fabian, PharmD, BCPS Please see amion for complete clinical pharmacist phone list 08/17/2023

## 2023-08-17 NOTE — Plan of Care (Signed)
  Problem: Activity: Goal: Ability to tolerate increased activity will improve Outcome: Progressing   Problem: Education: Goal: Knowledge of General Education information will improve Description: Including pain rating scale, medication(s)/side effects and non-pharmacologic comfort measures Outcome: Progressing   Problem: Activity: Goal: Risk for activity intolerance will decrease Outcome: Progressing   

## 2023-08-17 NOTE — Plan of Care (Signed)
  Problem: Activity: Goal: Ability to tolerate increased activity will improve Outcome: Progressing   Problem: Education: Goal: Knowledge of General Education information will improve Description: Including pain rating scale, medication(s)/side effects and non-pharmacologic comfort measures Outcome: Progressing   Problem: Clinical Measurements: Goal: Ability to maintain clinical measurements within normal limits will improve Outcome: Progressing Goal: Will remain free from infection Outcome: Progressing Goal: Respiratory complications will improve Outcome: Progressing   Problem: Activity: Goal: Risk for activity intolerance will decrease Outcome: Progressing   Problem: Elimination: Goal: Will not experience complications related to urinary retention Outcome: Progressing   Problem: Clinical Measurements: Goal: Cardiovascular complication will be avoided Outcome: Not Progressing   Problem: Pain Managment: Goal: General experience of comfort will improve and/or be controlled Outcome: Not Progressing

## 2023-08-17 NOTE — Progress Notes (Addendum)
 Patient Name: Henry Duncan Date of Encounter: 08/17/2023 [facility] Cardiologist: Debbe Odea, MD   Interval Summary  .    Still with ongoing chest pain this morning, feels sharp in nature   Vital Signs .    Vitals:   08/16/23 2347 08/17/23 0613 08/17/23 0700 08/17/23 0729  BP:    107/75  Pulse:    77  Resp: 17 15 18 15   Temp:    97.6 F (36.4 C)  TempSrc:    Oral  SpO2:      Weight:      Height:        Intake/Output Summary (Last 24 hours) at 08/17/2023 0851 Last data filed at 08/17/2023 0700 Gross per 24 hour  Intake 76.59 ml  Output --  Net 76.59 ml      08/16/2023    9:15 PM 08/16/2023    2:46 PM 08/09/2023   11:48 AM  Last 3 Weights  Weight (lbs) 174 lb 8 oz 178 lb 9.2 oz 178 lb 9.6 oz  Weight (kg) 79.153 kg 81 kg 81.012 kg      Telemetry/ECG    Sinus Rhythm - Personally Reviewed  Physical Exam .    GEN: No acute distress.   Neck: No JVD Cardiac: RRR, + systolic murmur with mechanical valve click, no rubs, or gallops.  Respiratory: Clear to auscultation bilaterally. GI: Soft, nontender, non-distended  MS: No edema  Assessment & Plan .     33 y.o. 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 Jude's mechanical valve, developed pericardial effusion postoperatively and underwent pericardial window 08/2018), mixed hyperlipidemia  who was seen 08/16/2023 for the evaluation of [name],DO.   Chest pain CAD Aortic aneurysm s/p Bentall w/ mechanical valve '20 Mildly elevated troponin -- presented with sharp left sided chest pain worse over the past couple of days. Went for a jog on hi lunch break and developed worsening symptoms -- hsTn 39>>52, EKG showed sinus rhythm TWI in lead III, aVR -- will with ongoing chest pain this morning, did have some improvement with dilaudid last evening.  -- CRP, Sed rate  WNL -- will recheck troponin -- echo pending -- continue ASA, statin, Imdur. Coumadin held (INR 2.4 3/24), on IV heparin for now in the event he needs cardiac cath  HLD -- LDL 75, HDL 29 -- continue Zetia 10mg  daily, fenofibrate 160mg  daily, atorvastatin 80mg  daily, Nexlizet (PTA)   For questions or updates, please contact [facility] Please consult [email or web address] for contact info under   Signed, Laverda Page, NP   Patient seen, examined. Available data reviewed. Agree with findings, assessment, and plan as outlined by Laverda Page, NP.  Patient independently interviewed and examined.  He is alert, oriented, in no distress.  HEENT is normal, JVP is normal, lungs are clear bilaterally, heart is regular rate and rhythm with a 2/6 ejection murmur at the right upper sternal border and a normal mechanical A2, abdomen is soft and nontender, extremities have no edema, left radial pulses 2+.  Patient with chest pain syndrome, exacerbated by exercise.  Initially with low-level high-sensitivity troponin elevation of 39-52, now normalized this morning with high-sensitivity troponin of 7.  EKG is within normal limits with no significant ST/T wave changes.  Despite the patient's young age, he was noted to have a high coronary calcium score of 958 on his CTA study 2 years  ago.  He was suspected of having severe mid LAD stenosis, but cardiac catheterization at that time showed moderate 50% stenosis of the mid LAD evaluated with IFR which was nonischemic at 0.95.  He now presents with acute on chronic atypical chest pain.  His CRP and sed rate were both normal.  INR has trended down to 1.6.  Patient continuing to need narcotic analgesics for pain improvement.  Favor definitive evaluation with cardiac catheterization via either left radial or right femoral approach.  At the time of cardiac catheterization, I would like him to have fluoroscopic evaluation of his aortic valve leaflets as his mean  transvalvular gradient is mildly elevated at 22 mmHg by echo.  Will add him on for cardiac catheterization later today. I have reviewed the risks, indications, and alternatives to cardiac catheterization, possible angioplasty, and stenting with the patient. Risks include but are not limited to bleeding, infection, vascular injury, stroke, myocardial infection, arrhythmia, kidney injury, radiation-related injury in the case of prolonged fluoroscopy use, emergency cardiac surgery, and death. The patient understands the risks of serious complication is 1-2 in 1000 with diagnostic cardiac cath and 1-2% or less with angioplasty/stenting.    Tonny Bollman, M.D. 08/17/2023 12:18 PM

## 2023-08-17 NOTE — H&P (View-Only) (Signed)
 Patient Name: Henry Duncan Date of Encounter: 08/17/2023 [facility] Cardiologist: Debbe Odea, MD   Interval Summary  .    Still with ongoing chest pain this morning, feels sharp in nature   Vital Signs .    Vitals:   08/16/23 2347 08/17/23 0613 08/17/23 0700 08/17/23 0729  BP:    107/75  Pulse:    77  Resp: 17 15 18 15   Temp:    97.6 F (36.4 C)  TempSrc:    Oral  SpO2:      Weight:      Height:        Intake/Output Summary (Last 24 hours) at 08/17/2023 0851 Last data filed at 08/17/2023 0700 Gross per 24 hour  Intake 76.59 ml  Output --  Net 76.59 ml      08/16/2023    9:15 PM 08/16/2023    2:46 PM 08/09/2023   11:48 AM  Last 3 Weights  Weight (lbs) 174 lb 8 oz 178 lb 9.2 oz 178 lb 9.6 oz  Weight (kg) 79.153 kg 81 kg 81.012 kg      Telemetry/ECG    Sinus Rhythm - Personally Reviewed  Physical Exam .    GEN: No acute distress.   Neck: No JVD Cardiac: RRR, + systolic murmur with mechanical valve click, no rubs, or gallops.  Respiratory: Clear to auscultation bilaterally. GI: Soft, nontender, non-distended  MS: No edema  Assessment & Plan .     33 y.o. 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 mechanical valve, developed pericardial effusion postoperatively and underwent pericardial window 08/2018), mixed hyperlipidemia  who was seen 08/16/2023 for the evaluation of Henry Kingsley,DO.   Chest pain CAD Aortic aneurysm s/p Bentall w/ mechanical valve '20 Mildly elevated troponin -- presented with sharp left sided chest pain worse over the past couple of days. Went for a jog on hi lunch break and developed worsening symptoms -- hsTn 39>>52, EKG showed sinus rhythm TWI in lead III, aVR -- will with ongoing chest pain this morning, did have some improvement with dilaudid last evening.  -- CRP, Sed rate  WNL -- will recheck troponin -- echo pending -- continue ASA, statin, Imdur. Coumadin held (INR 2.4 3/24), on IV heparin for now in the event he needs cardiac cath  HLD -- LDL 75, HDL 29 -- continue Zetia 10mg  daily, fenofibrate 160mg  daily, atorvastatin 80mg  daily, Nexlizet (PTA)   For questions or updates, please contact [facility] Please consult [email or web address] for contact info under   Signed, Laverda Page, NP   Patient seen, examined. Available data reviewed. Agree with findings, assessment, and plan as outlined by Laverda Page, NP.  Patient independently interviewed and examined.  He is alert, oriented, in no distress.  HEENT is normal, JVP is normal, lungs are clear bilaterally, heart is regular rate and rhythm with a 2/6 ejection murmur at the right upper sternal border and a normal mechanical A2, abdomen is soft and nontender, extremities have no edema, left radial pulses 2+.  Patient with chest pain syndrome, exacerbated by exercise.  Initially with low-level high-sensitivity troponin elevation of 39-52, now normalized this morning with high-sensitivity troponin of 7.  EKG is within normal limits with no significant ST/T wave changes.  Despite the patient's young age, he was noted to have a high coronary calcium score of 958 on his CTA study 2 years  ago.  He was suspected of having severe mid LAD stenosis, but cardiac catheterization at that time showed moderate 50% stenosis of the mid LAD evaluated with IFR which was nonischemic at 0.95.  He now presents with acute on chronic atypical chest pain.  His CRP and sed rate were both normal.  INR has trended down to 1.6.  Patient continuing to need narcotic analgesics for pain improvement.  Favor definitive evaluation with cardiac catheterization via either left radial or right femoral approach.  At the time of cardiac catheterization, I would like him to have fluoroscopic evaluation of his aortic valve leaflets as his mean  transvalvular gradient is mildly elevated at 22 mmHg by echo.  Will add him on for cardiac catheterization later today. I have reviewed the risks, indications, and alternatives to cardiac catheterization, possible angioplasty, and stenting with the patient. Risks include but are not limited to bleeding, infection, vascular injury, stroke, myocardial infection, arrhythmia, kidney injury, radiation-related injury in the case of prolonged fluoroscopy use, emergency cardiac surgery, and death. The patient understands the risks of serious complication is 1-2 in 1000 with diagnostic cardiac cath and 1-2% or less with angioplasty/stenting.    Tonny Bollman, M.D. 08/17/2023 12:18 PM

## 2023-08-18 ENCOUNTER — Encounter: Payer: Self-pay | Admitting: Internal Medicine

## 2023-08-18 ENCOUNTER — Other Ambulatory Visit (HOSPITAL_COMMUNITY): Payer: Self-pay

## 2023-08-18 ENCOUNTER — Ambulatory Visit (INDEPENDENT_AMBULATORY_CARE_PROVIDER_SITE_OTHER): Payer: PRIVATE HEALTH INSURANCE | Admitting: Internal Medicine

## 2023-08-18 ENCOUNTER — Encounter (HOSPITAL_COMMUNITY): Payer: Self-pay | Admitting: Cardiovascular Disease

## 2023-08-18 VITALS — BP 122/74 | HR 126 | Ht 67.0 in | Wt 172.5 lb

## 2023-08-18 DIAGNOSIS — R7989 Other specified abnormal findings of blood chemistry: Secondary | ICD-10-CM | POA: Diagnosis not present

## 2023-08-18 DIAGNOSIS — R0789 Other chest pain: Secondary | ICD-10-CM | POA: Diagnosis not present

## 2023-08-18 LAB — BASIC METABOLIC PANEL
Anion gap: 9 (ref 5–15)
BUN: 13 mg/dL (ref 6–20)
CO2: 23 mmol/L (ref 22–32)
Calcium: 8.9 mg/dL (ref 8.9–10.3)
Chloride: 106 mmol/L (ref 98–111)
Creatinine, Ser: 0.77 mg/dL (ref 0.61–1.24)
GFR, Estimated: >60 mL/min (ref >60)
Glucose, Bld: 84 mg/dL (ref 70–99)
Potassium: 3.9 mmol/L (ref 3.5–5.1)
Sodium: 138 mmol/L (ref 135–145)

## 2023-08-18 LAB — PROTIME-INR
INR: 1.6 — ABNORMAL HIGH (ref 0.8–1.2)
Prothrombin Time: 19 s — ABNORMAL HIGH (ref 11.4–15.2)

## 2023-08-18 LAB — CBC
HCT: 46.5 % (ref 39.0–52.0)
Hemoglobin: 15.4 g/dL (ref 13.0–17.0)
MCH: 30.7 pg (ref 26.0–34.0)
MCHC: 33.1 g/dL (ref 30.0–36.0)
MCV: 92.8 fL (ref 80.0–100.0)
Platelets: 185 10*3/uL (ref 150–400)
RBC: 5.01 MIL/uL (ref 4.22–5.81)
RDW: 13.2 % (ref 11.5–15.5)
WBC: 4.5 10*3/uL (ref 4.0–10.5)
nRBC: 0 % (ref 0.0–0.2)

## 2023-08-18 LAB — HEPARIN LEVEL (UNFRACTIONATED): Heparin Unfractionated: 0.32 [IU]/mL (ref 0.30–0.70)

## 2023-08-18 LAB — LIPOPROTEIN A (LPA): Lipoprotein (a): 233.2 nmol/L — ABNORMAL HIGH (ref <75.0)

## 2023-08-18 MED ORDER — WARFARIN SODIUM 5 MG PO TABS: ORAL_TABLET | ORAL | Status: DC

## 2023-08-18 MED ORDER — WARFARIN SODIUM 5 MG PO TABS: 10.0000 mg | ORAL_TABLET | Freq: Once | ORAL | Status: DC

## 2023-08-18 MED ORDER — GABAPENTIN 100 MG PO CAPS: 100.0000 mg | ORAL_CAPSULE | Freq: Every day | ORAL | 0 refills | Status: DC

## 2023-08-18 NOTE — Progress Notes (Signed)
 Date:  08/18/2023   Name:  Henry Duncan   DOB:  1991-04-09   MRN:  [id number]   Chief Complaint: Anxiety (Patient said his anxiety is getting worse and was experiencing chest pain, went to ED on 08/15/2024, was told his heart was fine)  Chest Pain  This is a recurrent problem. The onset quality is undetermined. Episode frequency: daily or every several days. The pain is present in the lateral region. The pain is at a severity of 5/10. The pain is mild. The quality of the pain is described as stabbing. The pain does not radiate. Pertinent negatives include no dizziness, fever, headaches, palpitations or shortness of breath. Prior diagnostic workup includes cardiac catheterization (with no cause for chest pain noted).    Review of Systems  Constitutional:  Negative for chills, fatigue and fever.  HENT:  Negative for trouble swallowing.   Respiratory:  Negative for chest tightness and shortness of breath.   Cardiovascular:  Positive for chest pain. Negative for palpitations and leg swelling.  Neurological:  Negative for dizziness and headaches.  Psychiatric/Behavioral:  Negative for dysphoric mood and sleep disturbance. The patient is nervous/anxious.      Lab Results  Component Value Date   NA 138 08/18/2023   K 3.9 08/18/2023   CO2 23 08/18/2023   GLUCOSE 84 08/18/2023   BUN 13 08/18/2023   CREATININE 0.77 08/18/2023   CALCIUM 8.9 08/18/2023   EGFR 124 10/05/2022   GFRNONAA >60 08/18/2023   Lab Results  Component Value Date   CHOL 129 08/17/2023   HDL 29 (L) 08/17/2023   LDLCALC 75 08/17/2023   TRIG 127 08/17/2023   CHOLHDL 4.4 08/17/2023   Lab Results  Component Value Date   TSH 1.517 08/17/2023   Lab Results  Component Value Date   HGBA1C 5.4 08/17/2023   Lab Results  Component Value Date   WBC 4.5 08/18/2023   HGB 15.4 08/18/2023   HCT 46.5 08/18/2023   MCV 92.8 08/18/2023   PLT 185 08/18/2023   Lab Results  Component Value Date   ALT 66 (H)  08/16/2023   AST 65 (H) 08/16/2023   ALKPHOS 41 08/16/2023   BILITOT 1.0 08/16/2023   No results found for: "25OHVITD2", "25OHVITD3", "VD25OH"   Patient Active Problem List   Diagnosis Date Noted   Elevated troponin 08/17/2023   Left-sided chest wall pain 08/16/2023   Esophagitis, erosive 05/26/2022   Chronic GERD 05/26/2022   OSA (obstructive sleep apnea) 04/15/2022   Plaque psoriasis 04/14/2022   Mixed hyperlipidemia 09/09/2021   Onychodystrophy 06/25/2021   Neoplasm of uncertain behavior of skin 06/25/2021   Generalized anxiety disorder 06/25/2021   Abnormal cardiac CT angiography    Coronary artery disease involving native coronary artery of native heart    Warfarin anticoagulation 03/15/2019   Encounter for therapeutic drug monitoring 09/29/2018   Aortic valve prosthesis present 09/29/2018   S/P ascending aortic aneurysm repair 09/19/2018   Allergic rhinitis 09/06/2018   Hepatic steatosis 10/30/2016   Insomnia 11/29/2015   Migraine 11/29/2015    No Known Allergies  Past Surgical History:  Procedure Laterality Date   BENTALL PROCEDURE N/A 09/19/2018   Procedure: BENTALL PROCEDURE WITH 23 [employer]. JUDE GRAFT CONDUIT.;  Surgeon: Alleen Borne, MD;  Location: [facility] OR;  Service: Open Heart Surgery;  Laterality: N/A;   CARDIAC SURGERY     CLEFT LIP REPAIR     CORONARY ANGIOGRAPHY Left 06/13/2021   Procedure: CORONARY ANGIOGRAPHY (CATH LAB);  Surgeon: Iran Ouch, MD;  Location: [facility] INVASIVE CV LAB;  Service: Cardiovascular;  Laterality: Left;   CORONARY ANGIOGRAPHY N/A 08/17/2023   Procedure: CORONARY ANGIOGRAPHY;  Surgeon: Kathleene Hazel, MD;  Location: [facility] INVASIVE CV LAB;  Service: Cardiovascular;  Laterality: N/A;   CORONARY PRESSURE/FFR WITH 3D MAPPING N/A 06/13/2021   Procedure: Coronary Pressure Wire, IFR;  Surgeon: Iran Ouch, MD;  Location: [facility] INVASIVE CV LAB;  Service: Cardiovascular;  Laterality: N/A;   ESOPHAGOGASTRODUODENOSCOPY (EGD) WITH  PROPOFOL N/A 05/26/2022   Procedure: ESOPHAGOGASTRODUODENOSCOPY (EGD) WITH PROPOFOL;  Surgeon: Toney Reil, MD;  [facility] ENDOSCOPY;  Service: Gastroenterology;  Laterality: N/A;   SUBXYPHOID PERICARDIAL WINDOW N/A 09/26/2018   Procedure: SUBXYPHOID PERICARDIAL WINDOW;  Surgeon: Alleen Borne, MD;  Location: [facility] OR;  Service: Thoracic;  Laterality: N/A;   TEE WITHOUT CARDIOVERSION N/A 09/19/2018   Procedure: TRANSESOPHAGEAL ECHOCARDIOGRAM (TEE);  Surgeon: Alleen Borne, MD;  Location: [facility] OR;  Service: Open Heart Surgery;  Laterality: N/A;   WISDOM TOOTH EXTRACTION      Social History   Tobacco Use   Smoking status: Never   Smokeless tobacco: Never  Vaping Use   Vaping status: Former  Substance Use Topics   Alcohol use: Not Currently   Drug use: Never     Medication list has been reviewed and updated.  Current Meds  Medication Sig   ALPRAZolam (XANAX) 0.5 MG tablet TAKE ONE TABLET BY MOUTH TWICE A DAY AS NEEDED FOR ANXIETY   atorvastatin (LIPITOR) 80 MG tablet Take 1 tablet (80 mg total) by mouth daily.   Bempedoic Acid-Ezetimibe (NEXLIZET) 180-10 MG TABS Take 1 tablet by mouth daily.   bimekizumab-bkzx (BIMZELX) 160 MG/ML pen Inject into the skin every 30 (thirty) days.   busPIRone (BUSPAR) 5 MG tablet TAKE ONE TABLET BY MOUTH TWICE A DAY   cetirizine (ZYRTEC) 10 MG tablet Take 10 mg by mouth daily as needed for allergies.   fenofibrate (TRICOR) 145 MG tablet Take 1 tablet (145 mg total) by mouth daily. PLEASE CALL OFFICE TO SCHEDULE APPOINTMENT PRIOR TO NEXT REFILL   gabapentin (NEURONTIN) 100 MG capsule Take 1 capsule (100 mg total) by mouth at bedtime.   isosorbide mononitrate (IMDUR) 30 MG 24 hr tablet Take 2 tablets (60 mg total) by mouth every morning AND 1 tablet (30 mg total) at bedtime.   metoprolol succinate (TOPROL-XL) 50 MG 24 hr tablet Take 75 mg by mouth 2 (two) times daily.   omeprazole (PRILOSEC) 40 MG capsule Take 1 capsule (40 mg total) by mouth  daily.   traZODone (DESYREL) 50 MG tablet Take 0.5-1 tablets (25-50 mg total) by mouth at bedtime as needed for sleep.   Vitamin D, Ergocalciferol, (DRISDOL) 1.25 MG (50000 UNIT) CAPS capsule Take 50,000 Units by mouth once a week.   warfarin (COUMADIN) 5 MG tablet Take 2 tablets (10mg ) on 3/26 then 1.5 tablets (7.5mg ) on 3/27 and 3/28 then resume home regimen of 7.5mg  daily except 5mg  on Mondays and Fridays.   ZORYVE 0.3 % CREA        08/18/2023    1:43 PM 08/03/2023    2:58 PM 03/02/2023    3:48 PM 10/05/2022    8:07 AM  GAD 7 : Generalized Anxiety Score  Nervous, Anxious, on Edge 3 1 2  0  Control/stop worrying 2 1 1  0  Worry too much - different things 1 1 1  0  Trouble relaxing 2 1 1  0  Restless 2 1 0 0  Easily annoyed or irritable 2 1 0 0  Afraid - awful might happen 2 1 1  0  Total GAD 7 Score 14 7 6  0  Anxiety Difficulty  Somewhat difficult Somewhat difficult Not difficult at all       08/18/2023    1:42 PM 08/03/2023    2:56 PM 03/02/2023    3:48 PM  Depression screen PHQ 2/9  Decreased Interest 1 0 0  Down, Depressed, Hopeless 1 0 0  PHQ - 2 Score 2 0 0  Altered sleeping 0 1 1  Tired, decreased energy 1 1 1   Change in appetite 2 0 0  Feeling bad or failure about yourself  1 0 0  Trouble concentrating 1 0 0  Moving slowly or fidgety/restless 1 0 0  Suicidal thoughts 0 0 0  PHQ-9 Score 8 2 2   Difficult doing work/chores  Somewhat difficult Not difficult at all    BP Readings from Last 3 Encounters:  08/18/23 122/74  08/18/23 112/82  08/09/23 102/69    Physical Exam Constitutional:      Appearance: Normal appearance.  Cardiovascular:     Rate and Rhythm: Normal rate and regular rhythm.  Pulmonary:     Effort: Pulmonary effort is normal.     Breath sounds: No wheezing or rhonchi.  Chest:     Chest wall: No mass or deformity.  Musculoskeletal:     Cervical back: Normal range of motion.     Right lower leg: No edema.     Left lower leg: No edema.   Lymphadenopathy:     Cervical: No cervical adenopathy.  Neurological:     Mental Status: He is alert.     Wt Readings from Last 3 Encounters:  08/18/23 172 lb 8 oz (78.2 kg)  08/16/23 174 lb 8 oz (79.2 kg)  08/09/23 178 lb 9.6 oz (81 kg)    BP 122/74   Pulse (!) 126   Ht 5\' 7"  (1.702 m)   Wt 172 lb 8 oz (78.2 kg)   SpO2 95%   BMI 27.02 kg/m   Assessment and Plan:  Problem List Items Addressed This Visit       Unprioritized   Left-sided chest wall pain - Primary (Chronic)   This has been recurrent since his cardiac surgery.  It makes him anxious and he managed with prn Xanax. He recently has more severe pain and was admitted - cardiac cath was completely normal. It is now felt that his pain is neuropathic in nature. Will add gabapentin 100 mg at hs.      Relevant Medications   gabapentin (NEURONTIN) 100 MG capsule    No follow-ups on file.    Reubin Milan, MD [facility] [place]

## 2023-08-18 NOTE — Assessment & Plan Note (Signed)
 This has been recurrent since his cardiac surgery.  It makes him anxious and he managed with prn Xanax. He recently has more severe pain and was admitted - cardiac cath was completely normal. It is now felt that his pain is neuropathic in nature. Will add gabapentin 100 mg at hs.

## 2023-08-18 NOTE — Discharge Summary (Addendum)
 Discharge Summary    Patient ID: Henry Duncan MRN: [id number]; DOB: 02-16-1991  Admit date: 08/16/2023 Discharge date: 08/18/2023  PCP:  Reubin Milan, MD   [facility] Providers Cardiologist:  Debbe Odea, MD     Discharge Diagnoses    Principal Problem:   Chest pain Active Problems:   Aortic valve prosthesis present   Warfarin anticoagulation   Coronary artery disease involving native coronary artery of native heart   Elevated troponin  Diagnostic Studies/Procedures    Cath: 08/17/2023    Prox LAD lesion is 40% stenosed.   Mid LAD lesion is 50% stenosed.   Mild proximal LAD stenosis. Moderate proximal LAD stenosis, unchanged from last cath in 2023.  Minimal plaque in the circumflex artery Large dominant RCA with no obstructive disease.    Recommendations: Continue medical management of CAD.    Diagnostic Dominance: Right   Echo: 08/17/2023  IMPRESSIONS     1. Left ventricular ejection fraction, by estimation, is 65 to 70%. The  left ventricle has normal function. The left ventricle has no regional  wall motion abnormalities. Left ventricular diastolic parameters were  normal.   2. Right ventricular systolic function is normal. The right ventricular  size is normal.   3. The mitral valve is abnormal. Trivial mitral valve regurgitation. No  evidence of mitral stenosis.   4. Trivial, central aotric regurgitation. Gradients have increased from  2023 study (mean gradient 14 mm Hg). Acceleration time < 100 ms. DVI 0.41.  The aortic valve has been repaired/replaced. Aortic valve regurgitation is  trivial. There is a mechanical  valve present in the aortic position. Aortic valve area, by VTI measures  1.26 cm. Aortic valve mean gradient measures 22.0 mmHg. Aortic valve  acceleration time measures 95 msec.   5. Aortic root/ascending aorta has been repaired/replaced.   6. The inferior vena cava is normal in size with greater than 50%   respiratory variability, suggesting right atrial pressure of 3 mmHg.   Comparison(s): Prior images reviewed side by side. Aotric valve gradients  are higher than in 2023 study. Though a component of this is flow related  (LVOT VTI 21-> 26) DVI has decreased (0.46-> 0.4).   FINDINGS   Left Ventricle: Ventricular strain and 3D not performed. Left ventricular  ejection fraction, by estimation, is 65 to 70%. The left ventricle has  normal function. The left ventricle has no regional wall motion  abnormalities. Strain was performed and the  global longitudinal strain is indeterminate. The left ventricular internal  cavity size was normal in size. There is no left ventricular hypertrophy.  Left ventricular diastolic parameters were normal.   Right Ventricle: The right ventricular size is normal. No increase in  right ventricular wall thickness. Right ventricular systolic function is  normal.   Left Atrium: Left atrial size was normal in size.   Right Atrium: Right atrial size was normal in size.   Pericardium: There is no evidence of pericardial effusion.   Mitral Valve: The mitral valve is abnormal. Trivial mitral valve  regurgitation. There is a 23 mm St. Jude present in the mitral position.  No evidence of mitral valve stenosis. MV peak gradient, 3.0 mmHg. The mean  mitral valve gradient is 1.0 mmHg.   Tricuspid Valve: The tricuspid valve is normal in structure. Tricuspid  valve regurgitation is not demonstrated. No evidence of tricuspid  stenosis.   Aortic Valve: Trivial, central aotric regurgitation. Gradients have  increased from 2023 study (mean gradient 14  mm Hg). Acceleration time <  100 ms. DVI 0.41. The aortic valve has been repaired/replaced. Aortic  valve regurgitation is trivial. Aortic valve   mean gradient measures 22.0 mmHg. Aortic valve peak gradient measures  38.9 mmHg. Aortic valve area, by VTI measures 1.26 cm. There is a  mechanical valve present in the  aortic position.   Pulmonic Valve: The pulmonic valve was normal in structure. Pulmonic valve  regurgitation is trivial. No evidence of pulmonic stenosis.   Aorta: The aortic root/ascending aorta has been repaired/replaced.   Venous: The inferior vena cava is normal in size with greater than 50%  respiratory variability, suggesting right atrial pressure of 3 mmHg.   IAS/Shunts: The atrial septum is grossly normal.   Additional Comments: 3D was performed not requiring image post processing  on an independent workstation and was indeterminate.  _____________   History of Present Illness     Henry Duncan is a 33 y.o. 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 Jude's mechanical valve, developed pericardial effusion postoperatively and underwent pericardial window 08/2018), mixed hyperlipidemia  who was seen 08/16/2023 for the evaluation of Henry Kingsley,DO.   [name] follows with Dr. Debbe Odea and was last seen on 08/09/2023 for intermittent chest pain. At that visit his Imdur was increased to 90 mg daily.  He reported that despite the increase of the Imdur he continued to have chest discomfort.   He noted that this overall has been going on for 2 to 3 weeks.  He described this as  a sharp left-sided chest pain. He stated it was non-radiating. Reported some mild nausea, denied any vomiting, shortness of breath or diaphoresis. He denied any lower extremity swelling. He stated he has been compliant on his medication.  His mom who was in the room reports that the patient has had intermittent/chronic cough over 6 to 8 weeks now.    Hospital Course     Chest pain CAD Aortic aneurysm s/p Bentall w/ mechanical valve '20 Mildly elevated troponin -- presented with sharp left sided chest pain worse over the past couple of days. Went for a jog on hi  lunch break and developed worsening symptoms. hsTn 39>>52>>7, EKG showed sinus rhythm TWI in lead III, aVR. CRP, Sed rate WNL -- underwent cardiac cath noted above with 40% pLAD and 50% mLAD. Recommendations for medical therapy. Suspect non-cardiac etiology for symptoms. -- continue statin, Imdur -- resume home coumadin per pharmD recommendations. (Goal from prior notes of 2.5-3.0) -- has follow up in the clinic next week for INR check   HLD -- LDL 75, HDL 29 -- continue Zetia 10mg  daily, fenofibrate 160mg  daily, atorvastatin 80mg  daily, Nexlizet   General: Well developed, well nourished, male appearing in no acute distress. Head: Normocephalic, atraumatic.  Neck: Supple without bruits, JV. Lungs:  Resp regular and unlabored, CTA. Heart: RRR, S1, S2, no S3, S4, or murmur; no rub. Abdomen: Soft, non-tender, non-distended with normoactive bowel sounds. No hepatomegaly. No rebound/guarding. No obvious abdominal masses. Extremities: No clubbing, cyanosis, edema. Distal pedal pulses are 2+ bilaterally. Right femoral cath site stable without bruising or hematoma Neuro: Alert and oriented X 3. Moves all extremities spontaneously. Psych: Normal affect.  Patient was seen by myself and Dr. Excell Seltzer and deemed stable for discharge home. Follow up arranged in the office.  Did the patient have an acute coronary syndrome (MI, NSTEMI, STEMI, etc) this  admission?:  No                               Did the patient have a percutaneous coronary intervention (stent / angioplasty)?:  No.    _____________  Discharge Vitals Blood pressure 112/82, pulse 97, temperature 97.6 F (36.4 C), temperature source Oral, resp. rate 19, height 5\' 7"  (1.702 m), weight 79.2 kg, SpO2 98%.  Filed Weights   08/16/23 1446 08/16/23 2115  Weight: 81 kg 79.2 kg    Labs & Radiologic Studies    CBC Recent Labs    08/17/23 0752 08/18/23 0410  WBC 4.0 4.5  HGB 14.5 15.4  HCT 43.2 46.5  MCV 92.1 92.8  PLT 185 185    Basic Metabolic Panel Recent Labs    96/29/52 0752 08/18/23 0410  NA 137 138  K 3.9 3.9  CL 102 106  CO2 30 23  GLUCOSE 93 84  BUN 14 13  CREATININE 0.74 0.77  CALCIUM 9.0 8.9   Liver Function Tests Recent Labs    08/16/23 1450  AST 65*  ALT 66*  ALKPHOS 41  BILITOT 1.0  PROT 6.4*  ALBUMIN 4.2   No results for input(s): "LIPASE", "AMYLASE" in the last 72 hours. High Sensitivity Troponin:   Recent Labs  Lab 08/16/23 1448 08/16/23 1645 08/17/23 0752  TROPONINIHS 39* 52* 7    BNP Invalid input(s): "POCBNP" D-Dimer No results for input(s): "DDIMER" in the last 72 hours. Hemoglobin A1C Recent Labs    08/17/23 0752  HGBA1C 5.4   Fasting Lipid Panel Recent Labs    08/17/23 0752  CHOL 129  HDL 29*  LDLCALC 75  TRIG 841  CHOLHDL 4.4   Thyroid Function Tests Recent Labs    08/17/23 0752  TSH 1.517   _____________  CARDIAC CATHETERIZATION Result Date: 08/17/2023   Prox LAD lesion is 40% stenosed.   Mid LAD lesion is 50% stenosed. Mild proximal LAD stenosis. Moderate proximal LAD stenosis, unchanged from last cath in 2023. Minimal plaque in the circumflex artery Large dominant RCA with no obstructive disease. Recommendations: Continue medical management of CAD.   ECHOCARDIOGRAM COMPLETE Result Date: 08/17/2023    ECHOCARDIOGRAM REPORT   Patient Name:   Henry Duncan Date of Exam: 08/17/2023 Medical Rec #:  [id number]             Height:       67.0 in Accession #:    [id number]            Weight:       174.5 lb Date of Birth:  [birth year]             BSA:          1.908 m Patient Age:    33 years              BP:           107/75 mmHg Patient Gender: M                     HR:           59 bpm. Exam Location:  Inpatient Procedure: 2D Echo, Color Doppler and Cardiac Doppler (Both Spectral and Color            Flow Doppler were utilized during procedure). Indications:    Chest Pain  History:        Patient has prior  history of Echocardiogram examinations.  CAD.                 Ascending aorta aneurysm repair.                 Aortic Valve: mechanical valve is present in the aortic                 position.  Sonographer:    Lamont Snowball Referring Phys: [id number] SHENG L HALEY IMPRESSIONS  1. Left ventricular ejection fraction, by estimation, is 65 to 70%. The left ventricle has normal function. The left ventricle has no regional wall motion abnormalities. Left ventricular diastolic parameters were normal.  2. Right ventricular systolic function is normal. The right ventricular size is normal.  3. The mitral valve is abnormal. Trivial mitral valve regurgitation. No evidence of mitral stenosis.  4. Trivial, central aotric regurgitation. Gradients have increased from 2023 study (mean gradient 14 mm Hg). Acceleration time < 100 ms. DVI 0.41. The aortic valve has been repaired/replaced. Aortic valve regurgitation is trivial. There is a mechanical valve present in the aortic position. Aortic valve area, by VTI measures 1.26 cm. Aortic valve mean gradient measures 22.0 mmHg. Aortic valve acceleration time measures 95 msec.  5. Aortic root/ascending aorta has been repaired/replaced.  6. The inferior vena cava is normal in size with greater than 50% respiratory variability, suggesting right atrial pressure of 3 mmHg. Comparison(s): Prior images reviewed side by side. Aotric valve gradients are higher than in 2023 study. Though a component of this is flow related (LVOT VTI 21-> 26) DVI has decreased (0.46-> 0.4). FINDINGS  Left Ventricle: Ventricular strain and 3D not performed. Left ventricular ejection fraction, by estimation, is 65 to 70%. The left ventricle has normal function. The left ventricle has no regional wall motion abnormalities. Strain was performed and the global longitudinal strain is indeterminate. The left ventricular internal cavity size was normal in size. There is no left ventricular hypertrophy. Left ventricular diastolic parameters were normal. Right  Ventricle: The right ventricular size is normal. No increase in right ventricular wall thickness. Right ventricular systolic function is normal. Left Atrium: Left atrial size was normal in size. Right Atrium: Right atrial size was normal in size. Pericardium: There is no evidence of pericardial effusion. Mitral Valve: The mitral valve is abnormal. Trivial mitral valve regurgitation. There is a 23 mm St. Jude present in the mitral position. No evidence of mitral valve stenosis. MV peak gradient, 3.0 mmHg. The mean mitral valve gradient is 1.0 mmHg. Tricuspid Valve: The tricuspid valve is normal in structure. Tricuspid valve regurgitation is not demonstrated. No evidence of tricuspid stenosis. Aortic Valve: Trivial, central aotric regurgitation. Gradients have increased from 2023 study (mean gradient 14 mm Hg). Acceleration time < 100 ms. DVI 0.41. The aortic valve has been repaired/replaced. Aortic valve regurgitation is trivial. Aortic valve  mean gradient measures 22.0 mmHg. Aortic valve peak gradient measures 38.9 mmHg. Aortic valve area, by VTI measures 1.26 cm. There is a mechanical valve present in the aortic position. Pulmonic Valve: The pulmonic valve was normal in structure. Pulmonic valve regurgitation is trivial. No evidence of pulmonic stenosis. Aorta: The aortic root/ascending aorta has been repaired/replaced. Venous: The inferior vena cava is normal in size with greater than 50% respiratory variability, suggesting right atrial pressure of 3 mmHg. IAS/Shunts: The atrial septum is grossly normal. Additional Comments: 3D was performed not requiring image post processing on an independent workstation and was indeterminate.  LEFT VENTRICLE PLAX 2D LVIDd:  5.20 cm   Diastology LVIDs:         3.20 cm   LV e' medial:    9.46 cm/s LV PW:         0.90 cm   LV E/e' medial:  7.9 LV IVS:        0.90 cm   LV e' lateral:   11.00 cm/s LVOT diam:     2.00 cm   LV E/e' lateral: 6.8 LV SV:         83 LV SV Index:    43 LVOT Area:     3.14 cm  RIGHT VENTRICLE             IVC RV Basal diam:  4.00 cm     IVC diam: 1.40 cm RV S prime:     14.10 cm/s TAPSE (M-mode): 2.6 cm LEFT ATRIUM             Index        RIGHT ATRIUM           Index LA Vol (A2C):   59.9 ml 31.39 ml/m  RA Area:     17.50 cm LA Vol (A4C):   42.3 ml 22.17 ml/m  RA Volume:   46.80 ml  24.52 ml/m LA Biplane Vol: 51.7 ml 27.09 ml/m  AORTIC VALVE AV Area (Vmax):    1.12 cm AV Area (Vmean):   1.26 cm AV Area (VTI):     1.26 cm AV Vmax:           312.00 cm/s AV Vmean:          220.000 cm/s AV VTI:            0.656 m AV Peak Grad:      38.9 mmHg AV Mean Grad:      22.0 mmHg LVOT Vmax:         111.00 cm/s LVOT Vmean:        88.200 cm/s LVOT VTI:          0.264 m LVOT/AV VTI ratio: 0.40  AORTA Ao Root diam: 2.70 cm Ao Asc diam:  2.70 cm MITRAL VALVE MV Area (PHT): 2.79 cm    SHUNTS MV Area VTI:   2.69 cm    Systemic VTI:  0.26 m MV Peak grad:  3.0 mmHg    Systemic Diam: 2.00 cm MV Mean grad:  1.0 mmHg MV Vmax:       0.86 m/s MV Vmean:      56.6 cm/s MV Decel Time: 272 msec MV E velocity: 74.60 cm/s MV A velocity: 61.20 cm/s MV E/A ratio:  1.22 Riley Lam MD Electronically signed by Riley Lam MD Signature Date/Time: 08/17/2023/12:07:42 PM    Final    DG Chest 2 View Result Date: 08/16/2023 CLINICAL DATA:  Chest pain EXAM: CHEST - 2 VIEW COMPARISON:  06/13/2023 FINDINGS: Previous median sternotomy and aortic valve replacement. Stable cardiac silhouette and vascularity. Negative for edema, acute CHF, pneumonia, collapse or consolidation. No large effusion or pneumothorax. Trachea midline. No osseous abnormality. IMPRESSION: 1. Previous median sternotomy and aortic valve replacement. 2. No acute chest process. Electronically Signed   By: Judie Petit.  Shick M.D.   On: 08/16/2023 15:43   Disposition   Pt is being discharged home today in good condition.  Follow-up Plans & Appointments     Discharge Instructions     Diet - low sodium heart  healthy   Complete by: As directed    Discharge instructions  Complete by: As directed    Radial Site Care Refer to this sheet in the next few weeks. These instructions provide you with information on caring for yourself after your procedure. Your caregiver may also give you more specific instructions. Your treatment has been planned according to current medical practices, but problems sometimes occur. Call your caregiver if you have any problems or questions after your procedure. HOME CARE INSTRUCTIONS You may shower the day after the procedure. Remove the bandage (dressing) and gently wash the site with plain soap and water. Gently pat the site dry.  Do not apply powder or lotion to the site.  Do not submerge the affected site in water for 3 to 5 days.  Inspect the site at least twice daily.  Do not flex or bend the affected arm for 24 hours.  No lifting over 5 pounds (2.3 kg) for 5 days after your procedure.  Do not drive home if you are discharged the same day of the procedure. Have someone else drive you.  You may drive 24 hours after the procedure unless otherwise instructed by your caregiver.  What to expect: Any bruising will usually fade within 1 to 2 weeks.  Blood that collects in the tissue (hematoma) may be painful to the touch. It should usually decrease in size and tenderness within 1 to 2 weeks.  SEEK IMMEDIATE MEDICAL CARE IF: You have unusual pain at the radial site.  You have redness, warmth, swelling, or pain at the radial site.  You have drainage (other than a small amount of blood on the dressing).  You have chills.  You have a fever or persistent symptoms for more than 72 hours.  You have a fever and your symptoms suddenly get worse.  Your arm becomes pale, cool, tingly, or numb.  You have heavy bleeding from the site. Hold pressure on the site.   Increase activity slowly   Complete by: As directed         Discharge Medications   Allergies as of 08/18/2023    No Known Allergies      Medication List     STOP taking these medications    methocarbamol 500 MG tablet Commonly known as: ROBAXIN   oxyCODONE 5 MG immediate release tablet Commonly known as: Oxy IR/ROXICODONE       TAKE these medications    ALPRAZolam 0.5 MG tablet Commonly known as: XANAX TAKE ONE TABLET BY MOUTH TWICE A DAY AS NEEDED FOR ANXIETY   atorvastatin 80 MG tablet Commonly known as: LIPITOR Take 1 tablet (80 mg total) by mouth daily.   Bimzelx 160 MG/ML pen Generic drug: bimekizumab-bkzx Inject into the skin every 30 (thirty) days.   busPIRone 5 MG tablet Commonly known as: BUSPAR TAKE ONE TABLET BY MOUTH TWICE A DAY   cetirizine 10 MG tablet Commonly known as: ZYRTEC Take 10 mg by mouth daily as needed for allergies.   fenofibrate 145 MG tablet Commonly known as: Tricor Take 1 tablet (145 mg total) by mouth daily. PLEASE CALL OFFICE TO SCHEDULE APPOINTMENT PRIOR TO NEXT REFILL   isosorbide mononitrate 30 MG 24 hr tablet Commonly known as: IMDUR Take 2 tablets (60 mg total) by mouth every morning AND 1 tablet (30 mg total) at bedtime.   metoprolol succinate 50 MG 24 hr tablet Commonly known as: TOPROL-XL Take 75 mg by mouth 2 (two) times daily.   Nexlizet 180-10 MG Tabs Generic drug: Bempedoic Acid-Ezetimibe Take 1 tablet by mouth daily.  omeprazole 40 MG capsule Commonly known as: PRILOSEC Take 1 capsule (40 mg total) by mouth daily.   traZODone 50 MG tablet Commonly known as: DESYREL Take 0.5-1 tablets (25-50 mg total) by mouth at bedtime as needed for sleep.   Vitamin D (Ergocalciferol) 1.25 MG (50000 UNIT) Caps capsule Commonly known as: DRISDOL Take 50,000 Units by mouth once a week.   warfarin 5 MG tablet Commonly known as: COUMADIN Take as directed. If you are unsure how to take this medication, talk to your nurse or doctor. Original instructions: Take 2 tablets (10mg ) on 3/26 then 1.5 tablets (7.5mg ) on 3/27 and 3/28 then  resume home regimen of 7.5mg  daily except 5mg  on Mondays and Fridays. What changed: additional instructions   Zoryve 0.3 % Crea Generic drug: Roflumilast           Outstanding Labs/Studies   INR check 4/3  Duration of Discharge Encounter: APP Time: 20 minutes   Signed, Laverda Page, NP 08/18/2023, 9:21 AM  Patient seen, examined. Available data reviewed. Agree with findings, assessment, and plan as outlined by Laverda Page, NP.  The patient continues to have mild nagging chest discomfort.  He otherwise is doing fine.  On my exam he is alert, oriented, in no distress.  JVP is normal, lungs are clear bilaterally, heart is regular rate and rhythm with normal mechanical A2 and a soft ejection murmur at the right upper sternal border, abdomen is soft and nontender, right groin site is clear with no hematoma or ecchymosis, no lower extremity edema.  I reviewed his cardiac catheterization study which demonstrated no significant changes from his previous cath and there is no high-grade coronary stenosis.  We discussed management of his chronic pain that he is experienced ever since his sternotomy.  He had a CT scan last year that demonstrated no sternal abnormality.  He is not tender to palpation over the sternum.  Advised that he could consider evaluation by pain management specialist to see if there is anything else that could be offered.  Asked him to consider some holistic therapies as well.  I cautioned him about the use of narcotic analgesics especially at his young age.  From a cardiac standpoint, he is clinically stable and medically ready for discharge.  He has been given instructions on warfarin dosing and he has close follow-up scheduled at the anticoagulation clinic next week.  He does not require anticoagulation bridging with a mechanical aortic valve as there is no history of atrial fibrillation, stroke or TIA, or rheumatic heart disease.  MD time equal to 30 minutes including data  review of labs, echo images, and cardiac catheterization data.  Also includes physical exam and counseling of the patient, his wife, and his mother who are present at the bedside with specific detailed discussion regarding pain management.  Tonny Bollman, M.D. 08/18/2023 10:41 AM

## 2023-08-18 NOTE — Progress Notes (Addendum)
 PHARMACY - ANTICOAGULATION CONSULT NOTE  Pharmacy Consult for heparin  Indication: chest pain/ACS and warfarin PTA for  aortic mechanical valve   No Known Allergies  Patient Measurements: Height: 5\' 7"  (170.2 cm) Weight: 79.2 kg (174 lb 8 oz) IBW/kg (Calculated) : 66.1 Heparin Dosing Weight: 81kg   Vital Signs: Temp: 97.6 F (36.4 C) (03/26 0736) Temp Source: Oral (03/26 0736) BP: 98/75 (03/26 0736) Pulse Rate: 97 (03/26 0736)  Labs: Recent Labs    08/16/23 1448 08/16/23 1450 08/16/23 1645 08/16/23 1732 08/17/23 0752 08/18/23 0410  HGB 14.2  --   --   --  14.5 15.4  HCT 42.2  --   --   --  43.2 46.5  PLT 212  --   --   --  185 185  LABPROT  --   --   --  26.2* 19.6* 19.0*  INR  --   --   --  2.4* 1.6* 1.6*  HEPARINUNFRC  --   --   --   --  0.17* 0.32  CREATININE  --  0.85  --   --  0.74 0.77  TROPONINIHS 39*  --  52*  --  7  --     Estimated Creatinine Clearance: 123.9 mL/min (by C-G formula based on SCr of 0.77 mg/dL).   Medical History: Past Medical History:  Diagnosis Date   Allergy    Anxiety    Aortic aneurysm (HCC) 09/16/2018   a. 08/2018 Asc Ao/root aneuryms w/ dissection-->s/p Bentall w/ 26mm Hemashield graft, 23mm SJM AVR, and reimplantation of cor arteries; b. 04/2021 CTA Chest: stable post-op changes.   CAD (coronary artery disease)    a. 08/2018 Cor CTA: Ca2+ 99th%'ile (476). Calcified plaque in mLAD. Nl LM, LCX, RCA. S/p reimplantation of coronary arteries in setting of Bentall; b. 05/2021 Cor CTA: LAD >40m (nl FFRct); c. 05/2021 Cath: LM nl, LAD 40p, 67m, LCX/OM1 min irregs, RCA nl->Med rx.   H/O mechanical aortic valve replacement    a. 08/2018 in setting of bicuspid AoV and Asc Ao dissection-->49mm SJM mech AVR; b. 03/2020 Echo: EF 55-60%. No rwma. Mild LVH. Nl RV fxn. Nl functioning AVR.   Hyperlipidemia    Hypertension    Pericardial effusion    a. 08/2018 post-op -->s/p window.    Assessment: Patient presenting with CC of chest pain x 2 weeks,  trop elevated 39 > 52. Patient on warfarin PTA for hx mAVR, taking 7.5mg  all days except Mon/Fri taking 5mg . INR today subtherapeutic at 2.4 (goal 2.5-3.5). Last dose of warfarin 3/23 @ 18:00. Pharmacy consulted to dose heparin.   INR 1.6 s/p cath, planning to discharge today without bridge given AoV and no AFib.  Goal of Therapy:  Heparin level 0.3-0.7 units/ml Monitor platelets by anticoagulation protocol: Yes   Plan:  Stop heparin Warfarin 10mg  x1 tonight then resume home regimen except 7.5mg  Friday  Fredonia Highland, PharmD, [place], [facility] Clinical Pharmacist [phone] Please check AMION for all [facility] Pharmacy numbers 08/18/2023

## 2023-08-18 NOTE — Progress Notes (Signed)
 PHARMACY - ANTICOAGULATION CONSULT NOTE  Pharmacy Consult for heparin and warfarin Indication: chest pain/ACS and warfarin PTA for  aortic mechanical valve   No Known Allergies  Patient Measurements: Height: 5\' 7"  (170.2 cm) Weight: 79.2 kg (174 lb 8 oz) IBW/kg (Calculated) : 66.1 Heparin Dosing Weight: 81kg   Vital Signs: Temp: 98 F (36.7 C) (03/26 0450) Temp Source: Oral (03/26 0450) BP: 109/73 (03/26 0450) Pulse Rate: 72 (03/26 0450)  Labs: Recent Labs    08/16/23 1448 08/16/23 1450 08/16/23 1645 08/16/23 1732 08/17/23 0752 08/18/23 0410  HGB 14.2  --   --   --  14.5 15.4  HCT 42.2  --   --   --  43.2 46.5  PLT 212  --   --   --  185 185  LABPROT  --   --   --  26.2* 19.6* 19.0*  INR  --   --   --  2.4* 1.6* 1.6*  HEPARINUNFRC  --   --   --   --  0.17* 0.32  CREATININE  --  0.85  --   --  0.74 0.77  TROPONINIHS 39*  --  52*  --  7  --     Estimated Creatinine Clearance: 123.9 mL/min (by C-G formula based on SCr of 0.77 mg/dL).  Assessment: Patient presenting with CC of chest pain x 2 weeks, trop elevated 39 > 52. Patient on warfarin PTA for hx mAVR, taking 7.5mg  all days except Mon/Fri taking 5mg . INR today subtherapeutic at 2.4 (goal 2.5-3.5). Last dose of warfarin 3/23 @ 18:00. Pharmacy consulted to dose heparin pre cath.   Pt now s/p cath - plan for medical management. Consult to restart heparin 4 hours post sheath removal (start at 7pm) and okay to restart coumadin today too. INR down to 1.6 today.  3/26 AM update:  Heparin level therapeutic   Goal of Therapy:  Heparin level 0.3-0.7 units/ml Monitor platelets by anticoagulation protocol: Yes INR 2.5-3.5   Plan:  Cont heparin 1200 units/hr Heparin level in 8 hours  Abran Duke, PharmD, BCPS Clinical Pharmacist Phone: [phone]

## 2023-08-18 NOTE — TOC Benefit Eligibility Note (Signed)
 Pharmacy Patient Advocate Encounter  Insurance verification completed.   The patient is insured through [employer] test claim for Enoxaparin. Currently a quantity of 10 is a 5 day supply and the co-pay is $0.00 .  This test claim was processed through [facility]- copay amounts may vary at other pharmacies due to pharmacy/plan contracts, or as the patient moves through the different stages of their insurance plan.

## 2023-08-18 NOTE — Progress Notes (Signed)
   08/18/23 1010  TOC Brief Assessment  Insurance and Status Reviewed  Patient has primary care physician Yes  Home environment has been reviewed home w/ spouse  Prior level of function: self/independent  Prior/Current Home Services No current home services  Social Drivers of Health Review SDOH reviewed no interventions necessary  Readmission risk has been reviewed Yes  Transition of care needs no transition of care needs at this time    Pt stable for transition home today, no HH or DME needs noted.

## 2023-08-23 ENCOUNTER — Ambulatory Visit: Payer: PRIVATE HEALTH INSURANCE | Admitting: Cardiology

## 2023-08-23 ENCOUNTER — Other Ambulatory Visit: Payer: Self-pay | Admitting: Internal Medicine

## 2023-08-23 DIAGNOSIS — F411 Generalized anxiety disorder: Secondary | ICD-10-CM

## 2023-08-25 ENCOUNTER — Ambulatory Visit: Payer: PRIVATE HEALTH INSURANCE | Attending: Cardiology

## 2023-08-25 DIAGNOSIS — Z952 Presence of prosthetic heart valve: Secondary | ICD-10-CM | POA: Diagnosis not present

## 2023-08-25 DIAGNOSIS — Z5181 Encounter for therapeutic drug level monitoring: Secondary | ICD-10-CM

## 2023-08-25 LAB — POCT INR: INR: 3 (ref 2.0–3.0)

## 2023-08-25 NOTE — Telephone Encounter (Signed)
 Requested medication (s) are due for refill today: Yes  Requested medication (s) are on the active medication list: Yes  Last refill:    Future visit scheduled:   Notes to clinic:  Not delegated.    Requested Prescriptions  Pending Prescriptions Disp Refills   ALPRAZolam (XANAX) 0.5 MG tablet [Pharmacy Med Name: ALPRAZOLAM 0.5 MG TAB] 20 tablet 1    Sig: TAKE ONE TABLET BY MOUTH TWICE A DAY AS NEEDED FOR ANXIETY     Not Delegated - Psychiatry: Anxiolytics/Hypnotics 2 Failed - 08/25/2023 12:24 PM      Failed - This refill cannot be delegated      Failed - Urine Drug Screen completed in last 360 days      Failed - Valid encounter within last 6 months    Recent Outpatient Visits           1 week ago Left-sided chest wall pain   [place] [facility] at [facility], Nyoka Cowden, MD   3 weeks ago Primary insomnia   [facility] at [facility], Nyoka Cowden, MD       Future Appointments             In 1 month Judithann Graves, Nyoka Cowden, MD [facility] at [facility], [facility]   In 1 month Debbe Odea, MD [facility] at [facility] - Patient is not pregnant

## 2023-08-25 NOTE — Patient Instructions (Signed)
Continue warfarin 1.5 tablets daily except 1 tablet Mondays and Fridays. Recheck INR in 4 weeks  [phone]

## 2023-08-27 ENCOUNTER — Other Ambulatory Visit: Payer: Self-pay

## 2023-08-27 MED ORDER — FENOFIBRATE 145 MG PO TABS: 145.0000 mg | ORAL_TABLET | Freq: Every day | ORAL | 3 refills | Status: AC

## 2023-09-06 ENCOUNTER — Encounter: Payer: PRIVATE HEALTH INSURANCE | Admitting: Sleep Medicine

## 2023-09-15 ENCOUNTER — Other Ambulatory Visit: Payer: Self-pay | Admitting: Internal Medicine

## 2023-09-15 DIAGNOSIS — R0789 Other chest pain: Secondary | ICD-10-CM

## 2023-09-16 NOTE — Telephone Encounter (Signed)
 Requested medications are due for refill today.  yes  Requested medications are on the active medications list.  yes  Last refill. 08/18/2023 #30 0 rf  Future visit scheduled.   yes  Notes to clinic.  New medication to this pt.    Requested Prescriptions  Pending Prescriptions Disp Refills   gabapentin  (NEURONTIN ) 100 MG capsule [Pharmacy Med Name: GABAPENTIN  100 MG CAP[*]] 30 capsule 0    Sig: TAKE ONE CAPSULE BY MOUTH AT BEDTIME     Neurology: Anticonvulsants - gabapentin  Failed - 09/16/2023  4:25 PM      Failed - Valid encounter within last 12 months    Recent Outpatient Visits           4 weeks ago Left-sided chest wall pain   [place] [facility] at [facility], Chales [place], MD   1 month ago Primary insomnia   [place] [facility] at [facility], Chales [place], MD       Future Appointments             In 3 weeks Gala Jubilee Chales [place], MD [facility] at [facility], [facility]   In 3 weeks Constancia Delton, MD [facility] at [facility] in normal range and within 360 days    Creatinine  Date Value Ref Range Status  06/30/2013 0.96 0.60 - 1.30 mg/dL Final   Creatinine, Ser  Date Value Ref Range Status  08/18/2023 0.77 0.61 - 1.24 mg/dL Final         Passed - Completed PHQ-2 or PHQ-9 in the last 360 days

## 2023-09-16 NOTE — Telephone Encounter (Signed)
 Please review.  KP

## 2023-09-22 ENCOUNTER — Ambulatory Visit: Payer: PRIVATE HEALTH INSURANCE | Attending: Cardiology

## 2023-09-22 DIAGNOSIS — Z5181 Encounter for therapeutic drug level monitoring: Secondary | ICD-10-CM | POA: Diagnosis not present

## 2023-09-22 DIAGNOSIS — Z952 Presence of prosthetic heart valve: Secondary | ICD-10-CM | POA: Diagnosis not present

## 2023-09-22 LAB — POCT INR: INR: 3.1 — AB (ref 2.0–3.0)

## 2023-09-22 NOTE — Patient Instructions (Signed)
Continue warfarin 1.5 tablets daily except 1 tablet Mondays and Fridays. Recheck INR in 6 weeks  [phone]

## 2023-09-29 ENCOUNTER — Other Ambulatory Visit: Payer: Self-pay

## 2023-09-29 DIAGNOSIS — Z7901 Long term (current) use of anticoagulants: Secondary | ICD-10-CM

## 2023-09-29 DIAGNOSIS — Z5181 Encounter for therapeutic drug level monitoring: Secondary | ICD-10-CM

## 2023-09-29 MED ORDER — WARFARIN SODIUM 5 MG PO TABS: ORAL_TABLET | ORAL | 1 refills | Status: DC

## 2023-10-07 ENCOUNTER — Ambulatory Visit (INDEPENDENT_AMBULATORY_CARE_PROVIDER_SITE_OTHER): Payer: PRIVATE HEALTH INSURANCE | Admitting: Internal Medicine

## 2023-10-07 ENCOUNTER — Encounter: Payer: Self-pay | Admitting: Internal Medicine

## 2023-10-07 VITALS — BP 118/78 | HR 66 | Ht 67.0 in | Wt 160.0 lb

## 2023-10-07 DIAGNOSIS — F411 Generalized anxiety disorder: Secondary | ICD-10-CM

## 2023-10-07 DIAGNOSIS — Z0000 Encounter for general adult medical examination without abnormal findings: Secondary | ICD-10-CM | POA: Diagnosis not present

## 2023-10-07 DIAGNOSIS — Z23 Encounter for immunization: Secondary | ICD-10-CM | POA: Diagnosis not present

## 2023-10-07 DIAGNOSIS — K219 Gastro-esophageal reflux disease without esophagitis: Secondary | ICD-10-CM

## 2023-10-07 DIAGNOSIS — E782 Mixed hyperlipidemia: Secondary | ICD-10-CM

## 2023-10-07 DIAGNOSIS — I2510 Atherosclerotic heart disease of native coronary artery without angina pectoris: Secondary | ICD-10-CM

## 2023-10-07 DIAGNOSIS — R0789 Other chest pain: Secondary | ICD-10-CM

## 2023-10-07 MED ORDER — ALPRAZOLAM 0.5 MG PO TABS: 0.5000 mg | ORAL_TABLET | Freq: Every evening | ORAL | 3 refills | Status: DC | PRN

## 2023-10-07 MED ORDER — GABAPENTIN 100 MG PO CAPS: 100.0000 mg | ORAL_CAPSULE | Freq: Every day | ORAL | 3 refills | Status: DC

## 2023-10-07 NOTE — Assessment & Plan Note (Signed)
 LDL is  Lab Results  Component Value Date   LDLCALC 75 08/17/2023   Current regimen is Nexlizet , Fenofibrate  and atorvastatin .  No medication side effects noted. Goal LDL is <55.

## 2023-10-07 NOTE — Assessment & Plan Note (Signed)
 Followed by Cardiology closely On beta blockers and statins

## 2023-10-07 NOTE — Progress Notes (Signed)
 Date:  10/07/2023   Name:  Henry Duncan   DOB:  26-Apr-1991   MRN:  [id number]   Chief Complaint: Annual Exam and Shoulder Pain (X 2 weeks, patient said he thinks he pulled a muscle ) Henry Duncan is a 33 y.o. male who presents today for his Complete Annual Exam. He feels fairly well. He reports exercising none. He reports he is sleeping fairly well.   Health Maintenance  Topic Date Due   COVID-19 Vaccine (1) 10/23/2023*   Flu Shot  12/24/2023   DTaP/Tdap/Td vaccine (3 - Td or Tdap) 10/01/2031   Pneumococcal Vaccination  Completed   Hepatitis C Screening  Completed   HIV Screening  Completed   HPV Vaccine  Aged Out   Meningitis B Vaccine  Aged Out  *Topic was postponed. The date shown is not the original due date.    No results found for: "PSA1", "PSA"   Hyperlipidemia This is a chronic problem. The problem is controlled. Associated symptoms include chest pain. Pertinent negatives include no shortness of breath. Current antihyperlipidemic treatment includes ezetimibe , fibric acid derivatives and statins (and Nexlitol). The current treatment provides significant improvement of lipids.  Anxiety Presents for follow-up visit. Symptoms include chest pain and insomnia. Patient reports no dizziness, palpitations or shortness of breath.    Insomnia The problem occurs nightly. Treatments tried: trazodone .  Left sided chest wall pain - chronic, felt to be neuropathic in origin after multiple ER visits.  Started on gabapentin  with some benefit.  He denies sedation or confusion.  Would be willing to try higher dose.  Review of Systems  Constitutional:  Negative for fatigue and unexpected weight change.  HENT:  Negative for nosebleeds.   Eyes:  Negative for visual disturbance.  Respiratory:  Negative for cough, chest tightness, shortness of breath and wheezing.   Cardiovascular:  Positive for chest pain. Negative for palpitations and leg swelling.  Gastrointestinal:   Negative for abdominal pain, constipation and diarrhea.  Neurological:  Negative for dizziness, weakness, light-headedness and headaches.  Psychiatric/Behavioral:  The patient has insomnia.      Lab Results  Component Value Date   NA 138 08/18/2023   K 3.9 08/18/2023   CO2 23 08/18/2023   GLUCOSE 84 08/18/2023   BUN 13 08/18/2023   CREATININE 0.77 08/18/2023   CALCIUM  8.9 08/18/2023   EGFR 124 10/05/2022   GFRNONAA >60 08/18/2023   Lab Results  Component Value Date   CHOL 129 08/17/2023   HDL 29 (L) 08/17/2023   LDLCALC 75 08/17/2023   TRIG 127 08/17/2023   CHOLHDL 4.4 08/17/2023   Lab Results  Component Value Date   TSH 1.517 08/17/2023   Lab Results  Component Value Date   HGBA1C 5.4 08/17/2023   Lab Results  Component Value Date   WBC 4.5 08/18/2023   HGB 15.4 08/18/2023   HCT 46.5 08/18/2023   MCV 92.8 08/18/2023   PLT 185 08/18/2023   Lab Results  Component Value Date   ALT 66 (H) 08/16/2023   AST 65 (H) 08/16/2023   ALKPHOS 41 08/16/2023   BILITOT 1.0 08/16/2023   No results found for: "25OHVITD2", "25OHVITD3", "VD25OH"   Patient Active Problem List   Diagnosis Date Noted   Left-sided chest wall pain 08/16/2023   Chronic GERD 05/26/2022   OSA (obstructive sleep apnea) 04/15/2022   Plaque psoriasis 04/14/2022   Mixed hyperlipidemia 09/09/2021   Onychodystrophy 06/25/2021   Neoplasm of uncertain behavior of skin 06/25/2021  Generalized anxiety disorder 06/25/2021   Abnormal cardiac CT angiography    Coronary artery disease involving native coronary artery of native heart    Warfarin anticoagulation 03/15/2019   Encounter for therapeutic drug monitoring 09/29/2018   Aortic valve prosthesis present 09/29/2018   S/P ascending aortic aneurysm repair 09/19/2018   Allergic rhinitis 09/06/2018   Hepatic steatosis 10/30/2016   Insomnia 11/29/2015   Migraine 11/29/2015    No Known Allergies  Past Surgical History:  Procedure Laterality Date    BENTALL PROCEDURE N/A 09/19/2018   Procedure: BENTALL PROCEDURE WITH 23 ST. JUDE GRAFT CONDUIT.;  Surgeon: Bartley Lightning, MD;  Location: [facility] OR;  Service: Open Heart Surgery;  Laterality: N/A;   CARDIAC SURGERY     CLEFT LIP REPAIR     CORONARY ANGIOGRAPHY Left 06/13/2021   Procedure: CORONARY ANGIOGRAPHY (CATH LAB);  Surgeon: Wenona Hamilton, MD;  Location: [facility] INVASIVE CV LAB;  Service: Cardiovascular;  Laterality: Left;   CORONARY ANGIOGRAPHY N/A 08/17/2023   Procedure: CORONARY ANGIOGRAPHY;  Surgeon: Odie Benne, MD;  Location: [facility] INVASIVE CV LAB;  Service: Cardiovascular;  Laterality: N/A;   CORONARY PRESSURE/FFR WITH 3D MAPPING N/A 06/13/2021   Procedure: Coronary Pressure Wire, IFR;  Surgeon: Wenona Hamilton, MD;  Location: [facility] INVASIVE CV LAB;  Service: Cardiovascular;  Laterality: N/A;   ESOPHAGOGASTRODUODENOSCOPY (EGD) WITH PROPOFOL  N/A 05/26/2022   Procedure: ESOPHAGOGASTRODUODENOSCOPY (EGD) WITH PROPOFOL ;  Surgeon: Selena Daily, MD;  Location: [facility] ENDOSCOPY;  Service: Gastroenterology;  Laterality: N/A;   SUBXYPHOID PERICARDIAL WINDOW N/A 09/26/2018   Procedure: SUBXYPHOID PERICARDIAL WINDOW;  Surgeon: Bartley Lightning, MD;  Location: [facility] OR;  Service: Thoracic;  Laterality: N/A;   TEE WITHOUT CARDIOVERSION N/A 09/19/2018   Procedure: TRANSESOPHAGEAL ECHOCARDIOGRAM (TEE);  Surgeon: Bartley Lightning, MD;  Location: [facility] OR;  Service: Open Heart Surgery;  Laterality: N/A;   WISDOM TOOTH EXTRACTION      Social History   Tobacco Use   Smoking status: Never   Smokeless tobacco: Never  Vaping Use   Vaping status: Former  Substance Use Topics   Alcohol use: Not Currently   Drug use: Never     Medication list has been reviewed and updated.  Current Meds  Medication Sig   atorvastatin  (LIPITOR) 80 MG tablet Take 1 tablet (80 mg total) by mouth daily.   Bempedoic Acid -Ezetimibe  (NEXLIZET ) 180-10 MG TABS Take 1 tablet by mouth daily.   bimekizumab-bkzx  (BIMZELX) 160 MG/ML pen Inject into the skin every 30 (thirty) days.   busPIRone  (BUSPAR ) 5 MG tablet TAKE ONE TABLET BY MOUTH TWICE A DAY   cetirizine (ZYRTEC) 10 MG tablet Take 10 mg by mouth daily as needed for allergies.   fenofibrate  (TRICOR ) 145 MG tablet Take 1 tablet (145 mg total) by mouth daily. PLEASE CALL OFFICE TO SCHEDULE APPOINTMENT PRIOR TO NEXT REFILL   isosorbide  mononitrate (IMDUR ) 30 MG 24 hr tablet Take 2 tablets (60 mg total) by mouth every morning AND 1 tablet (30 mg total) at bedtime.   metoprolol  succinate (TOPROL -XL) 50 MG 24 hr tablet Take 75 mg by mouth 2 (two) times daily.   omeprazole  (PRILOSEC) 40 MG capsule Take 1 capsule (40 mg total) by mouth daily.   traZODone  (DESYREL ) 50 MG tablet Take 0.5-1 tablets (25-50 mg total) by mouth at bedtime as needed for sleep.   Vitamin D, Ergocalciferol, (DRISDOL) 1.25 MG (50000 UNIT) CAPS capsule Take 50,000 Units by mouth once a week.   warfarin (COUMADIN ) 5 MG tablet Take  1-2 tablets daily or as prescribed by coumadin  clinic   ZORYVE 0.3 % CREA as needed.   [DISCONTINUED] ALPRAZolam  (XANAX ) 0.5 MG tablet TAKE ONE TABLET BY MOUTH TWICE A DAY AS NEEDED FOR ANXIETY   [DISCONTINUED] gabapentin  (NEURONTIN ) 100 MG capsule TAKE ONE CAPSULE BY MOUTH AT BEDTIME       10/07/2023    8:08 AM 08/18/2023    1:43 PM 08/03/2023    2:58 PM 03/02/2023    3:48 PM  GAD 7 : Generalized Anxiety Score  Nervous, Anxious, on Edge 1 3 1 2   Control/stop worrying 1 2 1 1   Worry too much - different things 1 1 1 1   Trouble relaxing 1 2 1 1   Restless 1 2 1  0  Easily annoyed or irritable 1 2 1  0  Afraid - awful might happen 0 2 1 1   Total GAD 7 Score 6 14 7 6   Anxiety Difficulty Somewhat difficult  Somewhat difficult Somewhat difficult       10/07/2023    8:08 AM 08/18/2023    1:42 PM 08/03/2023    2:56 PM  Depression screen PHQ 2/9  Decreased Interest 1 1 0  Down, Depressed, Hopeless 0 1 0  PHQ - 2 Score 1 2 0  Altered sleeping 1 0 1   Tired, decreased energy 1 1 1   Change in appetite 1 2 0  Feeling bad or failure about yourself  0 1 0  Trouble concentrating 1 1 0  Moving slowly or fidgety/restless 1 1 0  Suicidal thoughts 0 0 0  PHQ-9 Score 6 8 2   Difficult doing work/chores Somewhat difficult  Somewhat difficult    BP Readings from Last 3 Encounters:  10/07/23 118/78  08/18/23 122/74  08/18/23 112/82    Physical Exam Vitals and nursing note reviewed.  Constitutional:      Appearance: Normal appearance. He is well-developed.  HENT:     Head: Normocephalic.     Right Ear: Tympanic membrane, ear canal and external ear normal.     Left Ear: Tympanic membrane, ear canal and external ear normal.     Nose: Nose normal.  Eyes:     Conjunctiva/sclera: Conjunctivae normal.     Pupils: Pupils are equal, round, and reactive to light.  Neck:     Thyroid : No thyromegaly.     Vascular: No carotid bruit.  Cardiovascular:     Rate and Rhythm: Normal rate and regular rhythm. No extrasystoles are present.    Pulses: Normal pulses.     Heart sounds: Normal heart sounds.     Comments: Mechanical valve click Pulmonary:     Effort: Pulmonary effort is normal.     Breath sounds: Normal breath sounds. No wheezing.  Chest:  Breasts:    Right: No mass.     Left: No mass.  Abdominal:     General: Bowel sounds are normal.     Palpations: Abdomen is soft.     Tenderness: There is no abdominal tenderness.  Musculoskeletal:        General: Normal range of motion.     Cervical back: Normal range of motion and neck supple.  Lymphadenopathy:     Cervical: No cervical adenopathy.  Skin:    General: Skin is warm and dry.  Neurological:     Mental Status: He is alert and oriented to person, place, and time.     Deep Tendon Reflexes: Reflexes are normal and symmetric.  Psychiatric:  Attention and Perception: Attention normal.        Mood and Affect: Mood normal.        Speech: Speech normal.        Thought Content:  Thought content normal.        Cognition and Memory: Cognition normal.     Wt Readings from Last 3 Encounters:  10/07/23 160 lb (72.6 kg)  08/18/23 172 lb 8 oz (78.2 kg)  08/16/23 174 lb 8 oz (79.2 kg)    BP 118/78   Pulse 66   Ht 5\' 7"  (1.702 m)   Wt 160 lb (72.6 kg)   SpO2 95%   BMI 25.06 kg/m   Assessment and Plan:  Problem List Items Addressed This Visit       Unprioritized   Coronary artery disease involving native coronary artery of native heart (Chronic)   Followed by Cardiology closely On beta blockers and statins      Generalized anxiety disorder (Chronic)   Clinically stable on Buspar  and Xanax .   No SI or HI on evaluation. Plan to continue same medications for now.      Relevant Medications   ALPRAZolam  (XANAX ) 0.5 MG tablet   Mixed hyperlipidemia (Chronic)   LDL is  Lab Results  Component Value Date   LDLCALC 75 08/17/2023   Current regimen is Nexlizet , Fenofibrate  and atorvastatin .  No medication side effects noted. Goal LDL is <55.       Left-sided chest wall pain (Chronic)   Neuropathic pain has improved with gabapentin  100 mg at hs No sedation or other side effects Will increase to 200 mg at bedtime      Relevant Medications   gabapentin  (NEURONTIN ) 100 MG capsule   Chronic GERD   Reflux symptoms are controlled on omeprazole . Patient denies red flag symptoms - no melena, weight loss, dysphagia.       Other Visit Diagnoses       Annual physical exam    -  Primary   continue healthy diet and exercise recent labs were normal/controlled lipids     Immunization due       Relevant Orders   Pneumococcal conjugate vaccine 20-valent (Completed)       No follow-ups on file.    Sheron Dixons, MD [facility] [place]

## 2023-10-07 NOTE — Assessment & Plan Note (Signed)
 Neuropathic pain has improved with gabapentin  100 mg at hs No sedation or other side effects Will increase to 200 mg at bedtime

## 2023-10-07 NOTE — Assessment & Plan Note (Addendum)
 Clinically stable on Buspar  and Xanax .   No SI or HI on evaluation. Plan to continue same medications for now.

## 2023-10-07 NOTE — Assessment & Plan Note (Signed)
 Reflux symptoms are controlled on omeprazole. Patient denies red flag symptoms - no melena, weight loss, dysphagia.

## 2023-10-08 ENCOUNTER — Encounter: Payer: PRIVATE HEALTH INSURANCE | Admitting: Internal Medicine

## 2023-10-11 ENCOUNTER — Ambulatory Visit: Payer: PRIVATE HEALTH INSURANCE | Attending: Cardiology | Admitting: Cardiology

## 2023-10-11 ENCOUNTER — Encounter: Payer: Self-pay | Admitting: Cardiology

## 2023-10-11 VITALS — BP 118/71 | HR 92 | Ht 67.0 in | Wt 180.2 lb

## 2023-10-11 DIAGNOSIS — I2510 Atherosclerotic heart disease of native coronary artery without angina pectoris: Secondary | ICD-10-CM

## 2023-10-11 DIAGNOSIS — E785 Hyperlipidemia, unspecified: Secondary | ICD-10-CM | POA: Diagnosis not present

## 2023-10-11 DIAGNOSIS — Z952 Presence of prosthetic heart valve: Secondary | ICD-10-CM | POA: Diagnosis not present

## 2023-10-11 DIAGNOSIS — Z8679 Personal history of other diseases of the circulatory system: Secondary | ICD-10-CM

## 2023-10-11 DIAGNOSIS — Z9889 Other specified postprocedural states: Secondary | ICD-10-CM

## 2023-10-11 NOTE — Patient Instructions (Signed)
 Medication Instructions:  Your Physician recommend you continue on your current medication as directed.    *If you need a refill on your cardiac medications before your next appointment, please call your pharmacy*  Lab Work: No labs ordered today  If you have labs (blood work) drawn today and your tests are completely normal, you will receive your results only by: MyChart Message (if you have MyChart) OR A paper copy in the mail If you have any lab test that is abnormal or we need to change your treatment, we will call you to review the results.  Testing/Procedures: No test ordered today   Follow-Up: At [facility], you and your health needs are our priority.  As part of our continuing mission to provide you with exceptional heart care, our providers are all part of one team.  This team includes your primary Cardiologist (physician) and Advanced Practice Providers or APPs (Physician Assistants and Nurse Practitioners) who all work together to provide you with the care you need, when you need it.  Your next appointment:   6 month(s)  Provider:   You may see Constancia Delton, MD or one of the following Advanced Practice Providers on your designated Care Team:   Laneta Pintos, NP Gildardo Labrador, PA-C Varney Gentleman, PA-C Cadence [place], PA-C Ronald Cockayne, NP Morey Ar, NP    We recommend signing up for the patient portal called "MyChart".  Sign up information is provided on this After Visit Summary.  MyChart is used to connect with patients for Virtual Visits (Telemedicine).  Patients are able to view lab/test results, encounter notes, upcoming appointments, etc.  Non-urgent messages can be sent to your provider as well.   To learn more about what you can do with MyChart, go to [email or web address].

## 2023-10-11 NOTE — Progress Notes (Signed)
 Cardiology Office Note:    Date:  10/11/2023   ID:  Henry Duncan, DOB 05/11/1991, MRN [id number]  PCP:  Sheron Dixons, MD  [facility] Cardiologist:  Constancia Delton, MD  [facility] Electrophysiologist:  None   Referring MD: Sheron Dixons, MD   No chief complaint on file.   History of Present Illness:    Henry Duncan is a 33 y.o. male with a hx of hypertension, CAD (lhc 05/2021-50% mid LAD), aortic aneurysm, bicuspid aortic valve, ascending thoracic aorta dissection s/p Bentall procedure (with 23 mm St. Jude's mechanical valve, developed pericardial effusion postoperatively and underwent pericardial window 08/2018), mixed hyperlipidemia who presents for follow-up.  Previously seen due to chest pain, Imdur  was increased with minimal effect.  Seen a month or 2 ago due to symptoms of chest discomfort.  Workup for cardiac etiology was unrevealing.  Has since followed up with PCP, gabapentin  started with good effect.  Also has some discomfort with stretching his arms.  Otherwise doing okay, has no concerns at this time.  States not being sure if he could have sternal wire pain.  Prior notes LHC 05/2021 mid LAD 50%.  IFFR 0.95   CCTA 05/2021 severe mid LAD stenosis, calcium  score 958.  FFRct did not show significant stenosis TTE 03/2020 EF 55 to 60%, normal functioning prosthetic valve.  Patient presented to the hospital on 09/13/2018 due to chest pain.  CT did reveal ascending thoracic aorta aneurysm measuring 5.7 to 5.8 cm, localized partial dissection of the anterior middle ascending thoracic aorta.  He was subsequently taken for surgery and underwent Bentall procedure.  Last transthoracic echocardiogram 09/16/2018 showed normal systolic and diastolic function, EF 60 to 65%.  Intraoperative TEE on 09/19/2018 showed normal bioprosthetic valve with normal washing jets, transaortic gradient 7 mmHg.  Coronary CTA 2020, calcium  score 476, moderate mid LAD  disease   Past Medical History:  Diagnosis Date   Allergy    Anxiety    Aortic aneurysm (HCC) 09/16/2018   a. 08/2018 Asc Ao/root aneuryms w/ dissection-->s/p Bentall w/ 26mm Hemashield graft, 23mm SJM AVR, and reimplantation of cor arteries; b. 04/2021 CTA Chest: stable post-op changes.   CAD (coronary artery disease)    a. 08/2018 Cor CTA: Ca2+ 99th%'ile (476). Calcified plaque in mLAD. Nl LM, LCX, RCA. S/p reimplantation of coronary arteries in setting of Bentall; b. 05/2021 Cor CTA: LAD >86m (nl FFRct); c. 05/2021 Cath: LM nl, LAD 40p, 68m, LCX/OM1 min irregs, RCA nl->Med rx.   H/O mechanical aortic valve replacement    a. 08/2018 in setting of bicuspid AoV and Asc Ao dissection-->80mm SJM mech AVR; b. 03/2020 Echo: EF 55-60%. No rwma. Mild LVH. Nl RV fxn. Nl functioning AVR.   Hyperlipidemia    Hypertension    Pericardial effusion    a. 08/2018 post-op -->s/p window.    Past Surgical History:  Procedure Laterality Date   BENTALL PROCEDURE N/A 09/19/2018   Procedure: BENTALL PROCEDURE WITH 23 ST. JUDE GRAFT CONDUIT.;  Surgeon: Bartley Lightning, MD;  Location: [facility] OR;  Service: Open Heart Surgery;  Laterality: N/A;   CARDIAC SURGERY     CLEFT LIP REPAIR     CORONARY ANGIOGRAPHY Left 06/13/2021   Procedure: CORONARY ANGIOGRAPHY (CATH LAB);  Surgeon: Wenona Hamilton, MD;  Location: [facility] INVASIVE CV LAB;  Service: Cardiovascular;  Laterality: Left;   CORONARY ANGIOGRAPHY N/A 08/17/2023   Procedure: CORONARY ANGIOGRAPHY;  Surgeon: Odie Benne, MD;  Location: [facility] INVASIVE CV LAB;  Service: Cardiovascular;  Laterality: N/A;   CORONARY PRESSURE/FFR WITH 3D MAPPING N/A 06/13/2021   Procedure: Coronary Pressure Wire, IFR;  Surgeon: Wenona Hamilton, MD;  Location: [facility] INVASIVE CV LAB;  Service: Cardiovascular;  Laterality: N/A;   ESOPHAGOGASTRODUODENOSCOPY (EGD) WITH PROPOFOL  N/A 05/26/2022   Procedure: ESOPHAGOGASTRODUODENOSCOPY (EGD) WITH PROPOFOL ;  Surgeon: Selena Daily, MD;   Location: [facility] ENDOSCOPY;  Service: Gastroenterology;  Laterality: N/A;   SUBXYPHOID PERICARDIAL WINDOW N/A 09/26/2018   Procedure: SUBXYPHOID PERICARDIAL WINDOW;  Surgeon: Bartley Lightning, MD;  Location: [facility] OR;  Service: Thoracic;  Laterality: N/A;   TEE WITHOUT CARDIOVERSION N/A 09/19/2018   Procedure: TRANSESOPHAGEAL ECHOCARDIOGRAM (TEE);  Surgeon: Bartley Lightning, MD;  Location: [facility] OR;  Service: Open Heart Surgery;  Laterality: N/A;   WISDOM TOOTH EXTRACTION      Current Medications: Current Meds  Medication Sig   ALPRAZolam  (XANAX ) 0.5 MG tablet Take 1 tablet (0.5 mg total) by mouth at bedtime as needed for anxiety.   atorvastatin  (LIPITOR) 80 MG tablet Take 1 tablet (80 mg total) by mouth daily.   Bempedoic Acid -Ezetimibe  (NEXLIZET ) 180-10 MG TABS Take 1 tablet by mouth daily.   bimekizumab-bkzx (BIMZELX) 160 MG/ML pen Inject into the skin every 30 (thirty) days.   busPIRone  (BUSPAR ) 5 MG tablet TAKE ONE TABLET BY MOUTH TWICE A DAY   cetirizine (ZYRTEC) 10 MG tablet Take 10 mg by mouth daily as needed for allergies.   fenofibrate  (TRICOR ) 145 MG tablet Take 1 tablet (145 mg total) by mouth daily. PLEASE CALL OFFICE TO SCHEDULE APPOINTMENT PRIOR TO NEXT REFILL   gabapentin  (NEURONTIN ) 100 MG capsule Take 1-2 capsules (100-200 mg total) by mouth at bedtime.   isosorbide  mononitrate (IMDUR ) 30 MG 24 hr tablet Take 2 tablets (60 mg total) by mouth every morning AND 1 tablet (30 mg total) at bedtime.   metoprolol  succinate (TOPROL -XL) 50 MG 24 hr tablet Take 75 mg by mouth 2 (two) times daily.   omeprazole  (PRILOSEC) 40 MG capsule Take 1 capsule (40 mg total) by mouth daily.   traZODone  (DESYREL ) 50 MG tablet Take 0.5-1 tablets (25-50 mg total) by mouth at bedtime as needed for sleep.   Vitamin D, Ergocalciferol, (DRISDOL) 1.25 MG (50000 UNIT) CAPS capsule Take 50,000 Units by mouth once a week.   warfarin (COUMADIN ) 5 MG tablet Take 1-2 tablets daily or as prescribed by coumadin  clinic    ZORYVE 0.3 % CREA as needed.     Allergies:   Patient has no known allergies.   Social History   Socioeconomic History   Marital status: Married    Spouse name: Not on file   Number of children: 0   Years of education: Not on file   Highest education level: 12th grade  Occupational History   Not on file  Tobacco Use   Smoking status: Never   Smokeless tobacco: Never  Vaping Use   Vaping status: Former  Substance and Sexual Activity   Alcohol use: Not Currently   Drug use: Never   Sexual activity: Yes    Partners: Female    Birth control/protection: Condom  Other Topics Concern   Not on file  Social History Narrative   Not on file   Social Drivers of Health   Financial Resource Strain: Low Risk  (08/03/2023)   Overall Financial Resource Strain (CARDIA)    Difficulty of Paying Living Expenses: Not very hard  Food Insecurity: No Food Insecurity (08/16/2023)   Hunger Vital Sign  Worried About Programme researcher, broadcasting/film/video in the Last Year: Never true    Ran Out of Food in the Last Year: Never true  Transportation Needs: No Transportation Needs (08/16/2023)   PRAPARE - Administrator, Civil Service (Medical): No    Lack of Transportation (Non-Medical): No  Physical Activity: Insufficiently Active (08/03/2023)   Exercise Vital Sign    Days of Exercise per Week: 5 days    Minutes of Exercise per Session: 20 min  Stress: Stress Concern Present (08/03/2023)   [employer] of Occupational Health - Occupational Stress Questionnaire    Feeling of Stress : Rather much  Social Connections: Socially Integrated (08/16/2023)   Social Connection and Isolation Panel [NHANES]    Frequency of Communication with Friends and Family: More than three times a week    Frequency of Social Gatherings with Friends and Family: Twice a week    Attends Religious Services: More than 4 times per year    Active Member of [employer] or Organizations: Yes    Attends Engineer, structural:  More than 4 times per year    Marital Status: Married     Family History: The patient's family history includes Anxiety disorder in his brother and father; Depression in his brother; Diabetes in his father; Heart attack in his mother.  ROS:   Please see the history of present illness.     All other systems reviewed and are negative.  EKGs/Labs/Other Studies Reviewed:    The following studies were reviewed today:   EKG:  EKG not ordered today.   Recent Labs: 08/16/2023: ALT 66 08/17/2023: TSH 1.517 08/18/2023: BUN 13; Creatinine, Ser 0.77; Hemoglobin 15.4; Platelets 185; Potassium 3.9; Sodium 138  Recent Lipid Panel    Component Value Date/Time   CHOL 129 08/17/2023 0752   CHOL 211 (H) 09/30/2021 1118   TRIG 127 08/17/2023 0752   HDL 29 (L) 08/17/2023 0752   HDL 46 09/30/2021 1118   CHOLHDL 4.4 08/17/2023 0752   VLDL 25 08/17/2023 0752   LDLCALC 75 08/17/2023 0752   LDLCALC 133 (H) 09/30/2021 1118     Risk Assessment/Calculations:      Physical Exam:    VS:  BP 118/71 (BP Location: Left Arm, Patient Position: Sitting, Cuff Size: Normal)   Pulse 92   Ht 5\' 7"  (1.702 m)   Wt 180 lb 3.2 oz (81.7 kg)   SpO2 97%   BMI 28.22 kg/m     Wt Readings from Last 3 Encounters:  10/11/23 180 lb 3.2 oz (81.7 kg)  10/07/23 160 lb (72.6 kg)  08/18/23 172 lb 8 oz (78.2 kg)     GEN:  Well nourished, well developed in no acute distress HEENT: Normal NECK: No JVD; No carotid bruits CARDIAC: RRR, 2/6 systolic murmur, mechanical click consistent with mechanical valve noted. RESPIRATORY:  Clear to auscultation without rales, wheezing or rhonchi  ABDOMEN: Soft, non-tender, non-distended MUSCULOSKELETAL:  No edema; No tenderness to palpation of chest. SKIN: Warm and dry NEUROLOGIC:  Alert and oriented x 3 PSYCHIATRIC:  Normal affect   ASSESSMENT:    1. Coronary artery disease involving native coronary artery of native heart, unspecified whether angina present   2. S/P  ascending aortic aneurysm repair   3. H/O mechanical aortic valve replacement   4. Hyperlipidemia LDL goal <70     PLAN:    In order of problems listed above:  CAD, LHC 05/2021 mid LAD 50%.  IFFR 0.95.  Has occasional chest  pain associated with stretching his arms.  Suggesting musculoskeletal etiology.  If symptoms persist, consider CT surgery eval.  Continue Imdur  90 mg daily, Lipitor 80 mg daily, Nexlizet  180-10 mg daily,, warfarin 5 mg daily.  aortic root aneurysm, dissection, s/p Bentall procedure with aortic root replacement and mechanical aortic valve (08/2018).  Chest CT with stable aorta size.  History of mechanical aortic valve replacement, normal aortic valve prosthesis on echo 03/2022.  EF 60 to 65%.  Continue warfarin. hyperlipidemia, Continue Nexlizet , Lipitor  80 mg daily, fenofibrate .    Follow-up in 6 months  Medication Adjustments/Labs and Tests Ordered: Current medicines are reviewed at length with the patient today.  Concerns regarding medicines are outlined above.  No orders of the defined types were placed in this encounter.  No orders of the defined types were placed in this encounter.   Patient Instructions  Medication Instructions:  Your Physician recommend you continue on your current medication as directed.    *If you need a refill on your cardiac medications before your next appointment, please call your pharmacy*  Lab Work: No labs ordered today  If you have labs (blood work) drawn today and your tests are completely normal, you will receive your results only by: MyChart Message (if you have MyChart) OR A paper copy in the mail If you have any lab test that is abnormal or we need to change your treatment, we will call you to review the results.  Testing/Procedures: No test ordered today   Follow-Up: At [facility], you and your health needs are our priority.  As part of our continuing mission to provide you with exceptional heart care, our  providers are all part of one team.  This team includes your primary Cardiologist (physician) and Advanced Practice Providers or APPs (Physician Assistants and Nurse Practitioners) who all work together to provide you with the care you need, when you need it.  Your next appointment:   6 month(s)  Provider:   You may see Constancia Delton, MD or one of the following Advanced Practice Providers on your designated Care Team:   Laneta Pintos, NP Gildardo Labrador, PA-C Varney Gentleman, PA-C Cadence Sublette, PA-C Ronald Cockayne, NP Morey Ar, NP    We recommend signing up for the patient portal called "MyChart".  Sign up information is provided on this After Visit Summary.  MyChart is used to connect with patients for Virtual Visits (Telemedicine).  Patients are able to view lab/test results, encounter notes, upcoming appointments, etc.  Non-urgent messages can be sent to your provider as well.   To learn more about what you can do with MyChart, go to [email or web address].          Signed, Constancia Delton, MD  10/11/2023 10:44 AM    [facility]

## 2023-10-20 ENCOUNTER — Other Ambulatory Visit: Payer: Self-pay

## 2023-10-20 MED ORDER — METOPROLOL SUCCINATE ER 50 MG PO TB24
75.0000 mg | ORAL_TABLET | Freq: Two times a day (BID) | ORAL | 3 refills | Status: DC
Start: 2023-10-20 — End: 2024-02-09

## 2023-10-20 MED ORDER — ATORVASTATIN CALCIUM 80 MG PO TABS: 80.0000 mg | ORAL_TABLET | Freq: Every day | ORAL | 3 refills | Status: AC

## 2023-11-03 ENCOUNTER — Ambulatory Visit: Payer: PRIVATE HEALTH INSURANCE | Attending: Cardiology

## 2023-11-03 DIAGNOSIS — Z5181 Encounter for therapeutic drug level monitoring: Secondary | ICD-10-CM | POA: Diagnosis not present

## 2023-11-03 DIAGNOSIS — Z952 Presence of prosthetic heart valve: Secondary | ICD-10-CM | POA: Diagnosis not present

## 2023-11-03 LAB — POCT INR: INR: 2.9 (ref 2.0–3.0)

## 2023-11-03 NOTE — Patient Instructions (Signed)
Continue warfarin 1.5 tablets daily except 1 tablet Mondays and Fridays. Recheck INR in 6 weeks  [phone]

## 2023-11-07 ENCOUNTER — Other Ambulatory Visit: Payer: Self-pay | Admitting: Internal Medicine

## 2023-11-07 DIAGNOSIS — F5101 Primary insomnia: Secondary | ICD-10-CM

## 2023-11-09 NOTE — Telephone Encounter (Signed)
 Requested Prescriptions  Refused Prescriptions Disp Refills   traZODone  (DESYREL ) 50 MG tablet [Pharmacy Med Name: TRAZODONE  50 MG TAB[*]] 30 tablet 3    Sig: TAKE ONE-HALF TO ONE TABLET BY MOUTH AT BEDTIME AS NEEDED FOR SLEEP     Psychiatry: Antidepressants - Serotonin Modulator Passed - 11/09/2023  4:35 PM      Passed - Valid encounter within last 6 months    Recent Outpatient Visits           1 month ago Annual physical exam   [place] [facility] at [facility], Chales [place], MD   2 months ago Left-sided chest wall pain   [place] [facility] at [facility], Chales [place], MD   3 months ago Primary insomnia   [facility] at [facility], Chales [place], MD

## 2023-11-10 ENCOUNTER — Other Ambulatory Visit: Payer: Self-pay | Admitting: Internal Medicine

## 2023-11-10 ENCOUNTER — Other Ambulatory Visit: Payer: Self-pay | Admitting: Physician Assistant

## 2023-11-10 DIAGNOSIS — F411 Generalized anxiety disorder: Secondary | ICD-10-CM

## 2023-11-11 ENCOUNTER — Telehealth: Payer: Self-pay | Admitting: Cardiology

## 2023-11-11 ENCOUNTER — Other Ambulatory Visit: Payer: Self-pay | Admitting: Internal Medicine

## 2023-11-11 DIAGNOSIS — F5101 Primary insomnia: Secondary | ICD-10-CM

## 2023-11-11 MED ORDER — NEXLIZET 180-10 MG PO TABS: 1.0000 | ORAL_TABLET | Freq: Every day | ORAL | 2 refills | Status: AC

## 2023-11-11 NOTE — Telephone Encounter (Signed)
*  STAT* If patient is at the pharmacy, call can be transferred to refill team.   1. Which medications need to be refilled? (please list name of each medication and dose if known) Bempedoic Acid -Ezetimibe  (NEXLIZET ) 180-10 MG TABS   2. Which pharmacy/location (including street and city if local pharmacy) is medication to be sent to? [facility] [place] [place] - [address]   3. Do they need a 30 day or 90 day supply? [facility] Pharmacy did not receive refills sent back in march, please resend

## 2023-11-11 NOTE — Telephone Encounter (Signed)
 RX sent to requested Pharmacy

## 2023-11-12 ENCOUNTER — Other Ambulatory Visit: Payer: Self-pay | Admitting: Surgery

## 2023-11-12 DIAGNOSIS — Z8679 Personal history of other diseases of the circulatory system: Secondary | ICD-10-CM

## 2023-11-12 DIAGNOSIS — Z9889 Other specified postprocedural states: Secondary | ICD-10-CM

## 2023-11-12 NOTE — Telephone Encounter (Signed)
 Requested Prescriptions  Pending Prescriptions Disp Refills   busPIRone  (BUSPAR ) 5 MG tablet [Pharmacy Med Name: BUSPIRONE  5 MG TAB[*]] 180 tablet 3    Sig: TAKE ONE TABLET BY MOUTH TWICE A DAY     Psychiatry: Anxiolytics/Hypnotics - Non-controlled Passed - 11/12/2023  3:05 PM      Passed - Valid encounter within last 12 months    Recent Outpatient Visits           1 month ago Annual physical exam   [place] [facility] at [facility], Chales [place], MD   2 months ago Left-sided chest wall pain   [place] [facility] at [facility], Chales [place], MD   3 months ago Primary insomnia   [facility] at [facility], Chales [place], MD

## 2023-11-12 NOTE — Telephone Encounter (Signed)
 RN refilled Buspar  , when pt has refills available. Called pharmacy s/w Marily Shows. She will delete refill authorization.

## 2023-11-15 ENCOUNTER — Encounter: Payer: Self-pay | Admitting: Internal Medicine

## 2023-11-15 NOTE — Telephone Encounter (Signed)
 Requested Prescriptions  Pending Prescriptions Disp Refills   traZODone  (DESYREL ) 50 MG tablet [Pharmacy Med Name: TRAZODONE  50 MG TAB[*]] 90 tablet 1    Sig: TAKE ONE-HALF TO ONE TABLET BY MOUTH AT BEDTIME AS NEEDED FOR SLEEP     Psychiatry: Antidepressants - Serotonin Modulator Passed - 11/15/2023 11:49 AM      Passed - Valid encounter within last 6 months    Recent Outpatient Visits           1 month ago Annual physical exam   [place] [facility] at [facility], Leita DEL, MD   2 months ago Left-sided chest wall pain   [place] [facility] at [facility], Leita DEL, MD   3 months ago Primary insomnia   [facility] at [facility], Leita DEL, MD

## 2023-11-17 ENCOUNTER — Ambulatory Visit: Payer: PRIVATE HEALTH INSURANCE | Attending: Surgery | Admitting: Surgery

## 2023-11-17 ENCOUNTER — Encounter: Payer: Self-pay | Admitting: *Deleted

## 2023-11-17 ENCOUNTER — Ambulatory Visit (HOSPITAL_COMMUNITY)
Admission: RE | Admit: 2023-11-17 | Discharge: 2023-11-17 | Disposition: A | Discharge status: Home / Self Care | Payer: PRIVATE HEALTH INSURANCE | Source: Ambulatory Visit | Attending: Cardiology | Admitting: Cardiology

## 2023-11-17 ENCOUNTER — Other Ambulatory Visit: Payer: Self-pay | Admitting: *Deleted

## 2023-11-17 ENCOUNTER — Encounter: Payer: Self-pay | Admitting: Surgery

## 2023-11-17 VITALS — BP 105/70 | HR 64 | Resp 18 | Ht 67.0 in | Wt 187.0 lb

## 2023-11-17 DIAGNOSIS — Z9889 Other specified postprocedural states: Secondary | ICD-10-CM

## 2023-11-17 DIAGNOSIS — R0789 Other chest pain: Secondary | ICD-10-CM

## 2023-11-17 DIAGNOSIS — Z8679 Personal history of other diseases of the circulatory system: Secondary | ICD-10-CM | POA: Insufficient documentation

## 2023-11-17 NOTE — Progress Notes (Signed)
 [address], [place] [address]             [phone]    PCP is Justus Leita DEL, MD Referring Provider is Darliss Rogue, MD  Chief Complaint  Patient presents with   Thoracic Aortic Dissection    Sternal wire removal/CXR    HPI:  The patient is a 33 year old gentleman who underwent right axillary artery cannulation, replacement of a 5.7 cm ascending aortic aneurysm under deep hypothermic circulatory arrest with antegrade cerebral perfusion, and Bentall procedure with a 23 mm St. Jude mechanical valve conduit with reimplantation of both coronary arteries on 09/19/2018 for a bicuspid aortic valve with ascending aortic aneurysm and acute focal type A aortic dissection.  His postoperative course was complicated by development of a significant pericardial effusion and he underwent subxiphoid pericardial window on 09/26/2018.  He made an uneventful recovery after that.  He had a follow-up CT scan of the chest at 1 year which showed that the remainder of his aortic arch and descending thoracic aorta were normal.  I last saw him in May 2021 and he was doing well.  He now returns to discuss removal of his sternal wires due to persistent chest wall discomfort around the sternum.  He has not noticed any mobility of the sternum.  Past Medical History:  Diagnosis Date   Allergy    Anxiety    Aortic aneurysm (HCC) 09/16/2018   a. 08/2018 Asc Ao/root aneuryms w/ dissection-->s/p Bentall w/ 26mm Hemashield graft, 23mm SJM AVR, and reimplantation of cor arteries; b. 04/2021 CTA Chest: stable post-op changes.   CAD (coronary artery disease)    a. 08/2018 Cor CTA: Ca2+ 99th%'ile (476). Calcified plaque in mLAD. Nl LM, LCX, RCA. S/p reimplantation of coronary arteries in setting of Bentall; b. 05/2021 Cor CTA: LAD >51m (nl FFRct); c. 05/2021 Cath: LM nl, LAD 40p, 44m, LCX/OM1 min irregs, RCA nl->Med rx.   H/O mechanical aortic valve replacement    a. 08/2018 in setting of bicuspid  AoV and Asc Ao dissection-->23mm SJM mech AVR; b. 03/2020 Echo: EF 55-60%. No rwma. Mild LVH. Nl RV fxn. Nl functioning AVR.   Hyperlipidemia    Hypertension    Pericardial effusion    a. 08/2018 post-op -->s/p window.    Past Surgical History:  Procedure Laterality Date   BENTALL PROCEDURE N/A 09/19/2018   Procedure: BENTALL PROCEDURE WITH 23 ST. JUDE GRAFT CONDUIT.;  Surgeon: Lucas Dorise POUR, MD;  Location: [facility] OR;  Service: Open Heart Surgery;  Laterality: N/A;   CARDIAC SURGERY     CLEFT LIP REPAIR     CORONARY ANGIOGRAPHY Left 06/13/2021   Procedure: CORONARY ANGIOGRAPHY (CATH LAB);  Surgeon: Darron Deatrice LABOR, MD;  Location: [facility] INVASIVE CV LAB;  Service: Cardiovascular;  Laterality: Left;   CORONARY ANGIOGRAPHY N/A 08/17/2023   Procedure: CORONARY ANGIOGRAPHY;  Surgeon: Verlin Lonni BIRCH, MD;  Location: [facility] INVASIVE CV LAB;  Service: Cardiovascular;  Laterality: N/A;   CORONARY PRESSURE/FFR WITH 3D MAPPING N/A 06/13/2021   Procedure: Coronary Pressure Wire, IFR;  Surgeon: Darron Deatrice LABOR, MD;  Location: [facility] INVASIVE CV LAB;  Service: Cardiovascular;  Laterality: N/A;   ESOPHAGOGASTRODUODENOSCOPY (EGD) WITH PROPOFOL  N/A 05/26/2022   Procedure: ESOPHAGOGASTRODUODENOSCOPY (EGD) WITH PROPOFOL ;  Surgeon: Unk Corinn Skiff, MD;  Location: [facility] ENDOSCOPY;  Service: Gastroenterology;  Laterality: N/A;   SUBXYPHOID PERICARDIAL WINDOW N/A 09/26/2018   Procedure: SUBXYPHOID PERICARDIAL WINDOW;  Surgeon: Lucas Dorise POUR, MD;  Location: [facility]  OR;  Service: Thoracic;  Laterality: N/A;   TEE WITHOUT CARDIOVERSION N/A 09/19/2018   Procedure: TRANSESOPHAGEAL ECHOCARDIOGRAM (TEE);  Surgeon: Lucas Dorise POUR, MD;  Location: [facility] OR;  Service: Open Heart Surgery;  Laterality: N/A;   WISDOM TOOTH EXTRACTION      Family History  Problem Relation Age of Onset   Heart attack Mother    Anxiety disorder Father    Diabetes Father    Depression Brother    Anxiety disorder Brother     Social  History Social History   Tobacco Use   Smoking status: Never   Smokeless tobacco: Never  Vaping Use   Vaping status: Former  Substance Use Topics   Alcohol use: Not Currently   Drug use: Never    Current Outpatient Medications  Medication Sig Dispense Refill   ALPRAZolam  (XANAX ) 0.5 MG tablet Take 1 tablet (0.5 mg total) by mouth at bedtime as needed for anxiety. 20 tablet 3   atorvastatin  (LIPITOR) 80 MG tablet Take 1 tablet (80 mg total) by mouth daily. 90 tablet 3   Bempedoic Acid -Ezetimibe  (NEXLIZET ) 180-10 MG TABS Take 1 tablet by mouth daily. 90 tablet 2   bimekizumab-bkzx (BIMZELX) 160 MG/ML pen Inject into the skin every 30 (thirty) days.     busPIRone  (BUSPAR ) 5 MG tablet TAKE ONE TABLET BY MOUTH TWICE A DAY 180 tablet 3   cetirizine (ZYRTEC) 10 MG tablet Take 10 mg by mouth daily as needed for allergies.     fenofibrate  (TRICOR ) 145 MG tablet Take 1 tablet (145 mg total) by mouth daily. PLEASE CALL OFFICE TO SCHEDULE APPOINTMENT PRIOR TO NEXT REFILL 90 tablet 3   gabapentin  (NEURONTIN ) 100 MG capsule Take 1-2 capsules (100-200 mg total) by mouth at bedtime. 60 capsule 3   isosorbide  mononitrate (IMDUR ) 30 MG 24 hr tablet Take 2 tablets (60 mg total) by mouth every morning AND 1 tablet (30 mg total) at bedtime. 270 tablet 1   metoprolol  succinate (TOPROL -XL) 50 MG 24 hr tablet Take 1.5 tablets (75 mg total) by mouth 2 (two) times daily. 90 tablet 3   omeprazole  (PRILOSEC) 40 MG capsule Take 1 capsule (40 mg total) by mouth daily. 90 capsule 3   traZODone  (DESYREL ) 50 MG tablet TAKE ONE-HALF TO ONE TABLET BY MOUTH AT BEDTIME AS NEEDED FOR SLEEP 90 tablet 1   Vitamin D, Ergocalciferol, (DRISDOL) 1.25 MG (50000 UNIT) CAPS capsule Take 50,000 Units by mouth once a week.     warfarin (COUMADIN ) 5 MG tablet Take 1-2 tablets daily or as prescribed by coumadin  clinic 50 tablet 1   ZORYVE 0.3 % CREA as needed.     No current facility-administered medications for this visit.    No  Known Allergies  Review of Systems  Constitutional:  Negative for activity change and fatigue.  HENT: Negative.    Eyes: Negative.   Respiratory:  Negative for shortness of breath.   Cardiovascular:  Negative for chest pain, palpitations and leg swelling.  Gastrointestinal: Negative.   Endocrine: Negative.   Genitourinary: Negative.   Musculoskeletal:        Chest wall discomfort along both sides of the sternum.  Allergic/Immunologic: Negative.   Neurological:  Negative for dizziness and syncope.  Hematological: Negative.   Psychiatric/Behavioral: Negative.      BP 105/70 (BP Location: Right Arm)   Pulse 64   Resp 18   Ht 5' 7 (1.702 m)   Wt 187 lb (84.8 kg)   SpO2 94%   BMI  29.29 kg/m  Physical Exam Constitutional:      Appearance: Normal appearance.  HENT:     Head: Normocephalic and atraumatic.   Eyes:     Extraocular Movements: Extraocular movements intact.     Pupils: Pupils are equal, round, and reactive to light.   Neck:     Vascular: No carotid bruit.   Cardiovascular:     Rate and Rhythm: Normal rate and regular rhythm.     Heart sounds: No murmur heard.    Comments: Crisp mechanical valve click Pulmonary:     Effort: Pulmonary effort is normal.     Breath sounds: Normal breath sounds.   Musculoskeletal:        General: No swelling.   Skin:    General: Skin is warm and dry.   Neurological:     General: No focal deficit present.     Mental Status: He is alert and oriented to person, place, and time.   Psychiatric:        Mood and Affect: Mood normal.        Behavior: Behavior normal.      Diagnostic Tests:  Narrative & Impression  CLINICAL DATA:  Status post ascending aortic aneurysm repair.   EXAM: CHEST - 2 VIEW   COMPARISON:  Chest x-ray 08/16/2023.   FINDINGS: Sternotomy wires and aortic valve replacement are again seen. The heart size and mediastinal contours are within normal limits. Both lungs are clear. The visualized  skeletal structures are unremarkable.   IMPRESSION: No active cardiopulmonary disease.     Electronically Signed   By: Greig Pique M.D.   On: 11/17/2023 16:35      Impression:  He is having persistent chest wall pain centered around his sternum which he said has been present for the past few years.  He feels that this may be related to the sternal wires and that is certainly possible.  I think it is reasonable to remove the wires to see if this resolves his discomfort.  I discussed the operative procedure with him and the risks which I think are very low.  This will require a brief general anesthesia. I discussed the possibility that this may not resolve his pain and he understands that.  Plan:  He will be scheduled for sternal wire removal on 12/03/2023.  I do not think he needs to stop his Coumadin  for this procedure since these will be tiny incisions and hemostasis should be adequate with direct pressure.  I spent 30 minutes performing this consultation and > 50% of this time was spent face to face counseling and coordinating the care of this patient's chest wall discomfort status post sternotomy.  Dorise MARLA Fellers, MD [facility] [phone]

## 2023-11-30 NOTE — Pre-Procedure Instructions (Signed)
 Surgical Instructions   Your procedure is scheduled on December 03, 2023. Report to [facility] Main Entrance A at 5:30 A.M., then check in with the Admitting office. Any questions or running late day of surgery: call [phone]  Questions prior to your surgery date: call [phone], Monday-Friday, 8am-4pm. If you experience any cold or flu symptoms such as cough, fever, chills, shortness of breath, etc. between now and your scheduled surgery, please notify us  at the above number.     Remember:  Do not eat or drink after midnight the night before your surgery    Take these medicines the morning of surgery with A SIP OF WATER: acetaminophen  (TYLENOL )  atorvastatin  (LIPITOR)  Bempedoic Acid -Ezetimibe  (NEXLIZET )  busPIRone  (BUSPAR )  fenofibrate  (TRICOR )  isosorbide  mononitrate (IMDUR )  metoprolol  succinate (TOPROL -XL)  omeprazole  (PRILOSEC)  warfarin (COUMADIN )    May take these medicines IF NEEDED: ALPRAZolam  (XANAX )    One week prior to surgery, STOP taking any Aspirin  (unless otherwise instructed by your surgeon) Aleve, Naproxen, Ibuprofen, Motrin, Advil, Goody's, BC's, all herbal medications, fish oil, and non-prescription vitamins.                     Do NOT Smoke (Tobacco/Vaping) for 24 hours prior to your procedure.  If you use a CPAP at night, you may bring your mask/headgear for your overnight stay.   You will be asked to remove any contacts, glasses, piercing's, hearing aid's, dentures/partials prior to surgery. Please bring cases for these items if needed.    Patients discharged the day of surgery will not be allowed to drive home, and someone needs to stay with them for 24 hours.  SURGICAL WAITING ROOM VISITATION Patients may have no more than 2 support people in the waiting area - these visitors may rotate.   Pre-op nurse will coordinate an appropriate time for 1 ADULT support person, who may not rotate, to accompany patient in pre-op.  Children under the age  of 5 must have an adult with them who is not the patient and must remain in the main waiting area with an adult.  If the patient needs to stay at the hospital during part of their recovery, the visitor guidelines for inpatient rooms apply.  Please refer to the [facility] website for the visitor guidelines for any additional information.   If you received a COVID test during your pre-op visit  it is requested that you wear a mask when out in public, stay away from anyone that may not be feeling well and notify your surgeon if you develop symptoms. If you have been in contact with anyone that has tested positive in the last 10 days please notify you surgeon.      Pre-operative CHG Bathing Instructions   You can play a key role in reducing the risk of infection after surgery. Your skin needs to be as free of germs as possible. You can reduce the number of germs on your skin by washing with CHG (chlorhexidine  gluconate) soap before surgery. CHG is an antiseptic soap that kills germs and continues to kill germs even after washing.   DO NOT use if you have an allergy to chlorhexidine /CHG or antibacterial soaps. If your skin becomes reddened or irritated, stop using the CHG and notify one of our RNs at [phone].              TAKE A SHOWER THE NIGHT BEFORE SURGERY AND THE DAY OF SURGERY    Please keep in  mind the following:  DO NOT shave, including legs and underarms, 48 hours prior to surgery.   You may shave your face before/day of surgery.  Place clean sheets on your bed the night before surgery Use a clean washcloth (not used since being washed) for each shower. DO NOT sleep with pet's night before surgery.  CHG Shower Instructions:  Wash your face and private area with normal soap. If you choose to wash your hair, wash first with your normal shampoo.  After you use shampoo/soap, rinse your hair and body thoroughly to remove shampoo/soap residue.  Turn the water OFF and apply half the  bottle of CHG soap to a CLEAN washcloth.  Apply CHG soap ONLY FROM YOUR NECK DOWN TO YOUR TOES (washing for 3-5 minutes)  DO NOT use CHG soap on face, private areas, open wounds, or sores.  Pay special attention to the area where your surgery is being performed.  If you are having back surgery, having someone wash your back for you may be helpful. Wait 2 minutes after CHG soap is applied, then you may rinse off the CHG soap.  Pat dry with a clean towel  Put on clean pajamas    Additional instructions for the day of surgery: DO NOT APPLY any lotions, deodorants, cologne, or perfumes.   Do not wear jewelry or makeup Do not wear nail polish, gel polish, artificial nails, or any other type of covering on natural nails (fingers and toes) Do not bring valuables to the hospital. [facility] is not responsible for valuables/personal belongings. Put on clean/comfortable clothes.  Please brush your teeth.  Ask your nurse before applying any prescription medications to the skin.

## 2023-12-01 ENCOUNTER — Inpatient Hospital Stay (HOSPITAL_COMMUNITY)
Admission: RE | Admit: 2023-12-01 | Discharge: 2023-12-01 | Disposition: A | Discharge status: Home / Self Care | Payer: PRIVATE HEALTH INSURANCE | Source: Ambulatory Visit | Attending: Surgery

## 2023-12-01 ENCOUNTER — Ambulatory Visit (HOSPITAL_COMMUNITY)
Admission: RE | Admit: 2023-12-01 | Discharge: 2023-12-01 | Disposition: A | Discharge status: Home / Self Care | Payer: PRIVATE HEALTH INSURANCE | Source: Ambulatory Visit | Attending: Surgery | Admitting: Surgery

## 2023-12-01 ENCOUNTER — Other Ambulatory Visit: Payer: Self-pay

## 2023-12-01 ENCOUNTER — Encounter (HOSPITAL_COMMUNITY): Payer: Self-pay

## 2023-12-01 VITALS — BP 92/69 | HR 65 | Temp 98.1°F | Resp 18 | Ht 67.0 in | Wt 188.0 lb

## 2023-12-01 DIAGNOSIS — Z01818 Encounter for other preprocedural examination: Secondary | ICD-10-CM

## 2023-12-01 DIAGNOSIS — R0789 Other chest pain: Secondary | ICD-10-CM | POA: Diagnosis present

## 2023-12-01 HISTORY — DX: Psoriasis vulgaris: L40.0

## 2023-12-01 LAB — TYPE AND SCREEN
ABO/RH(D): O NEG
Antibody Screen: NEGATIVE

## 2023-12-01 LAB — COMPREHENSIVE METABOLIC PANEL WITH GFR
ALT: 118 U/L — ABNORMAL HIGH (ref 0–44)
AST: 119 U/L — ABNORMAL HIGH (ref 15–41)
Albumin: 3.7 g/dL (ref 3.5–5.0)
Alkaline Phosphatase: 36 U/L — ABNORMAL LOW (ref 38–126)
Anion gap: 15 (ref 5–15)
BUN: 12 mg/dL (ref 6–20)
CO2: 24 mmol/L (ref 22–32)
Calcium: 9 mg/dL (ref 8.9–10.3)
Chloride: 99 mmol/L (ref 98–111)
Creatinine, Ser: 0.96 mg/dL (ref 0.61–1.24)
GFR, Estimated: >60 mL/min (ref >60)
Glucose, Bld: 112 mg/dL — ABNORMAL HIGH (ref 70–99)
Potassium: 4 mmol/L (ref 3.5–5.1)
Sodium: 138 mmol/L (ref 135–145)
Total Bilirubin: 0.5 mg/dL (ref 0.0–1.2)
Total Protein: 6.1 g/dL — ABNORMAL LOW (ref 6.5–8.1)

## 2023-12-01 LAB — URINALYSIS, ROUTINE W REFLEX MICROSCOPIC
Bilirubin Urine: NEGATIVE
Glucose, UA: NEGATIVE mg/dL
Hgb urine dipstick: NEGATIVE
Ketones, ur: NEGATIVE mg/dL
Leukocytes,Ua: NEGATIVE
Nitrite: NEGATIVE
Protein, ur: NEGATIVE mg/dL
Specific Gravity, Urine: 1.017 (ref 1.005–1.030)
pH: 7 (ref 5.0–8.0)

## 2023-12-01 LAB — CBC
HCT: 44 % (ref 39.0–52.0)
Hemoglobin: 14.5 g/dL (ref 13.0–17.0)
MCH: 30.9 pg (ref 26.0–34.0)
MCHC: 33 g/dL (ref 30.0–36.0)
MCV: 93.6 fL (ref 80.0–100.0)
Platelets: 224 K/uL (ref 150–400)
RBC: 4.7 MIL/uL (ref 4.22–5.81)
RDW: 12.5 % (ref 11.5–15.5)
WBC: 8.3 K/uL (ref 4.0–10.5)
nRBC: 0 % (ref 0.0–0.2)

## 2023-12-01 LAB — SURGICAL PCR SCREEN
MRSA, PCR: NEGATIVE
Staphylococcus aureus: POSITIVE — AB

## 2023-12-01 LAB — PROTIME-INR
INR: 2.5 — ABNORMAL HIGH (ref 0.8–1.2)
Prothrombin Time: 28.3 s — ABNORMAL HIGH (ref 11.4–15.2)

## 2023-12-01 LAB — APTT: aPTT: 47 s — ABNORMAL HIGH (ref 24–36)

## 2023-12-01 NOTE — Progress Notes (Signed)
 PCP - Dr. Leita Adie Cardiologist - Dr. Redell Cave, LOV 10/11/2023  PPM/ICD - denies Device Orders - na Rep Notified -   Chest x-ray - PAT, 12/01/2023 EKG - PAT, 12/01/2023 Stress Test - denies ECHO - 08/17/2023 Cardiac Cath - 08/17/2023  Sleep Study - recent sleep test, was not diagnosed with sleep apnea CPAP - na  Non-diabetic  Blood Thinner Instructions: Coumadin , continue per Bernardino Aspirin  Instructions: Denies  ERAS Protcol -NPO  Anesthesia review: Yes. Htn, CAD, Thoracic aortic dissection, continue plavix  Patient denies shortness of breath, fever, cough and chest pain at PAT appointment   All instructions explained to the patient, with a verbal understanding of the material. Patient agrees to go over the instructions while at home for a better understanding. Patient also instructed to self quarantine after being tested for COVID-19. The opportunity to ask questions was provided.

## 2023-12-02 ENCOUNTER — Encounter (HOSPITAL_COMMUNITY): Payer: Self-pay

## 2023-12-02 NOTE — Progress Notes (Signed)
 Anesthesia Chart Review:  33 year old male follows with cardiology and cardiothoracic surgery for history of  replacement of a 5.7 cm ascending aortic aneurysm under deep hypothermic circulatory arrest with antegrade cerebral perfusion, and Bentall procedure with a 23 mm St. Jude mechanical valve conduit with reimplantation of both coronary arteries on 09/19/2018 for a bicuspid aortic valve with ascending aortic aneurysm and acute focal type A aortic dissection.  His postoperative course was complicated by development of a significant pericardial effusion and he underwent subxiphoid pericardial window on 09/26/2018.  Recovery was uneventful after that.  He has known moderate nonobstructive CAD with cath 05/2021 showing 50% mid LAD lesion.  He has had persistent musculoskeletal chest discomfort for several years somewhat responsive to gabapentin .  He was seen by Dr. Lucas on 11/17/2023 and removal of sternal wires was discussed to see if this would help improve his persistent pain.  Per Dr. Jeralyn note, patient does not need to stop Coumadin  for this procedure.  Most recent echo 08/17/2023 showed EF 65 to 70%, normal RV, mechanical aortic valve with mean gradient 22 mmHg  Other pertinent history includes GERD on PPI, plaque psoriasis on Bimzelx, anxiety on alprazolam  PRN.  Preop labs notable for elevated AST/ALT at 119/118.  Remainder of labs unremarkable.  Review of prior blood work shows AST and ALT has been chronically elevated for the past year, generally ~60.  Most recent CMP 08/16/2023 showed AST 65 and ALT 66.  Given recent increase, I reached out to patient's PCP so these can be followed up on after surgery.  EKG 12/01/23: Normal sinus rhythm with sinus arrhythmia. Rate 66.  CHEST - 2 VIEW 12/01/23:   COMPARISON:  Chest radiograph 11/17/2023, most recent CT 11/23/2022   FINDINGS: Prior median sternotomy with intact sternal wires. Prosthetic cardiac valve. Stable heart size and mediastinal contours.  No focal airspace disease, pulmonary edema, pleural effusion or pneumothorax. No acute osseous abnormalities are seen.   IMPRESSION: 1. No acute chest findings. 2. Prior median sternotomy with intact sternal wires.  Cath 08/17/23:   Prox LAD lesion is 40% stenosed.   Mid LAD lesion is 50% stenosed.   Mild proximal LAD stenosis. Moderate proximal LAD stenosis, unchanged from last cath in 2023.  Minimal plaque in the circumflex artery Large dominant RCA with no obstructive disease.    Recommendations: Continue medical management of CAD.   TTE 08/17/23 remain: 1. Left ventricular ejection fraction, by estimation, is 65 to 70%. The  left ventricle has normal function. The left ventricle has no regional  wall motion abnormalities. Left ventricular diastolic parameters were  normal.   2. Right ventricular systolic function is normal. The right ventricular  size is normal.   3. The mitral valve is abnormal. Trivial mitral valve regurgitation. No  evidence of mitral stenosis.   4. Trivial, central aotric regurgitation. Gradients have increased from  2023 study (mean gradient 14 mm Hg). Acceleration time < 100 ms. DVI 0.41.  The aortic valve has been repaired/replaced. Aortic valve regurgitation is  trivial. There is a mechanical  valve present in the aortic position. Aortic valve area, by VTI measures  1.26 cm. Aortic valve mean gradient measures 22.0 mmHg. Aortic valve  acceleration time measures 95 msec.   5. Aortic root/ascending aorta has been repaired/replaced.   6. The inferior vena cava is normal in size with greater than 50%  respiratory variability, suggesting right atrial pressure of 3 mmHg.   Comparison(s): Prior images reviewed side by side. Aotric valve gradients  are higher than in 2023 study. Though a component of this is flow related  (LVOT VTI 21-> 26) DVI has decreased (0.46-> 0.4).      Lynwood Geofm RIGGERS [facility] Short Stay [facility]/Anesthesiology Phone [phone] 12/02/2023 3:43 PM

## 2023-12-02 NOTE — Anesthesia Preprocedure Evaluation (Addendum)
 Anesthesia Evaluation  Patient identified by MRN, date of birth, ID band Patient awake    Reviewed: Allergy & Precautions, H&P , NPO status , Patient's Chart, lab work & pertinent test results  Airway Mallampati: II   Neck ROM: full    Dental   Pulmonary sleep apnea    breath sounds clear to auscultation       Cardiovascular hypertension, + CAD and + Peripheral Vascular Disease   Rhythm:regular Rate:Normal  S/p AVR bentall procedure in 2020 for bicuspid AV and ascending aortic aneurysm with dissection.   Neuro/Psych  Headaches PSYCHIATRIC DISORDERS Anxiety        GI/Hepatic ,GERD  ,,  Endo/Other    Renal/GU      Musculoskeletal   Abdominal   Peds  Hematology   Anesthesia Other Findings   Reproductive/Obstetrics                              Anesthesia Physical Anesthesia Plan  ASA: 3  Anesthesia Plan: General   Post-op Pain Management:    Induction: Intravenous  PONV Risk Score and Plan: 2 and Ondansetron , Dexamethasone , Midazolam  and Treatment may vary due to age or medical condition  Airway Management Planned: Oral ETT  Additional Equipment:   Intra-op Plan:   Post-operative Plan: Extubation in OR  Informed Consent: I have reviewed the patients History and Physical, chart, labs and discussed the procedure including the risks, benefits and alternatives for the proposed anesthesia with the patient or authorized representative who has indicated his/her understanding and acceptance.     Dental advisory given  Plan Discussed with: CRNA, Anesthesiologist and Surgeon  Anesthesia Plan Comments: (PAT note by Lynwood Hope, PA-C: 33 year old male follows with cardiology and cardiothoracic surgery for history of replacement of a 5.7 cm ascending aortic aneurysm under deep hypothermic circulatory arrest with antegrade cerebral perfusion, and Bentall procedure with a 23 mm St. Jude  mechanical valve conduit with reimplantation of both coronary arteries on 09/19/2018 for a bicuspid aortic valve with ascending aortic aneurysm and acute focal type A aortic dissection.  His postoperative course was complicated by development of a significant pericardial effusion and he underwent subxiphoid pericardial window on 09/26/2018.  Recovery was uneventful after that.  He has known moderate nonobstructive CAD with cath 05/2021 showing 50% mid LAD lesion.  He has had persistent musculoskeletal chest discomfort for several years somewhat responsive to gabapentin .  He was seen by Dr. Lucas on 11/17/2023 and removal of sternal wires was discussed to see if this would help improve his persistent pain.  Per Dr. Jeralyn note, patient does not need to stop Coumadin  for this procedure.  Most recent echo 08/17/2023 showed EF 65 to 70%, normal RV, mechanical aortic valve with mean gradient 22 mmHg  Other pertinent history includes GERD on PPI, plaque psoriasis on Bimzelx, anxiety on alprazolam  PRN.  Preop labs notable for elevated AST/ALT at 119/118.  Remainder of labs unremarkable.  Review of prior blood work shows AST and ALT has been chronically elevated for the past year, generally ~60.  Most recent CMP 08/16/2023 showed AST 65 and ALT 66.  Given recent increase, I reached out to patient's PCP so these can be followed up on after surgery.  EKG 12/01/23: Normal sinus rhythm with sinus arrhythmia. Rate 66.  CHEST - 2 VIEW 12/01/23:  COMPARISON:  Chest radiograph 11/17/2023, most recent CT 11/23/2022  FINDINGS: Prior median sternotomy with intact sternal wires. Prosthetic cardiac valve.  Stable heart size and mediastinal contours. No focal airspace disease, pulmonary edema, pleural effusion or pneumothorax. No acute osseous abnormalities are seen.  IMPRESSION: 1. No acute chest findings. 2. Prior median sternotomy with intact sternal wires.  Cath 08/17/23:   Prox LAD lesion is 40% stenosed.   Mid  LAD lesion is 50% stenosed.  Mild proximal LAD stenosis. Moderate proximal LAD stenosis, unchanged from last cath in 2023.  Minimal plaque in the circumflex artery Large dominant RCA with no obstructive disease.   Recommendations: Continue medical management of CAD.   TTE 08/17/23 remain: 1. Left ventricular ejection fraction, by estimation, is 65 to 70%. The  left ventricle has normal function. The left ventricle has no regional  wall motion abnormalities. Left ventricular diastolic parameters were  normal.  2. Right ventricular systolic function is normal. The right ventricular  size is normal.  3. The mitral valve is abnormal. Trivial mitral valve regurgitation. No  evidence of mitral stenosis.  4. Trivial, central aotric regurgitation. Gradients have increased from  2023 study (mean gradient 14 mm Hg). Acceleration time < 100 ms. DVI 0.41.  The aortic valve has been repaired/replaced. Aortic valve regurgitation is  trivial. There is a mechanical  valve present in the aortic position. Aortic valve area, by VTI measures  1.26 cm. Aortic valve mean gradient measures 22.0 mmHg. Aortic valve  acceleration time measures 95 msec.  5. Aortic root/ascending aorta has been repaired/replaced.  6. The inferior vena cava is normal in size with greater than 50%  respiratory variability, suggesting right atrial pressure of 3 mmHg.   Comparison(s): Prior images reviewed side by side. Aotric valve gradients  are higher than in 2023 study. Though a component of this is flow related  (LVOT VTI 21-> 26) DVI has decreased (0.46-> 0.4).    )         Anesthesia Quick Evaluation

## 2023-12-03 ENCOUNTER — Other Ambulatory Visit: Payer: Self-pay

## 2023-12-03 ENCOUNTER — Other Ambulatory Visit (HOSPITAL_COMMUNITY): Payer: Self-pay

## 2023-12-03 ENCOUNTER — Encounter (HOSPITAL_COMMUNITY)
Admission: RE | Disposition: A | Discharge status: Home / Self Care | Payer: Self-pay | Source: Home / Self Care | Attending: Surgery

## 2023-12-03 ENCOUNTER — Encounter (HOSPITAL_COMMUNITY): Payer: Self-pay | Admitting: Surgery

## 2023-12-03 ENCOUNTER — Ambulatory Visit (HOSPITAL_COMMUNITY)
Admission: RE | Admit: 2023-12-03 | Discharge: 2023-12-03 | Disposition: A | Discharge status: Home / Self Care | Payer: PRIVATE HEALTH INSURANCE | Attending: Surgery | Admitting: Surgery

## 2023-12-03 ENCOUNTER — Ambulatory Visit (HOSPITAL_BASED_OUTPATIENT_CLINIC_OR_DEPARTMENT_OTHER): Payer: PRIVATE HEALTH INSURANCE | Admitting: Anesthesiology

## 2023-12-03 ENCOUNTER — Ambulatory Visit (HOSPITAL_COMMUNITY): Payer: PRIVATE HEALTH INSURANCE | Admitting: Anesthesiology

## 2023-12-03 DIAGNOSIS — I739 Peripheral vascular disease, unspecified: Secondary | ICD-10-CM | POA: Diagnosis not present

## 2023-12-03 DIAGNOSIS — K219 Gastro-esophageal reflux disease without esophagitis: Secondary | ICD-10-CM | POA: Diagnosis not present

## 2023-12-03 DIAGNOSIS — T8484XA Pain due to internal orthopedic prosthetic devices, implants and grafts, initial encounter: Secondary | ICD-10-CM | POA: Diagnosis not present

## 2023-12-03 DIAGNOSIS — G4730 Sleep apnea, unspecified: Secondary | ICD-10-CM | POA: Insufficient documentation

## 2023-12-03 DIAGNOSIS — Z952 Presence of prosthetic heart valve: Secondary | ICD-10-CM | POA: Insufficient documentation

## 2023-12-03 DIAGNOSIS — G4733 Obstructive sleep apnea (adult) (pediatric): Secondary | ICD-10-CM

## 2023-12-03 DIAGNOSIS — F419 Anxiety disorder, unspecified: Secondary | ICD-10-CM | POA: Diagnosis not present

## 2023-12-03 DIAGNOSIS — I2510 Atherosclerotic heart disease of native coronary artery without angina pectoris: Secondary | ICD-10-CM | POA: Insufficient documentation

## 2023-12-03 DIAGNOSIS — R072 Precordial pain: Secondary | ICD-10-CM | POA: Diagnosis not present

## 2023-12-03 DIAGNOSIS — R0789 Other chest pain: Secondary | ICD-10-CM | POA: Insufficient documentation

## 2023-12-03 DIAGNOSIS — Z8679 Personal history of other diseases of the circulatory system: Secondary | ICD-10-CM | POA: Insufficient documentation

## 2023-12-03 DIAGNOSIS — I10 Essential (primary) hypertension: Secondary | ICD-10-CM | POA: Insufficient documentation

## 2023-12-03 DIAGNOSIS — Z9889 Other specified postprocedural states: Secondary | ICD-10-CM | POA: Diagnosis not present

## 2023-12-03 HISTORY — PX: STERNAL WIRES REMOVAL: SHX2441

## 2023-12-03 SURGERY — REMOVAL, STERNAL WIRE
Anesthesia: General

## 2023-12-03 MED ORDER — 0.9 % SODIUM CHLORIDE (POUR BTL) OPTIME
TOPICAL | Status: DC | PRN
  Administered 2023-12-03: 2000 mL

## 2023-12-03 MED ORDER — DEXAMETHASONE SODIUM PHOSPHATE 10 MG/ML IJ SOLN
INTRAMUSCULAR | Status: AC
  Filled 2023-12-03: qty 1

## 2023-12-03 MED ORDER — FENTANYL CITRATE (PF) 100 MCG/2ML IJ SOLN
INTRAMUSCULAR | Status: AC
  Filled 2023-12-03: qty 2

## 2023-12-03 MED ORDER — ONDANSETRON HCL 4 MG/2ML IJ SOLN: 4.0000 mg | Freq: Four times a day (QID) | INTRAMUSCULAR | Status: DC | PRN

## 2023-12-03 MED ORDER — OXYCODONE HCL 5 MG PO TABS
5.0000 mg | ORAL_TABLET | Freq: Once | ORAL | Status: AC | PRN
  Administered 2023-12-03: 5 mg via ORAL

## 2023-12-03 MED ORDER — DEXAMETHASONE SODIUM PHOSPHATE 10 MG/ML IJ SOLN
INTRAMUSCULAR | Status: DC | PRN
  Administered 2023-12-03: 5 mg via INTRAVENOUS

## 2023-12-03 MED ORDER — SUGAMMADEX SODIUM 200 MG/2ML IV SOLN
INTRAVENOUS | Status: AC
  Filled 2023-12-03: qty 2

## 2023-12-03 MED ORDER — ONDANSETRON HCL 4 MG/2ML IJ SOLN
INTRAMUSCULAR | Status: DC | PRN
  Administered 2023-12-03: 4 mg via INTRAVENOUS

## 2023-12-03 MED ORDER — OXYCODONE HCL 5 MG/5ML PO SOLN: 5.0000 mg | Freq: Once | ORAL | Status: AC | PRN

## 2023-12-03 MED ORDER — OXYCODONE HCL 5 MG PO TABS
ORAL_TABLET | ORAL | Status: AC
  Filled 2023-12-03: qty 1

## 2023-12-03 MED ORDER — CHLORHEXIDINE GLUCONATE 0.12 % MT SOLN
15.0000 mL | Freq: Once | OROMUCOSAL | Status: AC
  Administered 2023-12-03: 15 mL via OROMUCOSAL
  Filled 2023-12-03: qty 15

## 2023-12-03 MED ORDER — ONDANSETRON HCL 4 MG/2ML IJ SOLN
INTRAMUSCULAR | Status: AC
  Filled 2023-12-03: qty 2

## 2023-12-03 MED ORDER — ROCURONIUM BROMIDE 10 MG/ML (PF) SYRINGE
PREFILLED_SYRINGE | INTRAVENOUS | Status: DC | PRN
  Administered 2023-12-03: 50 mg via INTRAVENOUS

## 2023-12-03 MED ORDER — EPHEDRINE 5 MG/ML INJ
INTRAVENOUS | Status: AC
  Filled 2023-12-03: qty 5

## 2023-12-03 MED ORDER — FENTANYL CITRATE (PF) 250 MCG/5ML IJ SOLN
INTRAMUSCULAR | Status: DC | PRN
  Administered 2023-12-03: 100 ug via INTRAVENOUS

## 2023-12-03 MED ORDER — ORAL CARE MOUTH RINSE: 15.0000 mL | Freq: Once | OROMUCOSAL | Status: AC

## 2023-12-03 MED ORDER — LIDOCAINE 2% (20 MG/ML) 5 ML SYRINGE
INTRAMUSCULAR | Status: DC | PRN
  Administered 2023-12-03: 60 mg via INTRAVENOUS

## 2023-12-03 MED ORDER — EPHEDRINE SULFATE-NACL 50-0.9 MG/10ML-% IV SOSY
PREFILLED_SYRINGE | INTRAVENOUS | Status: DC | PRN
  Administered 2023-12-03: 10 mg via INTRAVENOUS

## 2023-12-03 MED ORDER — FENTANYL CITRATE (PF) 250 MCG/5ML IJ SOLN
INTRAMUSCULAR | Status: AC
  Filled 2023-12-03: qty 5

## 2023-12-03 MED ORDER — MIDAZOLAM HCL 2 MG/2ML IJ SOLN
INTRAMUSCULAR | Status: DC | PRN
  Administered 2023-12-03: 2 mg via INTRAVENOUS

## 2023-12-03 MED ORDER — PHENYLEPHRINE 80 MCG/ML (10ML) SYRINGE FOR IV PUSH (FOR BLOOD PRESSURE SUPPORT)
PREFILLED_SYRINGE | INTRAVENOUS | Status: AC
  Filled 2023-12-03: qty 10

## 2023-12-03 MED ORDER — FENTANYL CITRATE (PF) 100 MCG/2ML IJ SOLN
25.0000 ug | INTRAMUSCULAR | Status: DC | PRN
  Administered 2023-12-03: 25 ug via INTRAVENOUS

## 2023-12-03 MED ORDER — LIDOCAINE 2% (20 MG/ML) 5 ML SYRINGE
INTRAMUSCULAR | Status: AC
  Filled 2023-12-03: qty 5

## 2023-12-03 MED ORDER — OXYCODONE HCL 5 MG PO TABS
5.0000 mg | ORAL_TABLET | Freq: Four times a day (QID) | ORAL | 0 refills | Status: DC | PRN
  Filled 2023-12-03: qty 28, 7d supply, fill #0

## 2023-12-03 MED ORDER — LACTATED RINGERS IV SOLN: INTRAVENOUS | Status: DC

## 2023-12-03 MED ORDER — PHENYLEPHRINE 80 MCG/ML (10ML) SYRINGE FOR IV PUSH (FOR BLOOD PRESSURE SUPPORT)
PREFILLED_SYRINGE | INTRAVENOUS | Status: DC | PRN
  Administered 2023-12-03: 160 ug via INTRAVENOUS

## 2023-12-03 MED ORDER — PHENYLEPHRINE HCL-NACL 20-0.9 MG/250ML-% IV SOLN
INTRAVENOUS | Status: DC | PRN
  Administered 2023-12-03: 50 ug/min via INTRAVENOUS

## 2023-12-03 MED ORDER — MIDAZOLAM HCL 2 MG/2ML IJ SOLN
INTRAMUSCULAR | Status: AC
  Filled 2023-12-03: qty 2

## 2023-12-03 MED ORDER — ROCURONIUM BROMIDE 10 MG/ML (PF) SYRINGE
PREFILLED_SYRINGE | INTRAVENOUS | Status: AC
  Filled 2023-12-03: qty 10

## 2023-12-03 MED ORDER — PROPOFOL 10 MG/ML IV BOLUS
INTRAVENOUS | Status: AC
  Filled 2023-12-03: qty 20

## 2023-12-03 MED ORDER — SUGAMMADEX SODIUM 200 MG/2ML IV SOLN
INTRAVENOUS | Status: DC | PRN
  Administered 2023-12-03: 200 mg via INTRAVENOUS

## 2023-12-03 MED ORDER — CEFAZOLIN SODIUM-DEXTROSE 2-4 GM/100ML-% IV SOLN
2.0000 g | INTRAVENOUS | Status: AC
  Administered 2023-12-03: 2 g via INTRAVENOUS
  Filled 2023-12-03: qty 100

## 2023-12-03 MED ORDER — ALBUMIN HUMAN 5 % IV SOLN: INTRAVENOUS | Status: DC | PRN

## 2023-12-03 MED ORDER — PROPOFOL 10 MG/ML IV BOLUS
INTRAVENOUS | Status: DC | PRN
  Administered 2023-12-03: 170 mg via INTRAVENOUS

## 2023-12-03 SURGICAL SUPPLY — 43 items
ATTRACTOMAT 16X20 MAGNETIC DRP (DRAPES) ×1 IMPLANT
BAG DECANTER FOR FLEXI CONT (MISCELLANEOUS) ×1 IMPLANT
BAND RUBBER #18 3X1/16 STRL (MISCELLANEOUS) IMPLANT
BLADE CLIPPER SURG (BLADE) ×1 IMPLANT
BNDG GAUZE DERMACEA FLUFF 4 (GAUZE/BANDAGES/DRESSINGS) IMPLANT
CANISTER SUCTION 3000ML PPV (SUCTIONS) ×1 IMPLANT
CATH THORACIC 28FR RT ANG (CATHETERS) IMPLANT
CATH THORACIC 36FR (CATHETERS) IMPLANT
CATH THORACIC 36FR RT ANG (CATHETERS) IMPLANT
CLIP TI WIDE RED SMALL 24 (CLIP) IMPLANT
CNTNR URN SCR LID CUP LEK RST (MISCELLANEOUS) IMPLANT
COVER SURGICAL LIGHT HANDLE (MISCELLANEOUS) ×2 IMPLANT
DERMABOND ADVANCED .7 DNX12 (GAUZE/BANDAGES/DRESSINGS) IMPLANT
DERMABOND ADVANCED .7 DNX6 (GAUZE/BANDAGES/DRESSINGS) IMPLANT
DRAPE LAPAROSCOPIC ABDOMINAL (DRAPES) ×1 IMPLANT
DRAPE WARM FLUID 44X44 (DRAPES) IMPLANT
DRSG COVADERM 4X10 (GAUZE/BANDAGES/DRESSINGS) IMPLANT
ELECTRODE REM PT RTRN 9FT ADLT (ELECTROSURGICAL) ×1 IMPLANT
GAUZE XEROFORM 5X9 LF (GAUZE/BANDAGES/DRESSINGS) IMPLANT
GLOVE SURG MICRO LTX SZ7 (GLOVE) ×1 IMPLANT
GOWN STRL REUS W/ TWL LRG LVL3 (GOWN DISPOSABLE) ×4 IMPLANT
GOWN STRL REUS W/ TWL XL LVL3 (GOWN DISPOSABLE) ×1 IMPLANT
KIT BASIN OR (CUSTOM PROCEDURE TRAY) ×1 IMPLANT
KIT SUCTION CATH 14FR (SUCTIONS) IMPLANT
KIT TURNOVER KIT B (KITS) ×1 IMPLANT
MARKER SKIN DUAL TIP RULER LAB (MISCELLANEOUS) IMPLANT
NS IRRIG 1000ML POUR BTL (IV SOLUTION) ×1 IMPLANT
PACK CHEST (CUSTOM PROCEDURE TRAY) ×1 IMPLANT
PAD ARMBOARD POSITIONER FOAM (MISCELLANEOUS) ×2 IMPLANT
PIN SAFETY STERILE (MISCELLANEOUS) IMPLANT
SET HNDPC FAN SPRY TIP SCT (DISPOSABLE) IMPLANT
SOL PREP POV-IOD 4OZ 10% (MISCELLANEOUS) IMPLANT
SUT STEEL 6MS V (SUTURE) IMPLANT
SUT STEEL STERNAL CCS#1 18IN (SUTURE) IMPLANT
SUT STEEL SZ 6 DBL 3X14 BALL (SUTURE) IMPLANT
SUT VIC AB 1 CTX36XBRD ANBCTR (SUTURE) IMPLANT
SUT VIC AB 2-0 CTX 36 (SUTURE) IMPLANT
SUT VIC AB 3-0 SH 27X BRD (SUTURE) IMPLANT
SUT VIC AB 3-0 X1 27 (SUTURE) IMPLANT
SWAB COLLECTION DEVICE MRSA (MISCELLANEOUS) IMPLANT
TOWEL GREEN STERILE (TOWEL DISPOSABLE) ×1 IMPLANT
TOWEL GREEN STERILE FF (TOWEL DISPOSABLE) ×1 IMPLANT
WATER STERILE IRR 1000ML POUR (IV SOLUTION) ×1 IMPLANT

## 2023-12-03 NOTE — Interval H&P Note (Signed)
 History and Physical Interval Note:  12/03/2023 6:36 AM  Henry Duncan  has presented today for surgery, with the diagnosis of sternal pain.  The various methods of treatment have been discussed with the patient and family. After consideration of risks, benefits and other options for treatment, the patient has consented to  Procedure(s): REMOVAL, STERNAL WIRE (N/A) as a surgical intervention.  The patient's history has been reviewed, patient examined, no change in status, stable for surgery.  I have reviewed the patient's chart and labs.  Questions were answered to the patient's satisfaction.     Banjamin Stovall K Ambrose Wile

## 2023-12-03 NOTE — Op Note (Signed)
 CARDIOVASCULAR SURGERY OPERATIVE NOTE  12/03/2023 Henry Duncan [id number]  Surgeon:  Dorise LOIS Fellers, MD  First Assistant: none   Preoperative Diagnosis: Persistent sternal discomfort s/p sternotomy 09/19/2018   Postoperative Diagnosis:  Same   Procedure:  Removal of sternal wires  Anesthesia:  General Endotracheal   Clinical History/Surgical Indication:  He is having persistent chest wall pain centered around his sternum which he said has been present for the past few years. He feels that this may be related to the sternal wires and that is certainly possible. I think it is reasonable to remove the wires to see if this resolves his discomfort. I discussed the operative procedure with him and the risks which I think are very low. This will require a brief general anesthesia. I discussed the possibility that this may not resolve his pain and he understands that.   Preparation:  The patient was seen in the preoperative holding area and the correct patient, correct operation were confirmed with the patient after reviewing the medical record and catheterization. The consent was signed by me. Preoperative antibiotics were given.  The patient was taken back to the operating room and positioned supine on the operating room table. After being placed under general endotracheal anesthesia by the anesthesia team the chest was prepped with betadine soap and solution and draped in the usual sterile manner. A surgical time-out was taken and the correct patient and operative procedure were confirmed with the nursing and anesthesia staff.   Removal of sternal wires:  Three 1 cm incisions were placed along the previous midline scar over the manubrium and middle and lower sternum. Dissection was continued down to the sternum and the wires were located, untwisted and removed without difficulty. All 7 wires were removed. Hemostasis was complete. The incisions were closed with 3-0 Vicryl  subcutaneous suture and 3-0 Vicryl subcuticular skin closure. Dermabond was applied and a dry sterile dressing. All sponge, needle and instrument counts were correct per the nurses. The patient was awakened, extubated and transported to the PACU in satisfactory condition.

## 2023-12-03 NOTE — H&P (Signed)
 [address] [place] [address]             [phone]      Cardiothoracic Surgery History and Physical   PCP is Justus Leita DEL, MD Referring Provider is Darliss Rogue, MD       Chief Complaint  Patient presents with chest wall pain from sternal wires             HPI:   The patient is a 33 year old gentleman who underwent right axillary artery cannulation, replacement of a 5.7 cm ascending aortic aneurysm under deep hypothermic circulatory arrest with antegrade cerebral perfusion, and Bentall procedure with a 23 mm St. Jude mechanical valve conduit with reimplantation of both coronary arteries on 09/19/2018 for a bicuspid aortic valve with ascending aortic aneurysm and acute focal type A aortic dissection.  His postoperative course was complicated by development of a significant pericardial effusion and Henry Duncan underwent subxiphoid pericardial window on 09/26/2018.  Henry Duncan made an uneventful recovery after that.  Henry Duncan had a follow-up CT scan of the chest at 1 year which showed that the remainder of his aortic arch and descending thoracic aorta were normal.  I last saw him in May 2021 and Henry Duncan was doing well.  Henry Duncan now returns to discuss removal of his sternal wires due to persistent chest wall discomfort around the sternum.  Henry Duncan has not noticed any mobility of the sternum.       Past Medical History:  Diagnosis Date   Allergy     Anxiety     Aortic aneurysm (HCC) 09/16/2018    a. 08/2018 Asc Ao/root aneuryms w/ dissection-->s/p Bentall w/ 26mm Hemashield graft, 23mm SJM AVR, and reimplantation of cor arteries; b. 04/2021 CTA Chest: stable post-op changes.   CAD (coronary artery disease)      a. 08/2018 Cor CTA: Ca2+ 99th%'ile (476). Calcified plaque in mLAD. Nl LM, LCX, RCA. S/p reimplantation of coronary arteries in setting of Bentall; b. 05/2021 Cor CTA: LAD >85m (nl FFRct); c. 05/2021 Cath: LM nl, LAD 40p, 22m, LCX/OM1 min irregs, RCA nl->Med rx.   H/O mechanical  aortic valve replacement      a. 08/2018 in setting of bicuspid AoV and Asc Ao dissection-->63mm SJM mech AVR; b. 03/2020 Echo: EF 55-60%. No rwma. Mild LVH. Nl RV fxn. Nl functioning AVR.   Hyperlipidemia     Hypertension     Pericardial effusion      a. 08/2018 post-op -->s/p window.               Past Surgical History:  Procedure Laterality Date   BENTALL PROCEDURE N/A 09/19/2018    Procedure: BENTALL PROCEDURE WITH 23 ST. JUDE GRAFT CONDUIT.;  Surgeon: Lucas Dorise POUR, MD;  Location: [facility] OR;  Service: Open Heart Surgery;  Laterality: N/A;   CARDIAC SURGERY       CLEFT LIP REPAIR       CORONARY ANGIOGRAPHY Left 06/13/2021    Procedure: CORONARY ANGIOGRAPHY (CATH LAB);  Surgeon: Darron Deatrice LABOR, MD;  Location: [facility] INVASIVE CV LAB;  Service: Cardiovascular;  Laterality: Left;   CORONARY ANGIOGRAPHY N/A 08/17/2023    Procedure: CORONARY ANGIOGRAPHY;  Surgeon: Verlin Lonni BIRCH, MD;  Location: [facility] INVASIVE CV LAB;  Service: Cardiovascular;  Laterality: N/A;   CORONARY PRESSURE/FFR WITH 3D MAPPING N/A 06/13/2021    Procedure: Coronary Pressure Wire, IFR;  Surgeon: Darron Deatrice LABOR, MD;  Location: [facility] INVASIVE CV LAB;  Service: Cardiovascular;  Laterality:  N/A;   ESOPHAGOGASTRODUODENOSCOPY (EGD) WITH PROPOFOL  N/A 05/26/2022    Procedure: ESOPHAGOGASTRODUODENOSCOPY (EGD) WITH PROPOFOL ;  Surgeon: Unk Corinn Skiff, MD;  Location: [facility] ENDOSCOPY;  Service: Gastroenterology;  Laterality: N/A;   SUBXYPHOID PERICARDIAL WINDOW N/A 09/26/2018    Procedure: SUBXYPHOID PERICARDIAL WINDOW;  Surgeon: Lucas Dorise POUR, MD;  Location: [facility] OR;  Service: Thoracic;  Laterality: N/A;   TEE WITHOUT CARDIOVERSION N/A 09/19/2018    Procedure: TRANSESOPHAGEAL ECHOCARDIOGRAM (TEE);  Surgeon: Lucas Dorise POUR, MD;  Location: [facility] OR;  Service: Open Heart Surgery;  Laterality: N/A;   WISDOM TOOTH EXTRACTION                   Family History  Problem Relation Age of Onset   Heart attack Mother     Anxiety  disorder Father     Diabetes Father     Depression Brother     Anxiety disorder Brother            Social History Social History  Social History        Tobacco Use   Smoking status: Never   Smokeless tobacco: Never  Vaping Use   Vaping status: Former  Substance Use Topics   Alcohol use: Not Currently   Drug use: Never              Current Outpatient Medications  Medication Sig Dispense Refill   ALPRAZolam  (XANAX ) 0.5 MG tablet Take 1 tablet (0.5 mg total) by mouth at bedtime as needed for anxiety. 20 tablet 3   atorvastatin  (LIPITOR) 80 MG tablet Take 1 tablet (80 mg total) by mouth daily. 90 tablet 3   Bempedoic Acid -Ezetimibe  (NEXLIZET ) 180-10 MG TABS Take 1 tablet by mouth daily. 90 tablet 2   bimekizumab-bkzx (BIMZELX) 160 MG/ML pen Inject into the skin every 30 (thirty) days.       busPIRone  (BUSPAR ) 5 MG tablet TAKE ONE TABLET BY MOUTH TWICE A DAY 180 tablet 3   cetirizine (ZYRTEC) 10 MG tablet Take 10 mg by mouth daily as needed for allergies.       fenofibrate  (TRICOR ) 145 MG tablet Take 1 tablet (145 mg total) by mouth daily. PLEASE CALL OFFICE TO SCHEDULE APPOINTMENT PRIOR TO NEXT REFILL 90 tablet 3   gabapentin  (NEURONTIN ) 100 MG capsule Take 1-2 capsules (100-200 mg total) by mouth at bedtime. 60 capsule 3   isosorbide  mononitrate (IMDUR ) 30 MG 24 hr tablet Take 2 tablets (60 mg total) by mouth every morning AND 1 tablet (30 mg total) at bedtime. 270 tablet 1   metoprolol  succinate (TOPROL -XL) 50 MG 24 hr tablet Take 1.5 tablets (75 mg total) by mouth 2 (two) times daily. 90 tablet 3   omeprazole  (PRILOSEC) 40 MG capsule Take 1 capsule (40 mg total) by mouth daily. 90 capsule 3   traZODone  (DESYREL ) 50 MG tablet TAKE ONE-HALF TO ONE TABLET BY MOUTH AT BEDTIME AS NEEDED FOR SLEEP 90 tablet 1   Vitamin D, Ergocalciferol, (DRISDOL) 1.25 MG (50000 UNIT) CAPS capsule Take 50,000 Units by mouth once a week.       warfarin (COUMADIN ) 5 MG tablet Take 1-2 tablets daily or  as prescribed by coumadin  clinic 50 tablet 1   ZORYVE 0.3 % CREA as needed.          No current facility-administered medications for this visit.        Allergies  No Known Allergies     Review of Systems  Constitutional:  Negative for activity change and  fatigue.  HENT: Negative.    Eyes: Negative.   Respiratory:  Negative for shortness of breath.   Cardiovascular:  Negative for chest pain, palpitations and leg swelling.  Gastrointestinal: Negative.   Endocrine: Negative.   Genitourinary: Negative.   Musculoskeletal:        Chest wall discomfort along both sides of the sternum.  Allergic/Immunologic: Negative.   Neurological:  Negative for dizziness and syncope.  Hematological: Negative.   Psychiatric/Behavioral: Negative.        BP 105/70 (BP Location: Right Arm)   Pulse 64   Resp 18   Ht 5' 7 (1.702 m)   Wt 187 lb (84.8 kg)   SpO2 94%   BMI 29.29 kg/m  Physical Exam Constitutional:      Appearance: Normal appearance.  HENT:     Head: Normocephalic and atraumatic.    Eyes:     Extraocular Movements: Extraocular movements intact.     Pupils: Pupils are equal, round, and reactive to light.    Neck:     Vascular: No carotid bruit.    Cardiovascular:     Rate and Rhythm: Normal rate and regular rhythm.     Heart sounds: No murmur heard.    Comments: Crisp mechanical valve click Pulmonary:     Effort: Pulmonary effort is normal.     Breath sounds: Normal breath sounds.    Musculoskeletal:        General: No swelling.    Skin:    General: Skin is warm and dry.    Neurological:     General: No focal deficit present.     Mental Status: Henry Duncan is alert and oriented to person, place, and time.    Psychiatric:        Mood and Affect: Mood normal.        Behavior: Behavior normal.          Diagnostic Tests:   Narrative & Impression  CLINICAL DATA:  Status post ascending aortic aneurysm repair.   EXAM: CHEST - 2 VIEW   COMPARISON:  Chest x-ray  08/16/2023.   FINDINGS: Sternotomy wires and aortic valve replacement are again seen. The heart size and mediastinal contours are within normal limits. Both lungs are clear. The visualized skeletal structures are unremarkable.   IMPRESSION: No active cardiopulmonary disease.     Electronically Signed   By: Greig Pique M.D.   On: 11/17/2023 16:35        Impression:   Henry Duncan is having persistent chest wall pain centered around his sternum which Henry Duncan said has been present for the past few years.  Henry Duncan feels that this may be related to the sternal wires and that is certainly possible.  I think it is reasonable to remove the wires to see if this resolves his discomfort.  I discussed the operative procedure with him and the risks which I think are very low.  This will require a brief general anesthesia. I discussed the possibility that this may not resolve his pain and Henry Duncan understands that.   Plan:   Sternal wire removal.   Dorise MARLA Fellers, MD [facility] [phone]

## 2023-12-03 NOTE — Discharge Instructions (Signed)
 May remove dressing at home and shower.  Incisions coated with Dermabond and are waterproof.  Do not apply any creams or lotions to incisions.  No activity restrictions.

## 2023-12-03 NOTE — Anesthesia Procedure Notes (Signed)
 Procedure Name: Intubation Date/Time: 12/03/2023 7:23 AM  Performed by: Delores Dus, CRNAPre-anesthesia Checklist: Patient identified, Emergency Drugs available, Suction available and Patient being monitored Patient Re-evaluated:Patient Re-evaluated prior to induction Oxygen Delivery Method: Circle system utilized Preoxygenation: Pre-oxygenation with 100% oxygen Induction Type: IV induction Ventilation: Mask ventilation without difficulty Laryngoscope Size: Miller and 2 Grade View: Grade I Tube type: Oral Tube size: 7.0 mm Number of attempts: 1 Airway Equipment and Method: Stylet and Oral airway Placement Confirmation: ETT inserted through vocal cords under direct vision, positive ETCO2 and breath sounds checked- equal and bilateral Secured at: 22 cm Tube secured with: Tape Dental Injury: Teeth and Oropharynx as per pre-operative assessment

## 2023-12-03 NOTE — Transfer of Care (Signed)
 Immediate Anesthesia Transfer of Care Note  Patient: Henry Duncan  Procedure(s) Performed: REMOVAL, STERNAL WIRE  Patient Location: PACU  Anesthesia Type:General  Level of Consciousness: awake, alert , and oriented  Airway & Oxygen Therapy: Patient Spontanous Breathing  Post-op Assessment: Report given to RN and Post -op Vital signs reviewed and stable  Post vital signs: Reviewed and stable  Last Vitals:  Vitals Value Taken Time  BP 92/65 12/03/23 08:57  Temp    Pulse 87 12/03/23 08:57  Resp 22 12/03/23 08:57  SpO2 95 % 12/03/23 08:57  Vitals shown include unfiled device data.  Last Pain:  Vitals:   12/03/23 0605  TempSrc:   PainSc: 0-No pain         Complications: No notable events documented.

## 2023-12-06 ENCOUNTER — Encounter (HOSPITAL_COMMUNITY): Payer: Self-pay | Admitting: Surgery

## 2023-12-06 NOTE — Anesthesia Postprocedure Evaluation (Signed)
 Anesthesia Post Note  Patient: Henry Duncan  Procedure(s) Performed: REMOVAL, STERNAL WIRE     Patient location during evaluation: PACU Anesthesia Type: General Level of consciousness: awake and alert Pain management: pain level controlled Vital Signs Assessment: post-procedure vital signs reviewed and stable Respiratory status: spontaneous breathing, nonlabored ventilation, respiratory function stable and patient connected to nasal cannula oxygen Cardiovascular status: blood pressure returned to baseline and stable Postop Assessment: no apparent nausea or vomiting Anesthetic complications: no   No notable events documented.  Last Vitals:  Vitals:   12/03/23 0915 12/03/23 0930  BP: 90/72 100/62  Pulse: 76 83  Resp: 13 20  Temp:  36.5 C  SpO2: (!) 88% 93%    Last Pain:  Vitals:   12/03/23 0930  TempSrc:   PainSc: 3                  Soumya Colson S

## 2023-12-13 ENCOUNTER — Other Ambulatory Visit: Payer: Self-pay | Admitting: Surgery

## 2023-12-13 DIAGNOSIS — R0789 Other chest pain: Secondary | ICD-10-CM

## 2023-12-13 DIAGNOSIS — T8189XA Other complications of procedures, not elsewhere classified, initial encounter: Secondary | ICD-10-CM

## 2023-12-15 ENCOUNTER — Ambulatory Visit (HOSPITAL_COMMUNITY)
Admission: RE | Admit: 2023-12-15 | Discharge: 2023-12-15 | Disposition: A | Discharge status: Home / Self Care | Payer: PRIVATE HEALTH INSURANCE | Source: Ambulatory Visit | Attending: Internal Medicine | Admitting: Internal Medicine

## 2023-12-15 ENCOUNTER — Ambulatory Visit: Payer: PRIVATE HEALTH INSURANCE

## 2023-12-15 VITALS — BP 106/70 | HR 72 | Resp 20 | Ht 67.0 in | Wt 180.0 lb

## 2023-12-15 DIAGNOSIS — Z09 Encounter for follow-up examination after completed treatment for conditions other than malignant neoplasm: Secondary | ICD-10-CM

## 2023-12-15 DIAGNOSIS — T8189XA Other complications of procedures, not elsewhere classified, initial encounter: Secondary | ICD-10-CM | POA: Insufficient documentation

## 2023-12-15 NOTE — Progress Notes (Signed)
 [address] [place] [address]             [phone]       HPI: [name] is a 33 year old man with medical history of CAD, OSA, GERD, and s/p ascending aortic aneurysm repair and aortic valve replacement (2020) returns for routine postoperative follow-up having undergone removal of sternal wires on 12/03/2023 with Dr. Lucas.  The patient's postoperative recovery while in the hospital was uneventful and he was discharged same day as procedure. Since hospital discharge the patient reports that he has been doing well.  He has been taking tylenol  intermittently for pain. Overall he feels that he is having less sternal discomfort after removal of sternal wires. He reports that the incision sites are healing well.  They are scabbed over without erythema or drainage.  The sternum area is bruised but has been slowly improving over the past 2 weeks.    Current Outpatient Medications  Medication Sig Dispense Refill   acetaminophen  (TYLENOL ) 500 MG tablet Take 1,000 mg by mouth every 6 (six) hours as needed for moderate pain (pain score 4-6).     ALPRAZolam  (XANAX ) 0.5 MG tablet Take 1 tablet (0.5 mg total) by mouth at bedtime as needed for anxiety. 20 tablet 3   atorvastatin  (LIPITOR) 80 MG tablet Take 1 tablet (80 mg total) by mouth daily. 90 tablet 3   Bempedoic Acid -Ezetimibe  (NEXLIZET ) 180-10 MG TABS Take 1 tablet by mouth daily. 90 tablet 2   bimekizumab-bkzx (BIMZELX) 160 MG/ML pen Inject into the skin every 8 (eight) weeks.     busPIRone  (BUSPAR ) 5 MG tablet TAKE ONE TABLET BY MOUTH TWICE A DAY 180 tablet 3   fenofibrate  (TRICOR ) 145 MG tablet Take 1 tablet (145 mg total) by mouth daily. PLEASE CALL OFFICE TO SCHEDULE APPOINTMENT PRIOR TO NEXT REFILL 90 tablet 3   gabapentin  (NEURONTIN ) 100 MG capsule Take 1-2 capsules (100-200 mg total) by mouth at bedtime. (Patient taking differently: Take 200 mg by mouth at bedtime.) 60 capsule 3   isosorbide   mononitrate (IMDUR ) 30 MG 24 hr tablet Take 2 tablets (60 mg total) by mouth every morning AND 1 tablet (30 mg total) at bedtime. 270 tablet 1   metoprolol  succinate (TOPROL -XL) 50 MG 24 hr tablet Take 1.5 tablets (75 mg total) by mouth 2 (two) times daily. 90 tablet 3   omeprazole  (PRILOSEC) 40 MG capsule Take 1 capsule (40 mg total) by mouth daily. 90 capsule 3   oxyCODONE  (ROXICODONE ) 5 MG immediate release tablet Take 1 tablet (5 mg total) by mouth every 6 (six) hours as needed for severe pain (pain score 7-10). 28 tablet 0   traZODone  (DESYREL ) 50 MG tablet TAKE ONE-HALF TO ONE TABLET BY MOUTH AT BEDTIME AS NEEDED FOR SLEEP (Patient taking differently: Take 50 mg by mouth at bedtime.) 90 tablet 1   Vitamin D, Ergocalciferol, (DRISDOL) 1.25 MG (50000 UNIT) CAPS capsule Take 50,000 Units by mouth every Sunday.     warfarin (COUMADIN ) 5 MG tablet Take 1-2 tablets daily or as prescribed by coumadin  clinic (Patient taking differently: Take 5-7.5 mg by mouth See admin instructions. Take 5 mg on Mon and Fri Take 7.5 mg on Sun, Tues, Wed, Thurs, and Sat) 50 tablet 1   No current facility-administered medications for this visit.   Vitals:   12/15/23 1500  BP: 106/70  Pulse: 72  Resp: 20  SpO2: 96%  Review of Systems  Constitutional: Negative.  Negative for chills and fever.  Respiratory: Negative.  Negative for cough and shortness of breath.   Cardiovascular: Negative.  Negative for chest pain and leg swelling.    Physical Exam: Physical Exam Constitutional:      Appearance: Normal appearance.  HENT:     Head: Normocephalic and atraumatic.  Cardiovascular:     Rate and Rhythm: Normal rate and regular rhythm.     Heart sounds: Normal heart sounds, S1 normal and S2 normal.     Comments: Mechanical valve click Pulmonary:     Effort: Pulmonary effort is normal.     Breath sounds: Normal breath sounds.  Chest:    Skin:    General: Skin is warm and dry.  Neurological:     General:  No focal deficit present.     Mental Status: He is alert and oriented to person, place, and time.      Diagnostic Tests: CLINICAL DATA:  Sternal wire removal.   EXAM: CHEST - 2 VIEW   COMPARISON:  12/01/2023 and CT chest 11/23/2022.   FINDINGS: Trachea is midline. Heart size stable. Sternotomy wires have been removed in the interval. Aortic valve replacement. Lungs are clear. No pleural fluid.   IMPRESSION: 1. Interval sternotomy wire removal. 2. No acute findings.     Electronically Signed   By: Newell Eke M.D.   On: 12/15/2023 14:53    Plan: 1. Surgery follow-up examination/ s/p Removal of sternal wires (Primary) -Continue to keep 3 incision sites along chest clean and dry.  He can keep using tylenol  as needed for pain.  He is to continue to use caution while exercising until 6 weeks from procedure. He can slowly increase exercise as tolerated.  He has a cardiology appointment in one week and is continue follow up with them.  He can return to [facility] as needed.     Manuelita CHRISTELLA Rough, PA-C [facility] [phone]

## 2023-12-15 NOTE — Patient Instructions (Signed)
-  Keep incision sites clean and dry -Follow up as needed

## 2023-12-16 ENCOUNTER — Telehealth: Payer: Self-pay | Admitting: Cardiology

## 2023-12-16 ENCOUNTER — Encounter: Payer: Self-pay | Admitting: Surgery

## 2023-12-16 ENCOUNTER — Other Ambulatory Visit: Payer: Self-pay

## 2023-12-16 DIAGNOSIS — Z09 Encounter for follow-up examination after completed treatment for conditions other than malignant neoplasm: Secondary | ICD-10-CM

## 2023-12-16 MED ORDER — CEPHALEXIN 500 MG PO CAPS: 500.0000 mg | ORAL_CAPSULE | Freq: Two times a day (BID) | ORAL | 0 refills | Status: DC

## 2023-12-16 NOTE — Telephone Encounter (Signed)
 Called and spoke with pt. Made him aware Keflex  is safe to take with Warfarin and he can continue Warfarin as directed. Pt verbalized understanding.

## 2023-12-16 NOTE — Progress Notes (Signed)
 Surgery follow-up examination/ s/p Removal of sternal wires -Reviewed picture sent via mychart and note increased erythema around incision site that was not present on exam on 12/15/2023.  Patient reports that area is warm to the touch as well.  Sent in antibiotic at this time for coverage of skin infection.  Reviewed that patient has no medication allergies.  Continue to keep site clean and dry.  Clean with soap and water.  If area of erythema increases then patient should call back office for further evaluation.

## 2023-12-16 NOTE — Telephone Encounter (Signed)
 Pt c/o medication issue:  1. Name of Medication: cephALEXin  (KEFLEX ) 500 MG capsule   2. How are you currently taking this medication (dosage and times per day)? Take 1 capsule (500 mg total) by mouth 2 (two) times daily.  For 7 days  3. Are you having a reaction (difficulty breathing--STAT)?   4. What is your medication issue? Was calling to find out if coumadin  dosage needs to be adjusted.

## 2023-12-21 ENCOUNTER — Other Ambulatory Visit: Payer: Self-pay | Admitting: Internal Medicine

## 2023-12-21 DIAGNOSIS — F411 Generalized anxiety disorder: Secondary | ICD-10-CM

## 2023-12-22 ENCOUNTER — Ambulatory Visit: Payer: PRIVATE HEALTH INSURANCE | Attending: Cardiology

## 2023-12-22 DIAGNOSIS — Z5181 Encounter for therapeutic drug level monitoring: Secondary | ICD-10-CM | POA: Diagnosis not present

## 2023-12-22 DIAGNOSIS — Z952 Presence of prosthetic heart valve: Secondary | ICD-10-CM | POA: Diagnosis not present

## 2023-12-22 LAB — POCT INR: INR: 5.3 — AB (ref 2.0–3.0)

## 2023-12-22 NOTE — Telephone Encounter (Signed)
 Requested medication (s) are due for refill today: yes  Requested medication (s) are on the active medication list: yes  Last refill:  10/07/23  Future visit scheduled: no  Notes to clinic:  Unable to refill per protocol, cannot delegate.      Requested Prescriptions  Pending Prescriptions Disp Refills   ALPRAZolam  (XANAX ) 0.5 MG tablet [Pharmacy Med Name: ALPRAZOLAM  0.5 MG TAB] 20 tablet 3    Sig: TAKE ONE TABLET BY MOUTH AT BEDTIME AS NEEDED FOR ANXIETY     Not Delegated - Psychiatry: Anxiolytics/Hypnotics 2 Failed - 12/22/2023  9:57 AM      Failed - This refill cannot be delegated      Failed - Urine Drug Screen completed in last 360 days      Passed - Patient is not pregnant      Passed - Valid encounter within last 6 months    Recent Outpatient Visits           2 months ago Annual physical exam   [place] [facility] at [facility] Leita DEL, MD   4 months ago Left-sided chest wall pain   [place] [facility] at [facility], Leita DEL, MD   4 months ago Primary insomnia   [facility] at [facility], Leita DEL, MD

## 2023-12-22 NOTE — Patient Instructions (Signed)
 Hold today and Thursday and take only 0.5 tablet Friday then Continue warfarin 1.5 tablets daily except 1 tablet Mondays and Fridays.   Eat greens 1 of next 3 days. Recheck INR in 2 weeks  [phone]

## 2023-12-28 ENCOUNTER — Other Ambulatory Visit: Payer: Self-pay | Admitting: *Deleted

## 2023-12-28 ENCOUNTER — Encounter: Payer: Self-pay | Admitting: Internal Medicine

## 2023-12-28 DIAGNOSIS — Z5181 Encounter for therapeutic drug level monitoring: Secondary | ICD-10-CM

## 2023-12-28 DIAGNOSIS — Z7901 Long term (current) use of anticoagulants: Secondary | ICD-10-CM

## 2023-12-28 MED ORDER — WARFARIN SODIUM 5 MG PO TABS
ORAL_TABLET | ORAL | 2 refills | Status: AC
Start: 2023-12-28 — End: ?

## 2023-12-28 NOTE — Telephone Encounter (Signed)
 Warfarin 5mg  refill [employer] mechanical valve  Last INR 12/22/23 Last OV 10/11/23

## 2023-12-28 NOTE — Telephone Encounter (Signed)
 Please review.  Kp

## 2023-12-29 NOTE — Telephone Encounter (Signed)
 FYI  KP

## 2023-12-30 ENCOUNTER — Other Ambulatory Visit: Payer: Self-pay | Admitting: Internal Medicine

## 2023-12-30 DIAGNOSIS — F411 Generalized anxiety disorder: Secondary | ICD-10-CM

## 2023-12-30 MED ORDER — ALPRAZOLAM 0.5 MG PO TABS
0.5000 mg | ORAL_TABLET | Freq: Two times a day (BID) | ORAL | 0 refills | Status: DC | PRN
Start: 2023-12-30 — End: 2024-02-23

## 2023-12-30 NOTE — Progress Notes (Unsigned)
 Date:  12/30/2023   Name:  Henry Duncan   DOB:  May 11, 1991   MRN:  [id number]   Chief Complaint: No chief complaint on file.  HPI  Review of Systems   Lab Results  Component Value Date   NA 138 12/01/2023   K 4.0 12/01/2023   CO2 24 12/01/2023   GLUCOSE 112 (H) 12/01/2023   BUN 12 12/01/2023   CREATININE 0.96 12/01/2023   CALCIUM  9.0 12/01/2023   EGFR 124 10/05/2022   GFRNONAA >60 12/01/2023   Lab Results  Component Value Date   CHOL 129 08/17/2023   HDL 29 (L) 08/17/2023   LDLCALC 75 08/17/2023   TRIG 127 08/17/2023   CHOLHDL 4.4 08/17/2023   Lab Results  Component Value Date   TSH 1.517 08/17/2023   Lab Results  Component Value Date   HGBA1C 5.4 08/17/2023   Lab Results  Component Value Date   WBC 8.3 12/01/2023   HGB 14.5 12/01/2023   HCT 44.0 12/01/2023   MCV 93.6 12/01/2023   PLT 224 12/01/2023   Lab Results  Component Value Date   ALT 118 (H) 12/01/2023   AST 119 (H) 12/01/2023   ALKPHOS 36 (L) 12/01/2023   BILITOT 0.5 12/01/2023   No results found for: MARIEN BOLLS, VD25OH   Patient Active Problem List   Diagnosis Date Noted   Left-sided chest wall pain 08/16/2023   Chronic GERD 05/26/2022   OSA (obstructive sleep apnea) 04/15/2022   Plaque psoriasis 04/14/2022   Mixed hyperlipidemia 09/09/2021   Onychodystrophy 06/25/2021   Neoplasm of uncertain behavior of skin 06/25/2021   Generalized anxiety disorder 06/25/2021   Abnormal cardiac CT angiography    Coronary artery disease involving native coronary artery of native heart    Warfarin anticoagulation 03/15/2019   Encounter for therapeutic drug monitoring 09/29/2018   Aortic valve prosthesis present 09/29/2018   S/P ascending aortic aneurysm repair 09/19/2018   Allergic rhinitis 09/06/2018   Hepatic steatosis 10/30/2016   Insomnia 11/29/2015   Migraine 11/29/2015    No Known Allergies  Past Surgical History:  Procedure Laterality Date   BENTALL PROCEDURE  N/A 09/19/2018   Procedure: BENTALL PROCEDURE WITH 23 ST. JUDE GRAFT CONDUIT.;  Surgeon: Lucas Dorise POUR, MD;  Location: [facility] OR;  Service: Open Heart Surgery;  Laterality: N/A;   CARDIAC SURGERY     CLEFT LIP REPAIR     CORONARY ANGIOGRAPHY Left 06/13/2021   Procedure: CORONARY ANGIOGRAPHY (CATH LAB);  Surgeon: Darron Deatrice LABOR, MD;  Location: [facility] INVASIVE CV LAB;  Service: Cardiovascular;  Laterality: Left;   CORONARY ANGIOGRAPHY N/A 08/17/2023   Procedure: CORONARY ANGIOGRAPHY;  Surgeon: Verlin Lonni BIRCH, MD;  Location: [facility] INVASIVE CV LAB;  Service: Cardiovascular;  Laterality: N/A;   CORONARY PRESSURE/FFR WITH 3D MAPPING N/A 06/13/2021   Procedure: Coronary Pressure Wire, IFR;  Surgeon: Darron Deatrice LABOR, MD;  Location: [facility] INVASIVE CV LAB;  Service: Cardiovascular;  Laterality: N/A;   ESOPHAGOGASTRODUODENOSCOPY (EGD) WITH PROPOFOL  N/A 05/26/2022   Procedure: ESOPHAGOGASTRODUODENOSCOPY (EGD) WITH PROPOFOL ;  Surgeon: Unk Corinn Skiff, MD;  Location: [facility] ENDOSCOPY;  Service: Gastroenterology;  Laterality: N/A;   STERNAL WIRES REMOVAL N/A 12/03/2023   Procedure: REMOVAL, STERNAL WIRE;  Surgeon: Lucas Dorise POUR, MD;  Location: [facility] OR;  Service: Thoracic;  Laterality: N/A;   SUBXYPHOID PERICARDIAL WINDOW N/A 09/26/2018   Procedure: SUBXYPHOID PERICARDIAL WINDOW;  Surgeon: Lucas Dorise POUR, MD;  Location: [facility] OR;  Service: Thoracic;  Laterality: N/A;   TEE WITHOUT CARDIOVERSION  N/A 09/19/2018   Procedure: TRANSESOPHAGEAL ECHOCARDIOGRAM (TEE);  Surgeon: Lucas Dorise POUR, MD;  Location: [facility] OR;  Service: Open Heart Surgery;  Laterality: N/A;   WISDOM TOOTH EXTRACTION      Social History   Tobacco Use   Smoking status: Never   Smokeless tobacco: Never  Vaping Use   Vaping status: Former  Substance Use Topics   Alcohol use: Not Currently   Drug use: Never     Medication list has been reviewed and updated.  No outpatient medications have been marked as taking for the 12/30/23 encounter  (Orders Only) with Justus Leita DEL, MD.       10/07/2023    8:08 AM 08/18/2023    1:43 PM 08/03/2023    2:58 PM 03/02/2023    3:48 PM  GAD 7 : Generalized Anxiety Score  Nervous, Anxious, on Edge 1 3 1 2   Control/stop worrying 1 2 1 1   Worry too much - different things 1 1 1 1   Trouble relaxing 1 2 1 1   Restless 1 2 1  0  Easily annoyed or irritable 1 2 1  0  Afraid - awful might happen 0 2 1 1   Total GAD 7 Score 6 14 7 6   Anxiety Difficulty Somewhat difficult  Somewhat difficult Somewhat difficult       10/07/2023    8:08 AM 08/18/2023    1:42 PM 08/03/2023    2:56 PM  Depression screen PHQ 2/9  Decreased Interest 1 1 0  Down, Depressed, Hopeless 0 1 0  PHQ - 2 Score 1 2 0  Altered sleeping 1 0 1  Tired, decreased energy 1 1 1   Change in appetite 1 2 0  Feeling bad or failure about yourself  0 1 0  Trouble concentrating 1 1 0  Moving slowly or fidgety/restless 1 1 0  Suicidal thoughts 0 0 0  PHQ-9 Score 6 8 2   Difficult doing work/chores Somewhat difficult  Somewhat difficult    BP Readings from Last 3 Encounters:  12/15/23 106/70  12/03/23 100/62  12/01/23 92/69    Physical Exam  Wt Readings from Last 3 Encounters:  12/15/23 180 lb (81.6 kg)  12/03/23 180 lb (81.6 kg)  12/01/23 188 lb (85.3 kg)    There were no vitals taken for this visit.  Assessment and Plan:  Problem List Items Addressed This Visit   None   No follow-ups on file.    Leita HILARIO Justus, MD [facility] [place]

## 2024-01-05 ENCOUNTER — Ambulatory Visit: Payer: PRIVATE HEALTH INSURANCE | Attending: Cardiology

## 2024-01-05 DIAGNOSIS — Z952 Presence of prosthetic heart valve: Secondary | ICD-10-CM | POA: Diagnosis not present

## 2024-01-05 DIAGNOSIS — Z5181 Encounter for therapeutic drug level monitoring: Secondary | ICD-10-CM | POA: Diagnosis not present

## 2024-01-05 LAB — POCT INR: INR: 3.1 — AB (ref 2.0–3.0)

## 2024-01-05 NOTE — Patient Instructions (Signed)
Continue warfarin 1.5 tablets daily except 1 tablet Mondays and Fridays. Recheck INR in 4 weeks  [phone]

## 2024-01-14 ENCOUNTER — Ambulatory Visit: Payer: PRIVATE HEALTH INSURANCE | Admitting: Internal Medicine

## 2024-01-19 ENCOUNTER — Other Ambulatory Visit: Payer: Self-pay | Admitting: Internal Medicine

## 2024-01-19 ENCOUNTER — Encounter: Payer: Self-pay | Admitting: Internal Medicine

## 2024-01-19 DIAGNOSIS — Z87898 Personal history of other specified conditions: Secondary | ICD-10-CM

## 2024-01-19 MED ORDER — ONDANSETRON 4 MG PO TBDP: 4.0000 mg | ORAL_TABLET | Freq: Three times a day (TID) | ORAL | 0 refills | Status: DC | PRN

## 2024-01-19 NOTE — Progress Notes (Unsigned)
 Date:  01/19/2024   Name:  Henry Duncan   DOB:  [birth year]   MRN:  [id number]   Chief Complaint: No chief complaint on file.  HPI  Review of Systems   Lab Results  Component Value Date   NA 138 12/01/2023   K 4.0 12/01/2023   CO2 24 12/01/2023   GLUCOSE 112 (H) 12/01/2023   BUN 12 12/01/2023   CREATININE 0.96 12/01/2023   CALCIUM  9.0 12/01/2023   EGFR 124 10/05/2022   GFRNONAA >60 12/01/2023   Lab Results  Component Value Date   CHOL 129 08/17/2023   HDL 29 (L) 08/17/2023   LDLCALC 75 08/17/2023   TRIG 127 08/17/2023   CHOLHDL 4.4 08/17/2023   Lab Results  Component Value Date   TSH 1.517 08/17/2023   Lab Results  Component Value Date   HGBA1C 5.4 08/17/2023   Lab Results  Component Value Date   WBC 8.3 12/01/2023   HGB 14.5 12/01/2023   HCT 44.0 12/01/2023   MCV 93.6 12/01/2023   PLT 224 12/01/2023   Lab Results  Component Value Date   ALT 118 (H) 12/01/2023   AST 119 (H) 12/01/2023   ALKPHOS 36 (L) 12/01/2023   BILITOT 0.5 12/01/2023   No results found for: MARIEN BOLLS, VD25OH   Patient Active Problem List   Diagnosis Date Noted   Left-sided chest wall pain 08/16/2023   Chronic GERD 05/26/2022   OSA (obstructive sleep apnea) 04/15/2022   Plaque psoriasis 04/14/2022   Mixed hyperlipidemia 09/09/2021   Onychodystrophy 06/25/2021   Neoplasm of uncertain behavior of skin 06/25/2021   Generalized anxiety disorder 06/25/2021   Abnormal cardiac CT angiography    Coronary artery disease involving native coronary artery of native heart    Warfarin anticoagulation 03/15/2019   Encounter for therapeutic drug monitoring 09/29/2018   Aortic valve prosthesis present 09/29/2018   S/P ascending aortic aneurysm repair 09/19/2018   Allergic rhinitis 09/06/2018   Hepatic steatosis 10/30/2016   Insomnia 11/29/2015   Migraine 11/29/2015    No Known Allergies  Past Surgical History:  Procedure Laterality Date   BENTALL PROCEDURE  N/A 09/19/2018   Procedure: BENTALL PROCEDURE WITH 23 ST. JUDE GRAFT CONDUIT.;  Surgeon: Lucas Dorise POUR, MD;  Location: [facility];  Service: Open Heart Surgery;  Laterality: N/A;   CARDIAC SURGERY     CLEFT LIP REPAIR     CORONARY ANGIOGRAPHY Left 06/13/2021   Procedure: CORONARY ANGIOGRAPHY (CATH LAB);  Surgeon: Darron Deatrice LABOR, MD;  Location: [facility] INVASIVE CV LAB;  Service: Cardiovascular;  Laterality: Left;   CORONARY ANGIOGRAPHY N/A 08/17/2023   Procedure: CORONARY ANGIOGRAPHY;  Surgeon: Verlin Lonni BIRCH, MD;  [facility];  Service: Cardiovascular;  Laterality: N/A;   CORONARY PRESSURE/FFR WITH 3D MAPPING N/A 06/13/2021   Procedure: Coronary Pressure Wire, IFR;  Surgeon: Darron Deatrice LABOR, MD;  Location: [facility] INVASIVE CV LAB;  Service: Cardiovascular;  Laterality: N/A;   ESOPHAGOGASTRODUODENOSCOPY (EGD) WITH PROPOFOL  N/A 05/26/2022   Procedure: ESOPHAGOGASTRODUODENOSCOPY (EGD) WITH PROPOFOL ;  Surgeon: Unk Corinn Skiff, MD;  Location: [facility] ENDOSCOPY;  Service: Gastroenterology;  Laterality: N/A;   STERNAL WIRES REMOVAL N/A 12/03/2023   Procedure: REMOVAL, STERNAL WIRE;  Surgeon: Lucas Dorise POUR, MD;  Location: [facility];  Service: Thoracic;  Laterality: N/A;   SUBXYPHOID PERICARDIAL WINDOW N/A 09/26/2018   Procedure: SUBXYPHOID PERICARDIAL WINDOW;  Surgeon: Lucas Dorise POUR, MD;  Location: [facility] OR;  Service: Thoracic;  Laterality: N/A;   TEE WITHOUT CARDIOVERSION  N/A 09/19/2018   Procedure: TRANSESOPHAGEAL ECHOCARDIOGRAM (TEE);  Surgeon: Lucas Dorise POUR, MD;  [facility];  Service: Open Heart Surgery;  Laterality: N/A;   WISDOM TOOTH EXTRACTION      Social History   Tobacco Use   Smoking status: Never   Smokeless tobacco: Never  Vaping Use   Vaping status: Former  Substance Use Topics   Alcohol use: Not Currently   Drug use: Never     Medication list has been reviewed and updated.  No outpatient medications have been marked as taking for the 01/19/24 encounter  (Orders Only) with Justus Leita DEL, MD.       10/07/2023    8:08 AM 08/18/2023    1:43 PM 08/03/2023    2:58 PM 03/02/2023    3:48 PM  GAD 7 : Generalized Anxiety Score  Nervous, Anxious, on Edge 1 3 1 2   Control/stop worrying 1 2 1 1   Worry too much - different things 1 1 1 1   Trouble relaxing 1 2 1 1   Restless 1 2 1  0  Easily annoyed or irritable 1 2 1  0  Afraid - awful might happen 0 2 1 1   Total GAD 7 Score 6 14 7 6   Anxiety Difficulty Somewhat difficult  Somewhat difficult Somewhat difficult       10/07/2023    8:08 AM 08/18/2023    1:42 PM 08/03/2023    2:56 PM  Depression screen PHQ 2/9  Decreased Interest 1 1 0  Down, Depressed, Hopeless 0 1 0  PHQ - 2 Score 1 2 0  Altered sleeping 1 0 1  Tired, decreased energy 1 1 1   Change in appetite 1 2 0  Feeling bad or failure about yourself  0 1 0  Trouble concentrating 1 1 0  Moving slowly or fidgety/restless 1 1 0  Suicidal thoughts 0 0 0  PHQ-9 Score 6 8 2   Difficult doing work/chores Somewhat difficult  Somewhat difficult    BP Readings from Last 3 Encounters:  12/15/23 106/70  12/03/23 100/62  12/01/23 92/69    Physical Exam  Wt Readings from Last 3 Encounters:  12/15/23 180 lb (81.6 kg)  12/03/23 180 lb (81.6 kg)  12/01/23 188 lb (85.3 kg)    There were no vitals taken for this visit.  Assessment and Plan:  Problem List Items Addressed This Visit   None   No follow-ups on file.    Leita HILARIO Justus, MD [facility] [place]

## 2024-01-19 NOTE — Telephone Encounter (Signed)
 Please review and send in Zofran  if appropriate.  JM

## 2024-02-02 ENCOUNTER — Ambulatory Visit: Payer: PRIVATE HEALTH INSURANCE | Attending: Cardiology

## 2024-02-02 DIAGNOSIS — Z5181 Encounter for therapeutic drug level monitoring: Secondary | ICD-10-CM

## 2024-02-02 DIAGNOSIS — Z952 Presence of prosthetic heart valve: Secondary | ICD-10-CM | POA: Diagnosis not present

## 2024-02-02 LAB — POCT INR: INR: 2.5 (ref 2.0–3.0)

## 2024-02-02 NOTE — Patient Instructions (Signed)
Continue warfarin 1.5 tablets daily except 1 tablet Mondays and Fridays. Recheck INR in 6 weeks  [phone]

## 2024-02-08 ENCOUNTER — Other Ambulatory Visit: Payer: Self-pay | Admitting: Cardiology

## 2024-02-21 ENCOUNTER — Other Ambulatory Visit: Payer: Self-pay | Admitting: Internal Medicine

## 2024-02-21 DIAGNOSIS — F411 Generalized anxiety disorder: Secondary | ICD-10-CM

## 2024-02-22 NOTE — Telephone Encounter (Signed)
 Requested medications are due for refill today.  yes  Requested medications are on the active medications list.  yes  Last refill. 12/30/2023 #30 0 rf  Future visit scheduled.   no  Notes to clinic.  Refill not delegated.    Requested Prescriptions  Pending Prescriptions Disp Refills   ALPRAZolam  (XANAX ) 0.5 MG tablet [Pharmacy Med Name: ALPRAZOLAM  0.5 MG TAB] 30 tablet 0    Sig: TAKE ONE TABLET BY MOUTH TWICE A DAY AS NEEDED FOR ANXIETY     Not Delegated - Psychiatry: Anxiolytics/Hypnotics 2 Failed - 02/22/2024  4:57 PM      Failed - This refill cannot be delegated      Failed - Urine Drug Screen completed in last 360 days      Passed - Patient is not pregnant      Passed - Valid encounter within last 6 months    Recent Outpatient Visits           4 months ago Annual physical exam   [place] [facility] at [facility], Leita DEL, MD   6 months ago Left-sided chest wall pain   [place] [facility] at [facility], Leita DEL, MD   6 months ago Primary insomnia   [facility] at [facility], Leita DEL, MD

## 2024-02-23 ENCOUNTER — Other Ambulatory Visit: Payer: Self-pay | Admitting: Internal Medicine

## 2024-02-23 DIAGNOSIS — F411 Generalized anxiety disorder: Secondary | ICD-10-CM

## 2024-02-23 MED ORDER — ALPRAZOLAM 0.5 MG PO TABS: 0.5000 mg | ORAL_TABLET | Freq: Two times a day (BID) | ORAL | 3 refills | Status: DC | PRN

## 2024-02-23 NOTE — Progress Notes (Unsigned)
 Date:  02/23/2024   Name:  Henry Duncan   DOB:  07/25/1990   MRN:  [id number]   Chief Complaint: No chief complaint on file.  HPI  Review of Systems   Lab Results  Component Value Date   NA 138 12/01/2023   K 4.0 12/01/2023   CO2 24 12/01/2023   GLUCOSE 112 (H) 12/01/2023   BUN 12 12/01/2023   CREATININE 0.96 12/01/2023   CALCIUM  9.0 12/01/2023   EGFR 124 10/05/2022   GFRNONAA >60 12/01/2023   Lab Results  Component Value Date   CHOL 129 08/17/2023   HDL 29 (L) 08/17/2023   LDLCALC 75 08/17/2023   TRIG 127 08/17/2023   CHOLHDL 4.4 08/17/2023   Lab Results  Component Value Date   TSH 1.517 08/17/2023   Lab Results  Component Value Date   HGBA1C 5.4 08/17/2023   Lab Results  Component Value Date   WBC 8.3 12/01/2023   HGB 14.5 12/01/2023   HCT 44.0 12/01/2023   MCV 93.6 12/01/2023   PLT 224 12/01/2023   Lab Results  Component Value Date   ALT 118 (H) 12/01/2023   AST 119 (H) 12/01/2023   ALKPHOS 36 (L) 12/01/2023   BILITOT 0.5 12/01/2023   No results found for: MARIEN BOLLS, VD25OH   Patient Active Problem List   Diagnosis Date Noted   Left-sided chest wall pain 08/16/2023   Chronic GERD 05/26/2022   OSA (obstructive sleep apnea) 04/15/2022   Plaque psoriasis 04/14/2022   Mixed hyperlipidemia 09/09/2021   Onychodystrophy 06/25/2021   Neoplasm of uncertain behavior of skin 06/25/2021   Generalized anxiety disorder 06/25/2021   Abnormal cardiac CT angiography    Coronary artery disease involving native coronary artery of native heart    Warfarin anticoagulation 03/15/2019   Encounter for therapeutic drug monitoring 09/29/2018   Aortic valve prosthesis present 09/29/2018   S/P ascending aortic aneurysm repair 09/19/2018   Allergic rhinitis 09/06/2018   Hepatic steatosis 10/30/2016   Insomnia 11/29/2015   Migraine 11/29/2015    No Known Allergies  Past Surgical History:  Procedure Laterality Date   BENTALL PROCEDURE  N/A 09/19/2018   Procedure: BENTALL PROCEDURE WITH 23 ST. JUDE GRAFT CONDUIT.;  Surgeon: Lucas Dorise POUR, MD;  Location: [facility] OR;  Service: Open Heart Surgery;  Laterality: N/A;   CARDIAC SURGERY     CLEFT LIP REPAIR     CORONARY ANGIOGRAPHY Left 06/13/2021   Procedure: CORONARY ANGIOGRAPHY (CATH LAB);  Surgeon: Darron Deatrice LABOR, MD;  Location: [facility] INVASIVE CV LAB;  Service: Cardiovascular;  Laterality: Left;   CORONARY ANGIOGRAPHY N/A 08/17/2023   Procedure: CORONARY ANGIOGRAPHY;  Surgeon: Verlin Lonni BIRCH, MD;  Location: [facility] INVASIVE CV LAB;  Service: Cardiovascular;  Laterality: N/A;   CORONARY PRESSURE/FFR WITH 3D MAPPING N/A 06/13/2021   Procedure: Coronary Pressure Wire, IFR;  Surgeon: Darron Deatrice LABOR, MD;  Location: [facility] INVASIVE CV LAB;  Service: Cardiovascular;  Laterality: N/A;   ESOPHAGOGASTRODUODENOSCOPY (EGD) WITH PROPOFOL  N/A 05/26/2022   Procedure: ESOPHAGOGASTRODUODENOSCOPY (EGD) WITH PROPOFOL ;  Surgeon: Unk Corinn Skiff, MD;  Location: [facility] ENDOSCOPY;  Service: Gastroenterology;  Laterality: N/A;   STERNAL WIRES REMOVAL N/A 12/03/2023   Procedure: REMOVAL, STERNAL WIRE;  Surgeon: Lucas Dorise POUR, MD;  Location: [facility] OR;  Service: Thoracic;  Laterality: N/A;   SUBXYPHOID PERICARDIAL WINDOW N/A 09/26/2018   Procedure: SUBXYPHOID PERICARDIAL WINDOW;  Surgeon: Lucas Dorise POUR, MD;  Location: [facility] OR;  Service: Thoracic;  Laterality: N/A;   TEE WITHOUT CARDIOVERSION  N/A 09/19/2018   Procedure: TRANSESOPHAGEAL ECHOCARDIOGRAM (TEE);  Surgeon: Lucas Dorise POUR, MD;  Location: [facility] OR;  Service: Open Heart Surgery;  Laterality: N/A;   WISDOM TOOTH EXTRACTION      Social History   Tobacco Use   Smoking status: Never   Smokeless tobacco: Never  Vaping Use   Vaping status: Former  Substance Use Topics   Alcohol use: Not Currently   Drug use: Never     Medication list has been reviewed and updated.  No outpatient medications have been marked as taking for the 02/23/24 encounter  (Orders Only) with Justus Leita DEL, MD.       10/07/2023    8:08 AM 08/18/2023    1:43 PM 08/03/2023    2:58 PM 03/02/2023    3:48 PM  GAD 7 : Generalized Anxiety Score  Nervous, Anxious, on Edge 1 3 1 2   Control/stop worrying 1 2 1 1   Worry too much - different things 1 1 1 1   Trouble relaxing 1 2 1 1   Restless 1 2 1  0  Easily annoyed or irritable 1 2 1  0  Afraid - awful might happen 0 2 1 1   Total GAD 7 Score 6 14 7 6   Anxiety Difficulty Somewhat difficult  Somewhat difficult Somewhat difficult       10/07/2023    8:08 AM 08/18/2023    1:42 PM 08/03/2023    2:56 PM  Depression screen PHQ 2/9  Decreased Interest 1 1 0  Down, Depressed, Hopeless 0 1 0  PHQ - 2 Score 1 2 0  Altered sleeping 1 0 1  Tired, decreased energy 1 1 1   Change in appetite 1 2 0  Feeling bad or failure about yourself  0 1 0  Trouble concentrating 1 1 0  Moving slowly or fidgety/restless 1 1 0  Suicidal thoughts 0 0 0  PHQ-9 Score 6 8 2   Difficult doing work/chores Somewhat difficult  Somewhat difficult    BP Readings from Last 3 Encounters:  12/15/23 106/70  12/03/23 100/62  12/01/23 92/69    Physical Exam  Wt Readings from Last 3 Encounters:  12/15/23 180 lb (81.6 kg)  12/03/23 180 lb (81.6 kg)  12/01/23 188 lb (85.3 kg)    There were no vitals taken for this visit.  Assessment and Plan:  Problem List Items Addressed This Visit   None   No follow-ups on file.    Leita HILARIO Justus, MD [facility] [place]

## 2024-02-23 NOTE — Telephone Encounter (Signed)
 Please review.  KP

## 2024-03-05 ENCOUNTER — Other Ambulatory Visit: Payer: Self-pay | Admitting: Physician Assistant

## 2024-03-15 ENCOUNTER — Ambulatory Visit: Payer: PRIVATE HEALTH INSURANCE | Attending: Cardiology

## 2024-03-15 DIAGNOSIS — Z5181 Encounter for therapeutic drug level monitoring: Secondary | ICD-10-CM

## 2024-03-15 DIAGNOSIS — Z952 Presence of prosthetic heart valve: Secondary | ICD-10-CM

## 2024-03-15 LAB — POCT INR: INR: 4.4 — AB (ref 2.0–3.0)

## 2024-03-15 NOTE — Patient Instructions (Signed)
 Hold tonight only and eat another helping of greens then Continue warfarin 1.5 tablets daily except 1 tablet Mondays and Fridays.   Recheck INR in 4 weeks  [phone]

## 2024-03-20 ENCOUNTER — Ambulatory Visit (INDEPENDENT_AMBULATORY_CARE_PROVIDER_SITE_OTHER): Payer: PRIVATE HEALTH INSURANCE | Admitting: Podiatry

## 2024-03-20 ENCOUNTER — Ambulatory Visit (INDEPENDENT_AMBULATORY_CARE_PROVIDER_SITE_OTHER): Payer: PRIVATE HEALTH INSURANCE

## 2024-03-20 VITALS — Ht 67.0 in | Wt 180.0 lb

## 2024-03-20 DIAGNOSIS — M7751 Other enthesopathy of right foot: Secondary | ICD-10-CM

## 2024-03-20 DIAGNOSIS — M205X1 Other deformities of toe(s) (acquired), right foot: Secondary | ICD-10-CM

## 2024-03-20 NOTE — Patient Instructions (Signed)
 Look for Voltaren gel at the pharmacy over the counter or online (also known as diclofenac 1% gel). Apply to the painful areas 3-4x daily with the supplied dosing card. Allow to dry for 10 minutes before going into socks/shoes   The surgery we discussed is shaving the bone spur down. It would need a week in a post op surgical shoe and then you can go back to a normal shoe. It may be a few weeks before the swelling to go down to fit into your steel toes. You may have arthritis still down the road to deal with. Let me know if you are interested in scheduling this

## 2024-03-22 ENCOUNTER — Encounter: Payer: Self-pay | Admitting: Podiatry

## 2024-03-22 NOTE — Progress Notes (Signed)
  Subjective:  Patient ID: Henry Duncan, male    DOB: Nov 18, 1990,  MRN: [id number]  Chief Complaint  Patient presents with   Foot Pain    Rm 6 Patient is here for big toe right joint pain. Patient states no injury to right foot. Pain has been present for 1 month.    33 y.o. male presents with the above complaint. History confirmed with patient.   Objective:  Physical Exam: warm, good capillary refill, no trophic changes or ulcerative lesions, normal DP and PT pulses, normal sensory exam, and painful dorsal spurring right first MTPJ.   Radiographs: Multiple views x-ray of the left foot: Minimal joint space narrowing of first MTP joint with large dorsal osteophyte Assessment:   1. Hallux limitus, right      Plan:  Patient was evaluated and treated and all questions answered.  Discussed treatment of hallux limitus in detail we discussed surgical nonsurgical treatment options.  Discussed injection therapy could offer some relief but likely would not permanently alleviate this.  He has a moderate amount of cartilage remaining on his radiographs noted today, discussed cheilectomy can offer some relief of this likely at some point in his lifetime will need arthrodesis of the joint which will take longer to recover from.  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  He is going to discuss with his wife and let me know if he would like to proceed with this.  No follow-ups on file.

## 2024-03-26 ENCOUNTER — Other Ambulatory Visit: Payer: Self-pay | Admitting: Cardiology

## 2024-03-26 DIAGNOSIS — I2510 Atherosclerotic heart disease of native coronary artery without angina pectoris: Secondary | ICD-10-CM

## 2024-04-07 ENCOUNTER — Ambulatory Visit
Admission: RE | Admit: 2024-04-07 | Discharge: 2024-04-07 | Disposition: A | Discharge status: Home / Self Care | Payer: PRIVATE HEALTH INSURANCE | Attending: Emergency Medicine | Admitting: Emergency Medicine

## 2024-04-07 VITALS — BP 108/72 | HR 67 | Temp 97.8°F | Resp 18

## 2024-04-07 DIAGNOSIS — J0100 Acute maxillary sinusitis, unspecified: Secondary | ICD-10-CM

## 2024-04-07 MED ORDER — AMOXICILLIN-POT CLAVULANATE 875-125 MG PO TABS
1.0000 | ORAL_TABLET | Freq: Two times a day (BID) | ORAL | 0 refills | Status: AC
Start: 2024-04-07 — End: ?

## 2024-04-07 NOTE — ED Triage Notes (Signed)
 Patient to Urgent Care with complaints of productive cough/ nasal congestion/ drainage.  Symptoms x9 days.  Taking mucinex / dayquil (some relief).

## 2024-04-07 NOTE — Discharge Instructions (Addendum)
 Take the Augmentin as directed.  Follow up with your primary care provider if your symptoms are not improving.

## 2024-04-07 NOTE — ED Provider Notes (Signed)
 Henry Duncan    CSN: [id number] Arrival date & time: 04/07/24  1448      History   Chief Complaint Chief Complaint  Patient presents with   Cough    Cough and conjestjon lasting 9 days - Entered by patient    HPI CHARLE Duncan is a 33 y.o. male.  Patient presents with 9-day history of congestion, sinus pressure, postnasal drip, cough.  No fever or shortness of breath.  He has been treating his symptoms with DayQuil and Mucinex .  The history is provided by the patient and medical records.    Past Medical History:  Diagnosis Date   Allergy    Anxiety    Aortic aneurysm 09/16/2018   a. 08/2018 Asc Ao/root aneuryms w/ dissection-->s/p Bentall w/ 26mm Hemashield graft, 23mm SJM AVR, and reimplantation of cor arteries; b. 04/2021 CTA Chest: stable post-op changes.   CAD (coronary artery disease)    a. 08/2018 Cor CTA: Ca2+ 99th%'ile (476). Calcified plaque in mLAD. Nl LM, LCX, RCA. S/p reimplantation of coronary arteries in setting of Bentall; b. 05/2021 Cor CTA: LAD >65m (nl FFRct); c. 05/2021 Cath: LM nl, LAD 40p, 41m, LCX/OM1 min irregs, RCA nl->Med rx.   H/O mechanical aortic valve replacement    a. 08/2018 in setting of bicuspid AoV and Asc Ao dissection-->58mm SJM mech AVR; b. 03/2020 Echo: EF 55-60%. No rwma. Mild LVH. Nl RV fxn. Nl functioning AVR.   Hyperlipidemia    Hypertension    Pericardial effusion    a. 08/2018 post-op -->s/p window.   Plaque psoriasis     Patient Active Problem List   Diagnosis Date Noted   Left-sided chest wall pain 08/16/2023   Chronic GERD 05/26/2022   OSA (obstructive sleep apnea) 04/15/2022   Plaque psoriasis 04/14/2022   Mixed hyperlipidemia 09/09/2021   Onychodystrophy 06/25/2021   Neoplasm of uncertain behavior of skin 06/25/2021   Generalized anxiety disorder 06/25/2021   Abnormal cardiac CT angiography    Coronary artery disease involving native coronary artery of native heart    Warfarin anticoagulation 03/15/2019    Encounter for therapeutic drug monitoring 09/29/2018   Aortic valve prosthesis present 09/29/2018   S/P ascending aortic aneurysm repair 09/19/2018   Allergic rhinitis 09/06/2018   Hepatic steatosis 10/30/2016   Insomnia 11/29/2015   Migraine 11/29/2015    Past Surgical History:  Procedure Laterality Date   BENTALL PROCEDURE N/A 09/19/2018   Procedure: BENTALL PROCEDURE WITH 23 ST. JUDE GRAFT CONDUIT.;  Surgeon: Lucas Dorise POUR, MD;  Location: [facility] OR;  Service: Open Heart Surgery;  Laterality: N/A;   CARDIAC SURGERY     CLEFT LIP REPAIR     CORONARY ANGIOGRAPHY Left 06/13/2021   Procedure: CORONARY ANGIOGRAPHY (CATH LAB);  Surgeon: Darron Deatrice LABOR, MD;  Location: [facility] INVASIVE CV LAB;  Service: Cardiovascular;  Laterality: Left;   CORONARY ANGIOGRAPHY N/A 08/17/2023   Procedure: CORONARY ANGIOGRAPHY;  Surgeon: Verlin Lonni BIRCH, MD;  Location: [facility];  Service: Cardiovascular;  Laterality: N/A;   CORONARY PRESSURE/FFR WITH 3D MAPPING N/A 06/13/2021   Procedure: Coronary Pressure Wire, IFR;  Surgeon: Darron Deatrice LABOR, MD;  Location: [facility] INVASIVE CV LAB;  Service: Cardiovascular;  Laterality: N/A;   ESOPHAGOGASTRODUODENOSCOPY (EGD) WITH PROPOFOL  N/A 05/26/2022   Procedure: ESOPHAGOGASTRODUODENOSCOPY (EGD) WITH PROPOFOL ;  Surgeon: Unk Corinn Skiff, MD;  Location: [facility] ENDOSCOPY;  Service: Gastroenterology;  Laterality: N/A;   STERNAL WIRES REMOVAL N/A 12/03/2023   Procedure: REMOVAL, STERNAL WIRE;  Surgeon: Lucas Dorise POUR, MD;  Location: [facility] OR;  Service: Thoracic;  Laterality: N/A;   SUBXYPHOID PERICARDIAL WINDOW N/A 09/26/2018   Procedure: SUBXYPHOID PERICARDIAL WINDOW;  Surgeon: Lucas Dorise POUR, MD;  Location: [facility] OR;  Service: Thoracic;  Laterality: N/A;   TEE WITHOUT CARDIOVERSION N/A 09/19/2018   Procedure: TRANSESOPHAGEAL ECHOCARDIOGRAM (TEE);  Surgeon: Lucas Dorise POUR, MD;  Location: [facility] OR;  Service: Open Heart Surgery;  Laterality: N/A;   WISDOM TOOTH  EXTRACTION         Home Medications    Prior to Admission medications   Medication Sig Start Date End Date Taking? Authorizing Provider  amoxicillin -clavulanate (AUGMENTIN ) 875-125 MG tablet Take 1 tablet by mouth every 12 (twelve) hours. 04/07/24  Yes Corlis Burnard DEL, NP  acetaminophen  (TYLENOL ) 500 MG tablet Take 1,000 mg by mouth every 6 (six) hours as needed for moderate pain (pain score 4-6).    [provider]  ALPRAZolam  (XANAX ) 0.5 MG tablet Take 1 tablet (0.5 mg total) by mouth 2 (two) times daily as needed for anxiety. 02/23/24   Justus Leita DEL, MD  atorvastatin  (LIPITOR) 80 MG tablet Take 1 tablet (80 mg total) by mouth daily. 10/20/23 10/14/24  Darliss Rogue, MD  Bempedoic Acid -Ezetimibe  (NEXLIZET ) 180-10 MG TABS Take 1 tablet by mouth daily. 11/11/23   Darliss Rogue, MD  bimekizumab-bkzx [facility]) 160 MG/ML pen Inject into the skin every 8 (eight) weeks.    [provider]  busPIRone  (BUSPAR ) 5 MG tablet TAKE ONE TABLET BY MOUTH TWICE A DAY 11/12/23   Justus Leita DEL, MD  fenofibrate  (TRICOR ) 145 MG tablet Take 1 tablet (145 mg total) by mouth daily. PLEASE CALL OFFICE TO SCHEDULE APPOINTMENT PRIOR TO NEXT REFILL 08/27/23   Darliss Rogue, MD  gabapentin  (NEURONTIN ) 100 MG capsule Take 1-2 capsules (100-200 mg total) by mouth at bedtime. Patient taking differently: Take 200 mg by mouth at bedtime. 10/07/23   Justus Leita DEL, MD  isosorbide  mononitrate (IMDUR ) 30 MG 24 hr tablet TAKE TWO TABLETS BY MOUTH EVERY MORNING AND TAKE ONE TABLET BY MOUTH AT BEDTIME 03/28/24   Darliss Rogue, MD  metoprolol  succinate (TOPROL -XL) 50 MG 24 hr tablet TAKE ONE AND ONE-HALF TABLETS BY MOUTH TWICE A DAY 02/09/24   Darliss Rogue, MD  omeprazole  (PRILOSEC) 40 MG capsule TAKE ONE CAPSULE BY MOUTH ONE TIME DAILY 03/06/24   [place], PA-C  ondansetron  (ZOFRAN -ODT) 4 MG disintegrating tablet Take 1 tablet (4 mg total) by mouth every 8 (eight) hours as needed for  nausea or vomiting. 01/19/24   Justus Leita DEL, MD  oxyCODONE  (ROXICODONE ) 5 MG immediate release tablet Take 1 tablet (5 mg total) by mouth every 6 (six) hours as needed for severe pain (pain score 7-10). 12/03/23   Raguel Con RAMAN, PA-C  traZODone  (DESYREL ) 50 MG tablet TAKE ONE-HALF TO ONE TABLET BY MOUTH AT BEDTIME AS NEEDED FOR SLEEP Patient taking differently: Take 50 mg by mouth at bedtime. 11/15/23   Berglund, Laura H, MD  Vitamin D, Ergocalciferol, (DRISDOL) 1.25 MG (50000 UNIT) CAPS capsule Take 50,000 Units by mouth every Sunday. 01/28/23   [provider]  warfarin (COUMADIN ) 5 MG tablet Take 1 tablet to 1 and 1/2 tablets by mouth daily or as prescribed by Anticoagulation Clinic. 12/28/23   Darliss Rogue, MD    Family History Family History  Problem Relation Age of Onset   Heart attack Mother    Anxiety disorder Father    Diabetes Father    Depression Brother    Anxiety disorder Brother  Social History Social History   Tobacco Use   Smoking status: Never   Smokeless tobacco: Never  Vaping Use   Vaping status: Former  Substance Use Topics   Alcohol use: Not Currently   Drug use: Never     Allergies   Patient has no known allergies.   Review of Systems Review of Systems  Constitutional:  Negative for chills and fever.  HENT:  Positive for congestion, postnasal drip and sinus pressure. Negative for ear pain and sore throat.   Respiratory:  Positive for cough. Negative for shortness of breath.      Physical Exam Triage Vital Signs ED Triage Vitals  Encounter Vitals Group     BP 04/07/24 1509 108/72     Girls Systolic BP Percentile --      Girls Diastolic BP Percentile --      Boys Systolic BP Percentile --      Boys Diastolic BP Percentile --      Pulse Rate 04/07/24 1509 67     Resp 04/07/24 1509 18     Temp 04/07/24 1509 97.8 F (36.6 C)     Temp src --      SpO2 04/07/24 1509 98 %     Weight --      Height --      Head  Circumference --      Peak Flow --      Pain Score 04/07/24 1508 4     Pain Loc --      Pain Education --      Exclude from Growth Chart --    No data found.  Updated Vital Signs BP 108/72   Pulse 67   Temp 97.8 F (36.6 C)   Resp 18   SpO2 98%   Visual Acuity Right Eye Distance:   Left Eye Distance:   Bilateral Distance:    Right Eye Near:   Left Eye Near:    Bilateral Near:     Physical Exam Constitutional:      General: He is not in acute distress. HENT:     Right Ear: Tympanic membrane normal.     Left Ear: Tympanic membrane normal.     Nose: Congestion present.     Mouth/Throat:     Mouth: Mucous membranes are moist.     Pharynx: Oropharynx is Duncan.  Cardiovascular:     Rate and Rhythm: Normal rate and regular rhythm.     Heart sounds: Normal heart sounds.  Pulmonary:     Effort: Pulmonary effort is normal. No respiratory distress.     Breath sounds: Normal breath sounds.  Neurological:     Mental Status: He is alert.      UC Treatments / Results  Labs (all labs ordered are listed, but only abnormal results are displayed) Labs Reviewed - No data to display  EKG   Radiology No results found.  Procedures Procedures (including critical care time)  Medications Ordered in UC Medications - No data to display  Initial Impression / Assessment and Plan / UC Course  I have reviewed the triage vital signs and the nursing notes.  Pertinent labs & imaging results that were available during my care of the patient were reviewed by me and considered in my medical decision making (see chart for details).    Acute sinusitis.  Afebrile and vital signs are stable.  Lungs are Duncan and O2 sat is 98% on room air.  Patient has been symptomatic for 9 days.  Treating today with Augmentin .  Education provided on sinus infection.  Instructed patient to follow-up with his PCP if he is not improving.  He agrees to plan of care.  Final Clinical Impressions(s) / UC  Diagnoses   Final diagnoses:  Acute non-recurrent maxillary sinusitis     Discharge Instructions      Take the Augmentin  as directed.  Follow-up with your primary care provider if your symptoms are not improving.      ED Prescriptions     Medication Sig Dispense Auth. Provider   amoxicillin -clavulanate (AUGMENTIN ) 875-125 MG tablet Take 1 tablet by mouth every 12 (twelve) hours. 14 tablet Corlis Burnard DEL, NP      PDMP not reviewed this encounter.   Corlis Burnard DEL, NP 04/07/24 [phone]

## 2024-04-12 ENCOUNTER — Ambulatory Visit: Payer: PRIVATE HEALTH INSURANCE | Attending: Cardiology

## 2024-04-12 DIAGNOSIS — Z5181 Encounter for therapeutic drug level monitoring: Secondary | ICD-10-CM

## 2024-04-12 DIAGNOSIS — Z952 Presence of prosthetic heart valve: Secondary | ICD-10-CM | POA: Diagnosis not present

## 2024-04-12 LAB — POCT INR: INR: 2.3 (ref 2.0–3.0)

## 2024-04-12 NOTE — Patient Instructions (Signed)
 Continue warfarin 1.5 tablets daily except 1 tablet Mondays and Fridays.  Do not eat greens until the weekend. Recheck INR in 4 weeks  [phone]

## 2024-04-24 ENCOUNTER — Telehealth: Payer: Self-pay | Admitting: Podiatry

## 2024-04-24 NOTE — Telephone Encounter (Signed)
 Patient scheduled for 05/26/2024 per his request. Patient pharmacy is correct in chart. Patient is on blood thinners and has asked for us  to send out for cardiac clearance. Patient not on any GLP1 medications. Pharmacy correct in chart.

## 2024-04-26 ENCOUNTER — Ambulatory Visit: Payer: PRIVATE HEALTH INSURANCE | Attending: Cardiology | Admitting: Cardiology

## 2024-04-26 ENCOUNTER — Encounter: Payer: Self-pay | Admitting: Cardiology

## 2024-04-26 VITALS — BP 110/58 | HR 75 | Ht 67.0 in | Wt 205.4 lb

## 2024-04-26 DIAGNOSIS — Z9889 Other specified postprocedural states: Secondary | ICD-10-CM | POA: Diagnosis not present

## 2024-04-26 DIAGNOSIS — Z8679 Personal history of other diseases of the circulatory system: Secondary | ICD-10-CM

## 2024-04-26 DIAGNOSIS — I2510 Atherosclerotic heart disease of native coronary artery without angina pectoris: Secondary | ICD-10-CM | POA: Diagnosis not present

## 2024-04-26 DIAGNOSIS — Z7901 Long term (current) use of anticoagulants: Secondary | ICD-10-CM

## 2024-04-26 DIAGNOSIS — E785 Hyperlipidemia, unspecified: Secondary | ICD-10-CM | POA: Diagnosis not present

## 2024-04-26 DIAGNOSIS — Z952 Presence of prosthetic heart valve: Secondary | ICD-10-CM

## 2024-04-26 DIAGNOSIS — Z5181 Encounter for therapeutic drug level monitoring: Secondary | ICD-10-CM

## 2024-04-26 NOTE — Progress Notes (Signed)
 Cardiology Office Note:    Date:  04/26/2024   ID:  Henry Duncan, DOB 1990-11-27, MRN [id number]  PCP:  Henry Leita DEL, MD  [facility] Cardiologist:  Henry Cave, MD  [facility] Electrophysiologist:  None   Referring MD: Henry Leita DEL, MD   Chief Complaint  Patient presents with   Follow-up    6 month follow up pt has been doing well with no complaints of chest pain, chest pressure or SOB, medciation reviewed verbally with patient    History of Present Illness:    Henry Duncan is a 33 y.o. male with a hx of hypertension, CAD (lhc 05/2021-50% mid LAD), aortic aneurysm, bicuspid aortic valve, ascending thoracic aorta dissection s/p Bentall procedure (with 23 mm St. Jude's mechanical valve, developed pericardial effusion postoperatively and underwent pericardial window 08/2018), mixed hyperlipidemia who presents for follow-up.  Doing okay, had an episode of chest pain couple of weeks ago lasting a few minutes, took Xanax  for anxiety with resolution of symptoms.  Has not had any episodes since.  Compliant medications as prescribed.  Planning on having a foot procedure due to a bone spur.  Will require holding Coumadin .  Otherwise has no cardiac concerns at this time.   Prior notes LHC 05/2021 mid LAD 50%.  IFFR 0.95   CCTA 05/2021 severe mid LAD stenosis, calcium  score 958.  FFRct did not show significant stenosis TTE 03/2020 EF 55 to 60%, normal functioning prosthetic valve.  Patient presented to the hospital on 09/13/2018 due to chest pain.  CT did reveal ascending thoracic aorta aneurysm measuring 5.7 to 5.8 cm, localized partial dissection of the anterior middle ascending thoracic aorta.  He was subsequently taken for surgery and underwent Bentall procedure.  Last transthoracic echocardiogram 09/16/2018 showed normal systolic and diastolic function, EF 60 to 65%.  Intraoperative TEE on 09/19/2018 showed normal bioprosthetic valve with normal washing  jets, transaortic gradient 7 mmHg.  Coronary CTA 2020, calcium  score 476, moderate mid LAD disease   Past Medical History:  Diagnosis Date   Allergy    Anxiety    Aortic aneurysm 09/16/2018   a. 08/2018 Asc Ao/root aneuryms w/ dissection-->s/p Bentall w/ 26mm Hemashield graft, 23mm SJM AVR, and reimplantation of cor arteries; b. 04/2021 CTA Chest: stable post-op changes.   CAD (coronary artery disease)    a. 08/2018 Cor CTA: Ca2+ 99th%'ile (476). Calcified plaque in mLAD. Nl LM, LCX, RCA. S/p reimplantation of coronary arteries in setting of Bentall; b. 05/2021 Cor CTA: LAD >69m (nl FFRct); c. 05/2021 Cath: LM nl, LAD 40p, 49m, LCX/OM1 min irregs, RCA nl->Med rx.   H/O mechanical aortic valve replacement    a. 08/2018 in setting of bicuspid AoV and Asc Ao dissection-->62mm SJM mech AVR; b. 03/2020 Echo: EF 55-60%. No rwma. Mild LVH. Nl RV fxn. Nl functioning AVR.   Hyperlipidemia    Hypertension    Pericardial effusion    a. 08/2018 post-op -->s/p window.   Plaque psoriasis     Past Surgical History:  Procedure Laterality Date   BENTALL PROCEDURE N/A 09/19/2018   Procedure: BENTALL PROCEDURE WITH 23 ST. JUDE GRAFT CONDUIT.;  Surgeon: Henry Dorise POUR, MD;  Location: [facility] OR;  Service: Open Heart Surgery;  Laterality: N/A;   CARDIAC SURGERY     CLEFT LIP REPAIR     CORONARY ANGIOGRAPHY Left 06/13/2021   Procedure: CORONARY ANGIOGRAPHY (CATH LAB);  Surgeon: Henry Deatrice LABOR, MD;  Location: [facility] INVASIVE CV LAB;  Service: Cardiovascular;  Laterality: Left;  CORONARY ANGIOGRAPHY N/A 08/17/2023   Procedure: CORONARY ANGIOGRAPHY;  Surgeon: Henry Lonni BIRCH, MD;  Location: [facility] INVASIVE CV LAB;  Service: Cardiovascular;  Laterality: N/A;   CORONARY PRESSURE/FFR WITH 3D MAPPING N/A 06/13/2021   Procedure: Coronary Pressure Wire, IFR;  Surgeon: Henry Deatrice LABOR, MD;  Location: [facility] INVASIVE CV LAB;  Service: Cardiovascular;  Laterality: N/A;   ESOPHAGOGASTRODUODENOSCOPY (EGD) WITH PROPOFOL  N/A  05/26/2022   Procedure: ESOPHAGOGASTRODUODENOSCOPY (EGD) WITH PROPOFOL ;  Surgeon: Henry Corinn Skiff, MD;  Location: [facility] ENDOSCOPY;  Service: Gastroenterology;  Laterality: N/A;   STERNAL WIRES REMOVAL N/A 12/03/2023   Procedure: REMOVAL, STERNAL WIRE;  Surgeon: Henry Dorise POUR, MD;  Location: [facility] OR;  Service: Thoracic;  Laterality: N/A;   SUBXYPHOID PERICARDIAL WINDOW N/A 09/26/2018   Procedure: SUBXYPHOID PERICARDIAL WINDOW;  Surgeon: Henry Dorise POUR, MD;  Location: [facility] OR;  Service: Thoracic;  Laterality: N/A;   TEE WITHOUT CARDIOVERSION N/A 09/19/2018   Procedure: TRANSESOPHAGEAL ECHOCARDIOGRAM (TEE);  Surgeon: Henry Dorise POUR, MD;  [facility];  Service: Open Heart Surgery;  Laterality: N/A;   WISDOM TOOTH EXTRACTION      Current Medications: Current Meds  Medication Sig   acetaminophen  (TYLENOL ) 500 MG tablet Take 1,000 mg by mouth every 6 (six) hours as needed for moderate pain (pain score 4-6).   ALPRAZolam  (XANAX ) 0.5 MG tablet Take 1 tablet (0.5 mg total) by mouth 2 (two) times daily as needed for anxiety.   amoxicillin -clavulanate (AUGMENTIN ) 875-125 MG tablet Take 1 tablet by mouth every 12 (twelve) hours.   atorvastatin  (LIPITOR) 80 MG tablet Take 1 tablet (80 mg total) by mouth daily.   Bempedoic Acid -Ezetimibe  (NEXLIZET ) 180-10 MG TABS Take 1 tablet by mouth daily.   bimekizumab-bkzx (BIMZELX) 160 MG/ML pen Inject into the skin every 8 (eight) weeks.   busPIRone  (BUSPAR ) 5 MG tablet TAKE ONE TABLET BY MOUTH TWICE A DAY   fenofibrate  (TRICOR ) 145 MG tablet Take 1 tablet (145 mg total) by mouth daily. PLEASE CALL OFFICE TO SCHEDULE APPOINTMENT PRIOR TO NEXT REFILL   gabapentin  (NEURONTIN ) 100 MG capsule Take 1-2 capsules (100-200 mg total) by mouth at bedtime. (Patient taking differently: Take 200 mg by mouth at bedtime.)   isosorbide  mononitrate (IMDUR ) 30 MG 24 hr tablet TAKE TWO TABLETS BY MOUTH EVERY MORNING AND TAKE ONE TABLET BY MOUTH AT BEDTIME   metoprolol  succinate  (TOPROL -XL) 50 MG 24 hr tablet TAKE ONE AND ONE-HALF TABLETS BY MOUTH TWICE A DAY   omeprazole  (PRILOSEC) 40 MG capsule TAKE ONE CAPSULE BY MOUTH ONE TIME DAILY   ondansetron  (ZOFRAN -ODT) 4 MG disintegrating tablet Take 1 tablet (4 mg total) by mouth every 8 (eight) hours as needed for nausea or vomiting.   traZODone  (DESYREL ) 50 MG tablet TAKE ONE-HALF TO ONE TABLET BY MOUTH AT BEDTIME AS NEEDED FOR SLEEP (Patient taking differently: Take 50 mg by mouth at bedtime.)   Vitamin D, Ergocalciferol, (DRISDOL) 1.25 MG (50000 UNIT) CAPS capsule Take 50,000 Units by mouth every Sunday.   warfarin (COUMADIN ) 5 MG tablet Take 1 tablet to 1 and 1/2 tablets by mouth daily or as prescribed by Anticoagulation Clinic.     Allergies:   Patient has no known allergies.   Social History   Socioeconomic History   Marital status: Married    Spouse name: Not on file   Number of children: 0   Years of education: Not on file   Highest education level: 12th grade  Occupational History   Not on file  Tobacco Use  Smoking status: Never   Smokeless tobacco: Never  Vaping Use   Vaping status: Former  Substance and Sexual Activity   Alcohol use: Not Currently   Drug use: Never   Sexual activity: Yes    Partners: Female    Birth control/protection: Condom  Other Topics Concern   Not on file  Social History Narrative   Not on file   Social Drivers of Health   Financial Resource Strain: Low Risk  (01/11/2024)   Overall Financial Resource Strain (CARDIA)    Difficulty of Paying Living Expenses: Not hard at all  Food Insecurity: No Food Insecurity (01/11/2024)   Hunger Vital Sign    Worried About Running Out of Food in the Last Year: Never true    Ran Out of Food in the Last Year: Never true  Transportation Needs: No Transportation Needs (01/11/2024)   PRAPARE - Administrator, Civil Service (Medical): No    Lack of Transportation (Non-Medical): No  Physical Activity: Sufficiently Active  (01/11/2024)   Exercise Vital Sign    Days of Exercise per Week: 5 days    Minutes of Exercise per Session: 30 min  Stress: Stress Concern Present (01/11/2024)   [employer] of Occupational Health - Occupational Stress Questionnaire    Feeling of Stress: To some extent  Social Connections: Socially Integrated (01/11/2024)   Social Connection and Isolation Panel    Frequency of Communication with Friends and Family: Three times a week    Frequency of Social Gatherings with Friends and Family: Twice a week    Attends Religious Services: More than 4 times per year    Active Member of [employer] or Organizations: Yes    Attends Engineer, Structural: More than 4 times per year    Marital Status: Married     Family History: The patient's family history includes Anxiety disorder in his brother and father; Depression in his brother; Diabetes in his father; Heart attack in his mother.  ROS:   Please see the history of present illness.     All other systems reviewed and are negative.  EKGs/Labs/Other Studies Reviewed:    The following studies were reviewed today:   EKG:  EKG not ordered today.   Recent Labs: 08/17/2023: TSH 1.517 12/01/2023: ALT 118; BUN 12; Creatinine, Ser 0.96; Hemoglobin 14.5; Platelets 224; Potassium 4.0; Sodium 138  Recent Lipid Panel    Component Value Date/Time   CHOL 129 08/17/2023 0752   CHOL 211 (H) 09/30/2021 1118   TRIG 127 08/17/2023 0752   HDL 29 (L) 08/17/2023 0752   HDL 46 09/30/2021 1118   CHOLHDL 4.4 08/17/2023 0752   VLDL 25 08/17/2023 0752   LDLCALC 75 08/17/2023 0752   LDLCALC 133 (H) 09/30/2021 1118     Risk Assessment/Calculations:      Physical Exam:    VS:  BP (!) 110/58 (BP Location: Left Arm, Patient Position: Sitting)   Pulse 75   Ht 5' 7 (1.702 m)   Wt 205 lb 6.4 oz (93.2 kg)   SpO2 97%   BMI 32.17 kg/m     Wt Readings from Last 3 Encounters:  04/26/24 205 lb 6.4 oz (93.2 kg)  03/20/24 180 lb (81.6 kg)   12/15/23 180 lb (81.6 kg)     GEN:  Well nourished, well developed in no acute distress HEENT: Normal NECK: No JVD; No carotid bruits CARDIAC: RRR, 2/6 systolic murmur, mechanical click consistent with mechanical valve noted. RESPIRATORY:  Clear to auscultation  without rales, wheezing or rhonchi  ABDOMEN: Soft, non-tender, non-distended MUSCULOSKELETAL:  No edema; No tenderness to palpation of chest. SKIN: Warm and dry NEUROLOGIC:  Alert and oriented x 3 PSYCHIATRIC:  Normal affect   ASSESSMENT:    1. Coronary artery disease involving native coronary artery of native heart, unspecified whether angina present   2. S/P ascending aortic aneurysm repair   3. H/O mechanical aortic valve replacement   4. Hyperlipidemia LDL goal <70   5. Encounter for monitoring Coumadin  therapy      PLAN:    In order of problems listed above:  CAD, LHC 05/2021 mid LAD 50%.  IFFR 0.95.  Denies cardiac chest pain.  Continue Imdur  90 mg daily, Lipitor 80 mg daily, Nexlizet  180-10 mg daily, warfarin 5 mg daily.  aortic root aneurysm, dissection, s/p Bentall procedure with aortic root replacement and mechanical aortic valve (08/2018).  Chest CT with stable aorta size.  History of mechanical aortic valve replacement, normal aortic valve prosthesis on echo 03/2022.  EF 60 to 65%.  Continue warfarin.  Goal INR 2-3. hyperlipidemia, cholesterol controlled.  Continue Nexlizet , Lipitor  80 mg daily, fenofibrate .  On Coumadin  for mechanical valve, planning foot procedure in about 1 month.  Will recommend bridging with Lovenox  after Coumadin  is held.  Referral/consult with our pharmacist  Follow-up in 12 months  Medication Adjustments/Labs and Tests Ordered: Current medicines are reviewed at length with the patient today.  Concerns regarding medicines are outlined above.  Orders Placed This Encounter  Procedures   AMB Referral to [facility]   No orders of the defined types were placed in this  encounter.   Patient Instructions  Medication Instructions:  Your physician recommends that you continue on your current medications as directed. Please refer to the Current Medication list given to you today.   *If you need a refill on your cardiac medications before your next appointment, please call your pharmacy*  Lab Work: No labs ordered today  If you have labs (blood work) drawn today and your tests are completely normal, you will receive your results only by: MyChart Message (if you have MyChart) OR A paper copy in the mail If you have any lab test that is abnormal or we need to change your treatment, we will call you to review the results.  Testing/Procedures: No test ordered today   Follow-Up: At [facility], you and your health needs are our priority.  As part of our continuing mission to provide you with exceptional heart care, our providers are all part of one team.  This team includes your primary Cardiologist (physician) and Advanced Practice Providers or APPs (Physician Assistants and Nurse Practitioners) who all work together to provide you with the care you need, when you need it.  Your next appointment:   1 year(s)  Provider:   You may see Henry Cave, MD or one of the following Advanced Practice Providers on your designated Care Team:   Lonni Meager, NP Lesley Maffucci, PA-C Bernardino Bring, PA-C Cadence [place], PA-C Tylene Lunch, NP Barnie Hila, NP    We recommend signing up for the patient portal called MyChart.  Sign up information is provided on this After Visit Summary.  MyChart is used to connect with patients for Virtual Visits (Telemedicine).  Patients are able to view lab/test results, encounter notes, upcoming appointments, etc.  Non-urgent messages can be sent to your provider as well.   To learn more about what you can do with MyChart, go to  [email or web address].              Signed, Henry Cave, MD  04/26/2024  10:10 AM    [facility]

## 2024-04-26 NOTE — Patient Instructions (Signed)

## 2024-05-08 ENCOUNTER — Other Ambulatory Visit: Payer: Self-pay | Admitting: Internal Medicine

## 2024-05-08 ENCOUNTER — Other Ambulatory Visit: Payer: Self-pay | Admitting: Cardiology

## 2024-05-08 DIAGNOSIS — F5101 Primary insomnia: Secondary | ICD-10-CM

## 2024-05-10 ENCOUNTER — Encounter: Payer: Self-pay | Admitting: Internal Medicine

## 2024-05-10 ENCOUNTER — Other Ambulatory Visit: Payer: Self-pay

## 2024-05-10 ENCOUNTER — Ambulatory Visit: Payer: PRIVATE HEALTH INSURANCE | Attending: Cardiology

## 2024-05-10 DIAGNOSIS — Z5181 Encounter for therapeutic drug level monitoring: Secondary | ICD-10-CM

## 2024-05-10 DIAGNOSIS — Z952 Presence of prosthetic heart valve: Secondary | ICD-10-CM

## 2024-05-10 LAB — POCT INR: INR: 2.7 (ref 2.0–3.0)

## 2024-05-10 NOTE — Patient Instructions (Signed)
 Continue warfarin 1.5 tablets daily except 1 tablet Mondays and Fridays.  Right toe surgery 05/26/24;  Clearance to be Faxed:  [phone] Recheck INR in 6 weeks  [phone]

## 2024-05-11 ENCOUNTER — Ambulatory Visit: Payer: PRIVATE HEALTH INSURANCE | Admitting: Pharmacist

## 2024-05-11 ENCOUNTER — Telehealth: Payer: Self-pay | Admitting: Pharmacist

## 2024-05-11 NOTE — Telephone Encounter (Signed)
 Left message for surgeon office that we will need a clearance request faxed today to us  ASAP at [phone]. Procedure is 05/26/24

## 2024-05-11 NOTE — Telephone Encounter (Signed)
 Pre-op team,   Will you please contact [facility] to request official clearance request? Surgery scheduled for 1/2.   Thank you!  DW

## 2024-05-11 NOTE — Telephone Encounter (Signed)
 Patient having surgery with [facility] on 1/2. He has asked them to send clearance form, but we have still not received. He has hx of mechanical AVR. Dr. Budd Kindle wants him to be bridged. I will provide him instructions. Pre-op team- can you reach out to [facility] to get clearance form?

## 2024-05-12 ENCOUNTER — Telehealth: Payer: Self-pay | Admitting: Pharmacist

## 2024-05-12 ENCOUNTER — Telehealth: Payer: Self-pay | Admitting: Cardiology

## 2024-05-12 DIAGNOSIS — Z8679 Personal history of other diseases of the circulatory system: Secondary | ICD-10-CM

## 2024-05-12 DIAGNOSIS — Z952 Presence of prosthetic heart valve: Secondary | ICD-10-CM

## 2024-05-12 MED ORDER — ENOXAPARIN SODIUM 150 MG/ML IJ SOSY: 150.0000 mg | PREFILLED_SYRINGE | INTRAMUSCULAR | 0 refills | Status: DC

## 2024-05-12 NOTE — Telephone Encounter (Signed)
 See clearance notes in chart.

## 2024-05-12 NOTE — Telephone Encounter (Signed)
" ° °  Pre-operative Risk Assessment    Patient Name: Henry Duncan  DOB: 1991/03/20 MRN: [id number]      Request for Surgical Clearance    Procedure:  Cheilectomy of the right great toe   Date of Surgery:  Clearance 05/26/24                                 Surgeon: Dr. Silva Surgeon's Group or Practice Name: [facility]  Phone number: [phone] Fax number: [phone]   Type of Clearance Requested:   - Medical  - Pharmacy:  Hold        Type of Anesthesia:  General    Additional requests/questions:     SignedWillie Daring   05/12/2024, 10:16 AM   "

## 2024-05-12 NOTE — Telephone Encounter (Signed)
 Patient with diagnosis of AVR on warfarin for anticoagulation.    Procedure: Cheilectomy of the right great toe  Date of procedure: 05/26/24   CrCl 145 ml/min Platelet count 224K  Per office protocol, patient can hold warfarin for 5 days prior to procedure.    Patient WILL need bridging with Lovenox  (enoxaparin ) around procedure.  **This guidance is not considered finalized until pre-operative APP has relayed final recommendations.**

## 2024-05-12 NOTE — Telephone Encounter (Signed)
 Contacted patient to let him know Lovenox  bridge instructions. Sent instructions to myChart as well.   12/27: Last dose of warfarin.  12/28: No warfarin or enoxaparin  (Lovenox ).  12/29: Inject enoxaparin  150mg  in the fatty abdominal tissue at least 2 inches from the belly button once daily in the morning.No warfarin.  12/30: Inject enoxaparin  in the fatty tissue every 24 hours. No warfarin.  12/31: Inject enoxaparin  in the fatty tissue every 24 hours. No warfarin.  1/1: Inject enoxaparin  in the fatty tissue in the morning at 8 am (No PM dose). No warfarin.  1/2: Procedure Day - No enoxaparin  - Resume warfarin in the evening or as directed by doctor (take an extra half tablet with usual dose for 2 days then resume normal dose).  1/3: Resume enoxaparin  inject in the fatty tissue every 24 hours and take warfarin  1/4: Inject enoxaparin  in the fatty tissue every 24 hours and take warfarin  1/5: Inject enoxaparin  in the fatty tissue every 24 hours and take warfarin  1/6: Inject enoxaparin  in the fatty tissue every 24 hours and take warfarin  1/7: Inject enoxaparin  in the fatty tissue every 24 hours and take warfarin. Keep INR appt.

## 2024-05-12 NOTE — Telephone Encounter (Signed)
 2nd attempt to reach the surgery scheduler that we need a clearance request to be faxed ASAP today as pt procedure is 06/13/24 and the pt is on Warfarin.   Left message to fax clearance request today to fax# [phone] attn: preop team.

## 2024-05-15 NOTE — Telephone Encounter (Addendum)
"  ° °  Patient Name: Henry Duncan  DOB: 09/12/1990 MRN: [id number]  Primary Cardiologist: Redell Cave, MD  Chart reviewed as part of pre-operative protocol coverage. Dr. Cave has reviewed the chart and states, Okay for procedure from a cardiac perspective. Hold warfarin 5 days prior to procedure, bridge with Lovenox  when holding warfarin. Pharmacist Chris Pavero has reached out to the patient with these bridging instructions in a separate telephone encounter:  12/27: Last dose of warfarin.   12/28: No warfarin or enoxaparin  (Lovenox ).   12/29: Inject enoxaparin  150mg  in the fatty abdominal tissue at least 2 inches from the belly button once daily in the morning.No warfarin.   12/30: Inject enoxaparin  in the fatty tissue every 24 hours. No warfarin.   12/31: Inject enoxaparin  in the fatty tissue every 24 hours. No warfarin.   1/1: Inject enoxaparin  in the fatty tissue in the morning at 8 am (No PM dose). No warfarin.   1/2: Procedure Day - No enoxaparin  - Resume warfarin in the evening or as directed by doctor (take an extra half tablet with usual dose for 2 days then resume normal dose).   1/3: Resume enoxaparin  inject in the fatty tissue every 24 hours and take warfarin   1/4: Inject enoxaparin  in the fatty tissue every 24 hours and take warfarin   1/5: Inject enoxaparin  in the fatty tissue every 24 hours and take warfarin   1/6: Inject enoxaparin  in the fatty tissue every 24 hours and take warfarin   1/7: Inject enoxaparin  in the fatty tissue every 24 hours and take warfarin. Keep INR appt.    Will route this bundled recommendation to requesting provider via Epic fax function. Please call with questions.   Brailey Buescher N Shaquelle Hernon, PA-C 05/15/2024, 9:16 AM   "

## 2024-05-22 ENCOUNTER — Telehealth: Payer: Self-pay | Admitting: Podiatry

## 2024-05-22 NOTE — Telephone Encounter (Signed)
 DOS- 05/26/2024  CHEILECTOMY RT- 71710  PER CALL RECEIVED FROM INS, PRIOR AUTH FOR CPT CODE 71710 HAS BEEN APPROVED FROM 05/16/2024-08/14/2024. AUTH# [id number]

## 2024-05-26 ENCOUNTER — Encounter: Payer: Self-pay | Admitting: Podiatry

## 2024-05-26 ENCOUNTER — Other Ambulatory Visit: Payer: Self-pay | Admitting: Podiatry

## 2024-05-26 DIAGNOSIS — M205X1 Other deformities of toe(s) (acquired), right foot: Secondary | ICD-10-CM | POA: Diagnosis not present

## 2024-05-26 MED ORDER — HYDROCODONE-ACETAMINOPHEN 5-325 MG PO TABS: 1.0000 | ORAL_TABLET | Freq: Four times a day (QID) | ORAL | 0 refills | Status: AC | PRN

## 2024-05-30 ENCOUNTER — Encounter: Payer: Self-pay | Admitting: Podiatry

## 2024-05-31 ENCOUNTER — Encounter: Payer: PRIVATE HEALTH INSURANCE | Admitting: Podiatry

## 2024-05-31 ENCOUNTER — Ambulatory Visit: Payer: PRIVATE HEALTH INSURANCE

## 2024-05-31 ENCOUNTER — Ambulatory Visit: Payer: PRIVATE HEALTH INSURANCE | Attending: Cardiology

## 2024-05-31 VITALS — Ht 67.0 in | Wt 205.0 lb

## 2024-05-31 DIAGNOSIS — Z5181 Encounter for therapeutic drug level monitoring: Secondary | ICD-10-CM

## 2024-05-31 DIAGNOSIS — Z952 Presence of prosthetic heart valve: Secondary | ICD-10-CM | POA: Diagnosis not present

## 2024-05-31 DIAGNOSIS — M205X1 Other deformities of toe(s) (acquired), right foot: Secondary | ICD-10-CM | POA: Diagnosis not present

## 2024-05-31 LAB — POCT INR: INR: 1.7 — AB (ref 2.0–3.0)

## 2024-05-31 NOTE — Progress Notes (Signed)
"  °  Subjective:  Patient ID: Henry Duncan, male    DOB: 11-04-1990,  MRN: [id number]  Chief Complaint  Patient presents with   Post-op Follow-up    RM 2 POV #1 DOS 05/26/2024 RT CHEILECTOMY. Pt states some moderate pain at night after elevating right foot.     34 y.o. male returns for post-op check.  His pain is well-controlled the foot still feels somewhat numb feels like the block is still active  Review of Systems: Negative except as noted in the HPI. Denies N/V/F/Ch.   Objective:  There were no vitals filed for this visit. Body mass index is 32.11 kg/m. Constitutional Well developed. Well nourished.  Vascular Foot warm and well perfused. Capillary refill normal to all digits.  Calf is soft and supple, no posterior calf or knee pain, negative Homans' sign  Neurologic Normal speech. Oriented to person, place, and time. Epicritic sensation to light touch grossly present bilaterally.  Dermatologic Skin healing well without signs of infection. Skin edges well coapted without signs of infection.  Orthopedic: Tenderness to palpation noted about the surgical site.  Moderate edema   Multiple view plain film radiographs: Good resection of spur noted Assessment:   1. Hallux limitus, right    Plan:  Patient was evaluated and treated and all questions answered.  S/p foot surgery right -Progressing as expected post-operatively.  May begin transitioning back to regular shoe gear as tolerated in 1 to 2 weeks.  Return in 2 weeks to have suture removed.  Okay to bathe foot.  Once comfortable walking in regular shoe okay to drive.  Block effects should continue to wear off. -  No follow-ups on file.  "

## 2024-05-31 NOTE — Patient Instructions (Signed)
 Take 2 tablets today and Thursday then Continue warfarin 1.5 tablets daily except 1 tablet Mondays and Fridays. No greens until Saturday. Recheck INR in 3 weeks  [phone]

## 2024-06-05 ENCOUNTER — Other Ambulatory Visit: Payer: Self-pay | Admitting: Physician Assistant

## 2024-06-06 ENCOUNTER — Other Ambulatory Visit: Payer: Self-pay | Admitting: Physician Assistant

## 2024-06-06 DIAGNOSIS — R0789 Other chest pain: Secondary | ICD-10-CM

## 2024-06-06 DIAGNOSIS — F411 Generalized anxiety disorder: Secondary | ICD-10-CM

## 2024-06-06 MED ORDER — GABAPENTIN 100 MG PO CAPS: 200.0000 mg | ORAL_CAPSULE | Freq: Every day | ORAL | 0 refills | Status: DC

## 2024-06-06 MED ORDER — ALPRAZOLAM 0.5 MG PO TABS: 0.5000 mg | ORAL_TABLET | Freq: Two times a day (BID) | ORAL | 0 refills | Status: AC | PRN

## 2024-06-14 ENCOUNTER — Ambulatory Visit (INDEPENDENT_AMBULATORY_CARE_PROVIDER_SITE_OTHER): Payer: PRIVATE HEALTH INSURANCE | Admitting: Podiatry

## 2024-06-14 VITALS — Ht 67.0 in | Wt 205.0 lb

## 2024-06-14 DIAGNOSIS — M205X1 Other deformities of toe(s) (acquired), right foot: Secondary | ICD-10-CM

## 2024-06-14 NOTE — Progress Notes (Signed)
"  °  Subjective:  Patient ID: Henry Duncan, male    DOB: 12-24-1990,  MRN: [id number]  Chief Complaint  Patient presents with   Post-op Follow-up    RM 2 POV #2 DOS 05/26/2024 DOS RT CHEILECTOMY. Suture removed, continued bruising on the right 1st-3rd toes. Pt states some soreness in the right 1st toe.     34 y.o. male returns for post-op check.  Overall doing fairly well pain is improving  Review of Systems: Negative except as noted in the HPI. Denies N/V/F/Ch.   Objective:  There were no vitals filed for this visit. Body mass index is 32.11 kg/m. Constitutional Well developed. Well nourished.  Vascular Foot warm and well perfused. Capillary refill normal to all digits.  Calf is soft and supple, no posterior calf or knee pain, negative Homans' sign  Neurologic Normal speech. Oriented to person, place, and time. Epicritic sensation to light touch grossly present bilaterally.  Dermatologic Incisions well-healed on hypertrophic  Orthopedic: Edema improved.  Only mild now.  Tenderness improved.   Multiple view plain film radiographs: Good resection of spur noted Assessment:   1. Hallux limitus, right    Plan:  Patient was evaluated and treated and all questions answered.  S/p foot surgery right Improving.  Encourage range of motion of the digit.  Activity and shoe gear as tolerated.  Return in 4 months to reevaluate. -  Return in about 4 months (around 10/12/2024) for surgery f/u new xrays.  "

## 2024-06-21 ENCOUNTER — Ambulatory Visit: Payer: PRIVATE HEALTH INSURANCE

## 2024-06-21 DIAGNOSIS — Z952 Presence of prosthetic heart valve: Secondary | ICD-10-CM | POA: Diagnosis not present

## 2024-06-21 DIAGNOSIS — Z5181 Encounter for therapeutic drug level monitoring: Secondary | ICD-10-CM | POA: Diagnosis not present

## 2024-06-21 LAB — POCT INR: INR: 3.4 — AB (ref 2.0–3.0)

## 2024-06-21 NOTE — Patient Instructions (Signed)
 Continue warfarin 1.5 tablets daily except 1 tablet Mondays and Fridays. Eat greens in the next few days. Recheck INR in 4 weeks  [phone]

## 2024-06-26 ENCOUNTER — Ambulatory Visit: Payer: PRIVATE HEALTH INSURANCE | Admitting: Pharmacist

## 2024-06-30 ENCOUNTER — Ambulatory Visit: Payer: PRIVATE HEALTH INSURANCE | Admitting: Physician Assistant

## 2024-06-30 VITALS — BP 130/80 | HR 82 | Temp 98.1°F | Ht 67.0 in | Wt 203.5 lb

## 2024-06-30 DIAGNOSIS — R0789 Other chest pain: Secondary | ICD-10-CM

## 2024-06-30 DIAGNOSIS — F411 Generalized anxiety disorder: Secondary | ICD-10-CM

## 2024-06-30 DIAGNOSIS — R7989 Other specified abnormal findings of blood chemistry: Secondary | ICD-10-CM

## 2024-06-30 DIAGNOSIS — Z87898 Personal history of other specified conditions: Secondary | ICD-10-CM

## 2024-06-30 MED ORDER — GABAPENTIN 100 MG PO CAPS: 200.0000 mg | ORAL_CAPSULE | Freq: Every day | ORAL | 1 refills | Status: AC | PRN

## 2024-06-30 MED ORDER — ONDANSETRON 4 MG PO TBDP: 4.0000 mg | ORAL_TABLET | Freq: Three times a day (TID) | ORAL | 0 refills | Status: AC | PRN

## 2024-06-30 MED ORDER — BUSPIRONE HCL 5 MG PO TABS: 5.0000 mg | ORAL_TABLET | Freq: Two times a day (BID) | ORAL | 1 refills | Status: AC

## 2024-07-05 ENCOUNTER — Encounter: Payer: PRIVATE HEALTH INSURANCE | Admitting: Podiatry

## 2024-07-19 ENCOUNTER — Ambulatory Visit: Payer: PRIVATE HEALTH INSURANCE

## 2024-10-11 ENCOUNTER — Ambulatory Visit: Payer: PRIVATE HEALTH INSURANCE | Admitting: Podiatry

## 2024-10-13 ENCOUNTER — Encounter: Payer: PRIVATE HEALTH INSURANCE | Admitting: Physician Assistant
